# Patient Record
Sex: Female | Born: 1935 | Race: White | Hispanic: No | Smoking: Never smoker
Health system: Southern US, Community
[De-identification: ages and names within clinical notes are randomized; demographics above are authoritative.]

## PROBLEM LIST (undated history)

## (undated) DIAGNOSIS — N289 Disorder of kidney and ureter, unspecified: Secondary | ICD-10-CM

## (undated) DIAGNOSIS — M419 Scoliosis, unspecified: Secondary | ICD-10-CM

## (undated) DIAGNOSIS — Z4689 Encounter for fitting and adjustment of other specified devices: Secondary | ICD-10-CM

## (undated) DIAGNOSIS — D509 Iron deficiency anemia, unspecified: Secondary | ICD-10-CM

## (undated) DIAGNOSIS — E7800 Pure hypercholesterolemia, unspecified: Secondary | ICD-10-CM

## (undated) DIAGNOSIS — E785 Hyperlipidemia, unspecified: Secondary | ICD-10-CM

## (undated) DIAGNOSIS — N816 Rectocele: Secondary | ICD-10-CM

## (undated) DIAGNOSIS — K649 Unspecified hemorrhoids: Secondary | ICD-10-CM

## (undated) DIAGNOSIS — M810 Age-related osteoporosis without current pathological fracture: Secondary | ICD-10-CM

## (undated) DIAGNOSIS — K8020 Calculus of gallbladder without cholecystitis without obstruction: Secondary | ICD-10-CM

## (undated) DIAGNOSIS — I499 Cardiac arrhythmia, unspecified: Secondary | ICD-10-CM

## (undated) DIAGNOSIS — K449 Diaphragmatic hernia without obstruction or gangrene: Secondary | ICD-10-CM

## (undated) DIAGNOSIS — K5790 Diverticulosis of intestine, part unspecified, without perforation or abscess without bleeding: Secondary | ICD-10-CM

## (undated) DIAGNOSIS — M1990 Unspecified osteoarthritis, unspecified site: Secondary | ICD-10-CM

## (undated) DIAGNOSIS — N8110 Cystocele, unspecified: Secondary | ICD-10-CM

## (undated) DIAGNOSIS — E039 Hypothyroidism, unspecified: Secondary | ICD-10-CM

## (undated) DIAGNOSIS — H269 Unspecified cataract: Secondary | ICD-10-CM

## (undated) DIAGNOSIS — N390 Urinary tract infection, site not specified: Secondary | ICD-10-CM

## (undated) DIAGNOSIS — I4891 Unspecified atrial fibrillation: Secondary | ICD-10-CM

## (undated) DIAGNOSIS — J189 Pneumonia, unspecified organism: Secondary | ICD-10-CM

## (undated) DIAGNOSIS — D72829 Elevated white blood cell count, unspecified: Secondary | ICD-10-CM

## (undated) DIAGNOSIS — E875 Hyperkalemia: Secondary | ICD-10-CM

## (undated) DIAGNOSIS — IMO0002 Reserved for concepts with insufficient information to code with codable children: Secondary | ICD-10-CM

## (undated) DIAGNOSIS — M4050 Lordosis, unspecified, site unspecified: Secondary | ICD-10-CM

## (undated) DIAGNOSIS — N189 Chronic kidney disease, unspecified: Secondary | ICD-10-CM

## (undated) HISTORY — DX: Encounter for fitting and adjustment of other specified devices: Z46.89

## (undated) HISTORY — PX: COLONOSCOPY: SHX174

## (undated) HISTORY — DX: Hyperkalemia: E87.5

## (undated) HISTORY — PX: CATARACT EXTRACTION: SUR2

## (undated) HISTORY — DX: Elevated white blood cell count, unspecified: D72.829

## (undated) HISTORY — DX: Rectocele: N81.6

## (undated) HISTORY — DX: Lordosis, unspecified, site unspecified: M40.50

## (undated) HISTORY — DX: Unspecified atrial fibrillation: I48.91

## (undated) HISTORY — PX: ESOPHAGOGASTRODUODENOSCOPY: SHX1529

## (undated) HISTORY — PX: CHOLECYSTECTOMY: SHX55

## (undated) HISTORY — DX: Urinary tract infection, site not specified: N39.0

## (undated) HISTORY — DX: Hyperlipidemia, unspecified: E78.5

## (undated) HISTORY — DX: Reserved for concepts with insufficient information to code with codable children: IMO0002

## (undated) HISTORY — DX: Scoliosis, unspecified: M41.9

---

## 2000-05-11 ENCOUNTER — Encounter (HOSPITAL_COMMUNITY): Admission: RE | Admit: 2000-05-11 | Discharge: 2000-08-09 | Payer: Self-pay | Admitting: Family Medicine

## 2000-06-01 ENCOUNTER — Ambulatory Visit (HOSPITAL_COMMUNITY): Admission: RE | Admit: 2000-06-01 | Discharge: 2000-06-01 | Payer: Self-pay | Admitting: Gastroenterology

## 2000-06-01 ENCOUNTER — Encounter (INDEPENDENT_AMBULATORY_CARE_PROVIDER_SITE_OTHER): Payer: Self-pay | Admitting: Specialist

## 2001-02-16 ENCOUNTER — Encounter: Admission: RE | Admit: 2001-02-16 | Discharge: 2001-02-16 | Payer: Self-pay | Admitting: Family Medicine

## 2001-02-16 ENCOUNTER — Encounter: Payer: Self-pay | Admitting: Family Medicine

## 2001-02-21 ENCOUNTER — Encounter: Payer: Self-pay | Admitting: Family Medicine

## 2001-02-21 ENCOUNTER — Encounter: Admission: RE | Admit: 2001-02-21 | Discharge: 2001-02-21 | Payer: Self-pay | Admitting: Family Medicine

## 2002-01-30 ENCOUNTER — Encounter: Payer: Self-pay | Admitting: Family Medicine

## 2002-01-30 ENCOUNTER — Encounter: Admission: RE | Admit: 2002-01-30 | Discharge: 2002-01-30 | Payer: Self-pay | Admitting: Family Medicine

## 2002-04-03 ENCOUNTER — Other Ambulatory Visit: Admission: RE | Admit: 2002-04-03 | Discharge: 2002-04-03 | Payer: Self-pay | Admitting: Obstetrics and Gynecology

## 2002-04-23 DIAGNOSIS — Z4689 Encounter for fitting and adjustment of other specified devices: Secondary | ICD-10-CM

## 2002-04-23 HISTORY — DX: Encounter for fitting and adjustment of other specified devices: Z46.89

## 2003-03-14 ENCOUNTER — Encounter: Admission: RE | Admit: 2003-03-14 | Discharge: 2003-03-14 | Payer: Self-pay | Admitting: Family Medicine

## 2003-03-14 ENCOUNTER — Encounter: Payer: Self-pay | Admitting: Family Medicine

## 2003-03-29 ENCOUNTER — Observation Stay (HOSPITAL_COMMUNITY): Admission: RE | Admit: 2003-03-29 | Discharge: 2003-03-30 | Payer: Self-pay | Admitting: General Surgery

## 2003-03-29 ENCOUNTER — Encounter: Payer: Self-pay | Admitting: General Surgery

## 2003-03-29 ENCOUNTER — Encounter (INDEPENDENT_AMBULATORY_CARE_PROVIDER_SITE_OTHER): Payer: Self-pay | Admitting: Specialist

## 2004-09-10 ENCOUNTER — Other Ambulatory Visit: Admission: RE | Admit: 2004-09-10 | Discharge: 2004-09-10 | Payer: Self-pay | Admitting: Obstetrics and Gynecology

## 2005-06-14 ENCOUNTER — Other Ambulatory Visit: Admission: RE | Admit: 2005-06-14 | Discharge: 2005-06-14 | Payer: Self-pay | Admitting: Obstetrics and Gynecology

## 2005-07-15 ENCOUNTER — Encounter: Admission: RE | Admit: 2005-07-15 | Discharge: 2005-07-15 | Payer: Self-pay | Admitting: General Surgery

## 2006-01-10 ENCOUNTER — Encounter: Admission: RE | Admit: 2006-01-10 | Discharge: 2006-01-10 | Payer: Self-pay | Admitting: General Surgery

## 2006-06-20 ENCOUNTER — Encounter: Admission: RE | Admit: 2006-06-20 | Discharge: 2006-06-20 | Payer: Self-pay | Admitting: Obstetrics and Gynecology

## 2007-06-22 ENCOUNTER — Encounter: Admission: RE | Admit: 2007-06-22 | Discharge: 2007-06-22 | Payer: Self-pay | Admitting: Obstetrics and Gynecology

## 2007-07-11 ENCOUNTER — Other Ambulatory Visit: Admission: RE | Admit: 2007-07-11 | Discharge: 2007-07-11 | Payer: Self-pay | Admitting: Obstetrics and Gynecology

## 2008-07-29 ENCOUNTER — Encounter: Admission: RE | Admit: 2008-07-29 | Discharge: 2008-07-29 | Payer: Self-pay | Admitting: Obstetrics and Gynecology

## 2009-08-07 ENCOUNTER — Encounter: Admission: RE | Admit: 2009-08-07 | Discharge: 2009-08-07 | Payer: Self-pay | Admitting: Family Medicine

## 2009-09-02 HISTORY — PX: ENDOMETRIAL BIOPSY: SHX622

## 2010-08-28 ENCOUNTER — Telehealth (INDEPENDENT_AMBULATORY_CARE_PROVIDER_SITE_OTHER): Payer: Self-pay | Admitting: *Deleted

## 2010-08-31 ENCOUNTER — Encounter: Payer: Self-pay | Admitting: Internal Medicine

## 2010-08-31 ENCOUNTER — Ambulatory Visit (HOSPITAL_COMMUNITY): Payer: Medicare Other | Attending: Family Medicine

## 2010-08-31 DIAGNOSIS — I491 Atrial premature depolarization: Secondary | ICD-10-CM

## 2010-08-31 DIAGNOSIS — R0609 Other forms of dyspnea: Secondary | ICD-10-CM | POA: Insufficient documentation

## 2010-08-31 DIAGNOSIS — R0602 Shortness of breath: Secondary | ICD-10-CM

## 2010-08-31 DIAGNOSIS — R0989 Other specified symptoms and signs involving the circulatory and respiratory systems: Secondary | ICD-10-CM

## 2010-09-01 NOTE — Progress Notes (Signed)
Summary: Nuclear Pre-Procedure  Phone Note Outgoing Call Call back at [facility] Phone [phone]   Call placed by: Stanton Kidney, EMT-P,  August 28, 2010 11:46 AM Call placed to: Patient Action Taken: Phone Call Completed Summary of Call: Reviewed information on [employer] Information Sheet (see scanned document for further details).  Spoke with the patient. Stanton Kidney, EMT-P  August 28, 2010 11:46 AM      Nuclear Med Background Indications for Stress Test: Evaluation for Ischemia     Symptoms: DOE, Palpitations, Rapid HR, SOB

## 2010-09-10 NOTE — Assessment & Plan Note (Signed)
Summary: Cardiology Nuclear Testing  Nuclear Med Background Indications for Stress Test: Evaluation for Ischemia     Symptoms: DOE, Palpitations, Rapid HR, SOB    Nuclear Pre-Procedure Caffeine/Decaff Intake: None NPO After: 7:00 PM Lungs: clear IV 0.9% NS with Angio Cath: 22g     IV Site: R Wrist IV Started by: Irean Hong, RN Chest Size (in) 40     Cup Size C     Height (in): 63 Weight (lb): 158 BMI: 28.09  Nuclear Med Study 1 or 2 day study:  1 day     Stress Test Type:  Eugenie Birks Reading MD:  Dietrich Pates, MD     Referring MD:  M.Badger Resting Radionuclide:  Technetium 13m Tetrofosmin     Resting Radionuclide Dose:  11.0 mCi  Stress Radionuclide:  Technetium 87m Tetrofosmin     Stress Radionuclide Dose:  33.0 mCi   Stress Protocol  Max Systolic BP: 162 mm Hg Lexiscan: 0.4 mg   Stress Test Technologist:  Milana Na, EMT-P     Nuclear Technologist:  Doyne Keel, CNMT  Rest Procedure  Myocardial perfusion imaging was performed at rest 45 minutes following the intravenous administration of Technetium 26m Tetrofosmin.  Stress Procedure  The patient received IV Lexiscan 0.4 mg over 15-seconds.  Technetium 103m Tetrofosmin injected at 30-seconds.  There were no significant changes with infusion.  Quantitative spect images were obtained after a 45 minute delay.  QPS Raw Data Images:  Soft tissue (breast, diaphragm) surround heart. Stress Images:  Normal perfusion with minimal apical thinning. Rest Images:  No significant change from the stress images. Subtraction (SDS):  No evidence of ischemia. Transient Ischemic Dilatation:  0.96  (Normal <1.22)  Lung/Heart Ratio:  0.40  (Normal <0.45)  Quantitative Gated Spect Images QGS EDV:  62 ml QGS ESV:  15 ml QGS EF:  76 %   Overall Impression  Exercise Capacity: Lexiscan with low level exercise BP Response: Normal blood pressure response. Clinical Symptoms: No chest pain ECG Impression: No significant ST segment  change suggestive of ischemia. Overall Impression: Normal stress nuclear study.

## 2010-09-18 ENCOUNTER — Other Ambulatory Visit: Payer: Self-pay | Admitting: Obstetrics and Gynecology

## 2010-09-18 DIAGNOSIS — Z780 Asymptomatic menopausal state: Secondary | ICD-10-CM

## 2010-09-18 DIAGNOSIS — Z1231 Encounter for screening mammogram for malignant neoplasm of breast: Secondary | ICD-10-CM

## 2010-10-01 ENCOUNTER — Ambulatory Visit
Admission: RE | Admit: 2010-10-01 | Discharge: 2010-10-01 | Disposition: A | Discharge status: Home / Self Care | Payer: Medicare Other | Source: Ambulatory Visit | Attending: Obstetrics and Gynecology | Admitting: Obstetrics and Gynecology

## 2010-10-01 DIAGNOSIS — Z780 Asymptomatic menopausal state: Secondary | ICD-10-CM

## 2010-10-01 DIAGNOSIS — Z1231 Encounter for screening mammogram for malignant neoplasm of breast: Secondary | ICD-10-CM

## 2010-11-20 NOTE — Op Note (Signed)
[facility]  Patient:    Carla [name], Carla [name]                      MRN: [id number] Proc. Date: 06/01/00 Adm. Date:  [id number] Attending:  Valera Castle CC:         Teena Irani. Arlyce Dice, M.D.   Operative Report  PROCEDURE:  Esophagogastroduodenoscopy.  ENDOSCOPIST:  Everardo All. Madilyn Fireman, M.D.  INDICATIONS FOR PROCEDURE:  Severe iron-deficiency anemia with colonoscopy prior to this procedure revealing no obvious source of occult blood loss.  DESCRIPTION OF PROCEDURE:   The patient was placed in the left lateral decubitus position and placed on the pulse monitor with continuous low-flow oxygen delivered by nasal cannula.  She was sedated with 1 mg IV Versed and 10 mg IV Demerol in addition to the 60 mg IV Demerol and 6 mg of IV Versed given for the previous colonoscopy.  The Olympus video endoscope was advanced under direct vision into the oropharynx and esophagus.  The esophagus was of normal caliber but somewhat tortuous wit the squamocolumnar line at 33 cm.  There appeared to be a subtle low esophageal ring with no visible erosions, ulcers, or any evidence of neoplasm. There was no visible esophagitis.  The squamocolumnar line appeared to be at the level of the gastroesophageal junction.  The stomach was entered, and there was a fixed 5 cm hiatal hernia with some linear erosions within the hernia sac and more prominent erosions with some exudate just distal to the hernia.  The intra-abdominal stomach otherwise appeared normal.   Retroflexed view of the cardia confirmed the large hiatal hernia.  There were erosions in the body as mentioned.  The antrum appeared relatively normal proximally, but the distal antrum showed some erythema and granularity.  A CLOtest was obtained.  The pylorus was not deformed and easily allowed passage of the endoscope tip into the duodenum. Both the bulb and second portion were well inspected and appeared to be within normal limits.   Small-bowel biopsies were obtained to rule out celiac disease. The endoscope was then withdrawn, and the patient returned to the recovery room in stable condition.  She tolerated the procedure well, and there were no immediate complications.  IMPRESSION: 1. Large hiatal hernia. 2. Gastritis within the body and within the hiatal hernia sac.  PLAN:  Await CLOtest and small-bowel biopsies. DD:  06/01/00 TD:  06/01/00 Job: [id number]

## 2010-11-20 NOTE — Op Note (Signed)
NAME:  Carla [name], Carla [name] NO.:  [id number]   MEDICAL RECORD NO.:  [id number]                   PATIENT TYPE:  AMB   LOCATION:  DAY                                  FACILITY:  [facility]   PHYSICIAN:  Angelia Mould. Derrell Lolling, M.D.             DATE OF BIRTH:  1922/04/04   DATE OF PROCEDURE:  03/29/2003  DATE OF DISCHARGE:                                 OPERATIVE REPORT   PREOPERATIVE DIAGNOSIS:  Chronic cholecystitis with cholelithiasis.   POSTOPERATIVE DIAGNOSIS:  Chronic cholecystitis with cholelithiasis.   OPERATION:  Laparoscopic cholecystectomy with intraoperative cholangiogram.   SURGEON:  Angelia Mould. Derrell Lolling, M.D.   FIRST ASSISTANT:  Timothy E. Earlene Plater, M.D.   INDICATIONS FOR PROCEDURE:  This is a 77 year old white female in excellent  health. The past two months she has been having intermittent attacks of  nausea, vomiting, and upper abdominal pain. She has been on a diet and has  intentionally lost 40 pounds over the past nine months. She recently had an  ultrasound which shows a thick walled gallbladder and a small amount of  fluid around the gallbladder and mild elevation of her liver function tests.  Her symptoms subsided. I scheduled her for this surgery electively.   FINDINGS:  The gallbladder was chronically inflamed, slightly thick walled.  The liver looked normal. The anatomy of the cystic duct, cystic artery and  common bile duct were normal and conventional pattern. The cholangiogram was  normal, showing normal intrahepatic and extrahepatic bile ducts, no filling  defect, and prompt flow of contrast into the duodenum. The small intestine  and large intestine, stomach and duodenum, and peritoneal surfaces were  normal.   TECHNIQUE:  Following the induction of general endotracheal anesthesia, the  patient's abdomen was prepped and draped in a sterile fashion. 0.5% Marcaine  with epinephrine was used as a local infiltration anesthetic. A  transverse  incision was made at the lower rim of the umbilicus. The fascia was incised  in the midline and the abdominal cavity entered under direct vision. A 10 mm  Hasson trocar was inserted and secured with a pursestring suture of #0  Vicryl. Pneumoperitoneum was created. The video camera was inserted with  visualization and findings as described above. A 10 mm trocar was placed in  the subxiphoid region and two 5 mm trocars placed in the right mid abdomen.  The gallbladder was elevated and placed on traction. We exposed the  infundibulum of the gallbladder. We dissected out the cystic duct and the  cystic artery. The cystic artery was isolated, secured with metal clips and  divided close to where it went onto the gallbladder wall. The cystic duct  was isolated. We could identify the junction of the cystic duct with the  gallbladder and we identified the common bile duct as well. A large window  was created behind the cystic duct. A metal clip was placed on the cystic  duct close to the gallbladder. A cholangiogram catheter was inserted into  the cystic duct. A cholangiogram was obtained using a C-arm. This showed  normal intrahepatic and extrahepatic bile ducts. There was no filling defect  and the dye flowed into the duodenum without any obstruction. The  cholangiogram catheter was removed. The cystic duct was secured with metal  clips and divided. The gallbladder was dissected from its bed with  electrocautery and removed.   The operative field was copiously irrigated with saline. The irrigation  fluid was completely clear at the end of the case. There was no bleeding and  no bile leak whatsoever. The trocars were removed under direct vision and  there was no bleeding from the trocar sites. The pneumoperitoneum was  released. The fascia at the umbilicus was closed with #0 Vicryl sutures. The  skin incisions were closed with subcuticular sutures of 4-0 Vicryl and Steri-  Strips.  Clean bandages were placed and the patient taken to the recovery  room in stable condition. Estimated blood loss was about 10 mL.  Complications none. Sponge, needle and instrument counts were correct.                                               Angelia Mould. Derrell Lolling, M.D.    HMI/MEDQ  D:  03/29/2003  T:  03/30/2003  Job:  [id number]   cc:   Teena Irani. Arlyce Dice, M.D.  [address]  [place]  [place] [address]  Fax: [phone]

## 2010-11-20 NOTE — Op Note (Signed)
[facility]  Patient:    Carla [name], Carla [name]                      MRN: [id number] Proc. Date: 06/01/00 Adm. Date:  [id number] Attending:  Valera Castle CC:         Teena Irani. Arlyce Dice, M.D.   Operative Report  PROCEDURE:  Colonoscopy.  GASTROENTEROLOGIST:  Everardo All. Madilyn Fireman, M.D.  INDICATIONS FOR PROCEDURE:  Severe iron-deficiency anemia with hemoglobin less than 6 requiring transfusion.  DESCRIPTION OF PROCEDURE:   The patient was placed in the left lateral decubitus position and placed on the pulse monitor with continuous low-flow oxygen delivered by nasal cannula.  She was sedated with 60 mg IV Demerol and 6 mg IV Versed.  The Olympus video colonoscope was inserted into the rectum and advanced to the cecum, confirmed by transillumination of McBurneys point and visualization of the ileocecal valve and appendiceal orifice.  The prep was excellent.  The cecum appeared normal.  In the ascending, transverse, descending, and sigmoid colon was seen extensive diverticulosis, some quite large, with no other abnormalities noted.  No polyps or mucosal abnormalities were seen.  The rectum appeared normal except for some enlarged internal hemorrhoids seen on retroflex view.  The colonoscope was then withdrawn, and the patient returned to the recovery room in stable condition.  She tolerated the procedure well, and there were no immediate complications.  IMPRESSIONS: 1. Extensive pancolonic diverticulosis. 2. Enlarge internal hemorrhoids.  PLAN:  Will proceed with EGD.  If no significant bleeding lesion found, will pursue small-bowel biopsy to rule out a sprue-like lesion. DD:  06/01/00 TD:  06/01/00 Job: [id number]

## 2011-09-23 DIAGNOSIS — E785 Hyperlipidemia, unspecified: Secondary | ICD-10-CM | POA: Insufficient documentation

## 2011-09-25 ENCOUNTER — Emergency Department (HOSPITAL_COMMUNITY)
Admission: EM | Admit: 2011-09-25 | Discharge: 2011-09-25 | Disposition: A | Discharge status: Home / Self Care | Payer: Medicare Other | Attending: Emergency Medicine | Admitting: Emergency Medicine

## 2011-09-25 ENCOUNTER — Encounter (HOSPITAL_COMMUNITY): Payer: Self-pay

## 2011-09-25 DIAGNOSIS — Z0189 Encounter for other specified special examinations: Secondary | ICD-10-CM

## 2011-09-25 DIAGNOSIS — E875 Hyperkalemia: Secondary | ICD-10-CM | POA: Insufficient documentation

## 2011-09-25 DIAGNOSIS — Z8639 Personal history of other endocrine, nutritional and metabolic disease: Secondary | ICD-10-CM

## 2011-09-25 LAB — BASIC METABOLIC PANEL
BUN: 21 mg/dL (ref 6–23)
Creatinine, Ser: 0.96 mg/dL (ref 0.50–1.10)
GFR calc Af Amer: 60 mL/min — ABNORMAL LOW (ref >90)
GFR calc non Af Amer: 52 mL/min — ABNORMAL LOW (ref >90)
Glucose, Bld: 113 mg/dL — ABNORMAL HIGH (ref 70–99)

## 2011-09-25 NOTE — ED Provider Notes (Signed)
History     CSN: [id number]  Arrival date & time 09/25/11  0720   First MD Initiated Contact with Patient 09/25/11 [phone]      No chief complaint on file.   (Consider location/radiation/quality/duration/timing/severity/associated sxs/prior treatment) HPI And patient seen by Dr.Badger office 2 days ago for checkup. Was called this morning, told to come to the emergency department as she was noted to have high potassium Patient is asymptomatic. No treatment prior to coming here. No past medical history on file. Past medical history arthritis No past surgical history on file. Cholecystectomy No family history on file.  History  Substance Use Topics  . Smoking status: Not on file  . Smokeless tobacco: Not on file  . Alcohol Use: Not on file   No tobacco no alcohol no drugs OB History    No data available      Review of Systems  Constitutional: Negative.   HENT: Negative.   Respiratory: Negative.   Cardiovascular: Negative.   Gastrointestinal: Negative.   Musculoskeletal: Negative.   Skin: Negative.   Neurological: Negative.   Hematological: Negative.   Psychiatric/Behavioral: Negative.   All other systems reviewed and are negative.    Allergies  Review of patient's allergies indicates not on file.  Home Medications  No current outpatient prescriptions on file.  There were no vitals taken for this visit.  Physical Exam  Nursing note and vitals reviewed. Constitutional: She appears well-developed and well-nourished. No distress.  HENT:  Head: Normocephalic and atraumatic.  Eyes: Conjunctivae are normal. Pupils are equal, round, and reactive to light.  Neck: Neck supple. No tracheal deviation present. No thyromegaly present.  Cardiovascular: Normal rate and regular rhythm.   No murmur heard. Pulmonary/Chest: Effort normal and breath sounds normal.  Abdominal: Soft. Bowel sounds are normal. She exhibits no distension. There is no tenderness.  Musculoskeletal:  Normal range of motion. She exhibits no edema and no tenderness.  Neurological: She is alert. Coordination normal.  Skin: Skin is warm and dry. No rash noted.  Psychiatric: She has a normal mood and affect.    ED Course  Procedures (including critical care time)  Labs Reviewed - No data to display No results found. Cardiac monitor shows sinus arrhythmia. Twaves normal appearing  No diagnosis found. Results for orders placed during the hospital encounter of 09/25/11  BASIC METABOLIC PANEL      Component Value Range   Sodium 137  135 - 145 (mEq/L)   Potassium 4.1  3.5 - 5.1 (mEq/L)   Chloride 100  96 - 112 (mEq/L)   CO2 27  19 - 32 (mEq/L)   Glucose, Bld 113 (*) 70 - 99 (mg/dL)   BUN 21  6 - 23 (mg/dL)   Creatinine, Ser 4.78  0.50 - 1.10 (mg/dL)   Calcium 9.1  8.4 - 29.5 (mg/dL)   GFR calc non Af Amer 52 (*) >90 (mL/min)   GFR calc Af Amer 60 (*) >90 (mL/min)   No results found.   9 AM patient remains asymptomatic resting comfortably MDM  Elevated K at pmds office likely factitious No further tx  Or eval needed   Diagnosis:   Hx of hyperkalemia   Doug Sou, MD 09/25/11 [phone]

## 2011-09-25 NOTE — Discharge Instructions (Signed)
Your blood potassium collected here today was 4.1, which is normal. Return if your condition worsens for any reason. Please notify Dr. Cyndia Bent of today's result

## 2011-09-25 NOTE — ED Notes (Signed)
Pt was referred here by PCP for hyperkalemia per pcp lab value. Sts it was 6.6

## 2011-09-30 DIAGNOSIS — D649 Anemia, unspecified: Secondary | ICD-10-CM | POA: Insufficient documentation

## 2012-01-19 DIAGNOSIS — M549 Dorsalgia, unspecified: Secondary | ICD-10-CM | POA: Insufficient documentation

## 2012-01-19 DIAGNOSIS — M419 Scoliosis, unspecified: Secondary | ICD-10-CM | POA: Insufficient documentation

## 2012-05-02 LAB — HM PAP SMEAR: HM Pap smear: NEGATIVE

## 2012-08-10 ENCOUNTER — Other Ambulatory Visit: Payer: Self-pay | Admitting: Obstetrics and Gynecology

## 2012-08-10 DIAGNOSIS — Z1231 Encounter for screening mammogram for malignant neoplasm of breast: Secondary | ICD-10-CM

## 2012-08-19 ENCOUNTER — Inpatient Hospital Stay (HOSPITAL_COMMUNITY): Payer: Medicare Other

## 2012-08-19 ENCOUNTER — Encounter (HOSPITAL_COMMUNITY): Payer: Self-pay | Admitting: Emergency Medicine

## 2012-08-19 ENCOUNTER — Emergency Department (HOSPITAL_COMMUNITY): Payer: Medicare Other

## 2012-08-19 ENCOUNTER — Inpatient Hospital Stay (HOSPITAL_COMMUNITY)
Admission: EM | Admit: 2012-08-19 | Discharge: 2012-08-27 | DRG: 871 | Disposition: A | Discharge status: Home / Self Care | Payer: Medicare Other | Attending: Internal Medicine | Admitting: Internal Medicine

## 2012-08-19 DIAGNOSIS — K529 Noninfective gastroenteritis and colitis, unspecified: Secondary | ICD-10-CM

## 2012-08-19 DIAGNOSIS — K449 Diaphragmatic hernia without obstruction or gangrene: Secondary | ICD-10-CM | POA: Diagnosis present

## 2012-08-19 DIAGNOSIS — I5031 Acute diastolic (congestive) heart failure: Secondary | ICD-10-CM | POA: Diagnosis present

## 2012-08-19 DIAGNOSIS — I959 Hypotension, unspecified: Secondary | ICD-10-CM

## 2012-08-19 DIAGNOSIS — K5730 Diverticulosis of large intestine without perforation or abscess without bleeding: Secondary | ICD-10-CM

## 2012-08-19 DIAGNOSIS — R1115 Cyclical vomiting syndrome unrelated to migraine: Secondary | ICD-10-CM

## 2012-08-19 DIAGNOSIS — N179 Acute kidney failure, unspecified: Secondary | ICD-10-CM

## 2012-08-19 DIAGNOSIS — R7401 Elevation of levels of liver transaminase levels: Secondary | ICD-10-CM | POA: Diagnosis present

## 2012-08-19 DIAGNOSIS — E7800 Pure hypercholesterolemia, unspecified: Secondary | ICD-10-CM

## 2012-08-19 DIAGNOSIS — I4891 Unspecified atrial fibrillation: Secondary | ICD-10-CM | POA: Diagnosis present

## 2012-08-19 DIAGNOSIS — J189 Pneumonia, unspecified organism: Secondary | ICD-10-CM | POA: Diagnosis present

## 2012-08-19 DIAGNOSIS — N390 Urinary tract infection, site not specified: Secondary | ICD-10-CM | POA: Diagnosis present

## 2012-08-19 DIAGNOSIS — R571 Hypovolemic shock: Secondary | ICD-10-CM

## 2012-08-19 DIAGNOSIS — R0902 Hypoxemia: Secondary | ICD-10-CM | POA: Diagnosis present

## 2012-08-19 DIAGNOSIS — M1990 Unspecified osteoarthritis, unspecified site: Secondary | ICD-10-CM | POA: Insufficient documentation

## 2012-08-19 DIAGNOSIS — B3789 Other sites of candidiasis: Secondary | ICD-10-CM | POA: Diagnosis present

## 2012-08-19 DIAGNOSIS — A4150 Gram-negative sepsis, unspecified: Secondary | ICD-10-CM

## 2012-08-19 DIAGNOSIS — I509 Heart failure, unspecified: Secondary | ICD-10-CM | POA: Diagnosis present

## 2012-08-19 DIAGNOSIS — I951 Orthostatic hypotension: Secondary | ICD-10-CM

## 2012-08-19 DIAGNOSIS — R748 Abnormal levels of other serum enzymes: Secondary | ICD-10-CM | POA: Diagnosis present

## 2012-08-19 DIAGNOSIS — J9600 Acute respiratory failure, unspecified whether with hypoxia or hypercapnia: Secondary | ICD-10-CM | POA: Diagnosis present

## 2012-08-19 DIAGNOSIS — R112 Nausea with vomiting, unspecified: Secondary | ICD-10-CM

## 2012-08-19 DIAGNOSIS — E860 Dehydration: Secondary | ICD-10-CM

## 2012-08-19 DIAGNOSIS — K920 Hematemesis: Secondary | ICD-10-CM

## 2012-08-19 DIAGNOSIS — R7989 Other specified abnormal findings of blood chemistry: Secondary | ICD-10-CM

## 2012-08-19 DIAGNOSIS — K5790 Diverticulosis of intestine, part unspecified, without perforation or abscess without bleeding: Secondary | ICD-10-CM | POA: Insufficient documentation

## 2012-08-19 DIAGNOSIS — R7402 Elevation of levels of lactic acid dehydrogenase (LDH): Secondary | ICD-10-CM | POA: Diagnosis present

## 2012-08-19 DIAGNOSIS — A4151 Sepsis due to Escherichia coli [E. coli]: Principal | ICD-10-CM | POA: Diagnosis present

## 2012-08-19 DIAGNOSIS — R6520 Severe sepsis without septic shock: Secondary | ICD-10-CM | POA: Diagnosis present

## 2012-08-19 DIAGNOSIS — E669 Obesity, unspecified: Secondary | ICD-10-CM | POA: Diagnosis present

## 2012-08-19 DIAGNOSIS — D509 Iron deficiency anemia, unspecified: Secondary | ICD-10-CM | POA: Diagnosis present

## 2012-08-19 DIAGNOSIS — A419 Sepsis, unspecified organism: Secondary | ICD-10-CM | POA: Diagnosis present

## 2012-08-19 DIAGNOSIS — M810 Age-related osteoporosis without current pathological fracture: Secondary | ICD-10-CM | POA: Insufficient documentation

## 2012-08-19 DIAGNOSIS — K5289 Other specified noninfective gastroenteritis and colitis: Secondary | ICD-10-CM

## 2012-08-19 DIAGNOSIS — R5381 Other malaise: Secondary | ICD-10-CM | POA: Diagnosis present

## 2012-08-19 DIAGNOSIS — E876 Hypokalemia: Secondary | ICD-10-CM

## 2012-08-19 DIAGNOSIS — K649 Unspecified hemorrhoids: Secondary | ICD-10-CM | POA: Diagnosis present

## 2012-08-19 DIAGNOSIS — R578 Other shock: Secondary | ICD-10-CM

## 2012-08-19 DIAGNOSIS — D72829 Elevated white blood cell count, unspecified: Secondary | ICD-10-CM | POA: Diagnosis present

## 2012-08-19 DIAGNOSIS — N8110 Cystocele, unspecified: Secondary | ICD-10-CM

## 2012-08-19 DIAGNOSIS — Z6831 Body mass index (BMI) 31.0-31.9, adult: Secondary | ICD-10-CM

## 2012-08-19 HISTORY — DX: Unspecified hemorrhoids: K64.9

## 2012-08-19 HISTORY — DX: Diaphragmatic hernia without obstruction or gangrene: K44.9

## 2012-08-19 HISTORY — DX: Iron deficiency anemia, unspecified: D50.9

## 2012-08-19 HISTORY — DX: Pure hypercholesterolemia, unspecified: E78.00

## 2012-08-19 HISTORY — DX: Diverticulosis of intestine, part unspecified, without perforation or abscess without bleeding: K57.90

## 2012-08-19 HISTORY — DX: Calculus of gallbladder without cholecystitis without obstruction: K80.20

## 2012-08-19 HISTORY — DX: Age-related osteoporosis without current pathological fracture: M81.0

## 2012-08-19 HISTORY — DX: Unspecified cataract: H26.9

## 2012-08-19 HISTORY — DX: Cystocele, unspecified: N81.10

## 2012-08-19 HISTORY — DX: Unspecified osteoarthritis, unspecified site: M19.90

## 2012-08-19 LAB — URINALYSIS, ROUTINE W REFLEX MICROSCOPIC
Protein, ur: 30 mg/dL — AB
Urobilinogen, UA: 1 mg/dL (ref 0.0–1.0)

## 2012-08-19 LAB — URINE MICROSCOPIC-ADD ON

## 2012-08-19 LAB — CBC
Hemoglobin: 13.5 g/dL (ref 12.0–15.0)
MCH: 30.1 pg (ref 26.0–34.0)
MCH: 29.1 pg (ref 26.0–34.0)
MCHC: 32.8 g/dL (ref 30.0–36.0)
MCV: 88.6 fL (ref 78.0–100.0)
Platelets: 332 10*3/uL (ref 150–400)
RBC: 5.54 MIL/uL — ABNORMAL HIGH (ref 3.87–5.11)
RDW: 14.5 % (ref 11.5–15.5)
WBC: 27.6 10*3/uL — ABNORMAL HIGH (ref 4.0–10.5)

## 2012-08-19 LAB — BASIC METABOLIC PANEL
BUN: 73 mg/dL — ABNORMAL HIGH (ref 6–23)
CO2: 26 mEq/L (ref 19–32)
Calcium: 7.6 mg/dL — ABNORMAL LOW (ref 8.4–10.5)
Chloride: 95 mEq/L — ABNORMAL LOW (ref 96–112)
Creatinine, Ser: 3.59 mg/dL — ABNORMAL HIGH (ref 0.50–1.10)
Glucose, Bld: 153 mg/dL — ABNORMAL HIGH (ref 70–99)

## 2012-08-19 LAB — COMPREHENSIVE METABOLIC PANEL
ALT: 20 U/L (ref 0–35)
AST: 39 U/L — ABNORMAL HIGH (ref 0–37)
Albumin: 3.4 g/dL — ABNORMAL LOW (ref 3.5–5.2)
CO2: 24 mEq/L (ref 19–32)
Calcium: 9.3 mg/dL (ref 8.4–10.5)
Creatinine, Ser: 3.85 mg/dL — ABNORMAL HIGH (ref 0.50–1.10)
GFR calc non Af Amer: 10 mL/min — ABNORMAL LOW (ref >90)
Sodium: 138 mEq/L (ref 135–145)

## 2012-08-19 LAB — DIFFERENTIAL
Basophils Relative: 0 % (ref 0–1)
Eosinophils Absolute: 0 10*3/uL (ref 0.0–0.7)
Lymphocytes Relative: 6 % — ABNORMAL LOW (ref 12–46)
Lymphs Abs: 1.7 10*3/uL (ref 0.7–4.0)
Monocytes Relative: 12 % (ref 3–12)
Neutro Abs: 22.6 10*3/uL — ABNORMAL HIGH (ref 1.7–7.7)

## 2012-08-19 LAB — OCCULT BLOOD GASTRIC / DUODENUM (SPECIMEN CUP)
Occult Blood, Gastric: POSITIVE — AB
pH, Gastric: 4

## 2012-08-19 LAB — PROCALCITONIN: Procalcitonin: 1.2 ng/mL

## 2012-08-19 LAB — TROPONIN I
Troponin I: 0.47 ng/mL (ref <0.30)
Troponin I: 0.34 ng/mL (ref <0.30)

## 2012-08-19 LAB — LACTIC ACID, PLASMA
Lactic Acid, Venous: 6.1 mmol/L — ABNORMAL HIGH (ref 0.5–2.2)
Lactic Acid, Venous: 1.6 mmol/L (ref 0.5–2.2)

## 2012-08-19 MED ORDER — POTASSIUM CHLORIDE 10 MEQ/100ML IV SOLN
10.0000 meq | INTRAVENOUS | Status: AC
  Administered 2012-08-19 (×6): 10 meq via INTRAVENOUS
  Filled 2012-08-19: qty 500
  Filled 2012-08-19: qty 100

## 2012-08-19 MED ORDER — ESTROGENS, CONJUGATED 0.625 MG/GM VA CREA: 1.0000 g | TOPICAL_CREAM | VAGINAL | Status: DC

## 2012-08-19 MED ORDER — BIOTENE DRY MOUTH MT LIQD
15.0000 mL | Freq: Two times a day (BID) | OROMUCOSAL | Status: DC
  Administered 2012-08-20 – 2012-08-27 (×13): 15 mL via OROMUCOSAL

## 2012-08-19 MED ORDER — SODIUM CHLORIDE 0.9 % IJ SOLN
3.0000 mL | Freq: Two times a day (BID) | INTRAMUSCULAR | Status: DC
  Administered 2012-08-20 – 2012-08-21 (×3): 3 mL via INTRAVENOUS
  Administered 2012-08-21: 21:00:00 via INTRAVENOUS
  Administered 2012-08-22 – 2012-08-27 (×5): 3 mL via INTRAVENOUS

## 2012-08-19 MED ORDER — SODIUM CHLORIDE 0.9 % IV BOLUS (SEPSIS)
1000.0000 mL | Freq: Once | INTRAVENOUS | Status: AC
  Administered 2012-08-19: 1000 mL via INTRAVENOUS

## 2012-08-19 MED ORDER — IOHEXOL 300 MG/ML  SOLN
50.0000 mL | Freq: Once | INTRAMUSCULAR | Status: AC | PRN
  Administered 2012-08-19: 50 mL via ORAL

## 2012-08-19 MED ORDER — SODIUM CHLORIDE 0.9 % IV SOLN
8.0000 mg/h | INTRAVENOUS | Status: DC
  Administered 2012-08-19 – 2012-08-21 (×4): 8 mg/h via INTRAVENOUS
  Filled 2012-08-19 (×9): qty 80

## 2012-08-19 MED ORDER — ONDANSETRON HCL 4 MG/2ML IJ SOLN
4.0000 mg | Freq: Once | INTRAMUSCULAR | Status: AC
  Administered 2012-08-19: 4 mg via INTRAVENOUS
  Filled 2012-08-19: qty 2

## 2012-08-19 MED ORDER — MORPHINE SULFATE 2 MG/ML IJ SOLN
1.0000 mg | INTRAMUSCULAR | Status: DC | PRN
  Administered 2012-08-19: 2 mg via INTRAVENOUS
  Administered 2012-08-20: 1 mg via INTRAVENOUS
  Filled 2012-08-19 (×2): qty 1

## 2012-08-19 MED ORDER — ESTRADIOL 0.1 MG/GM VA CREA
1.0000 | TOPICAL_CREAM | VAGINAL | Status: DC
  Administered 2012-08-21 – 2012-08-25 (×3): 1 via VAGINAL
  Filled 2012-08-19: qty 42.5

## 2012-08-19 MED ORDER — PIPERACILLIN-TAZOBACTAM IN DEX 2-0.25 GM/50ML IV SOLN
2.2500 g | Freq: Three times a day (TID) | INTRAVENOUS | Status: DC
  Administered 2012-08-19 – 2012-08-23 (×12): 2.25 g via INTRAVENOUS
  Filled 2012-08-19 (×14): qty 50

## 2012-08-19 MED ORDER — PIPERACILLIN-TAZOBACTAM 3.375 G IVPB
3.3750 g | Freq: Once | INTRAVENOUS | Status: DC
  Filled 2012-08-19: qty 50

## 2012-08-19 MED ORDER — SODIUM CHLORIDE 0.9 % IV SOLN
80.0000 mg | Freq: Once | INTRAVENOUS | Status: AC
  Administered 2012-08-19: 80 mg via INTRAVENOUS
  Filled 2012-08-19: qty 80

## 2012-08-19 MED ORDER — FENTANYL CITRATE 0.05 MG/ML IJ SOLN
12.5000 ug | Freq: Once | INTRAMUSCULAR | Status: AC
  Administered 2012-08-19: 12.5 ug via INTRAVENOUS
  Filled 2012-08-19: qty 2

## 2012-08-19 MED ORDER — PROMETHAZINE HCL 25 MG/ML IJ SOLN
12.5000 mg | Freq: Four times a day (QID) | INTRAMUSCULAR | Status: DC | PRN
  Administered 2012-08-20: 12.5 mg via INTRAVENOUS
  Filled 2012-08-19: qty 1

## 2012-08-19 MED ORDER — ACETAMINOPHEN 325 MG PO TABS
650.0000 mg | ORAL_TABLET | Freq: Four times a day (QID) | ORAL | Status: DC | PRN
  Administered 2012-08-26: 650 mg via ORAL
  Filled 2012-08-19: qty 2

## 2012-08-19 MED ORDER — DILTIAZEM LOAD VIA INFUSION: 10.0000 mg | Freq: Once | INTRAVENOUS | Status: DC

## 2012-08-19 MED ORDER — DILTIAZEM HCL 100 MG IV SOLR: 5.0000 mg/h | INTRAVENOUS | Status: DC

## 2012-08-19 MED ORDER — METOCLOPRAMIDE HCL 5 MG/ML IJ SOLN
10.0000 mg | Freq: Once | INTRAMUSCULAR | Status: AC
  Administered 2012-08-19: 10 mg via INTRAVENOUS
  Filled 2012-08-19: qty 2

## 2012-08-19 MED ORDER — ONDANSETRON 8 MG/NS 50 ML IVPB
8.0000 mg | Freq: Four times a day (QID) | INTRAVENOUS | Status: DC
  Administered 2012-08-19 – 2012-08-22 (×11): 8 mg via INTRAVENOUS
  Filled 2012-08-19 (×13): qty 8

## 2012-08-19 MED ORDER — ACETAMINOPHEN 650 MG RE SUPP: 650.0000 mg | Freq: Four times a day (QID) | RECTAL | Status: DC | PRN

## 2012-08-19 MED ORDER — SODIUM CHLORIDE 0.9 % IV SOLN
INTRAVENOUS | Status: DC
  Administered 2012-08-19 – 2012-08-20 (×2): via INTRAVENOUS

## 2012-08-19 MED ORDER — SODIUM CHLORIDE 0.9 % IV BOLUS (SEPSIS)
1750.0000 mL | Freq: Once | INTRAVENOUS | Status: AC
  Administered 2012-08-19: 1000 mL via INTRAVENOUS

## 2012-08-19 MED ORDER — SODIUM CHLORIDE 0.9 % IV SOLN
Freq: Once | INTRAVENOUS | Status: AC
  Administered 2012-08-19: 12:00:00 via INTRAVENOUS

## 2012-08-19 NOTE — Progress Notes (Signed)
ANTIBIOTIC CONSULT NOTE - INITIAL  Pharmacy Consult for Zosyn Indication: Diverticulitis  No Known Allergies  Patient Measurements: Height: 5\' 2"  (157.5 cm) Weight: 150 lb 9.2 oz (68.3 kg) IBW/kg (Calculated) : 50.1  Vital Signs: Temp: 97.8 F (36.6 C) (02/15 1124) Temp src: Rectal (02/15 1124) BP: 112/82 mmHg (02/15 1500) Pulse Rate: 125 (02/15 1500) Intake/Output from previous day:   Intake/Output from this shift: Total I/O In: 100 [IV Piggyback:100] Out: -   Labs:  Recent Labs  08/19/12 1034  WBC 27.6*  HGB 16.7*  PLT 332  CREATININE 3.85*   Estimated Creatinine Clearance: 9.2 ml/min (by C-G formula based on Cr of 3.85). No results found for this basename: VANCOTROUGH, VANCOPEAK, VANCORANDOM, GENTTROUGH, GENTPEAK, GENTRANDOM, TOBRATROUGH, TOBRAPEAK, TOBRARND, AMIKACINPEAK, AMIKACINTROU, AMIKACIN,  in the last 72 hours   Microbiology: No results found for this or any previous visit (from the past 720 hour(s)).  Medical History: Past Medical History  Diagnosis Date  . Iron deficiency anemia   . High cholesterol   . Osteoarthritis   . Female bladder prolapse   . Osteoporosis   . Cataracts, bilateral   . Gallstones   . Hemorrhoids   . Diverticulosis   . Hiatal hernia     Assessment:  89 yof presented 2/15 with 5 days of severe N/V.  Found hypotensive and tachycardic in the ED.  MD ordered for Zosyn for mild diverticulitis plus for r/o sepsis.  Scr 3.85 for CG CrCl 9 ml/min.  Zosyn 3.375 gm IV x 1 ordered but not yet given.  Afeb, WBC 27.6K   Plan:   Zosyn 2.25 gm IV q8h  Pharmacy will f/u  Geoffry Paradise, PharmD, BCPS Pager: [phone] 4:09 PM Pharmacy #: [phone]

## 2012-08-19 NOTE — ED Provider Notes (Signed)
History     CSN: [id number]  Arrival date & time 08/19/12  9147   First MD Initiated Contact with Patient 08/19/12 1011      Chief Complaint  Patient presents with  . Nausea  . Emesis  . Diarrhea    (Consider location/radiation/quality/duration/timing/severity/associated sxs/prior treatment) Patient is a 79 y.o. female presenting with vomiting and diarrhea. The history is provided by the patient. No language interpreter was used.  Emesis Severity:  Severe Duration:  5 days Timing:  Intermittent Number of daily episodes:  TNTC Feeding tolerance: minimal PO intake over the last 48 hours. Progression:  Worsening Chronicity:  New Recent urination:  Normal Associated symptoms: abdominal pain (mild cramping), chills, cough and diarrhea   Associated symptoms: no arthralgias, no fever, no headaches, no myalgias, no sore throat and no URI   Risk factors: no alcohol use, no diabetes, no sick contacts, no suspect food intake and no travel to endemic areas   Diarrhea Associated symptoms: abdominal pain (mild cramping), chills, cough and vomiting   Associated symptoms: no arthralgias, no diaphoresis, no fever, no headaches, no myalgias and no URI     History reviewed. No pertinent past medical history.  Past Surgical History  Procedure Laterality Date  . Cholecystectomy      History reviewed. No pertinent family history.  History  Substance Use Topics  . Smoking status: Never Smoker   . Smokeless tobacco: Not on file  . Alcohol Use: No    OB History   Grav Para Term Preterm Abortions TAB SAB Ect Mult Living                  Review of Systems  Constitutional: Positive for chills, activity change, appetite change and fatigue. Negative for fever and diaphoresis.  HENT: Negative for congestion, sore throat, rhinorrhea, mouth sores, neck pain, neck stiffness, postnasal drip and sinus pressure.   Respiratory: Positive for cough ("occasional"). Negative for choking, chest  tightness, shortness of breath and wheezing.   Cardiovascular: Negative for chest pain, palpitations and leg swelling.  Gastrointestinal: Positive for nausea, vomiting, abdominal pain (mild cramping) and diarrhea. Negative for constipation, blood in stool and rectal pain.  Endocrine: Negative for polyuria.  Genitourinary: Negative for flank pain and difficulty urinating.  Musculoskeletal: Negative for myalgias, back pain and arthralgias.  Skin: Positive for pallor. Negative for rash and wound.  Neurological: Positive for weakness (generalized). Negative for syncope, light-headedness and headaches.  Psychiatric/Behavioral: Negative for confusion. The patient is not nervous/anxious.     Allergies  Review of patient's allergies indicates no known allergies.  Home Medications   Current Outpatient Rx  Name  Route  Sig  Dispense  Refill  . aspirin EC 81 MG tablet   Oral   Take 81 mg by mouth daily.         . calcium-vitamin D (OSCAL WITH D) 500-200 MG-UNIT per tablet   Oral   Take 1 tablet by mouth daily.         . cholecalciferol (VITAMIN D) 1000 UNITS tablet   Oral   Take 1,000 Units by mouth daily.         Marland Kitchen conjugated estrogens (PREMARIN) vaginal cream   Vaginal   Place 1 g vaginally 3 (three) times a week.         . mometasone (NASONEX) 50 MCG/ACT nasal spray   Nasal   Place 2 sprays into the nose daily.         . niacin 100 MG  tablet   Oral   Take 100 mg by mouth daily with breakfast.         . tetrahydrozoline 0.05 % ophthalmic solution   Both Eyes   Place 1 drop into both eyes daily as needed. For dry eyes         . vitamin C (ASCORBIC ACID) 500 MG tablet   Oral   Take 500 mg by mouth daily.           BP 72/40  Pulse 132  Resp 21  Physical Exam  Constitutional: She is oriented to person, place, and time. No distress. She is not intubated.  HENT:  Head: Normocephalic and atraumatic. Head is without raccoon's eyes, without Battle's sign,  without contusion, without right periorbital erythema and without left periorbital erythema.  Right Ear: External ear normal.  Left Ear: External ear normal.  Nose: Nose normal. No rhinorrhea. No epistaxis.  Mouth/Throat: Mucous membranes are not pale, dry and not cyanotic. No oropharyngeal exudate.  Eyes: Conjunctivae are normal. Pupils are equal, round, and reactive to light. Right eye exhibits no discharge. Left eye exhibits no discharge. No scleral icterus.  Neck: Normal range of motion. Neck supple. No JVD present. No muscular tenderness present. Carotid bruit is not present. No tracheal deviation, no edema, no erythema and normal range of motion present.  Cardiovascular: Regular rhythm, normal heart sounds, intact distal pulses and normal pulses.  Tachycardia present.  PMI is not displaced.  Exam reveals no gallop and no decreased pulses.   No murmur heard. Pulmonary/Chest: No accessory muscle usage or stridor. Tachypnea (mild) noted. No apnea and not bradypneic. She is not intubated. No respiratory distress. She has decreased breath sounds (bilateral bases) in the right lower field and the left lower field. She has no wheezes. She has no rhonchi. She has no rales.  Abdominal: Soft. Normal appearance. She exhibits no shifting dullness, no distension, no pulsatile liver, no fluid wave, no abdominal bruit, no ascites, no pulsatile midline mass and no mass. Bowel sounds are increased. There is no hepatosplenomegaly. There is generalized tenderness (mild). There is no rigidity, no rebound, no guarding, no CVA tenderness, no tenderness at McBurney's point and negative Murphy's sign. No hernia.  Musculoskeletal: Normal range of motion. She exhibits no edema and no tenderness.  Lymphadenopathy:    She has no cervical adenopathy.  Neurological: She is alert and oriented to person, place, and time. No cranial nerve deficit.  Skin: Skin is warm and dry. No rash noted. She is not diaphoretic. No erythema.  No pallor.  Nl capillary refill   Psychiatric: She has a normal mood and affect.    ED Course  Procedures (including critical care time)  Labs Reviewed  CBC  COMPREHENSIVE METABOLIC PANEL  LIPASE, BLOOD  LACTIC ACID, PLASMA  OCCULT BLOOD GASTRIC / DUODENUM   No results found.   No diagnosis found.   Date: 08/19/2012 @1027   Rate: 124 bpm  Rhythm: sinus tachycardia  QRS Axis: normal  Intervals: normal  ST/T Wave abnormalities: nonspecific ST changes  Conduction Disutrbances:none  Narrative Interpretation:   Old EKG Reviewed: changes noted, may be rate related.      MDM  Pt presents for evaluation of NV x 5 days and diarrhea this morning.  She appears A&O, pleasant, but acutely ill.  She has active vomiting, tachycardia, hypotension, and a mildly low O2 sat.  She describes s/s consistent with a gastroenteritis.  Secondary to inclement weather and less severe symptoms, she had  not sought medical attention earlier.  Will administer antiemetics and bolus IVF.  Will obtain an xray of the chest and abdomen.  Will also obtain a rectal temperature and basic belly labs.  1230.  Pt's BP and HR are improving.  She is no longer actively vomiting.  There is no infiltrate on the CXR and no obstruction on the x-ray of the abdomen.  Secondary to the leukocytosis and significantly elevated serum lactic acid, a CT scan of the abdomen and pelvis has been ordered.  The emesis is positive for occult blood but it is essentially clear.  She denies melena or hematochezia.  At this time, no antibiotics have been ordered.  She has been afebrile and there is no obvious source for infection.  Continuing IVF after the initial bolus.  Also note acute renal failure.  I discussed her evaluation with Dr. Malachi Bonds (hospitalist).  She will be admitted to a step-down bed.  CRITICAL CARE Performed by: Dana Allan T   Total critical care time: 35  Critical care time was exclusive of separately billable  procedures and treating other patients.  Critical care was necessary to treat or prevent imminent or life-threatening deterioration.  Critical care was time spent personally by me on the following activities: development of treatment plan with patient and/or surrogate as well as nursing, discussions with consultants, evaluation of patient's response to treatment, examination of patient, obtaining history from patient or surrogate, ordering and performing treatments and interventions, ordering and review of laboratory studies, ordering and review of radiographic studies, pulse oximetry and re-evaluation of patient's condition.     Tobin Chad, MD 08/19/12 [phone]

## 2012-08-19 NOTE — ED Notes (Addendum)
Patient c/o N/V/D since Tuesday with increased weakness.  Patient c/o stomach ache (epigastric) of 4/10.  Patient has not been on ABX and denies fevers.

## 2012-08-19 NOTE — H&P (Signed)
[facility] History and Physical  SHIVANI BARRANTES ZOX:[id number] DOB: 1921/07/12 DOA: 08/19/2012  Referring physician:  Dana Allan PCP:  No primary provider on file.   Chief Complaint:  Nausea and vomiting  HPI:  The patient is a 79 y.o. year-old F with history of hyperlipidemia, osteoarthritis, osteoporosis who presents with 5 days of severe nausea and vomiting.  She states that she was in her usual health 6 days ago.  Five days ago, she started feeling sick to her stomach with some mild pressure or cramping in the lower abdomen.  She then started vomiting constantly "every 10 minutes or less" almost all night.  Her N/V eased a little the subsequent day and she did not have much vomiting.  The last 2 days prior to admission, however, she has not attempted any food and she has had only sips of water and soda which she promptly vomited.  Nothing has made her N/V better and drinking liquids seems to make it worse.  For the last three days she has noticed some streaks of bright red blood and some dark vomit with possibly some small amount of coffee grounds.  She has had no BMs until the last 24 hours and she has had a BM that was balls of stool with some blood at the end, which she attributed to her hemorrhoids.  There was dark liquid mixed in with her stool.  Her family brought her to the ER today for further evaluation and for dehydration.    In the ER, she was hypotensive to 72/40 and tachycardic to the 130s/140s in new onset atrial fibrillation.  Labs were notable for WBC 27.6, hgb 16.7, plt 332, potassium 2.9, BUN 70, creatinine 3.85, troponin 0.47.  CT abd/pelvis demonstrated possible diverticulitis at the distal descending colon.    Severe nausea and vomiting   ' She has vomited bright red blood and some dark material.    Sore throat   Severe back pain that is alleviated by walking, it becomes pressure when she walks.  Positioning helps with the pain and heat pack.     Review  of Systems:   She denies fevers, chills, rhinorrhea, sinus congestion.  She wears glasses and may be slightly hard of hearing.  She has had a sore throat from all of the vomiting.  Denies SOB, chest pain or pressure, neck and arm pain or pressure.  Denies abdominal pain or discomfort.  She has baseline pain in teh shoulders and knees from arthritis and has a left flank pressure which feels like it is right under the skin and is relieved by massage.  She denies dysuria, frequency, urgency, and has not urinated in over 24 hours.  Denies abnormal bruising.  No hemoptysis.  No focal numbness or weakness.     Past Medical History  Diagnosis Date  . Iron deficiency anemia   . High cholesterol   . Osteoarthritis   . Female bladder prolapse   . Osteoporosis   . Cataracts, bilateral   . Gallstones   . Hemorrhoids   . Diverticulosis   . Hiatal hernia    Past Surgical History  Procedure Laterality Date  . Cholecystectomy    . Cataract extraction     Social History:  reports that she has never smoked. She does not have any smokeless tobacco history on file. She reports that she does not drink alcohol or use illicit drugs. Able to complete ADLs without assistance  No Known Allergies  Family History  Problem  Relation Age of Onset  . Breast cancer Paternal Aunt   . Breast cancer Paternal Aunt   . Cancer      several family members on dad's side of family   Prior to Admission medications   Medication Sig Start Date End Date Taking? Authorizing Provider  aspirin EC 81 MG tablet Take 81 mg by mouth daily.   Yes Historical Provider, MD  calcium-vitamin D (OSCAL WITH D) 500-200 MG-UNIT per tablet Take 1 tablet by mouth daily.   Yes Historical Provider, MD  cholecalciferol (VITAMIN D) 1000 UNITS tablet Take 1,000 Units by mouth daily.   Yes Historical Provider, MD  conjugated estrogens (PREMARIN) vaginal cream Place 1 g vaginally 3 (three) times a week.   Yes Historical Provider, MD  ferrous  sulfate 325 (65 FE) MG tablet Take 325 mg by mouth daily with breakfast.   Yes Historical Provider, MD  NIACIN CR PO Take 1 tablet by mouth daily.   Yes Historical Provider, MD  vitamin C (ASCORBIC ACID) 500 MG tablet Take 500 mg by mouth daily.   Yes Historical Provider, MD   Physical Exam: Filed Vitals:   08/19/12 1200 08/19/12 1300 08/19/12 1410 08/19/12 1426  BP: 117/65 110/79 105/59 113/51  Pulse:   85 65  Temp:      TempSrc:      Resp: 19 21  18   SpO2:    2%     General:  Caucasian female, no acute distress, vomiting constantly during history and physical.    Eyes:  PERRL, anicteric, mildly injected.  ENT:  Nares with nasal canula.  Patient vomiting constantly so tongue appears moist from emesis.  Neck:  Supple without TM or JVD.  Lymph:  No cervical, supraclavicular, or submandibular LAD.  Cardiovascular:  Tachycardic, IRRR, no m/r/g.  2+ pulses, cool extremities  Respiratory:  CTA bilaterally without increased WOB.  Abdomen:  Hyperactive BS.  Soft, ND/NT.  Skin:  No rashes or focal lesions.  Musculoskeletal:  Normal bulk and tone.  No LE edema.  Psychiatric:  A & O x 4.  Appropriate affect.  Neurologic:  CN 3-12 intact.  5/5 strength.  Sensation grossly intact.  Labs on Admission:  Basic Metabolic Panel:  Recent Labs Lab 08/19/12 1034  NA 138  K 2.9*  CL 86*  CO2 24  GLUCOSE 226*  BUN 70*  CREATININE 3.85*  CALCIUM 9.3   Liver Function Tests:  Recent Labs Lab 08/19/12 1034  AST 39*  ALT 20  ALKPHOS 108  BILITOT 0.8  PROT 8.1  ALBUMIN 3.4*    Recent Labs Lab 08/19/12 1034  LIPASE 25   No results found for this basename: AMMONIA,  in the last 168 hours CBC:  Recent Labs Lab 08/19/12 1034  WBC 27.6*  HGB 16.7*  HCT 49.5*  MCV 89.4  PLT 332   Cardiac Enzymes:  Recent Labs Lab 08/19/12 1316  TROPONINI 0.47*    BNP (last 3 results) No results found for this basename: PROBNP,  in the last 8760 hours CBG: No results found  for this basename: GLUCAP,  in the last 168 hours  Radiological Exams on Admission: Ct Abdomen Pelvis Wo Contrast  08/19/2012  *RADIOLOGY REPORT*  Clinical Data: 79 year old female with abdominal pelvic pain and vomiting.  Elevated white count.  CT ABDOMEN AND PELVIS WITHOUT CONTRAST  Technique:  Multidetector CT imaging of the abdomen and pelvis was performed following the standard protocol without intravenous contrast.  Comparison: 02/2013 radiographs  Findings: A  very large hiatal hernia is noted.  The liver, spleen and adrenal glands are unremarkable. The pancreas and kidneys are mildly atrophic with a left renal cyst identified. The patient is status post cholecystectomy.  Please note that parenchymal abnormalities may be missed as intravenous contrast was not administered.  Diffuse colonic diverticulosis noted with equivocal minimal stranding along the mid - distal descending colon - very mild diverticulitis is not excluded.  There is no evidence of pneumoperitoneum, focal abscess or bowel obstruction. The appendix is normal.  No free fluid, enlarged lymph nodes, biliary dilation or abdominal aortic aneurysm identified.  A pessary is noted.  No acute or suspicious bony abnormalities are noted.  Degenerative changes throughout the lumbar spine are present.  IMPRESSION: Very large hiatal hernia.  Possible very mild diverticulitis along the mid - distal descending colon - no evidence of pneumoperitoneum, focal abscess or bowel obstruction.  Mildly atrophic kidneys.   Original Report Authenticated By: Harmon Pier, M.D.    Dg Chest Port 1 View  08/19/2012  *RADIOLOGY REPORT*  Clinical Data:  Pain and vomiting.  PORTABLE CHEST - 1 VIEW  Comparison: None  Findings: Cardiomegaly is present. There is no evidence of focal airspace disease, pulmonary edema, suspicious pulmonary nodule/mass, pleural effusion, or pneumothorax. No acute bony abnormalities are identified. Degenerative changes within the shoulders  identified.  IMPRESSION: Cardiomegaly without evidence of acute cardiopulmonary disease.   Original Report Authenticated By: Harmon Pier, M.D.    Dg Abd Portable 2v  08/19/2012  *RADIOLOGY REPORT*  Clinical Data: Abdominal pain and vomiting.  PORTABLE ABDOMEN - 2 VIEW  Comparison: None  Findings: The bowel gas pattern is unremarkable. A small to moderate amount of stool in the colon is noted.  No dilated bowel loops are identified. There is no evidence of pneumoperitoneum. Degenerative changes in the hips and lumbar spine are present.  IMPRESSION: No evidence of acute abnormality - unremarkable bowel gas pattern.   Original Report Authenticated By: Harmon Pier, M.D.     EKG:   Atrial fibrillation with ventricular rate 124.    Assessment/Plan Principal Problem:   Hypovolemic shock Active Problems:   Iron deficiency anemia   High cholesterol   Hemorrhoids   Intractable nausea and vomiting   Hypotension, unspecified   Dehydration   Hematemesis   Atrial fibrillation   Leukocytosis, unspecified   Hypokalemia   Acute kidney injury   Elevated troponin I level   Elevated lactic acid level  HypoTN, likely hypovolemic shock/dehydration due to intractable nausea and vomiting.  Atlhough WBC elevated, sepsis may be less likely due to normal procalcitonin -  NS at 174ml/h  -  F/u blood cultures -  Continue zosyn pending results of blood cultures and clinically improving  Nausea and vomiting, likely due viral illness.  Rule out UTI: -  UA -  Scheduled zofran -  Phenergan prn  Hematemesis:  Mild and likely due to mallory weiss tear vs. gastritis.  Anticipate hgb to drop because severely dehydrated and receiving IVF.  Will monitor clinically -  Dr. Madilyn Fireman previously performed EGD early 2000s and found hiatal hernia and gastritis.  Consult if hgb drops precipitously or evidence of more signficant GIB. -  NPO -  Protonix gtt -  q6h CBC  Blood in stool:  Hx suggests hemorrhoidal bleeding, not  melena which would be expected with UGIB.  Possible mild diverticulitis:  CT limited both by severe dehydration and by lack of IV contrast -  Will start zosyn for now, particularly  in setting of leukocytosis and hypoTN  Atrial fibrillation, new onset with tachycardia.  Tachycardia may be compensatory for hypovolemia/shock.   -  Will check TSH to see if very abnormal (expect sick euthyroid) -  Check ECHO when heart rate more normal -  No A/C at this time due to active GIB -  Will hydrate to see if HR will trend down on its own.    Elevated troponin I level, likely due to demand ischemia from atrial fibrillation and to AKI/anuria/oliguria -  Will trend, but no A/C or further management at this time. -  Pt had stress test 08/2010 which was negative  HLD, on niacin -  Hold oral meds for now  Leukocytosis, may be due to viral illness, dehydration -  Abx, hydrate -  Trend  Iron deficiency anemia, hgb elevated due to dehydration -  Trend hgb -  Hold oral iron due to nausea   Hypokalemia, likely due to GI losses -  Given IV repletion in ER -  Repeat BMP -  Avoid potassium in IVF for now given no uop in > 24 hours and AKI  Acute kidney injury with anuria vs. Oliguria, likely severely prerenal -  Foley -  Strict i/o -  Hydrate -  FENa -  UA -  CT abd demonstrates mildly atrophic kidneys  Hyperglycemia, likely due to stress reaction -  Non acidotic, no gap.   -  Hydrate and monitor with labs  Elevated lactic acid level, likely due to severe dehydration -  Trend q6h for now  Diet:  NPO Access:  PIV IVF:  NS at 159ml/h Proph:  SCDs  Code Status: full code Family Communication: spoke with patient and daughter, Stevphen Meuse, nurse at [employer] Disposition Plan:  Admit to stepdown  Time spent: 75 min  Renae Fickle [facility] Pager [phone]  If 7PM-7AM, please contact night-coverage [email or web address] Password [facility] 08/19/2012, 3:45 PM

## 2012-08-19 NOTE — ED Notes (Signed)
Dr. Malachi Bonds made aware of troponin.

## 2012-08-20 ENCOUNTER — Inpatient Hospital Stay (HOSPITAL_COMMUNITY): Payer: Medicare Other

## 2012-08-20 DIAGNOSIS — N179 Acute kidney failure, unspecified: Secondary | ICD-10-CM

## 2012-08-20 DIAGNOSIS — E872 Acidosis, unspecified: Secondary | ICD-10-CM

## 2012-08-20 DIAGNOSIS — J9600 Acute respiratory failure, unspecified whether with hypoxia or hypercapnia: Secondary | ICD-10-CM

## 2012-08-20 DIAGNOSIS — R578 Other shock: Secondary | ICD-10-CM

## 2012-08-20 DIAGNOSIS — K920 Hematemesis: Secondary | ICD-10-CM

## 2012-08-20 DIAGNOSIS — J189 Pneumonia, unspecified organism: Secondary | ICD-10-CM

## 2012-08-20 DIAGNOSIS — R7989 Other specified abnormal findings of blood chemistry: Secondary | ICD-10-CM

## 2012-08-20 LAB — MAGNESIUM: Magnesium: 2 mg/dL (ref 1.5–2.5)

## 2012-08-20 LAB — CBC
MCHC: 33 g/dL (ref 30.0–36.0)
MCV: 90.6 fL (ref 78.0–100.0)
Platelets: 240 10*3/uL (ref 150–400)
RDW: 15.1 % (ref 11.5–15.5)
WBC: 20.4 10*3/uL — ABNORMAL HIGH (ref 4.0–10.5)

## 2012-08-20 LAB — COMPREHENSIVE METABOLIC PANEL
ALT: 16 U/L (ref 0–35)
AST: 26 U/L (ref 0–37)
Albumin: 2.4 g/dL — ABNORMAL LOW (ref 3.5–5.2)
CO2: 27 mEq/L (ref 19–32)
Chloride: 100 mEq/L (ref 96–112)
Creatinine, Ser: 3.38 mg/dL — ABNORMAL HIGH (ref 0.50–1.10)
Sodium: 139 mEq/L (ref 135–145)
Total Bilirubin: 0.4 mg/dL (ref 0.3–1.2)

## 2012-08-20 LAB — SODIUM, URINE, RANDOM: Sodium, Ur: 32 mEq/L

## 2012-08-20 MED ORDER — LEVOFLOXACIN IN D5W 500 MG/100ML IV SOLN
500.0000 mg | INTRAVENOUS | Status: DC
Start: 2012-08-22 — End: 2012-08-25
  Administered 2012-08-22 – 2012-08-25 (×2): 500 mg via INTRAVENOUS
  Filled 2012-08-20 (×3): qty 100

## 2012-08-20 MED ORDER — AZITHROMYCIN 500 MG IV SOLR: 500.0000 mg | INTRAVENOUS | Status: DC

## 2012-08-20 MED ORDER — SODIUM CHLORIDE 0.9 % IV BOLUS (SEPSIS)
500.0000 mL | Freq: Once | INTRAVENOUS | Status: AC
  Administered 2012-08-20: 500 mL via INTRAVENOUS

## 2012-08-20 MED ORDER — AMIODARONE HCL IN DEXTROSE 360-4.14 MG/200ML-% IV SOLN
60.0000 mg/h | INTRAVENOUS | Status: DC
  Administered 2012-08-20: 60 mg/h via INTRAVENOUS
  Filled 2012-08-20 (×2): qty 200

## 2012-08-20 MED ORDER — LEVOFLOXACIN IN D5W 750 MG/150ML IV SOLN
750.0000 mg | Freq: Once | INTRAVENOUS | Status: AC
  Administered 2012-08-20: 750 mg via INTRAVENOUS
  Filled 2012-08-20 (×2): qty 150

## 2012-08-20 MED ORDER — AMIODARONE LOAD VIA INFUSION
150.0000 mg | Freq: Once | INTRAVENOUS | Status: AC
  Administered 2012-08-20: 150 mg via INTRAVENOUS
  Filled 2012-08-20: qty 83.34

## 2012-08-20 MED ORDER — DILTIAZEM HCL 100 MG IV SOLR: 5.0000 mg/h | INTRAVENOUS | Status: DC

## 2012-08-20 MED ORDER — FUROSEMIDE 10 MG/ML IJ SOLN: 20.0000 mg | Freq: Once | INTRAMUSCULAR | Status: DC

## 2012-08-20 MED ORDER — AMIODARONE HCL IN DEXTROSE 360-4.14 MG/200ML-% IV SOLN
30.0000 mg/h | INTRAVENOUS | Status: DC
  Administered 2012-08-21 – 2012-08-22 (×4): 30 mg/h via INTRAVENOUS
  Filled 2012-08-20 (×3): qty 200

## 2012-08-20 MED ORDER — ONDANSETRON 8 MG/NS 50 ML IVPB
8.0000 mg | INTRAVENOUS | Status: DC | PRN
  Filled 2012-08-20: qty 8

## 2012-08-20 MED ORDER — METOPROLOL TARTRATE 1 MG/ML IV SOLN
5.0000 mg | Freq: Once | INTRAVENOUS | Status: AC
  Administered 2012-08-20: 5 mg via INTRAVENOUS
  Filled 2012-08-20: qty 5

## 2012-08-20 MED ORDER — AZITHROMYCIN 500 MG IV SOLR
500.0000 mg | INTRAVENOUS | Status: DC
  Administered 2012-08-20: 500 mg via INTRAVENOUS
  Filled 2012-08-20 (×2): qty 500

## 2012-08-20 MED ORDER — DILTIAZEM LOAD VIA INFUSION: 10.0000 mg | Freq: Once | INTRAVENOUS | Status: DC

## 2012-08-20 MED ORDER — FENTANYL CITRATE 0.05 MG/ML IJ SOLN
12.5000 ug | INTRAMUSCULAR | Status: DC | PRN
  Administered 2012-08-23: 12.5 ug via INTRAVENOUS
  Filled 2012-08-20: qty 2

## 2012-08-20 MED ORDER — PHENYLEPHRINE HCL 10 MG/ML IJ SOLN
30.0000 ug/min | INTRAVENOUS | Status: DC
  Administered 2012-08-21: 130 ug/min via INTRAVENOUS
  Administered 2012-08-21: 110 ug/min via INTRAVENOUS
  Filled 2012-08-20 (×2): qty 4

## 2012-08-20 MED ORDER — SODIUM CHLORIDE 0.9 % IV BOLUS (SEPSIS)
250.0000 mL | Freq: Once | INTRAVENOUS | Status: AC
  Administered 2012-08-20: 250 mL via INTRAVENOUS

## 2012-08-20 MED ORDER — VANCOMYCIN HCL IN DEXTROSE 1-5 GM/200ML-% IV SOLN
1000.0000 mg | INTRAVENOUS | Status: DC
  Administered 2012-08-20 – 2012-08-22 (×2): 1000 mg via INTRAVENOUS
  Filled 2012-08-20 (×2): qty 200

## 2012-08-20 MED ORDER — PHENYLEPHRINE HCL 10 MG/ML IJ SOLN
30.0000 ug/min | INTRAVENOUS | Status: DC
  Administered 2012-08-20: 30 ug/min via INTRAVENOUS
  Filled 2012-08-20 (×3): qty 1

## 2012-08-20 NOTE — Progress Notes (Addendum)
Notified by RN that patient was still tachycardic to the 130s.  Attempted to give a small bolus.  Pt received of bolus and developed acute respiratory distress and tachypnea requiring NRB.  IVF were stopped.  Her HR increased to the high 130s.  She was given 1 dose of metoprolol 5mg  once and HR trended down to 110s, BP remained stable.  CXR demonstrates a dense RLL pneumonia and possibly some infiltrate in the RUL, LLL and question bilateral effusions (blunting of the left angle).  May have some pulmonary edema.   GEN:  CF, sleepy but arouseable after phenergan HEENT:  Nonrebreather in place CV:  IRRR, tachycardic PULM:  Rhonchi throughout, rales heard anteriorly.  Bronchial breath sounds at the right base.  No wheezes MSK:  No LEE  A/P:   Pneumonia Patient has pneumonia versus collapse of the RLL and possibly RML.  May have aspiration pneumonia from intractable vomiting versus community acquired PNA.  Possible pulmonary edema.   Increasing tachypnea.   -  Continue zosyn -  F/u blood cultures -  Step pneumo urine ag -  Legionella urine ag -  Flutter valve when able -  Wean oxygen as tolerated.  Breathing in low 20s, O2 sat 100% on NRB -  Will notify Elink MD of patient's worsening respiratory status  Atrial fibrillation -  Metoprolol 5mg  IV prn persistent tachycardia to the 130s -  ECHO ordered  Intractable N/V: -  D/C phenergan 2/2 sedation -  Additional zofran ordered  Pain control: -  Transition back to fentanyl as suspect patient will have lower blood pressures if being given metoprolol for heart rate.  Will readdress fluids after radiologist reviews CXR.

## 2012-08-20 NOTE — Procedures (Signed)
Central Venous Catheter Insertion Procedure Note Carla [name] [id number] 1921-12-16  Procedure: Insertion of Central Venous Catheter Indications: Assessment of intravascular volume, Drug and/or fluid administration and Frequent blood sampling  Procedure Details Consent: Risks of procedure as well as the alternatives and risks of each were explained to the (patient/caregiver).  Consent for procedure obtained. Time Out: Verified patient identification, verified procedure, site/side was marked, verified correct patient position, special equipment/implants available, medications/allergies/relevent history reviewed, required imaging and test results available.  Performed  Maximum sterile technique was used including antiseptics, cap, gloves, gown, hand hygiene, mask and sheet. Skin prep: Chlorhexidine; local anesthetic administered A antimicrobial bonded/coated triple lumen catheter was placed in the left internal jugular vein under direct visualization with ultrasound using the Seldinger technique.  Evaluation Blood flow good Complications: No apparent complications Patient did tolerate procedure well. Chest X-ray ordered to verify placement.  CXR: normal.  Overton Mam, M.D. Pulmonary and Critical Care Medicine Call E-link with questions [phone] 08/20/2012, 10:40 PM

## 2012-08-20 NOTE — Progress Notes (Signed)
Pt's O2 sats dropped to 82-85% sustained. Increased [place] to 6L, then put on Venti mask, then advanced to non-rebreather mask. O2 sats 98% on NRB. Pt states no SOB, in no distress. NP Lynch notified. Chest Xray ordered. VSS, will continue to monitor.  Carla [name]

## 2012-08-20 NOTE — Progress Notes (Signed)
ANTIBIOTIC CONSULT NOTE - INITIAL  Pharmacy Consult for Vancomycin & Levaquin Indication: Pneumonia  No Known Allergies  Patient Measurements: Height: 5\' 2"  (157.5 cm) Weight: 159 lb 6.3 oz (72.3 kg) IBW/kg (Calculated) : 50.1   Vital Signs: Temp: 98.9 F (37.2 C) (02/16 2009) Temp src: Axillary (02/16 2009) BP: 68/35 mmHg (02/16 2000) Pulse Rate: 106 (02/16 2000) Intake/Output from previous day: 02/15 0701 - 02/16 0700 In: 3812.5 [I.V.:1800; IV Piggyback:2012.5] Out: 335 [Urine:335] Intake/Output from this shift: Total I/O In: 250 [IV Piggyback:250] Out: -   Labs:  Recent Labs  08/19/12 1034 08/19/12 1727 08/19/12 1851 08/20/12 0310 08/20/12 0315  WBC 27.6*  --  25.1*  --  20.4*  HGB 16.7*  --  13.5  --  13.3  PLT 332  --  258  --  240  LABCREA  --  199.9  --   --   --   CREATININE 3.85*  --  3.59* 3.38*  --    Estimated Creatinine Clearance: 10.7 ml/min (by C-G formula based on Cr of 3.38). No results found for this basename: VANCOTROUGH, Leodis Binet, VANCORANDOM, GENTTROUGH, GENTPEAK, GENTRANDOM, TOBRATROUGH, TOBRAPEAK, TOBRARND, AMIKACINPEAK, AMIKACINTROU, AMIKACIN,  in the last 72 hours   Microbiology: Recent Results (from the past 720 hour(s))  CULTURE, BLOOD (ROUTINE X 2)     Status: None   Collection Time    08/19/12  2:03 PM      Result Value Range Status   Specimen Description BLOOD LEFT HAND   Final   Special Requests BOTTLES DRAWN AEROBIC ONLY 1CC   Final   Culture  Setup Time 08/19/2012 18:03   Final   Culture     Final   Value:        BLOOD CULTURE RECEIVED NO GROWTH TO DATE CULTURE WILL BE HELD FOR 5 DAYS BEFORE ISSUING A FINAL NEGATIVE REPORT   Report Status PENDING   Incomplete  CULTURE, BLOOD (ROUTINE X 2)     Status: None   Collection Time    08/19/12  2:10 PM      Result Value Range Status   Specimen Description BLOOD RIGHT HAND   Final   Special Requests BOTTLES DRAWN AEROBIC ONLY 1CC   Final   Culture  Setup Time 08/19/2012 18:03    Final   Culture     Final   Value:        BLOOD CULTURE RECEIVED NO GROWTH TO DATE CULTURE WILL BE HELD FOR 5 DAYS BEFORE ISSUING A FINAL NEGATIVE REPORT   Report Status PENDING   Incomplete  MRSA PCR SCREENING     Status: None   Collection Time    08/19/12  3:10 PM      Result Value Range Status   MRSA by PCR NEGATIVE  NEGATIVE Final   Comment:            The GeneXpert MRSA Assay (FDA     approved for NASAL specimens     only), is one component of a     comprehensive MRSA colonization     surveillance program. It is not     intended to diagnose MRSA     infection nor to guide or     monitor treatment for     MRSA infections.    Medical History: Past Medical History  Diagnosis Date  . Iron deficiency anemia   . High cholesterol   . Osteoarthritis   . Female bladder prolapse   . Osteoporosis   .  Cataracts, bilateral   . Gallstones   . Hemorrhoids   . Diverticulosis   . Hiatal hernia     Medications:  Scheduled:  . [COMPLETED] amiodarone  150 mg Intravenous Once  . antiseptic oral rinse  15 mL Mouth Rinse BID  . estradiol  1 Applicatorful Vaginal 3 times weekly  . [COMPLETED] metoprolol  5 mg Intravenous Once  . [COMPLETED] metoprolol  5 mg Intravenous Once  . ondansetron (ZOFRAN) IV  8 mg Intravenous Q6H  . piperacillin-tazobactam (ZOSYN)  IV  2.25 g Intravenous Q8H  . [COMPLETED] sodium chloride  1,000 mL Intravenous Once  . [COMPLETED] sodium chloride  250 mL Intravenous Once  . [COMPLETED] sodium chloride  500 mL Intravenous Once  . sodium chloride  3 mL Intravenous Q12H  . [DISCONTINUED] azithromycin  500 mg Intravenous Q24H  . [DISCONTINUED] azithromycin  500 mg Intravenous Q24H  . [DISCONTINUED] diltiazem  10 mg Intravenous Once  . [DISCONTINUED] furosemide  20 mg Intravenous Once   Infusions:  . amiodarone (NEXTERONE PREMIX) 360 mg/200 mL dextrose 60 mg/hr (08/20/12 2008)   And  . [START ON 08/21/2012] amiodarone (NEXTERONE PREMIX) 360 mg/200 mL dextrose     . pantoprozole (PROTONIX) infusion 8 mg/hr (08/20/12 1425)  . phenylephrine (NEO-SYNEPHRINE) Adult infusion 30 mcg/min (08/20/12 2102)  . [DISCONTINUED] sodium chloride 100 mL/hr at 08/20/12 1003  . [DISCONTINUED] diltiazem (CARDIZEM) infusion     Assessment:  79 yr old female known to pharmacy from Zosyn dosing for diverticulitis  Chest Xray shows dense RLL pneumonia with possible infiltrate  Blood cultures ordered  IV Vancomycin and Levaquin to be initiated  CrCl ~ 10 ml/min  Goal of Therapy:  Vancomycin trough level 15-20 mcg/ml  Plan:  Measure antibiotic drug levels at steady state Follow up culture results Continue Zosyn Vancomycin 1000mg  IV q48h Levaquin 750mg  IV x 1 then 500mg  IV q48 hr  Porshia Blizzard, Joselyn Glassman, PharmD 08/20/2012,10:20 PM

## 2012-08-20 NOTE — Progress Notes (Signed)
UR completed 

## 2012-08-20 NOTE — Progress Notes (Signed)
Wedding rings sent home with daughter, [name]. Carla [name]

## 2012-08-20 NOTE — Progress Notes (Signed)
[facility] PROGRESS NOTE  LAINE FONNER ZOX:[id number] DOB: 1921-09-19 DOA: 08/19/2012 PCP: No primary provider on file.  Assessment/Plan  HypoTN, likely hypovolemic shock/dehydration due to intractable nausea and vomiting. Atlhough WBC elevated, sepsis may be less likely due to normal procalcitonin.  BP improving somewhat.  + 3.5L yesterday - Decrease NS to 100 ml/h due to hypoxia - F/u blood cultures  - Continue zosyn pending results of blood cultures and clinically improving   Nausea and vomiting, likely due viral illness, uremia, possible sepsis and UTI:  No vomiting overnight.   - UA suggestive of UTI - Scheduled zofran  - Phenergan prn   Hematemesis: Mild and likely due to mallory weiss tear vs. gastritis. Anticipated hgb drop because severely dehydrated and receiving IVF.  No further hematemesis since yesterday.    - Dr. Madilyn Fireman previously performed EGD early 2000s and found hiatal hernia and gastritis. Consult if hgb drops precipitously or evidence of more signficant GIB.  - Protonix gtt  - CBC qday unless bleeding recurs -  Advance diet  Blood in stool: Hx suggests hemorrhoidal bleeding, not melena which would be expected with UGIB.  Possible mild diverticulitis: CT limited both by severe dehydration and by lack of IV contrast  - Continue zosyn   CAP suggested on CXR and focal rales at the right base -  Continue zosyn -  Add azithromycin -  Incentive spirometry -  Wean oxygen as tolerated -  Will decrease IVF a little  Atrial fibrillation, new onset with tachycardia. Tachycardia may be compensatory for hypovolemia/shock and trending down with IVF.   - TSH wnl - Check ECHO when heart rate more normal  - No A/C at this time due to active GIB  - Continue hydration  Elevated troponin I level, likely due to demand ischemia from atrial fibrillation and to AKI/anuria/oliguria  - Trended down.   - Pt had stress test 08/2010 which was negative   HLD, on niacin  -  Hold oral meds for now   Leukocytosis, may be due to viral illness, dehydration, trending down - Cont Abx, hydration  Iron deficiency anemia, hgb elevated due to dehydration and trending down - Trend hgb  - Hold oral iron due to nausea   Hypokalemia, likely due to GI losses, resolved with IV repletion - Given IV repletion in ER   Acute kidney injury with anuria now oliguria, likely severely prerenal and risk for ATN, BUN/Cr trending down - continue Foley  - Strict i/o  - Hydrate  - FENa pending - UA suggestive of UTI - CT abd demonstrates mildly atrophic kidneys   Hyperglycemia, likely due to stress reaction and trending down with hydration - Non acidotic, no gap.  - Hydrate and monitor with labs   Elevated lactic acid level, likely due to severe dehydration, resolved with hydration  Diet: CLD Access: PIV  IVF: NS at 100 ml/h  Proph: SCDs   Code Status: full code  Family Communication: spoke with patient alone today.  Stevphen Meuse, nurse at [facility], is daughter Disposition Plan: Admit to stepdown   Consultants:  none  Procedures:  CT abd/pelvis  Antibiotics:  Zosyn 2/15 >>  Azithro 2/16 >>   HPI/Subjective: States that she feels somewhat better and would like to be discharged.  SHe is sick of being in the hospital and "have been here forever" despite being admitted just yesterday afternoon.  She endorses no nausea, but persistent vomiting, although nursing staff states she has not vomited at all  since yesterday evening.  Has some cough productive of white phlegm.  Denies wheeze.  Denies CP, diarrhea, abdominal pain.    Objective: Filed Vitals:   08/20/12 0300 08/20/12 0400 08/20/12 0410 08/20/12 0500  BP: 109/64 109/55  92/65  Pulse: 103 117  111  Temp:   98.6 F (37 C)   TempSrc:   Oral   Resp: 19 20  19   Height:      Weight:    72.3 kg (159 lb 6.3 oz)  SpO2: 100% 93%  93%    Intake/Output Summary (Last 24 hours) at 08/20/12 0741 Last data  filed at 08/20/12 0503  Gross per 24 hour  Intake 3812.5 ml  Output    335 ml  Net 3477.5 ml   Filed Weights   08/19/12 1500 08/20/12 0500  Weight: 68.3 kg (150 lb 9.2 oz) 72.3 kg (159 lb 6.3 oz)    Exam:   General:  CF, no acute distress  HEENT:  MMM  Cardiovascular:  IRRR, tachycardiac, no mrg, 2+ pulses, warm extremities  Respiratory:  Focal rales at the right base, no wheezes or rhonchi, no increased WOB.  NRB in place  Abdomen:  Hyperactive BS, soft, NT/ND  MSK:  Normal tone and bulk, no LEE  Neuro:  Grossly intact.  Possibly some mild confusion this morning.    Data Reviewed: Basic Metabolic Panel:  Recent Labs Lab 08/19/12 1034 08/19/12 1851 08/20/12 0310  NA 138 138 139  K 2.9* 3.7 4.3  CL 86* 95* 100  CO2 24 26 27   GLUCOSE 226* 153* 126*  BUN 70* 73* 68*  CREATININE 3.85* 3.59* 3.38*  CALCIUM 9.3 7.6* 7.5*   Liver Function Tests:  Recent Labs Lab 08/19/12 1034 08/20/12 0310  AST 39* 26  ALT 20 16  ALKPHOS 108 74  BILITOT 0.8 0.4  PROT 8.1 6.0  ALBUMIN 3.4* 2.4*    Recent Labs Lab 08/19/12 1034  LIPASE 25   No results found for this basename: AMMONIA,  in the last 168 hours CBC:  Recent Labs Lab 08/19/12 1034 08/19/12 1851 08/20/12 0315  WBC 27.6* 25.1* 20.4*  NEUTROABS 22.6*  --   --   HGB 16.7* 13.5 13.3  HCT 49.5* 41.1 40.3  MCV 89.4 88.6 90.6  PLT 332 258 240   Cardiac Enzymes:  Recent Labs Lab 08/19/12 1316 08/19/12 1951  TROPONINI 0.47* 0.34*   BNP (last 3 results) No results found for this basename: PROBNP,  in the last 8760 hours CBG: No results found for this basename: GLUCAP,  in the last 168 hours  Recent Results (from the past 240 hour(s))  MRSA PCR SCREENING     Status: None   Collection Time    08/19/12  3:10 PM      Result Value Range Status   MRSA by PCR NEGATIVE  NEGATIVE Final   Comment:            The GeneXpert MRSA Assay (FDA     approved for NASAL specimens     only), is one component of  a     comprehensive MRSA colonization     surveillance program. It is not     intended to diagnose MRSA     infection nor to guide or     monitor treatment for     MRSA infections.     Studies: Ct Abdomen Pelvis Wo Contrast  08/19/2012  *RADIOLOGY REPORT*  Clinical Data: 79 year old female with abdominal pelvic pain and  vomiting.  Elevated white count.  CT ABDOMEN AND PELVIS WITHOUT CONTRAST  Technique:  Multidetector CT imaging of the abdomen and pelvis was performed following the standard protocol without intravenous contrast.  Comparison: 02/2013 radiographs  Findings: A very large hiatal hernia is noted.  The liver, spleen and adrenal glands are unremarkable. The pancreas and kidneys are mildly atrophic with a left renal cyst identified. The patient is status post cholecystectomy.  Please note that parenchymal abnormalities may be missed as intravenous contrast was not administered.  Diffuse colonic diverticulosis noted with equivocal minimal stranding along the mid - distal descending colon - very mild diverticulitis is not excluded.  There is no evidence of pneumoperitoneum, focal abscess or bowel obstruction. The appendix is normal.  No free fluid, enlarged lymph nodes, biliary dilation or abdominal aortic aneurysm identified.  A pessary is noted.  No acute or suspicious bony abnormalities are noted.  Degenerative changes throughout the lumbar spine are present.  IMPRESSION: Very large hiatal hernia.  Possible very mild diverticulitis along the mid - distal descending colon - no evidence of pneumoperitoneum, focal abscess or bowel obstruction.  Mildly atrophic kidneys.   Original Report Authenticated By: Harmon Pier, M.D.    Dg Chest Port 1 View  08/20/2012  *RADIOLOGY REPORT*  Clinical Data: Decreased O2 saturation.  PORTABLE CHEST - 1 VIEW  Comparison: Chest radiograph performed 08/19/2012  Findings: There is mild elevation of the right hemidiaphragm, with associated atelectasis.  Lung  expansion is mildly decreased.  Vague mild left midlung zone opacity could reflect a mild infectious process.  Apparent right upper lung zone opacity is thought to reflect the overlying costochondral junction.  No pleural effusion or pneumothorax is seen.  The cardiomediastinal silhouette is borderline normal in size; a large hiatal hernia is again noted.  No acute osseous abnormalities are seen.  Degenerative change is noted at the right glenohumeral joint, with cortical irregularity and sclerotic change, and underlying osteophyte formation.  IMPRESSION:  1.  Mild elevation of the right hemidiaphragm, with associated atelectasis; vague mild left midlung zone opacity could reflect a mild infectious process. 2.  Large hiatal hernia again seen.   Original Report Authenticated By: Tonia Ghent, M.D.    Dg Chest Port 1 View  08/19/2012  *RADIOLOGY REPORT*  Clinical Data:  Pain and vomiting.  PORTABLE CHEST - 1 VIEW  Comparison: None  Findings: Cardiomegaly is present. There is no evidence of focal airspace disease, pulmonary edema, suspicious pulmonary nodule/mass, pleural effusion, or pneumothorax. No acute bony abnormalities are identified. Degenerative changes within the shoulders identified.  IMPRESSION: Cardiomegaly without evidence of acute cardiopulmonary disease.   Original Report Authenticated By: Harmon Pier, M.D.    Dg Abd Portable 2v  08/19/2012  *RADIOLOGY REPORT*  Clinical Data: Abdominal pain and vomiting.  PORTABLE ABDOMEN - 2 VIEW  Comparison: None  Findings: The bowel gas pattern is unremarkable. A small to moderate amount of stool in the colon is noted.  No dilated bowel loops are identified. There is no evidence of pneumoperitoneum. Degenerative changes in the hips and lumbar spine are present.  IMPRESSION: No evidence of acute abnormality - unremarkable bowel gas pattern.   Original Report Authenticated By: Harmon Pier, M.D.     Scheduled Meds: . antiseptic oral rinse  15 mL Mouth Rinse BID   . azithromycin  500 mg Intravenous Q24H  . estradiol  1 Applicatorful Vaginal 3 times weekly  . ondansetron (ZOFRAN) IV  8 mg Intravenous Q6H  . piperacillin-tazobactam (ZOSYN)  IV  2.25 g Intravenous Q8H  . sodium chloride  3 mL Intravenous Q12H   Continuous Infusions: . sodium chloride 125 mL/hr at 08/20/12 0500  . pantoprozole (PROTONIX) infusion 8 mg/hr (08/20/12 0515)    Principal Problem:   Hypovolemic shock Active Problems:   Iron deficiency anemia   High cholesterol   Hemorrhoids   Intractable nausea and vomiting   Hypotension, unspecified   Dehydration   Hematemesis   Atrial fibrillation   Leukocytosis, unspecified   Hypokalemia   Acute kidney injury   Elevated troponin I level   Elevated lactic acid level    Time spent: 30 min    Paysley Poplar  [facility] Pager [phone]. If 7PM-7AM, please contact night-coverage at [email or web address], password [email or web address] 08/20/2012, 7:41 AM  LOS: 1 day

## 2012-08-21 ENCOUNTER — Inpatient Hospital Stay (HOSPITAL_COMMUNITY): Payer: Medicare Other

## 2012-08-21 DIAGNOSIS — A4150 Gram-negative sepsis, unspecified: Secondary | ICD-10-CM

## 2012-08-21 DIAGNOSIS — J9600 Acute respiratory failure, unspecified whether with hypoxia or hypercapnia: Secondary | ICD-10-CM

## 2012-08-21 DIAGNOSIS — A419 Sepsis, unspecified organism: Secondary | ICD-10-CM

## 2012-08-21 DIAGNOSIS — N390 Urinary tract infection, site not specified: Secondary | ICD-10-CM

## 2012-08-21 LAB — COMPREHENSIVE METABOLIC PANEL
AST: 28 U/L (ref 0–37)
Albumin: 1.7 g/dL — ABNORMAL LOW (ref 3.5–5.2)
Alkaline Phosphatase: 65 U/L (ref 39–117)
Alkaline Phosphatase: 68 U/L (ref 39–117)
BUN: 60 mg/dL — ABNORMAL HIGH (ref 6–23)
BUN: 54 mg/dL — ABNORMAL HIGH (ref 6–23)
CO2: 23 mEq/L (ref 19–32)
Chloride: 106 mEq/L (ref 96–112)
Creatinine, Ser: 2.54 mg/dL — ABNORMAL HIGH (ref 0.50–1.10)
Creatinine, Ser: 2.34 mg/dL — ABNORMAL HIGH (ref 0.50–1.10)
GFR calc Af Amer: 20 mL/min — ABNORMAL LOW (ref >90)
GFR calc non Af Amer: 17 mL/min — ABNORMAL LOW (ref >90)
Glucose, Bld: 136 mg/dL — ABNORMAL HIGH (ref 70–99)
Potassium: 3.1 mEq/L — ABNORMAL LOW (ref 3.5–5.1)
Potassium: 3 mEq/L — ABNORMAL LOW (ref 3.5–5.1)
Total Bilirubin: 0.2 mg/dL — ABNORMAL LOW (ref 0.3–1.2)
Total Protein: 5.2 g/dL — ABNORMAL LOW (ref 6.0–8.3)

## 2012-08-21 LAB — GI PATHOGEN PANEL BY PCR, STOOL
Cryptosporidium by PCR: NEGATIVE
E coli 0157 by PCR: NEGATIVE
G lamblia by PCR: NEGATIVE
Norovirus GI/GII: NEGATIVE
Rotavirus A by PCR: NEGATIVE
Salmonella by PCR: NEGATIVE
Shigella by PCR: NEGATIVE

## 2012-08-21 LAB — CBC
HCT: 35.6 % — ABNORMAL LOW (ref 36.0–46.0)
MCHC: 31.7 g/dL (ref 30.0–36.0)
MCV: 91.8 fL (ref 78.0–100.0)
Platelets: 243 10*3/uL (ref 150–400)
Platelets: 253 10*3/uL (ref 150–400)
RBC: 3.91 MIL/uL (ref 3.87–5.11)
RDW: 15.4 % (ref 11.5–15.5)
RDW: 15.2 % (ref 11.5–15.5)
WBC: 17.9 10*3/uL — ABNORMAL HIGH (ref 4.0–10.5)

## 2012-08-21 LAB — TROPONIN I
Troponin I: <0.3 ng/mL (ref <0.30)
Troponin I: <0.3 ng/mL (ref <0.30)

## 2012-08-21 LAB — INFLUENZA PANEL BY PCR (TYPE A & B)
H1N1 flu by pcr: NOT DETECTED
Influenza A By PCR: NEGATIVE
Influenza B By PCR: NEGATIVE

## 2012-08-21 LAB — MAGNESIUM: Magnesium: 1.8 mg/dL (ref 1.5–2.5)

## 2012-08-21 LAB — LEGIONELLA ANTIGEN, URINE

## 2012-08-21 MED ORDER — PANTOPRAZOLE SODIUM 40 MG IV SOLR
40.0000 mg | Freq: Two times a day (BID) | INTRAVENOUS | Status: DC
  Administered 2012-08-21 – 2012-08-22 (×4): 40 mg via INTRAVENOUS
  Filled 2012-08-21 (×6): qty 40

## 2012-08-21 MED ORDER — POTASSIUM CHLORIDE CRYS ER 20 MEQ PO TBCR
40.0000 meq | EXTENDED_RELEASE_TABLET | ORAL | Status: AC
  Administered 2012-08-21 (×2): 40 meq via ORAL
  Filled 2012-08-21 (×2): qty 2

## 2012-08-21 MED ORDER — POTASSIUM CHLORIDE 10 MEQ/100ML IV SOLN
10.0000 meq | INTRAVENOUS | Status: AC
  Administered 2012-08-21 (×4): 10 meq via INTRAVENOUS
  Filled 2012-08-21: qty 200
  Filled 2012-08-21 (×2): qty 100

## 2012-08-21 MED ORDER — SODIUM CHLORIDE 0.9 % IV BOLUS (SEPSIS)
250.0000 mL | Freq: Once | INTRAVENOUS | Status: AC
  Administered 2012-08-21: 250 mL via INTRAVENOUS

## 2012-08-21 NOTE — Progress Notes (Signed)
Name: Carla [name] MRN: [id number] DOB: 03/29/1922    LOS: 2  PULMONARY / CRITICAL CARE MEDICINE  HPI:  79 years old female with PMH relevant for dyslipidemia, hiatal hernia. Admitted 2/15 with intractable nausea and vomiting for 5 days. At admission hypotensive, clearly dehydrated, in acute renal failure. CT of the abdomen negative for acute intraabdominal process, bases of the lung with no pneumonia. Over the course of the day she developed mild respiratory distress. Subsequent chest X rays showed RLL infiltrate, possibly secondary to aspiration.  CC consult called 2/16 to assist with management.   Events/studies CT chest 2/17: Bibasilar airspace opacities and nodular opacities within the middle lobe and lingula; favor aspiration pneumonia.  Lines:  Left IJ placed 08/20/12>>>  Cultures:  UC 2/15: ecoli>>> BCX2 2/15>>> mrsa pcr 2/15: neg U strep 2/15: neg  Antibiotics: - Zosyn 2/15>>> - Vancomycin 2/16>>> - Levaquin 2/16>>>  Subjective No distress. Feels a little better  Objective  Vital Signs: Temp:  [97.8 F (36.6 C)-98.9 F (37.2 C)] 98 F (36.7 C) (02/17 0800) Pulse Rate:  [66-132] 69 (02/17 0700) Resp:  [17-34] 22 (02/17 0700) BP: (68-122)/(35-93) 116/67 mmHg (02/17 0700) SpO2:  [95 %-100 %] 98 % (02/17 0700) FiO2 (%):  [35 %-100 %] 35 % (02/17 0700) Weight:  [75.6 kg (166 lb 10.7 oz)] 75.6 kg (166 lb 10.7 oz) (02/17 0500) 35%  Physical Examination: General:  Somnolent. Mild respiratory distress Neuro:  Oriented, nonfocal HEENT:  PERRL, pink conjunctivae, dry mucous membranes Neck:  Supple, no JVD   Cardiovascular:  RRR, no M/R/G Lungs:crackles on bases  Abdomen:  Soft, nontender, nondistended, bowel sounds present Musculoskeletal:  Moves all extremities, no pedal edema Skin:  No rash   Recent Labs Lab 08/20/12 0310 08/21/12 0110 08/21/12 0510  NA 139 135 137  K 4.3 3.1* 3.0*  CL 100 101 106  CO2 27 22 23   BUN 68* 60* 54*  CREATININE 3.38*  2.54* 2.34*  GLUCOSE 126* 265* 136*    Recent Labs Lab 08/20/12 0315 08/21/12 0110 08/21/12 0510  HGB 13.3 11.3* 11.4*  HCT 40.3 35.6* 35.9*  WBC 20.4* 18.1* 17.9*  PLT 240 243 253      Ventilator Settings: Vent Mode:  [-]  FiO2 (%):  [35 %-100 %] 35 % CXR: R>L airspace disease. No sig change when c/w film on 2/16    Principal Problem:   Hypovolemic shock Active Problems:   Iron deficiency anemia   High cholesterol   Hemorrhoids   Intractable nausea and vomiting   Hypotension, unspecified   Dehydration   Atrial fibrillation   Leukocytosis, unspecified   Acute kidney injury   ASSESSMENT AND PLAN  PULMONARY A:   1) RLL pneumonia, questionable aspiration. 2) Hypoxemic respiratory failure P:   - Continue supplemental O2 via NRB - Continue antibiotics  CARDIOVASCULAR A:  1) septic shock: weaning off pressors.  2) Elevated troponin with EKG ischemic changes likely due to demand ischemia. Troponin now normalized 3) Atrial fibrillation with RVR Rate controlled. Will need to decide of anticoagulation at time of d/c if remains in AF.  P:  - Will continue IVF resuscitation  - keep fluid status - Will continue amiodarone drip  RENAL  A:   1) Acute renal failure, likely pre renal. Slightly improved 2) hypokalemia  P:   - Will continue IVF resuscitation - replace lytes as indicated  - Will monitor CMP   GASTROINTESTINAL A:   1) Hiatal hernia 2) Mild hematemesis after intractable  vomit, felt to be d/t recent viral illness. Diff dx include: gastritis and  Mallory Weis but have to consider esophageal perforation in the differential. P:   - GI prophylaxis with protonix, will switch to bid PPI - Will treat nausea - cl liq diet    HEMATOLOGIC A:   1) mild anemia, no evidence of bleeding  P:  Will follow CBC  INFECTIOUS A:   1) Sepsis likely secondary to pneumonia P:   - Continue current antibiotics, see dashboard -f/u culture data    ENDOCRINE A:   1) No issues  NEUROLOGIC A:   1) Somnolence, likely medication effect + hypotenesion and sepsis >resolved  P:   - Will monitor closely.      08/21/2012, 8:28 AM PEte Tanja Port NP   STAFF NOTE: I, Dr Lavinia Sharps have personally reviewed patient's available data, including medical history, events of note, physical examination and test results as part of my evaluation. I have discussed with resident/NP and other care providers such as pharmacist, RN and RRT.  In addition,  I personally evaluated patient and elicited key findings of septic shock secondary to urinary tract infection and aspiration pneumonia. She is improved in terms of oxygenation and also with vasopressor need. The CVP 7 and I ordered 250 cc saline bolus. If ejection fraction is good on echo can give more fluids.  Rest per NP/medical resident whose note is outlined above and that I agree with  The patient is critically ill with multiple organ systems failure and requires high complexity decision making for assessment and support, frequent evaluation and titration of therapies, application of advanced monitoring technologies and extensive interpretation of multiple databases.   Critical Care Time devoted to patient care services described in this note is  31  Minutes independent of nurse practitioner time  Dr. Kalman Shan, M.D., [facility].C.P Pulmonary and Critical Care Medicine Staff Physician [facility] Pager: [phone], If no answer or between  15:00h - 7:00h: call [phone]  08/21/2012 11:21 AM

## 2012-08-21 NOTE — Progress Notes (Signed)
Echocardiogram 2D Echocardiogram has been performed.  Carla [name] 08/21/2012, 9:29 AM 

## 2012-08-21 NOTE — Evaluation (Signed)
Called at bedside to assess Mrs Reeb for possible intubation.  I found the patient awake, alert, oriented X 3, breathing 21/min, only mild respiratory distress, O2 sat 100% on 100% NRB, MAP: 77 on phenylephrine at 130 mcg/min. CT scan of the chest with bilateral dense infiltrates to the lower lobes. No evidence of pneumomediastinum.  - No solid indication for intubation at the present time. - Still may need intubation if further deterioration. - Daughter and son updated at bedside.  - Patient is full code - We will continue close monitoring.  Overton Mam, MD [facility] PCCM

## 2012-08-21 NOTE — Progress Notes (Signed)
Nutrition Brief Note  Patient identified on the Malnutrition Screening Tool (MST) Report  Body mass index is 30.48 kg/(m^2). Patient meets criteria for Obese based on current BMI.   Pt admitted for nausea and vomiting that continued for 5 days. Pt was found to be hypotensive and dehydrated with acute renal failure. Current diet order is Clear Liquids, patient is consuming approximately 100% of meals at this time. Pt states that her usual body weight is 165 lbs. Pt was weighed at 166 lbs on 2/17. It denies wt loss and states she usually has a good appetite at home; eats breakfast and dinner daily. Per pt nausea and vomiting is improving.  Labs and medications reviewed.   No nutrition interventions warranted at this time. If nutrition issues arise, please consult RD.   Ian Malkin RD, LDN Inpatient Clinical Dietitian Pager: [phone] After Hours Pager: [phone]

## 2012-08-21 NOTE — Consult Note (Addendum)
Name: Carla [name] MRN: [id number] DOB: 03/16/1922    LOS: 2  PULMONARY / CRITICAL CARE MEDICINE  HPI:  79 years old female with PMH relevant for dyslipidemia, hiatal hernia. Admitted yesterday by hospitalist team with intractable nausea and vomiting for 5 days. At admission hypotensive, clearly dehydrated, in acute renal failure. CT of the abdomen negative for acute intraabdominal process, bases of the lung with no pneumonia. Over the course of the day she developed mild respiratory distress. Subsequent chest X rays showed RLL infiltrate, possibly secondary to aspiration. Over the last few hours her BP dropped and she became somnolent. CC consult called to assist with management. At the time of my exam the patient is somnolent but arousable, able to answer questions, MAP in the 50's, saturating 98% on NRB.  Past Medical History  Diagnosis Date  . Iron deficiency anemia   . High cholesterol   . Osteoarthritis   . Female bladder prolapse   . Osteoporosis   . Cataracts, bilateral   . Gallstones   . Hemorrhoids   . Diverticulosis   . Hiatal hernia    Past Surgical History  Procedure Laterality Date  . Cholecystectomy    . Cataract extraction     Prior to Admission medications   Medication Sig Start Date End Date Taking? Authorizing Provider  aspirin EC 81 MG tablet Take 81 mg by mouth daily.   Yes Historical Provider, MD  calcium-vitamin D (OSCAL WITH D) 500-200 MG-UNIT per tablet Take 1 tablet by mouth daily.   Yes Historical Provider, MD  cholecalciferol (VITAMIN D) 1000 UNITS tablet Take 1,000 Units by mouth daily.   Yes Historical Provider, MD  conjugated estrogens (PREMARIN) vaginal cream Place 1 g vaginally 3 (three) times a week.   Yes Historical Provider, MD  ferrous sulfate 325 (65 FE) MG tablet Take 325 mg by mouth daily with breakfast.   Yes Historical Provider, MD  NIACIN CR PO Take 1 tablet by mouth daily.   Yes Historical Provider, MD  vitamin C (ASCORBIC ACID) 500  MG tablet Take 500 mg by mouth daily.   Yes Historical Provider, MD   Allergies No Known Allergies  Family History Family History  Problem Relation Age of Onset  . Breast cancer Paternal Aunt   . Breast cancer Paternal Aunt   . Cancer      several family members on dad's side of family   Social History  reports that she has never smoked. She does not have any smokeless tobacco history on file. She reports that she does not drink alcohol or use illicit drugs.  Review Of Systems:  Unable to provide.   Vital Signs: Temp:  [97.7 F (36.5 C)-98.9 F (37.2 C)] 98.9 F (37.2 C) (02/16 2009) Pulse Rate:  [92-132] 92 (02/16 2300) Resp:  [18-34] 18 (02/16 2300) BP: (68-114)/(35-85) 97/46 mmHg (02/16 2300) SpO2:  [93 %-100 %] 100 % (02/16 2300) FiO2 (%):  [50 %-100 %] 100 % (02/16 1930) Weight:  [159 lb 6.3 oz (72.3 kg)] 159 lb 6.3 oz (72.3 kg) (02/16 0500)  Physical Examination: General:  Somnolent. Mild respiratory distress Neuro:  Oriented, nonfocal HEENT:  PERRL, pink conjunctivae, dry mucous membranes Neck:  Supple, no JVD   Cardiovascular:  RRR, no M/R/G Lungs:  Diminished air entry to the right base with some scattered crackles, no wheezing. Abdomen:  Soft, nontender, nondistended, bowel sounds present Musculoskeletal:  Moves all extremities, no pedal edema Skin:  No rash  I have reviewed the  labs and personally reviewed the radiology imaging since admission.  Results for orders placed during the hospital encounter of 08/19/12 (from the past 24 hour(s))  COMPREHENSIVE METABOLIC PANEL     Status: Abnormal   Collection Time    08/20/12  3:10 AM      Result Value Range   Sodium 139  135 - 145 mEq/L   Potassium 4.3  3.5 - 5.1 mEq/L   Chloride 100  96 - 112 mEq/L   CO2 27  19 - 32 mEq/L   Glucose, Bld 126 (*) 70 - 99 mg/dL   BUN 68 (*) 6 - 23 mg/dL   Creatinine, Ser 1.47 (*) 0.50 - 1.10 mg/dL   Calcium 7.5 (*) 8.4 - 10.5 mg/dL   Total Protein 6.0  6.0 - 8.3 g/dL    Albumin 2.4 (*) 3.5 - 5.2 g/dL   AST 26  0 - 37 U/L   ALT 16  0 - 35 U/L   Alkaline Phosphatase 74  39 - 117 U/L   Total Bilirubin 0.4  0.3 - 1.2 mg/dL   GFR calc non Af Amer 11 (*) >90 mL/min   GFR calc Af Amer 13 (*) >90 mL/min  CBC     Status: Abnormal   Collection Time    08/20/12  3:15 AM      Result Value Range   WBC 20.4 (*) 4.0 - 10.5 K/uL   RBC 4.45  3.87 - 5.11 MIL/uL   Hemoglobin 13.3  12.0 - 15.0 g/dL   HCT 82.9  56.2 - 13.0 %   MCV 90.6  78.0 - 100.0 fL   MCH 29.9  26.0 - 34.0 pg   MCHC 33.0  30.0 - 36.0 g/dL   RDW 86.5  78.4 - 69.6 %   Platelets 240  150 - 400 K/uL  MAGNESIUM     Status: None   Collection Time    08/20/12  9:05 AM      Result Value Range   Magnesium 2.0  1.5 - 2.5 mg/dL  STREP PNEUMONIAE URINARY ANTIGEN     Status: None   Collection Time    08/20/12  4:54 PM      Result Value Range   Strep Pneumo Urinary Antigen NEGATIVE  NEGATIVE    Ventilator Settings: Vent Mode:  [-]  FiO2 (%):  [50 %-100 %] 100 % CXR:  RLL infiltrate, possible small pleural effusion ECG:  A. Fib. About 1 mm ST depression in lateral and inferior leads.  Lines: Left IJ placed 08/20/12 Cultures: Pending Antibiotics: - Zosyn - Vancomycin - Levaquin   Principal Problem:   Hypovolemic shock Active Problems:   Iron deficiency anemia   High cholesterol   Hemorrhoids   Intractable nausea and vomiting   Hypotension, unspecified   Dehydration   Atrial fibrillation   Leukocytosis, unspecified   Acute kidney injury   ASSESSMENT AND PLAN  PULMONARY A:   1) RLL pneumonia, questionable aspiration. 2) Hypoxemic respiratory failure P:   - Will continue care by PCCM - Continue supplemental O2 via NRB - Continue antibiotics - May need intubation if continue to deteriorate. Discussed with daughter.   CARDIOVASCULAR   A:  1) Hypotensive likely secondary to hypovolemia vs sepsis 2) Elevated troponin with EKG ischemic changes likely due to demand ischemia. Troponin  trending down. 3) Lactic acid 1.6 4) Atrial fibrillation with RVR P:  - Central line placed - Will continue IVF resuscitation  - Will monitor CVP - Will start  pressors to a MAP goal of 65 - Will repeat lactic acid. - Will continue amiodarone drip  RENAL  A:   1) Acute renal failure, likely pre renal. Slightly improved P:   - No indication for dialysis - Will continue IVF resuscitation - Will monitor CMP  GASTROINTESTINAL A:   1) Hiatal hernia 2) Mild hematemesis after intractable vomit. Likely Mallory Judeth Porch but have to consider esophageal perforation in the differential. P:   - GI prophylaxis with protonix - Will treat nausea - Consider contrast esophagogram if continues to deteriorate   HEMATOLOGIC A:   1) No issues P:  Will follow CBC  INFECTIOUS A:   1) Sepsis likely secondary to pneumonia P:   - Continue current antibiotics   - Zosyn   - Vancomycin   - Levaquin - Will follow cultures  eNDOCRINE A:   1) No issues  NEUROLOGIC A:   1) Somnolence, likely medication effect + hypotenesion and sepsis P:   - Will monitor closely.   BEST PRACTICE / DISPOSITION - Level of Care:  ICU - Primary Service:  PCCM - Consultants:  None - Code Status:  Full code - Diet:  NPO - DVT Px:  SCD's - GI Px:  Protonix - Skin Integrity: Intact - Social / Family:  Daughter updated at bedside.  The patient is critically ill with multiple organ systems failure and requires high complexity decision making for assessment and support, frequent evaluation and titration of therapies, application of advanced monitoring technologies and extensive interpretation of multiple databases.   Critical Care Time devoted to patient care services described in this note is: 1 Hour  Overton Mam, M.D. Pulmonary and Critical Care Medicine [facility] Pager: [phone]  08/21/2012, 12:11 AM

## 2012-08-22 ENCOUNTER — Encounter (HOSPITAL_COMMUNITY): Payer: Self-pay

## 2012-08-22 LAB — COMPREHENSIVE METABOLIC PANEL
ALT: 14 U/L (ref 0–35)
AST: 21 U/L (ref 0–37)
Albumin: 1.6 g/dL — ABNORMAL LOW (ref 3.5–5.2)
Calcium: 7.7 mg/dL — ABNORMAL LOW (ref 8.4–10.5)
Chloride: 110 mEq/L (ref 96–112)
Creatinine, Ser: 2.05 mg/dL — ABNORMAL HIGH (ref 0.50–1.10)
Sodium: 142 mEq/L (ref 135–145)
Total Bilirubin: 0.2 mg/dL — ABNORMAL LOW (ref 0.3–1.2)

## 2012-08-22 LAB — CBC
Hemoglobin: 10.6 g/dL — ABNORMAL LOW (ref 12.0–15.0)
MCH: 29.5 pg (ref 26.0–34.0)
MCV: 90.8 fL (ref 78.0–100.0)
RBC: 3.59 MIL/uL — ABNORMAL LOW (ref 3.87–5.11)

## 2012-08-22 LAB — URINE CULTURE

## 2012-08-22 MED ORDER — MAGNESIUM SULFATE IN D5W 10-5 MG/ML-% IV SOLN
1.0000 g | Freq: Once | INTRAVENOUS | Status: AC
  Administered 2012-08-22: 1 g via INTRAVENOUS
  Filled 2012-08-22: qty 100

## 2012-08-22 MED ORDER — MAGNESIUM SULFATE 50 % IJ SOLN
1.0000 g | Freq: Once | INTRAVENOUS | Status: DC
  Filled 2012-08-22: qty 2

## 2012-08-22 NOTE — Progress Notes (Signed)
Name: Carla [name] MRN: [id number] DOB: 01-25-1922    LOS: 3  PULMONARY / CRITICAL CARE MEDICINE  HPI:  79 years old female with PMH relevant for dyslipidemia, hiatal hernia. Admitted 2/15 with intractable nausea and vomiting for 5 days. At admission hypotensive, clearly dehydrated, in acute renal failure. CT of the abdomen negative for acute intraabdominal process, bases of the lung with no pneumonia. Over the course of the day she developed mild respiratory distress. Subsequent chest X rays showed RLL infiltrate, possibly secondary to aspiration.  CC consult called 2/16 to assist with management.   Events/studies CT chest 2/17: Bibasilar airspace opacities and nodular opacities within the middle lobe and lingula; favor aspiration pneumonia.  Lines:  Left IJ placed 08/20/12>>>  Cultures:  UC 2/15: ecoli - PAN SENSITIVE BCX2 2/15>>> mrsa pcr 2/15: neg U strep 2/15: neg  Antibiotics: - Zosyn 2/15>>> 08/22/12 - Vancomycin 2/16>>>08/22/12 - Levaquin 2/16>>> (8 days)     SUBJECTIVE/OVERNIGHT/INTERVAL HX 08/22/2012: She is off pressors for 24 hours. EKG shows sinus rhythm while on amiodarone  Objective  Vital Signs: Temp:  [97.4 F (36.3 C)-98.7 F (37.1 C)] 97.7 F (36.5 C) (02/18 0800) Pulse Rate:  [52-81] 76 (02/18 0600) Resp:  [16-27] 21 (02/18 0600) BP: (96-133)/(46-92) 126/57 mmHg (02/18 0600) SpO2:  [97 %-100 %] 100 % (02/18 0600) 35%  Physical Examination: General:  Looking much better. Sitting in the chair. Has only complained of chronic shoulder pain from scoliosis  Neuro:  Oriented, nonfocal. RASS sedation score 0. CAM-ICU negative for delirium. Neurologically intact. Sitting in chair HEENT:  PERRL, pink conjunctivae, dry mucous membranes Neck:  Supple, no JVD   Cardiovascular:  RRR, no M/R/G Lungs:crackles on bases  Abdomen:  Soft, nontender, nondistended, bowel sounds present Musculoskeletal:  Moves all extremities, no pedal edema. Baseline  scoliosis Skin:  No rash   Recent Labs Lab 08/21/12 0110 08/21/12 0510 08/22/12 0530  NA 135 137 142  K 3.1* 3.0* 4.1  CL 101 106 110  CO2 22 23 23   BUN 60* 54* 44*  CREATININE 2.54* 2.34* 2.05*  GLUCOSE 265* 136* 90    Recent Labs Lab 08/21/12 0110 08/21/12 0510 08/22/12 0530  HGB 11.3* 11.4* 10.6*  HCT 35.6* 35.9* 32.6*  WBC 18.1* 17.9* 14.7*  PLT 243 253 197      Imaging x48 hours Ct Chest Wo Contrast  08/21/2012  *RADIOLOGY REPORT*  Clinical Data: Worsening infiltrate and effusion.  CT CHEST WITHOUT CONTRAST  Technique:  Multidetector CT imaging of the chest was performed following the standard protocol without IV contrast.  Comparison: 08/20/2012 radiograph  Findings: Aortic atherosclerosis and tortuosity.  Heart size upper normal.  Aortic valve and coronary artery calcifications.  Left IJ central venous catheter tip projects at the cavoatrial junction.  Large hiatal hernia with organoaxial rotation.  Small bilateral pleural effusions.  Bibasilar airspace opacities and small effusions.  Clustered nodularity within the right middle lobe and lingula and to a lesser extent the right upper lobe.  No pneumothorax.  No pneumomediastinum.  Limited images through the upper abdomen show no acute finding. Colonic diverticulosis. Elevated right hemidiaphragm.  Multilevel degenerative changes.  No acute osseous finding. Exaggerated kyphosis.  IMPRESSION: Bibasilar airspace opacities and nodular opacities within the middle lobe and lingula; favor aspiration pneumonia.  Small effusions.  Large hiatal hernia with organoaxial rotation.   Original Report Authenticated By: Jearld Lesch, M.D.    Dg Chest Port 1 View  08/21/2012  *RADIOLOGY REPORT*  Clinical Data: Evaluate  for pneumonia.Pleural effusions.  PORTABLE CHEST - 1 VIEW  Comparison: Chest x-ray 08/20/2012.  Findings: Lung volumes have slightly improved.  Extensive bibasilar opacities may reflect areas of atelectasis and/or  consolidation with superimposed small to moderate bilateral pleural effusions (right greater than left).  Patchy interstitial and airspace opacities are also noted in the right mid and upper lung as well. No evidence of pulmonary edema.  Heart size is within normal limits. The patient is rotated to the right on today's exam, resulting in distortion of the mediastinal contours and reduced diagnostic sensitivity and specificity for mediastinal pathology. Atherosclerosis in the thoracic aorta. There is a left-sided internal jugular central venous catheter with tip terminating in the superior cavoatrial junction.  IMPRESSION: 1.  Allowing for slight differences in patient positioning, and slight improvement in overall lung volumes, the radiographic appearance of chest is essentially unchanged, as above.   Original Report Authenticated By: Trudie Reed, M.D.    Dg Chest Port 1 View  08/20/2012  *RADIOLOGY REPORT*  Clinical Data: Central line placement  PORTABLE CHEST - 1 VIEW  Comparison: 08/20/2012  Findings: Degraded by rotation.  Interval left IJ catheter placed with tip projecting over the mid SVC. No pneumothorax.  Increased bilateral pleural effusions.  Patchy airspace opacities bilaterally, right greater than left.  Cardiomediastinal contours are obscured.  There is suggestion of a hiatal hernia. Atherosclerotic disease.  Osteopenia.  IMPRESSION: Interval left IJ catheter placed with tip projecting over the mid SVC.  No pneumothorax.  Increased bilateral pleural effusions and airspace opacities; infection versus edema.   Original Report Authenticated By: Jearld Lesch, M.D.    Dg Chest Port 1 View  08/20/2012  *RADIOLOGY REPORT*  Clinical Data: Hypoxia  PORTABLE CHEST - 1 VIEW  Comparison: 08/20/2012  Findings: Large hiatal hernia is stable.  Airspace opacities at the right base have increased.  Minimal atelectasis at the left base. No pneumothorax.  Degenerative changes of the right glenohumeral joint  are noted. Increased opacity over the right first rib may simply represent confluence of shadows.  Increasing airspace disease is not excluded.  IMPRESSION: Increasing airspace disease at the right base.  Possible increasing airspace disease in the right upper lobe.   Original Report Authenticated By: Jolaine Click, M.D.       CXR: R>L airspace disease. No sig change when c/w film on 2/16    Principal Problem:   Hypovolemic shock Active Problems:   Iron deficiency anemia   High cholesterol   Hemorrhoids   Intractable nausea and vomiting   Hypotension, unspecified   Dehydration   Atrial fibrillation   Leukocytosis, unspecified   Acute kidney injury   ASSESSMENT AND PLAN  PULMONARY A:   1) RLL pneumonia, questionable aspiration. 2) Hypoxemic respiratory failure - 08/22/2012: Down to 3 L oxygen P:   - Continue supplemental O2 via NRB - Continue antibiotics - Aggressive pulmonary toilet and mobilization  CARDIOVASCULAR   Recent Labs Lab 08/19/12 1316 08/19/12 1951 08/21/12 0110 08/21/12 0510 08/21/12 1205  TROPONINI 0.47* 0.34* <0.30 <0.30 <0.30  No results found for this basename: PROBNP,  in the last 168 hours   A:  1) septic shock: weaning off pressors.  2) Elevated troponin with EKG ischemic changes likely due to demand ischemia. Troponin now normalized 3) Atrial fibrillation with RVR at admission  4) echo 08/21/2012 with pulmonary artery systolic pressure of 50 with mild RV dilation and grade 2 left ventricular diastolic dysfunction  - 08/22/2012:  sinus rhythm. P:  -  Stop amiodarone infusion - Check BNP  RENAL  Recent Labs Lab 08/19/12 1851 08/20/12 0310 08/20/12 0905 08/21/12 0110 08/21/12 0510 08/22/12 0530  NA 138 139  --  135 137 142  K 3.7 4.3  --  3.1* 3.0* 4.1  CL 95* 100  --  101 106 110  CO2 26 27  --  22 23 23   GLUCOSE 153* 126*  --  265* 136* 90  BUN 73* 68*  --  60* 54* 44*  CREATININE 3.59* 3.38*  --  2.54* 2.34* 2.05*  CALCIUM  7.6* 7.5*  --  7.0* 7.3* 7.7*  MG  --   --  2.0 1.8  --  1.9  PHOS  --   --   --   --   --  2.3    A:   1) Acute renal failure due to sepsis - 08/22/2012: Improved renal insufficiency. Mild hypomagnesemia    P:   - Replace magnesium  GASTROINTESTINAL A:   1) Hiatal hernia 2) Mild hematemesis after intractable vomit, felt to be d/t recent viral illness. Diff dx include: gastritis and  Mallory Judeth Porch but have to consider esophageal perforation in the differential. - 08/22/2012 no nausea or vomiting blood or any GI symptoms P:   - Stop scheduled Zofran - Regular diet restrict salt -   HEMATOLOGIC  Recent Labs Lab 08/21/12 0110 08/21/12 0510 08/22/12 0530  HGB 11.3* 11.4* 10.6*  HCT 35.6* 35.9* 32.6*  WBC 18.1* 17.9* 14.7*  PLT 243 253 197    A:   1) mild anemia, no evidence of bleeding  P:  Will follow CBC - PRBC for hgb </= 6.9gm%    - exceptions are   -  if ACS susepcted/confirmed then transfuse for hgb </= 8.0gm%,  or    -  If septic shock first 24h and scvo2 < 70% then transfuse for hgb </= 9.0gm%   - active bleeding with hemodynamic instability, then transfuse regardless of hemoglobin value   At at all times try to transfuse 1 unit prbc as possible with exception of active hemorrhage    INFECTIOUS A:   1) Sepsis likely secondary to pneumonia and Escherichia coli UTI P:   - Narrow all antibiotics Levaquin and complete 8 days   ENDOCRINE A:   1) No issues  NEUROLOGIC A:   1) Somnolence, likely medication effect + hypotenesion and sepsis - Resolved on 08/21/2012 P:   - Will monitor closely.   Global  - Son and his wife updated. Change to step down status 08/22/2012     Dr. Kalman Shan, M.D., [facility].C.P Pulmonary and Critical Care Medicine Staff Physician [facility] [place] [facility] and Critical [facility] Pager: [phone], If no answer or between  15:00h - 7:00h: call [phone]  08/22/2012 10:50 AM

## 2012-08-23 DIAGNOSIS — N390 Urinary tract infection, site not specified: Secondary | ICD-10-CM | POA: Diagnosis present

## 2012-08-23 DIAGNOSIS — A4150 Gram-negative sepsis, unspecified: Secondary | ICD-10-CM | POA: Diagnosis present

## 2012-08-23 LAB — COMPREHENSIVE METABOLIC PANEL
ALT: 14 U/L (ref 0–35)
AST: 17 U/L (ref 0–37)
Albumin: 1.7 g/dL — ABNORMAL LOW (ref 3.5–5.2)
BUN: 33 mg/dL — ABNORMAL HIGH (ref 6–23)
Chloride: 110 mEq/L (ref 96–112)
Glucose, Bld: 117 mg/dL — ABNORMAL HIGH (ref 70–99)
Potassium: 3.9 mEq/L (ref 3.5–5.1)

## 2012-08-23 LAB — CBC
HCT: 33.5 % — ABNORMAL LOW (ref 36.0–46.0)
Hemoglobin: 10.6 g/dL — ABNORMAL LOW (ref 12.0–15.0)
MCH: 29 pg (ref 26.0–34.0)
MCHC: 31.6 g/dL (ref 30.0–36.0)
MCV: 91.8 fL (ref 78.0–100.0)
RBC: 3.65 MIL/uL — ABNORMAL LOW (ref 3.87–5.11)

## 2012-08-23 LAB — MAGNESIUM: Magnesium: 1.6 mg/dL (ref 1.5–2.5)

## 2012-08-23 LAB — CLOSTRIDIUM DIFFICILE BY PCR: Toxigenic C. Difficile by PCR: NEGATIVE

## 2012-08-23 MED ORDER — RISAQUAD PO CAPS
1.0000 | ORAL_CAPSULE | Freq: Every day | ORAL | Status: DC
  Administered 2012-08-23 – 2012-08-27 (×5): 1 via ORAL
  Filled 2012-08-23 (×5): qty 1

## 2012-08-23 MED ORDER — SODIUM PHOSPHATE 3 MMOLE/ML IV SOLN
30.0000 mmol | Freq: Once | INTRAVENOUS | Status: AC
  Administered 2012-08-23: 30 mmol via INTRAVENOUS
  Filled 2012-08-23: qty 10

## 2012-08-23 MED ORDER — AMIODARONE HCL 200 MG PO TABS
200.0000 mg | ORAL_TABLET | Freq: Two times a day (BID) | ORAL | Status: DC
  Administered 2012-08-23 – 2012-08-27 (×8): 200 mg via ORAL
  Filled 2012-08-23 (×11): qty 1

## 2012-08-23 MED ORDER — FUROSEMIDE 20 MG PO TABS
20.0000 mg | ORAL_TABLET | Freq: Every day | ORAL | Status: DC
  Administered 2012-08-23 – 2012-08-27 (×5): 20 mg via ORAL
  Filled 2012-08-23 (×5): qty 1

## 2012-08-23 MED ORDER — POTASSIUM CHLORIDE CRYS ER 10 MEQ PO TBCR
10.0000 meq | EXTENDED_RELEASE_TABLET | Freq: Every day | ORAL | Status: DC
  Administered 2012-08-23 – 2012-08-27 (×5): 10 meq via ORAL
  Filled 2012-08-23 (×5): qty 1

## 2012-08-23 MED ORDER — FAMOTIDINE 10 MG PO TABS
10.0000 mg | ORAL_TABLET | Freq: Every day | ORAL | Status: DC
  Administered 2012-08-23 – 2012-08-26 (×4): 10 mg via ORAL
  Filled 2012-08-23 (×6): qty 1

## 2012-08-23 MED ORDER — PHENAZOPYRIDINE HCL 100 MG PO TABS: 100.0000 mg | ORAL_TABLET | Freq: Three times a day (TID) | ORAL | Status: DC

## 2012-08-23 NOTE — Progress Notes (Signed)
PT Cancellation Note  Patient Details Name: Carla [name] MRN: [id number] DOB: [birth year]   Cancelled Treatment:    Reason Eval/Treat Not Completed: Other (comment) Pt just returned to bed and worried about diarrhea in hallway with ambulation so declined PT earlier and sleeping upon checking back with pt (family member requested to let pt sleep).   Pinky Ravan,KATHrine E 08/23/2012, 3:44 PM Pager: [phone]

## 2012-08-23 NOTE — Progress Notes (Signed)
eLink Physician-Brief Progress Note Patient Name: Carla [name] DOB: [birth year] MRN: [id number]  Date of Service  08/23/2012   HPI/Events of Note   Apparently foley was placed instead of I&O cath. She has excoriated skin, requests foley to stay in. Also note renal insufficiency - pharmacy points out that pyridium shouldn't be used in this settingt  eICU Interventions  Continue foley D/c pyridium   Intervention Category Intermediate Interventions: Oliguria - evaluation and management  Carla [name] S. 08/23/2012, 9:51 PM

## 2012-08-23 NOTE — Progress Notes (Signed)
eLink Physician-Brief Progress Note Patient Name: Carla [name] DOB: 08-01-1921 MRN: [id number]  Date of Service  08/23/2012   HPI/Events of Note   Pt back in A fib off amiodarone   eICU Interventions  Start amiodarone PO and follow. Currently rate in the 110's   Intervention Category Intermediate Interventions: Arrhythmia - evaluation and management  Uchenna Seufert S. 08/23/2012, 5:40 PM

## 2012-08-23 NOTE — Progress Notes (Addendum)
Name: Carla [name] MRN: [id number] DOB: 12-Dec-1921    LOS: 4  PULMONARY / CRITICAL CARE MEDICINE  HPI:  79 years old female with PMH relevant for dyslipidemia, hiatal hernia. Admitted 2/15 with intractable nausea and vomiting for 5 days. At admission hypotensive, clearly dehydrated, in acute renal failure. CT of the abdomen negative for acute intraabdominal process, bases of the lung with no pneumonia. Over the course of the day she developed mild respiratory distress. Subsequent chest X rays showed RLL infiltrate, possibly secondary to aspiration.  CC consult called 2/16 to assist with management.   Events/studies CT chest 2/17: Bibasilar airspace opacities and nodular opacities within the middle lobe and lingula; favor aspiration pneumonia.  Lines:  Left IJ placed 08/20/12>>>  Cultures:  UC 2/15: ecoli - PAN SENSITIVE BCX2 2/15>>> mrsa pcr 2/15: neg U strep 2/15: neg Stool evaluation for various toxins 08/20/2012: Negative C. difficile PCR 08/23/2012 >>  Antibiotics: - Zosyn 2/15>>> 08/23/12 - Vancomycin 2/16>>>08/23/12 - Levaquin 2/16>>> (8 days)     SUBJECTIVE/OVERNIGHT/INTERVAL HX 08/23/2012: Off pressors for 2 days with stable BP, off amiodarone for 24 hours currently in NSR on monitor. Complains of intractable diarrhea [attending physician statement: Patient is currently on Protonix but was also noted that at admission she had diarrhea at this time on 08/20/2012 C. difficile toxin was negative  Objective  Vital Signs: Temp:  [97.7 F (36.5 C)-98.9 F (37.2 C)] 98.9 F (37.2 C) (02/19 0400) Pulse Rate:  [64-79] 64 (02/19 0000) Resp:  [16-39] 39 (02/19 0600) BP: (91-153)/(35-84) 132/60 mmHg (02/19 0600) SpO2:  [90 %-99 %] 94 % (02/19 0600) Weight:  [77.9 kg (171 lb 11.8 oz)] 77.9 kg (171 lb 11.8 oz) (02/19 0400) 2 Liters [place] Physical Examination: General: Sitting in the chair.  Neuro:  Oriented, nonfocal. RASS sedation score 0. Neurologically intact. Sitting in  chair HEENT:  PERRL, pink conjunctivae, dry mucous membranes Neck:  Supple, no JVD   Cardiovascular:  RRR, no M/R/G Lungs: diminished bilateral bases. Abdomen:  Soft, nontender, nondistended, bowel sounds present Musculoskeletal:  Moves all extremities, no pedal edema. Baseline scoliosis Skin:  No rash   Recent Labs Lab 08/21/12 0510 08/22/12 0530 08/23/12 0418  NA 137 142 140  K 3.0* 4.1 3.9  CL 106 110 110  CO2 23 23 24   BUN 54* 44* 33*  CREATININE 2.34* 2.05* 1.68*  GLUCOSE 136* 90 117*    Recent Labs Lab 08/21/12 0510 08/22/12 0530 08/23/12 0418  HGB 11.4* 10.6* 10.6*  HCT 35.9* 32.6* 33.5*  WBC 17.9* 14.7* 14.4*  PLT 253 197 246      Imaging x48 hours No results found.    CXR: R>L airspace disease. No sig change when c/w film on 2/16    Principal Problem:   Gram negative sepsis Active Problems:   Urinary tract infection   Iron deficiency anemia   High cholesterol   Hemorrhoids   Intractable nausea and vomiting   Hypotension, unspecified   Dehydration   Atrial fibrillation   Leukocytosis, unspecified   Acute kidney injury   Hypovolemic shock   ASSESSMENT AND PLAN  PULMONARY A:   1) RLL pneumonia, questionable aspiration. 2) Hypoxemic respiratory failure - 08/22/2012: Down to 2 L oxygen, 02 sat 96.  P:   - Continue supplemental O2 via NRB - Continue antibiotics - Aggressive pulmonary toilet and mobilization  CARDIOVASCULAR    Recent Labs Lab 08/19/12 1316 08/19/12 1951 08/21/12 0110 08/21/12 0510 08/21/12 1205  TROPONINI 0.47* 0.34* <0.30 <0.30 <0.30  Recent Labs Lab 08/23/12 0418  PROBNP 7669.0*     A:  1) Septic shock: Now off pressors for 48 hours. 2) Elevated troponin with EKG ischemic changes likely due to demand ischemia. Troponin now normalized 3) Atrial fibrillation with RVR at admission  - Currently NSR on monitor with no amiodarone.  4) echo 08/21/2012 with pulmonary artery systolic pressure of 50 with  mild RV dilation and grade 2 left ventricular diastolic dysfunction  - 08/22/2012:  sinus rhythm. P:  -Continue tele monitoring - Check BNP - Start low-dose Lasix 08/23/2012 with potassium replacement [this is an attending physician statement]  RENAL  Recent Labs Lab 08/20/12 0310 08/20/12 0905 08/21/12 0110 08/21/12 0510 08/22/12 0530 08/23/12 0418  NA 139  --  135 137 142 140  K 4.3  --  3.1* 3.0* 4.1 3.9  CL 100  --  101 106 110 110  CO2 27  --  22 23 23 24   GLUCOSE 126*  --  265* 136* 90 117*  BUN 68*  --  60* 54* 44* 33*  CREATININE 3.38*  --  2.54* 2.34* 2.05* 1.68*  CALCIUM 7.5*  --  7.0* 7.3* 7.7* 7.7*  MG  --  2.0 1.8  --  1.9 1.6  PHOS  --   --   --   --  2.3 2.0*    A:   1) Acute renal failure due to sepsis - 08/23/2012: Improved renal insufficiency.    P:   - Supportive Care -  M monitor electrolytes    GASTROINTESTINAL A:   1) Hiatal hernia 2) Mild hematemesis after intractable vomit, felt to be d/t recent viral illness. Diff dx include: gastritis and  Mallory Judeth Porch but have to consider esophageal perforation in the differential. - 08/22/2012 no nausea or vomiting blood. 3- Diarrhea: Black watery diarrhea noted today, has not changed from 2/16 when C-diff was negative. P:   - Recheck c-diff but using PCR technology - Probiotic - Regular diet restrict salt - Change Protonix to low-dose Pepcid   HEMATOLOGIC  Recent Labs Lab 08/21/12 0510 08/22/12 0530 08/23/12 0418  HGB 11.4* 10.6* 10.6*  HCT 35.9* 32.6* 33.5*  WBC 17.9* 14.7* 14.4*  PLT 253 197 246    A:   1) mild anemia, no evidence of bleeding  P:  Will follow CBC - PRBC for hgb </= 6.9gm%    - exceptions are   -  if ACS susepcted/confirmed then transfuse for hgb </= 8.0gm%,  or    -  If septic shock first 24h and scvo2 < 70% then transfuse for hgb </= 9.0gm%   - active bleeding with hemodynamic instability, then transfuse regardless of hemoglobin value   At at all times try to  transfuse 1 unit prbc as possible with exception of active hemorrhage    INFECTIOUS A:   1) Sepsis likely secondary to pneumonia and Escherichia coli UTI P:   - Continue Levaquin   ENDOCRINE A:   1) No issues  NEUROLOGIC A:   1) Somnolence, likely medication effect + hypotenesion and sepsis - Resolved on 08/21/2012 P:   - Will monitor closely.   Ready for transfer to medical ward. [Will have triad hospitalist assume primary service, this is an attending physician statement]  08/23/2012 8:34 AM  Note by Anders Simmonds nurse practitioner    STAFF NOTE: I, Dr Lavinia Sharps have personally reviewed patient's available data, including medical history, events of note, physical examination and test results as part of  my evaluation. I have discussed with resident/NP and other care providers such as pharmacist, RN and RRT.  In addition,  I personally evaluated patient and elicited key findings of as noted above an attending physician statement.  Rest per NP/medical resident whose note is outlined above and that I agree with    Dr. Kalman Shan, M.D., [facility].C.P Pulmonary and Critical Care Medicine Staff Physician [facility] [place] Pulmonary and Critical Care Pager: [phone], If no answer or between  15:00h - 7:00h: call [phone]  08/23/2012 11:29 AM

## 2012-08-23 NOTE — Progress Notes (Signed)
CARE MANAGEMENT NOTE 08/23/2012  Patient:  Carla [name], Carla [name]   Account Number:  [id number]  Date Initiated:  08/20/2012  Documentation initiated by:  [facility]  Subjective/Objective Assessment:   79 year old female admitted with shock.     Action/Plan:   Pt lived at home PTA.   Anticipated DC Date:  08/26/2012   Anticipated DC Plan:  HOME/SELF CARE         Choice offered to / List presented to:             Status of service:   Medicare Important Message given?  NA - LOS <3 / Initial given by admissions (If response is "NO", the following Medicare IM given date fields will be blank) Date Medicare IM given:   Date Additional Medicare IM given:    Discharge Disposition:    Per UR Regulation:  Reviewed for med. necessity/level of care/duration of stay  If discussed at Long Length of Stay Meetings, dates discussed:    Comments:  [id number]/Rhonda Earlene Plater, RN, BSN, CCM:  CHART REVIEWED AND UPDATED.  Next chart review due on [id number].  A.fib with rvr, iv amnio continuous drip, pna NO DISCHARGE NEEDS PRESENT AT THIS TIME. CASE MANAGEMENT [phone]

## 2012-08-23 NOTE — Progress Notes (Signed)
eLink Physician-Brief Progress Note Patient Name: Carla [name] DOB: [birth year] MRN: [id number]  Date of Service  08/23/2012   HPI/Events of Note   Pt back in A Fib with adequate rate control, no hemodynamic instability. C/o inability to void, painful urination.   eICU Interventions  In-out cath x 1 and then follow ability to void Pyridium at pt/family's request Will cancel move to tele, keep in SDU   Intervention Category Minor Interventions: Routine modifications to care plan (e.g. PRN medications for pain, fever)  BYRUM,ROBERT S. 08/23/2012, 9:19 PM

## 2012-08-23 NOTE — Progress Notes (Signed)
Bladder scan showed 441 cc. Pt is leaking urine and wet pads and gown changed.

## 2012-08-23 NOTE — Progress Notes (Signed)
eLink Physician-Brief Progress Note Patient Name: Carla [name] DOB: [birth year] MRN: [id number]  Date of Service  08/23/2012   HPI/Events of Note  Hypophosphatemia   eICU Interventions  Phos replaced   Intervention Category Intermediate Interventions: Electrolyte abnormality - evaluation and management  Patirica Longshore 08/23/2012, 5:06 AM

## 2012-08-23 NOTE — Progress Notes (Signed)
ANTIBIOTIC CONSULT NOTE - follow up  Pharmacy Consult for Levaquin Indication: UTI, CAP?  No Known Allergies  Patient Measurements: Height: 5\' 2"  (157.5 cm) Weight: 171 lb 11.8 oz (77.9 kg) IBW/kg (Calculated) : 50.1   Vital Signs: Temp: 97.5 F (36.4 C) (02/19 0800) Temp src: Oral (02/19 0800) BP: 132/60 mmHg (02/19 0600) Pulse Rate: 64 (02/19 0000) Intake/Output from previous day: 02/18 0701 - 02/19 0700 In: 1209.5 [P.O.:490; I.V.:83.5; IV Piggyback:636] Out: 900 [Urine:900] Intake/Output from this shift:    Labs:  Recent Labs  08/21/12 0510 08/22/12 0530 08/23/12 0418  WBC 17.9* 14.7* 14.4*  HGB 11.4* 10.6* 10.6*  PLT 253 197 246  CREATININE 2.34* 2.05* 1.68*   Estimated Creatinine Clearance: 22.4 ml/min (by C-G formula based on Cr of 1.68). No results found for this basename: VANCOTROUGH, Leodis Binet, VANCORANDOM, GENTTROUGH, GENTPEAK, GENTRANDOM, TOBRATROUGH, TOBRAPEAK, TOBRARND, AMIKACINPEAK, AMIKACINTROU, AMIKACIN,  in the last 72 hours   Microbiology: Recent Results (from the past 720 hour(s))  CULTURE, BLOOD (ROUTINE X 2)     Status: None   Collection Time    08/19/12  2:03 PM      Result Value Range Status   Specimen Description BLOOD LEFT HAND   Final   Special Requests BOTTLES DRAWN AEROBIC ONLY 1CC   Final   Culture  Setup Time 08/19/2012 18:03   Final   Culture     Final   Value:        BLOOD CULTURE RECEIVED NO GROWTH TO DATE CULTURE WILL BE HELD FOR 5 DAYS BEFORE ISSUING A FINAL NEGATIVE REPORT   Report Status PENDING   Incomplete  CULTURE, BLOOD (ROUTINE X 2)     Status: None   Collection Time    08/19/12  2:10 PM      Result Value Range Status   Specimen Description BLOOD RIGHT HAND   Final   Special Requests BOTTLES DRAWN AEROBIC ONLY 1CC   Final   Culture  Setup Time 08/19/2012 18:03   Final   Culture     Final   Value:        BLOOD CULTURE RECEIVED NO GROWTH TO DATE CULTURE WILL BE HELD FOR 5 DAYS BEFORE ISSUING A FINAL NEGATIVE REPORT    Report Status PENDING   Incomplete  MRSA PCR SCREENING     Status: None   Collection Time    08/19/12  3:10 PM      Result Value Range Status   MRSA by PCR NEGATIVE  NEGATIVE Final   Comment:            The GeneXpert MRSA Assay (FDA     approved for NASAL specimens     only), is one component of a     comprehensive MRSA colonization     surveillance program. It is not     intended to diagnose MRSA     infection nor to guide or     monitor treatment for     MRSA infections.  URINE CULTURE     Status: None   Collection Time    08/19/12  5:27 PM      Result Value Range Status   Specimen Description URINE, CLEAN CATCH   Final   Special Requests NONE   Final   Culture  Setup Time 08/20/2012 02:14   Final   Colony Count >=100,000 COLONIES/ML   Final   Culture ESCHERICHIA COLI   Final   Report Status 08/22/2012 FINAL   Final   Organism  ID, Bacteria ESCHERICHIA COLI   Final   Assessment:  79 yr old female known to pharmacy from Zosyn dosing for diverticulitis.  Chest Xray shows dense RLL pneumonia with possible infiltrate  Day #4 Levaquin for E.coli UTI and CAP.  Afebrile, WBCs improving.  SCr is improving. CrCl 22CG, 26N.  Plan:   Cont Levaquin 500mg  IV q48h.  F/u renal function.   MD, consider change to PO if appropriate.  Charolotte Eke, PharmD, pager [phone]. 08/23/2012,10:19 AM.

## 2012-08-23 NOTE — Progress Notes (Signed)
PHARMACIST-PHYSICIAN COMMUNICATION  Subject:  Potential drug interaction between Amiodarone and Levaquin.   Independently, fluoroquinolones and amiodarone are known to increase the QT interval, theoretically increasing the risk of cardiac arrhythmias including torsades de pointes.  The incidence of torsades with fluoroquinolones and amiodarone monotherapy is very low, < 0.001% and < 1%, respectively.  While the concomitant use of these agents may increase this risk, the extent is unknown but may be increased when other factors, as listed below, are present.     Heart failure / LV dysfunction  Coronary artery disease  Bradycardia (HR < 50)  Female sex  Receiving digoxin therapy  Severe hypomagnesemia (< 2 mEq/L)  Hypokalemia (< 4 mEq/L while on antiarrhythmic agent)  Baseline QT prolongation  Current QTc > 500 msec or prolonged > 60 msec from baseline  Recommend:  Monitor EKG closely as you are doing now, with attention to the QTc interval.  Polo Riley R.Ph. 08/23/2012 11:20 PM

## 2012-08-24 ENCOUNTER — Encounter (HOSPITAL_COMMUNITY): Payer: Self-pay | Admitting: Physician Assistant

## 2012-08-24 ENCOUNTER — Inpatient Hospital Stay (HOSPITAL_COMMUNITY): Payer: Medicare Other

## 2012-08-24 LAB — CBC
Hemoglobin: 11.9 g/dL — ABNORMAL LOW (ref 12.0–15.0)
MCH: 29.1 pg (ref 26.0–34.0)
RBC: 4.09 MIL/uL (ref 3.87–5.11)
WBC: 10.9 10*3/uL — ABNORMAL HIGH (ref 4.0–10.5)

## 2012-08-24 LAB — COMPREHENSIVE METABOLIC PANEL
AST: 15 U/L (ref 0–37)
Albumin: 1.8 g/dL — ABNORMAL LOW (ref 3.5–5.2)
CO2: 25 mEq/L (ref 19–32)
Calcium: 8 mg/dL — ABNORMAL LOW (ref 8.4–10.5)
Creatinine, Ser: 1.41 mg/dL — ABNORMAL HIGH (ref 0.50–1.10)
GFR calc non Af Amer: 32 mL/min — ABNORMAL LOW (ref >90)

## 2012-08-24 LAB — PRO B NATRIURETIC PEPTIDE: Pro B Natriuretic peptide (BNP): 9342 pg/mL — ABNORMAL HIGH (ref 0–450)

## 2012-08-24 LAB — MAGNESIUM: Magnesium: 1.4 mg/dL — ABNORMAL LOW (ref 1.5–2.5)

## 2012-08-24 MED ORDER — MAGNESIUM SULFATE 40 MG/ML IJ SOLN
2.0000 g | Freq: Once | INTRAMUSCULAR | Status: AC
  Administered 2012-08-24: 2 g via INTRAVENOUS
  Filled 2012-08-24: qty 50

## 2012-08-24 MED ORDER — FLUCONAZOLE 200 MG PO TABS
200.0000 mg | ORAL_TABLET | Freq: Once | ORAL | Status: AC
  Administered 2012-08-24: 200 mg via ORAL
  Filled 2012-08-24: qty 1

## 2012-08-24 MED ORDER — CEFTRIAXONE SODIUM 1 G IJ SOLR
1.0000 g | INTRAMUSCULAR | Status: DC
  Filled 2012-08-24: qty 10

## 2012-08-24 MED ORDER — FLUCONAZOLE 100 MG PO TABS
100.0000 mg | ORAL_TABLET | Freq: Every day | ORAL | Status: DC
  Administered 2012-08-25 – 2012-08-27 (×3): 100 mg via ORAL
  Filled 2012-08-24 (×3): qty 1

## 2012-08-24 MED ORDER — DEXTROSE 5 % IV SOLN
1.0000 g | INTRAVENOUS | Status: DC
  Administered 2012-08-24 – 2012-08-27 (×4): 1 g via INTRAVENOUS
  Filled 2012-08-24 (×4): qty 10

## 2012-08-24 NOTE — Progress Notes (Signed)
Name: Carla [name] MRN: [id number] DOB: 1921/07/24    LOS: 5  PULMONARY / CRITICAL CARE MEDICINE  HPI:  79 years old female with PMH relevant for dyslipidemia, hiatal hernia. Admitted 2/15 with intractable nausea and vomiting for 5 days. At admission hypotensive, clearly dehydrated, in acute renal failure. CT of the abdomen negative for acute intraabdominal process, bases of the lung with no pneumonia. Over the course of the day she developed mild respiratory distress. Subsequent chest X rays showed RLL infiltrate, possibly secondary to aspiration.  CC consult called 2/16 to assist with management.   Events/studies CT chest 2/17: Bibasilar airspace opacities and nodular opacities within the middle lobe and lingula; favor aspiration pneumonia.  Lines:  Left IJ placed 08/20/12>>>2/19  Cultures:  UC 2/15: ecoli - PAN SENSITIVE BCX2 2/15>>>neg   mrsa pcr 2/15: neg U strep 2/15: neg Stool evaluation for various toxins 08/20/2012: Negative C. difficile PCR 08/23/2012 >>neg  Antibiotics: - Zosyn 2/15>>> 08/23/12 - Vancomycin 2/16>>>08/23/12 - Levaquin 2/16>>> (8 days) -rocephin 2/20>>>3d  SUBJECTIVE/OVERNIGHT/INTERVAL HX Feeling better   Objective  Vital Signs: Temp:  [97.5 F (36.4 C)-98.6 F (37 C)] 97.6 F (36.4 C) (02/20 0400) Resp:  [17-29] 20 (02/20 0600) BP: (113-168)/(43-73) 120/43 mmHg (02/20 0600) SpO2:  [93 %-100 %] 99 % (02/20 0600) 2 Liters [place] Physical Examination: General: Sitting in the chair.  Neuro:  Oriented, nonfocal. RASS sedation score 0. Neurologically intact. Sitting in chair HEENT:  PERRL, pink conjunctivae, dry mucous membranes Neck:  Supple, no JVD   Cardiovascular:  RRR, no M/R/G Lungs: diminished bilateral bases. occ rhonchi  Abdomen:  Soft, nontender, nondistended, bowel sounds present Musculoskeletal:  Moves all extremities, no pedal edema. Baseline scoliosis Skin:  No rash   Recent Labs Lab 08/22/12 0530 08/23/12 0418 08/24/12 0350   NA 142 140 143  K 4.1 3.9 3.6  CL 110 110 112  CO2 23 24 25   BUN 44* 33* 23  CREATININE 2.05* 1.68* 1.41*  GLUCOSE 90 117* 107*    Recent Labs Lab 08/22/12 0530 08/23/12 0418 08/24/12 0350  HGB 10.6* 10.6* 11.9*  HCT 32.6* 33.5* 37.2  WBC 14.7* 14.4* 10.9*  PLT 197 246 242   No results found.    CXR: R>L airspace disease. No sig change when c/w film on 2/16  Principal Problem:   Gram negative sepsis Active Problems:   Iron deficiency anemia   High cholesterol   Hemorrhoids   Intractable nausea and vomiting   Hypotension, unspecified   Dehydration   Atrial fibrillation   Leukocytosis, unspecified   Acute kidney injury   Hypovolemic shock   Urinary tract infection   ASSESSMENT AND PLAN   1) RLL pneumonia, questionable aspiration. 2) Hypoxemic respiratory failure - 08/22/2012: Down to 2 L oxygen, 02 sat 96.  3) Septic shock: Now off pressors for 3 days 4) Elevated troponin with EKG ischemic changes likely due to demand ischemia. Troponin now normalized 5) Atrial fibrillation with RVR at admission  - Currently NSR, but in and out of AF 6) echo 08/21/2012 with pulmonary artery systolic pressure of 50 with mild RV dilation and grade 2 left ventricular diastolic dysfunction 7) Acute renal failure due to sepsis: scr improved  8) Hiatal hernia 9) Mild hematemesis after intractable vomit, felt to be d/t recent viral illness. Diff dx include: gastritis and  Mallory Judeth Porch but have to consider esophageal perforation in the differential. - 08/22/2012 no nausea or vomiting blood. Tolerating regular diet  10)  Diarrhea: Black watery  diarrhea noted today, has not changed from 2/16 when C-diff was negative X 2 11) mild anemia: no new evidence of bleeding  12) Ecoli UTI: treated   13) candidiasis peroneum 14) deconditioning    plan - Continue supplemental O2  - Continue antibiotics, complete 8d total rx abx (changed to rocephin 2/20 d/t QTC concern w/ diflucan and amio) -  Aggressive pulmonary toilet and mobilization -Continue tele monitoring -card consult: for af and concern about demand ischemia. if she if felt to be a candidate for anticoagulation she will need GI consult and EGD given recent UGI bleed.  -  low-dose Lasix 08/23/2012 with potassium replacement [this is an attending physician statement] - Probiotic - Regular diet restrict salt - Changed Protonix to low-dose Pepcid -Will follow CBC - PRBC for hgb </= 6.9gm%    - exceptions are   -  if ACS susepcted/confirmed then transfuse for hgb </= 8.0gm%,  or    -  If septic shock first 24h and scvo2 < 70% then transfuse for hgb </= 9.0gm%   - active bleeding with hemodynamic instability, then transfuse regardless of hemoglobin value   At at all times try to transfuse 1 unit prbc as possible with exception of active hemorrhage -start diflucan (5d) -cont PT/OT    Anders Simmonds nurse practitioner  08/24/2012 8:53 AM   STAFF NOTE: I, Dr Lavinia Sharps have personally reviewed patient's available data, including medical history, events of note, physical examination and test results as part of my evaluation. I have discussed with resident/NP and other care providers such as pharmacist, RN and RRT.  In addition,  I personally evaluated patient and elicited key findings of improving sepsis. Currently deconditioned. She might have recurrent atrial fibrillation and she has some yeast infection in the genitourinary area. We will call cardiology would also treat the yeast with Diflucan. She can go to telemetry floor.  Rest per NP/medical resident whose note is outlined above and that I agree with  Dr. Kalman Shan, M.D., [facility].C.P Pulmonary and Critical Care Medicine Staff Physician [place] [facility] [place] Pulmonary and Critical Care Pager: [phone], If no answer or between  15:00h - 7:00h: call [phone]  08/24/2012 11:29 AM

## 2012-08-24 NOTE — Progress Notes (Signed)
eLink Physician-Brief Progress Note Patient Name: Carla [name] DOB: [birth year] MRN: [id number]  Date of Service  08/24/2012   HPI/Events of Note   Hypomag  eICU Interventions  Magnesium replaced   Intervention Category Minor Interventions: Electrolytes abnormality - evaluation and management  DETERDING,ELIZABETH 08/24/2012, 4:47 AM

## 2012-08-24 NOTE — Consult Note (Addendum)
CARDIOLOGY CONSULT NOTE   Patient ID: Carla [name] MRN: [id number] DOB/AGE: October 12, 1921 79 y.o.  Admit date: 08/19/2012  Primary Physician   No primary provider on file. Primary Cardiologist   None Reason for Consultation   Atrial fib  Carla [name] is a 79 y.o. female with no history of CAD. She had a stress test for palpitations in 2012 that was negative. She was admitted by the hospitalists on 2/15 with N&V, diarrhea. There was hematemesis and possibly some blood in her stool. She was acutely ill. Blood cultures are negative so far, she has been treated for a UTI. She was noted to have paroxysmal atrial fibrillation with rapid ventricular response. She was started on IV amiodarone which was changed to oral amiodarone yesterday. Cardiology was asked to evaluate her.  Carla [name] has no history of cardiac issues. She never has palpitations. She has no history of presyncope or syncope. She has had 2 episodes consisting of a few seconds of dizziness in the last few years but no other issues or concerns. She never gets chest pain. She does not exercise regularly but is active around the house until this acute illness without difficulty. Since being in the hospital, she was told she was in atrial fibrillation but has had no awareness that her heart rate is rapid or irregular. She is feeling better now, and her rhythm is sinus with frequent PACs and some short bursts of paroxysmal atrial fibrillation but she is unaware of any of this.  Past Medical History  Diagnosis Date  . Iron deficiency anemia   . High cholesterol   . Osteoarthritis   . Female bladder prolapse   . Osteoporosis   . Cataracts, bilateral   . Gallstones   . Hemorrhoids   . Diverticulosis   . Hiatal hernia      Past Surgical History  Procedure Laterality Date  . Cholecystectomy    . Cataract extraction    . Esophagogastroduodenoscopy    . Colonoscopy      No Known Allergies  I have reviewed the  patient's current medications . acidophilus  1 capsule Oral Daily  . amiodarone  200 mg Oral BID  . antiseptic oral rinse  15 mL Mouth Rinse BID  . cefTRIAXone (ROCEPHIN)  IV  1 g Intravenous Q24H  . estradiol  1 Applicatorful Vaginal 3 times weekly  . famotidine  10 mg Oral QHS  . [START ON 08/25/2012] fluconazole  100 mg Oral Daily  . furosemide  20 mg Oral Daily  . levofloxacin (LEVAQUIN) IV  500 mg Intravenous Q48H  . potassium chloride  10 mEq Oral Daily  . sodium chloride  3 mL Intravenous Q12H     acetaminophen, acetaminophen, ondansetron (ZOFRAN) IV  Prior to Admission medications   Medication Sig Start Date End Date Taking? Authorizing Provider  aspirin EC 81 MG tablet Take 81 mg by mouth daily.   Yes Historical Provider, MD  calcium-vitamin D (OSCAL WITH D) 500-200 MG-UNIT per tablet Take 1 tablet by mouth daily.   Yes Historical Provider, MD  cholecalciferol (VITAMIN D) 1000 UNITS tablet Take 1,000 Units by mouth daily.   Yes Historical Provider, MD  conjugated estrogens (PREMARIN) vaginal cream Place 1 g vaginally 3 (three) times a week.   Yes Historical Provider, MD  ferrous sulfate 325 (65 FE) MG tablet Take 325 mg by mouth daily with breakfast.   Yes Historical Provider, MD  NIACIN CR PO Take 1 tablet by mouth daily.  Yes Historical Provider, MD  vitamin C (ASCORBIC ACID) 500 MG tablet Take 500 mg by mouth daily.   Yes Historical Provider, MD     History   Social History  . Marital Status: Widowed    Spouse Name: N/A    Number of Children: N/A  . Years of Education: N/A   Occupational History  . Retired.   Social History Main Topics  . Smoking status: Never Smoker   . Smokeless tobacco: Not on file  . Alcohol Use: No  . Drug Use: No  . Sexually Active: Not on file   Other Topics Concern  . Not on file   Social History Narrative  . Good family support     Family History  Problem Relation Age of Onset  . Breast cancer Paternal Aunt   . Breast cancer  Paternal Aunt   . Cancer      several family members on dad's side of family     ROS: Indigestion at times. She has some chronic dyspnea on exertion with occasional lower extremity edema. She has not been out of bed very much in the hospital. Full 14 point review of systems complete and found to be negative unless listed above.  Physical Exam: Blood pressure 146/80, pulse 64, temperature 97.5 F (36.4 C), temperature source Oral, resp. rate 16, height 5\' 2"  (1.575 m), weight 171 lb 11.8 oz (77.9 kg), SpO2 93.00%.  General: Well developed, well nourished, elderly female in no acute distress Head: Eyes PERRLA, No xanthomas.   Normocephalic and atraumatic, oropharynx without edema or exudate. Dentition: Good Lungs: Rales bilaterally with decreased breath sounds bases, gastric sounds heard over left lower lung fields. Heart:  Heart slightly irregular rate and rhythm with S1, S2; no significant murmur. pulses are 2+ all 4 extrem.   Neck: No carotid bruits. No lymphadenopathy.  JVD difficult to assess secondary to dressings and equipment, but appears elevated. Abdomen: Bowel sounds present, abdomen soft and non-tender without masses or hernias noted. Msk:  No spine or cva tenderness. No weakness, no joint deformities or effusions. Extremities: No clubbing or cyanosis. Trace edema.  Neuro: Alert and oriented X 3. No focal deficits noted. Psych:  Good affect, responds appropriately Skin: No rashes or lesions noted.  Labs:   Lab Results  Component Value Date   WBC 10.9* 08/24/2012   HGB 11.9* 08/24/2012   HCT 37.2 08/24/2012   MCV 91.0 08/24/2012   PLT 242 08/24/2012   No results found for this basename: INR,  in the last 72 hours   Recent Labs Lab 08/24/12 0350  NA 143  K 3.6  CL 112  CO2 25  BUN 23  CREATININE 1.41*  CALCIUM 8.0*  PROT 5.3*  BILITOT 0.2*  ALKPHOS 77  ALT 13  AST 15  GLUCOSE 107*   Magnesium  Date Value Range Status  08/24/2012 1.4* 1.5 - 2.5 mg/dL Final    Lab Results  Component Value Date   TROPONINI <0.30 08/21/2012   Pro B Natriuretic peptide (BNP)  Date/Time Value Range Status  08/24/2012  3:50 AM 9342.0* 0 - 450 pg/mL Final  08/23/2012  4:18 AM 7669.0* 0 - 450 pg/mL Final   Lipase  Date/Time Value Range Status  08/19/2012 10:34 AM 25  11 - 59 U/L Final   TSH  Date/Time Value Range Status  08/19/2012  6:51 PM 2.825  0.350 - 4.500 uIU/mL Final   Echo: 08/21/2012 Conclusions - Left ventricle: The cavity size was normal. There  was mild focal basal hypertrophy of the septum. The estimated ejection fraction was 55%. Wall motion was normal; there were no regional wall motion abnormalities. Features are consistent with a pseudonormal left ventricular filling pattern, with concomitant abnormal relaxation and increased filling pressure (grade 2 diastolic dysfunction). - Mitral valve: Calcified annulus. Mildly thickened leaflets - Right ventricle: The cavity size was mildly dilated. - Right atrium: The atrium was mildly dilated. - Atrial septum: No defect or patent foramen ovale was identified. - Pulmonary arteries: PA peak pressure: 52mm Hg (S). Impressions: - The right ventricular systolic pressure was increased consistent with moderate pulmonary hypertension.  Lexiscan Myoview:  08/31/2010 Raw Data Images: Soft tissue (breast, diaphragm) surround heart.  Stress Images: Normal perfusion with minimal apical thinning.  Rest Images: No significant change from the stress images.  Subtraction (SDS): No evidence of ischemia.  Transient Ischemic Dilatation: 0.96 (Normal <1.22)  Lung/Heart Ratio: 0.40 (Normal <0.45)  Quantitative Gated Spect Images  QGS EDV: 62 ml  QGS ESV: 15 ml  QGS EF: 76 %  Overall Impression  Exercise Capacity: Lexiscan with low level exercise  BP Response: Normal blood pressure response.  Clinical Symptoms: No chest pain  ECG Impression: No significant ST segment change suggestive of ischemia.  Overall  Impression: Normal stress nuclear study.  ECG: 24-Aug-2012 09:08:46 [facility] ICU ROUTINE RECORD Sinus bradycardia Nonspecific T wave abnormality Abnormal ECG 54mm/s 22mm/mV 100Hz  8.0.1 12SL 241 HD CID: 1 Referred by: Unconfirmed Vent. rate 58 BPM PR interval 162 ms QRS duration 84 ms QT/QTc 416/408 ms P-R-T axes 75 48 37   Radiology:      Ct Abdomen Pelvis Wo Contrast 08/19/2012  *RADIOLOGY REPORT*  Clinical Data: 79 year old female with abdominal pelvic pain and vomiting.  Elevated white count.  CT ABDOMEN AND PELVIS WITHOUT CONTRAST  Technique:  Multidetector CT imaging of the abdomen and pelvis was performed following the standard protocol without intravenous contrast.  Comparison: 02/2013 radiographs  Findings: A very large hiatal hernia is noted.  The liver, spleen and adrenal glands are unremarkable. The pancreas and kidneys are mildly atrophic with a left renal cyst identified. The patient is status post cholecystectomy.  Please note that parenchymal abnormalities may be missed as intravenous contrast was not administered.  Diffuse colonic diverticulosis noted with equivocal minimal stranding along the mid - distal descending colon - very mild diverticulitis is not excluded.  There is no evidence of pneumoperitoneum, focal abscess or bowel obstruction. The appendix is normal.  No free fluid, enlarged lymph nodes, biliary dilation or abdominal aortic aneurysm identified.  A pessary is noted.  No acute or suspicious bony abnormalities are noted.  Degenerative changes throughout the lumbar spine are present.  IMPRESSION: Very large hiatal hernia.  Possible very mild diverticulitis along the mid - distal descending colon - no evidence of pneumoperitoneum, focal abscess or bowel obstruction.  Mildly atrophic kidneys.   Original Report Authenticated By: Harmon Pier, M.D.    Ct Chest Wo Contrast 08/21/2012  *RADIOLOGY REPORT*  Clinical Data: Worsening infiltrate and effusion.  CT  CHEST WITHOUT CONTRAST  Technique:  Multidetector CT imaging of the chest was performed following the standard protocol without IV contrast.  Comparison: 08/20/2012 radiograph  Findings: Aortic atherosclerosis and tortuosity.  Heart size upper normal.  Aortic valve and coronary artery calcifications.  Left IJ central venous catheter tip projects at the cavoatrial junction.  Large hiatal hernia with organoaxial rotation.  Small bilateral pleural effusions.  Bibasilar airspace opacities and small effusions.  Clustered nodularity within the right middle lobe and lingula and to a lesser extent the right upper lobe.  No pneumothorax.  No pneumomediastinum.  Limited images through the upper abdomen show no acute finding. Colonic diverticulosis. Elevated right hemidiaphragm.  Multilevel degenerative changes.  No acute osseous finding. Exaggerated kyphosis.  IMPRESSION: Bibasilar airspace opacities and nodular opacities within the middle lobe and lingula; favor aspiration pneumonia.  Small effusions.  Large hiatal hernia with organoaxial rotation.   Original Report Authenticated By: Jearld Lesch, M.D.    Dg Chest Port 1 View 08/21/2012  *RADIOLOGY REPORT*  Clinical Data: Evaluate for pneumonia.Pleural effusions.  PORTABLE CHEST - 1 VIEW  Comparison: Chest x-ray 08/20/2012.  Findings: Lung volumes have slightly improved.  Extensive bibasilar opacities may reflect areas of atelectasis and/or consolidation with superimposed small to moderate bilateral pleural effusions (right greater than left).  Patchy interstitial and airspace opacities are also noted in the right mid and upper lung as well. No evidence of pulmonary edema.  Heart size is within normal limits. The patient is rotated to the right on today's exam, resulting in distortion of the mediastinal contours and reduced diagnostic sensitivity and specificity for mediastinal pathology. Atherosclerosis in the thoracic aorta. There is a left-sided internal jugular  central venous catheter with tip terminating in the superior cavoatrial junction.  IMPRESSION: 1.  Allowing for slight differences in patient positioning, and slight improvement in overall lung volumes, the radiographic appearance of chest is essentially unchanged, as above.   Original Report Authenticated By: Trudie Reed, M.D.    Dg Chest Port 1 View 08/20/2012  *RADIOLOGY REPORT*  Clinical Data: Central line placement  PORTABLE CHEST - 1 VIEW  Comparison: 08/20/2012  Findings: Degraded by rotation.  Interval left IJ catheter placed with tip projecting over the mid SVC. No pneumothorax.  Increased bilateral pleural effusions.  Patchy airspace opacities bilaterally, right greater than left.  Cardiomediastinal contours are obscured.  There is suggestion of a hiatal hernia. Atherosclerotic disease.  Osteopenia.  IMPRESSION: Interval left IJ catheter placed with tip projecting over the mid SVC.  No pneumothorax.  Increased bilateral pleural effusions and airspace opacities; infection versus edema.   Original Report Authenticated By: Jearld Lesch, M.D.    Dg Chest Port 1 View 08/20/2012  *RADIOLOGY REPORT*  Clinical Data: Hypoxia  PORTABLE CHEST - 1 VIEW  Comparison: 08/20/2012  Findings: Large hiatal hernia is stable.  Airspace opacities at the right base have increased.  Minimal atelectasis at the left base. No pneumothorax.  Degenerative changes of the right glenohumeral joint are noted. Increased opacity over the right first rib may simply represent confluence of shadows.  Increasing airspace disease is not excluded.  IMPRESSION: Increasing airspace disease at the right base.  Possible increasing airspace disease in the right upper lobe.   Original Report Authenticated By: Jolaine Click, M.D.    Dg Chest Port 1 View 08/20/2012  *RADIOLOGY REPORT*  Clinical Data: Decreased O2 saturation.  PORTABLE CHEST - 1 VIEW  Comparison: Chest radiograph performed 08/19/2012  Findings: There is mild elevation of the  right hemidiaphragm, with associated atelectasis.  Lung expansion is mildly decreased.  Vague mild left midlung zone opacity could reflect a mild infectious process.  Apparent right upper lung zone opacity is thought to reflect the overlying costochondral junction.  No pleural effusion or pneumothorax is seen.  The cardiomediastinal silhouette is borderline normal in size; a large hiatal hernia is again noted.  No acute osseous abnormalities are seen.  Degenerative change  is noted at the right glenohumeral joint, with cortical irregularity and sclerotic change, and underlying osteophyte formation.  IMPRESSION:  1.  Mild elevation of the right hemidiaphragm, with associated atelectasis; vague mild left midlung zone opacity could reflect a mild infectious process. 2.  Large hiatal hernia again seen.   Original Report Authenticated By: Tonia Ghent, M.D.    Dg Chest Port 1 View 08/19/2012  *RADIOLOGY REPORT*  Clinical Data:  Pain and vomiting.  PORTABLE CHEST - 1 VIEW  Comparison: None  Findings: Cardiomegaly is present. There is no evidence of focal airspace disease, pulmonary edema, suspicious pulmonary nodule/mass, pleural effusion, or pneumothorax. No acute bony abnormalities are identified. Degenerative changes within the shoulders identified.  IMPRESSION: Cardiomegaly without evidence of acute cardiopulmonary disease.   Original Report Authenticated By: Harmon Pier, M.D.    Dg Abd Portable 2v 08/19/2012  *RADIOLOGY REPORT*  Clinical Data: Abdominal pain and vomiting.  PORTABLE ABDOMEN - 2 VIEW  Comparison: None  Findings: The bowel gas pattern is unremarkable. A small to moderate amount of stool in the colon is noted.  No dilated bowel loops are identified. There is no evidence of pneumoperitoneum. Degenerative changes in the hips and lumbar spine are present.  IMPRESSION: No evidence of acute abnormality - unremarkable bowel gas pattern.   Original Report Authenticated By: Harmon Pier, M.D.     ASSESSMENT  AND PLAN:   The patient was seen today by Dr [place], the patient evaluated and the data reviewed.  Atrial fibrillation - it is paroxysmal and she is asymptomatic. The duration and frequency of episodes prior to admission is unknown. Her heart rate was elevated during her acute illness but is under better control now. She has had some heart rates in the 50s, so she may not tolerate a beta blocker at this time. Her TSH and LFTs will need to be followed as an outpatient on amiodarone.   Volume overload - her weight is up 21 pounds since admission. However, on admission, she was dehydrated and hypotensive. She needed fluid resuscitation. Her BNP is trending up but her most recent chest x-ray does not show edema. We'll repeat a chest x-ray, consider IV Lasix for 24-48 hours.  Anticoagulation - she is not currently on any anticoagulation at all except SCDs. M.D. advise if she is a candidate for Coumadin.   Principal Problem:   Gram negative sepsis Active Problems:   Iron deficiency anemia   High cholesterol   Hemorrhoids   Intractable nausea and vomiting   Hypotension, unspecified   Dehydration    Leukocytosis, unspecified   Acute kidney injury   Hypovolemic shock   Urinary tract infection   Signed: Theodore [name] 08/24/2012, 1:16 PM Co-Sign MD Patient seen and examined and history reviewed. Agree with above findings and plan. Very pleasant WF with problems described above. Admitted with UTI, fever, ARF, and dehydration. Was in atrial fibrillation with RVR. Since has converted to NSR. Still has episodes of Afib vs MAT. Completely asymptomatic. On exam lungs reveal diminished BS bilaterally, worse in bases. CV without murmur or gallop. I don't appreciate any JVD. Echo unremarkable. Prior Myoview 2012 was normal. Patient on oral amiodarone. Overall her rhythm is improving and she is maintaining NSR more. I would continue current dose for 4 weeks then reduce to 200 mg daily. She is a poor candidate  for anticoagulation given advanced age and recent UGI bleed. Her [place] score is not particularly high with only age and sex against her. Her bleeding risk  is high now. I think a short term rhythm control strategy is reasonable. I would consider an event monitor in 4-6 weeks. If she has recurrent afib at that time then we could consider anticoagulation with low dose Eliquis if she has no further bleeding. Her BNP is high and weight has increased by 20 lbs this admit but CXR isn't much different. Would continue with oral lasix as you age doing. Agree that earlier troponin lead was just demand related.  Carla [name] [facility] 08/24/2012 5:20 PM

## 2012-08-24 NOTE — Evaluation (Signed)
Physical Therapy Evaluation Patient Details Name: Carla [name] MRN: [id number] DOB: 1921-12-28 Today's Date: 08/24/2012 Time: 7829-5621 PT Time Calculation (min): 16 min  PT Assessment / Plan / Recommendation Clinical Impression  Pt presents with gram negative sepsis, UTI and aspiration PNA following intractable N/V.  Tolerated OOB and ambulation in hallway well at min/mod assist for all mobility.  Pt noted to be somewhat unsteady, esp when turning with RW and unsafe when sitting back down on bed.  Ambulated on RA with SaO2 at 86% upon returning to room.  Reapplied 2L O2 with cues for pursed lip breathing and pt easliy able to increase sats to 92%.   Pt will benefit from  skilled PT in acute venue to address deficits.  PT recommends ST SNF vs HHPT at D/C pending pt progress.  Pt states she wishes to return home, however spoke with pt/son and recommend 24/7 assist/supervision at least initially upon D/C.     PT Assessment  Patient needs continued PT services    Follow Up Recommendations  Home health PT;SNF;Supervision/Assistance - 24 hour    Does the patient have the potential to tolerate intense rehabilitation      Barriers to Discharge Decreased caregiver support      Equipment Recommendations  None recommended by PT    Recommendations for Other Services OT consult   Frequency Min 3X/week    Precautions / Restrictions Precautions Precautions: Fall Restrictions Weight Bearing Restrictions: No   Pertinent Vitals/Pain No pain      Mobility  Bed Mobility Bed Mobility: Supine to Sit;Sit to Supine Supine to Sit: 4: Min assist Sit to Supine: 4: Min guard Details for Bed Mobility Assistance: Some assist for trunk to get into sitting position with mod cues for hand placement and technique.  Transfers Transfers: Sit to Stand;Stand to Sit Sit to Stand: 4: Min assist;From elevated surface;With upper extremity assist;From bed;3: Mod assist Stand to Sit: 3: Mod assist;With  upper extremity assist;To bed Details for Transfer Assistance: Assist to rise, steady and ensure controlled descent with max cues for hand placement and controlled descent due to pt "flopping" back into bed unsafely.   Ambulation/Gait Ambulation/Gait Assistance: 4: Min assist Ambulation Distance (Feet): 100 Feet Assistive device: Rolling walker Ambulation/Gait Assistance Details: Assist to steady throughout with cues for sequencing/technique with RW (esp when turning) and to maintain upright posture throughout.  Gait Pattern: Step-through pattern;Decreased stride length;Trunk flexed Gait velocity: decreased Stairs: No Wheelchair Mobility Wheelchair Mobility: No    Exercises     PT Diagnosis: Difficulty walking;Generalized weakness;Acute pain  PT Problem List: Decreased strength;Decreased activity tolerance;Decreased balance;Decreased mobility;Decreased coordination;Decreased knowledge of use of DME;Decreased safety awareness;Decreased knowledge of precautions;Pain PT Treatment Interventions: DME instruction;Gait training;Functional mobility training;Stair training;Therapeutic activities;Therapeutic exercise;Balance training;Patient/family education   PT Goals Acute Rehab PT Goals PT Goal Formulation: With patient/family Time For Goal Achievement: 09/07/12 Potential to Achieve Goals: Good Pt will go Supine/Side to Sit: with modified independence PT Goal: Supine/Side to Sit - Progress: Goal set today Pt will go Sit to Supine/Side: with modified independence PT Goal: Sit to Supine/Side - Progress: Goal set today Pt will go Sit to Stand: with modified independence PT Goal: Sit to Stand - Progress: Goal set today Pt will go Stand to Sit: with modified independence PT Goal: Stand to Sit - Progress: Goal set today Pt will Ambulate: >150 feet;with supervision;with least restrictive assistive device PT Goal: Ambulate - Progress: Goal set today Pt will Go Up / Down Stairs: 1-2 stairs;with min  assist;with least restrictive assistive device PT Goal: Up/Down Stairs - Progress: Goal set today  Visit Information  Last PT Received On: 08/24/12 Assistance Needed: +1    Subjective Data  Subjective: I was in the chair earlier.  Patient Stated Goal: to return home.    Prior Functioning  Home Living Lives With: Alone Available Help at Discharge: Family;Available PRN/intermittently Type of Home: House Home Access: Stairs to enter [employer] of Steps: 1 Home Layout: One level Bathroom Shower/Tub: Engineer, manufacturing systems: Standard Home Adaptive Equipment: Environmental consultant - standard;Straight cane;Grab bars in shower Prior Function Level of Independence: Independent Able to Take Stairs?: Yes Driving: Yes Vocation: Retired Musician: No difficulties    Copywriter, advertising Arousal/Alertness: Awake/alert Orientation Level: Appears intact for tasks assessed Behavior During Session: [facility] for tasks performed    Extremity/Trunk Assessment Right Lower Extremity Assessment RLE ROM/Strength/Tone: WFL for tasks assessed RLE Sensation: WFL - Light Touch Left Lower Extremity Assessment LLE ROM/Strength/Tone: WFL for tasks assessed LLE Sensation: WFL - Light Touch Trunk Assessment Trunk Assessment: Kyphotic   Balance    End of Session PT - End of Session Equipment Utilized During Treatment: Gait belt Activity Tolerance: Patient limited by fatigue Patient left: in bed;with call bell/phone within reach;with family/visitor present Nurse Communication: Mobility status  GP     Vista Deck 08/24/2012, 4:48 PM

## 2012-08-25 LAB — CULTURE, BLOOD (ROUTINE X 2)
Culture: NO GROWTH
Culture: NO GROWTH

## 2012-08-25 LAB — CBC
HCT: 36.2 % (ref 36.0–46.0)
Hemoglobin: 11.7 g/dL — ABNORMAL LOW (ref 12.0–15.0)
MCHC: 32.3 g/dL (ref 30.0–36.0)
MCV: 90.7 fL (ref 78.0–100.0)
RDW: 15.3 % (ref 11.5–15.5)

## 2012-08-25 LAB — COMPREHENSIVE METABOLIC PANEL
ALT: 14 U/L (ref 0–35)
Albumin: 1.6 g/dL — ABNORMAL LOW (ref 3.5–5.2)
Alkaline Phosphatase: 66 U/L (ref 39–117)
Potassium: 3.5 mEq/L (ref 3.5–5.1)
Sodium: 145 mEq/L (ref 135–145)
Total Protein: 5.5 g/dL — ABNORMAL LOW (ref 6.0–8.3)

## 2012-08-25 NOTE — Progress Notes (Signed)
SUBJECTIVE: Feeling better this am. Breathing is at baseline per pt. No pain.   BP 156/92  Pulse 86  Temp(Src) 98.2 F (36.8 C) (Oral)  Resp 18  Ht 5\' 2"  (1.575 m)  Wt 171 lb 11.8 oz (77.9 kg)  BMI 31.4 kg/m2  SpO2 95%  Intake/Output Summary (Last 24 hours) at 08/25/12 1610 Last data filed at 08/24/12 1934  Gross per 24 hour  Intake    710 ml  Output    340 ml  Net    370 ml    PHYSICAL EXAM General: Well developed, well nourished, in no acute distress. Alert and oriented x 3.  Psych:  Good affect, responds appropriately Neck: No JVD. No masses noted.  Lungs: Decreased breath sounds at bases. No wheezes or rhonci noted.  Heart: RRR  Abdomen: Bowel sounds are present. Soft, non-tender.  Extremities: No lower extremity edema.   LABS: Basic Metabolic Panel:  Recent Labs  96/04/54 0350 08/25/12 0450  NA 143 145  K 3.6 3.5  CL 112 107  CO2 25 29  GLUCOSE 107* 116*  BUN 23 17  CREATININE 1.41* 1.24*  CALCIUM 8.0* 7.8*  MG 1.4* 1.5  PHOS 3.3 2.7   CBC:  Recent Labs  08/24/12 0350 08/25/12 0450  WBC 10.9* 12.0*  HGB 11.9* 11.7*  HCT 37.2 36.2  MCV 91.0 90.7  PLT 242 276    Current Meds: . acidophilus  1 capsule Oral Daily  . amiodarone  200 mg Oral BID  . antiseptic oral rinse  15 mL Mouth Rinse BID  . cefTRIAXone (ROCEPHIN)  IV  1 g Intravenous Q24H  . estradiol  1 Applicatorful Vaginal 3 times weekly  . famotidine  10 mg Oral QHS  . fluconazole  100 mg Oral Daily  . furosemide  20 mg Oral Daily  . levofloxacin (LEVAQUIN) IV  500 mg Intravenous Q48H  . potassium chloride  10 mEq Oral Daily  . sodium chloride  3 mL Intravenous Q12H   Echo 08/21/12:  Left ventricle: The cavity size was normal. There was mild focal basal hypertrophy of the septum. The estimated ejection fraction was 55%. Wall motion was normal; there were no regional wall motion abnormalities. Features are consistent with a pseudonormal left ventricular filling pattern, with  concomitant abnormal relaxation and increased filling pressure (grade 2 diastolic dysfunction). - Mitral valve: Calcified annulus. Mildly thickened leaflets . - Right ventricle: The cavity size was mildly dilated. - Right atrium: The atrium was mildly dilated. - Atrial septum: No defect or patent foramen ovale was identified. - Pulmonary arteries: PA peak pressure: 52mm Hg (S). Impressions:  - The right ventricular systolic pressure was increased consistent with moderate pulmonary hypertension.    ASSESSMENT AND PLAN: 79 y.o. female with no history of CAD. She had a stress test for palpitations in 2012 that was negative. She was admitted on 2/15 with N&V, diarrhea. There was hematemesis and possibly some blood in her stool. She was acutely ill. She has been treated for a UTI. She was noted to have paroxysmal atrial fibrillation with rapid ventricular response. She was started on IV amiodarone which was changed to oral amiodarone on 08/23/12. Cardiology was asked to evaluate her. She was seen by Dr. Swaziland 08/24/12.   1. Atrial fibrillation with RVR: Sinus this am. Telemetry shows episodes of narrow complex tachycardia overnight. She may continue to have episodes of SVT/atrial fib during her acute illness. Continue current dose of amiodarone for 4 weeks then  reduce to 200 mg daily. She is a poor candidate for long term anticoagulation given advanced age and recent UGI bleed. Her bleeding risk is high now. Would pursue short term rhythm control strategy. We can plan an event monitor in 4-6 weeks which can be arranged as an outpatient with Dr. Peter Swaziland. If she has recurrent afib at that time then we could consider anticoagulation with low dose Eliquis if she has no further bleeding.   2. Acute Diastolic CHF: In setting of acute illness with aggressive volume resuscitation. Her BNP is high and weight has increased by 21 lbs this admit. Does not appear fluid loaded on exam and CXR is not c/w large  fluid overload although there are small bilateral pleural effusions.  Would continue with oral lasix for now.  Agree that earlier troponin leak was just demand related.    Carla [name],Carla [name]  2/21/20146:10 AM

## 2012-08-25 NOTE — Progress Notes (Signed)
Physical Therapy Treatment Patient Details Name: Carla [name] MRN: [id number] DOB: [birth year] Today's Date: 08/25/2012 Time: 4010-2725 PT Time Calculation (min): 40 min  PT Assessment / Plan / Recommendation Comments on Treatment Session  Assisted pt OOB to [facility] as she reports freq, no prior notice, BM's. Assisted with hygiene then amb pt in hallway.  Pt admits she is not use to using a RW however stated she would need one for home. Pt on 2 lts O2 at rest 94%.  Amb pt in hallway, pt report mild SOB sats decreased to 78% with HR in 140's.  RN aware.    Follow Up Recommendations  Home health PT;SNF;Supervision/Assistance - 24 hour     Does the patient have the potential to tolerate intense rehabilitation     Barriers to Discharge        Equipment Recommendations  None recommended by PT    Recommendations for Other Services    Frequency Min 3X/week   Plan      Precautions / Restrictions Precautions Precautions: Fall Restrictions Weight Bearing Restrictions: No   Pertinent Vitals/Pain C/o weakness    Mobility  Bed Mobility Bed Mobility: Supine to Sit Supine to Sit: 4: Min assist Details for Bed Mobility Assistance: Some assist for trunk to get into sitting position with mod cues for hand placement and technique and increased time Transfers Transfers: Sit to Stand;Stand to Sit Sit to Stand: 4: Min assist;From elevated surface;With upper extremity assist;From bed;3: Mod assist Stand to Sit: 3: Mod assist;With upper extremity assist;To bed Details for Transfer Assistance: 50% VC's on proper hand placement and safety with turns. Ambulation/Gait Ambulation/Gait Assistance: 4: Min assist Ambulation Distance (Feet): 108 Feet Assistive device: Rolling walker Ambulation/Gait Assistance Details: Pt unable to stand upright 2nd back Hx. 25% VC's on safety with turn using RW throughout and proper walker to self distance. Pt admits she is not use to using one. Limited activity and  fatigues quicklly. Gait Pattern: Step-through pattern;Decreased stride length;Trunk flexed Gait velocity: decreased     PT Goals                                                        progressing    Visit Information  Last PT Received On: 08/25/12 Assistance Needed: +1    Subjective Data  Subjective: I have been going (BM) alot Patient Stated Goal: home   Cognition       Balance   fair  End of Session PT - End of Session Equipment Utilized During Treatment: Gait belt Activity Tolerance: Patient limited by fatigue Patient left: with call bell/phone within reach;with family/visitor present;in chair   Felecia Shelling  PTA [facility]  Acute  Rehab Pager      [phone]

## 2012-08-25 NOTE — Progress Notes (Signed)
[facility] PROGRESS NOTE  Carla [name] JXB:[id number] DOB: 12-18-1921 DOA: 08/19/2012 PCP: No primary provider on file.  Assessment/Plan: 1) RLL pneumonia, questionable aspiration.- rocephin- til 2/22 2) Hypoxemic respiratory failure  - 08/22/2012: Down to 2 L oxygen, 02 sat 96- may need to go home with O2.  3) Septic shock: off pressors  4) Elevated troponin with EKG ischemic changes likely due to demand ischemia. Troponin now normalized  5) Atrial fibrillation with RVR at admission - Currently NSR, but in and out of AF, cardiology following- Continue current dose of amiodarone for 4 weeks then reduce to 200 mg daily. She is a poor candidate for long term anticoagulation given advanced age and recent UGI bleed. Her bleeding risk is high now. Would pursue short term rhythm control strategy. We can plan an event monitor in 4-6 weeks which can be arranged as an outpatient with Dr. Peter Swaziland. If she has recurrent afib at that time then we could consider anticoagulation with low dose Eliquis if she has no further bleeding.  6) echo 08/21/2012 with pulmonary artery systolic pressure of 50 with mild RV dilation and grade 2 left ventricular diastolic dysfunction  7) Acute renal failure due to sepsis: scr improved  8) Hiatal hernia  9) Mild hematemesis after intractable vomit, felt to be d/t recent viral illness. Diff dx include: gastritis and Mallory Judeth Porch but have to consider esophageal perforation in the differential.  - 08/22/2012 no nausea or vomiting blood. Tolerating regular diet  10) Diarrhea: Black watery diarrhea noted today, has not changed from 2/16 when C-diff was negative X 2  11) mild anemia: no new evidence of bleeding  12) Ecoli UTI: treated  13) candidiasis peroneum- diflucan day #6  14) deconditioning - PT/OT   Code Status: full Family Communication: called daughter Disposition Plan: home 1-2 days   Consultants:  transfer from  ICU  cardiology  Procedures:  Left IJ 2/16-2/19    Antibiotics:  rocephin  HPI/Subjective: Loose stools today No new c/o No fever, no chills  Objective: Filed Vitals:   08/24/12 1239 08/24/12 1437 08/24/12 2200 08/25/12 0600  BP: 146/80 177/77 156/92 166/90  Pulse:  88 86 74  Temp: 97.5 F (36.4 C) 97.6 F (36.4 C) 98.2 F (36.8 C) 97.7 F (36.5 C)  TempSrc: Oral Oral Oral Oral  Resp: 16 16 18 18   Height:      Weight:    72.576 kg (160 lb)  SpO2: 93% 97% 95% 92%    Intake/Output Summary (Last 24 hours) at 08/25/12 1058 Last data filed at 08/25/12 0900  Gross per 24 hour  Intake    650 ml  Output   2941 ml  Net  -2291 ml   Filed Weights   08/21/12 0500 08/23/12 0400 08/25/12 0600  Weight: 75.6 kg (166 lb 10.7 oz) 77.9 kg (171 lb 11.8 oz) 72.576 kg (160 lb)    Exam:   General:  A+Ox3, NAd  Cardiovascular: rrr  Respiratory: clear anterior  Abdomen: +BS, soft, NT  Data Reviewed: Basic Metabolic Panel:  Recent Labs Lab 08/21/12 0110 08/21/12 0510 08/22/12 0530 08/23/12 0418 08/24/12 0350 08/25/12 0450  NA 135 137 142 140 143 145  K 3.1* 3.0* 4.1 3.9 3.6 3.5  CL 101 106 110 110 112 107  CO2 22 23 23 24 25 29   GLUCOSE 265* 136* 90 117* 107* 116*  BUN 60* 54* 44* 33* 23 17  CREATININE 2.54* 2.34* 2.05* 1.68* 1.41* 1.24*  CALCIUM 7.0* 7.3* 7.7*  7.7* 8.0* 7.8*  MG 1.8  --  1.9 1.6 1.4* 1.5  PHOS  --   --  2.3 2.0* 3.3 2.7   Liver Function Tests:  Recent Labs Lab 08/21/12 0510 08/22/12 0530 08/23/12 0418 08/24/12 0350 08/25/12 0450  AST 26 21 17 15 17   ALT 17 14 14 13 14   ALKPHOS 68 66 76 77 66  BILITOT 0.2* 0.2* 0.2* 0.2* 0.1*  PROT 5.2* 5.2* 4.8* 5.3* 5.5*  ALBUMIN 1.8* 1.6* 1.7* 1.8* 1.6*    Recent Labs Lab 08/19/12 1034  LIPASE 25   No results found for this basename: AMMONIA,  in the last 168 hours CBC:  Recent Labs Lab 08/19/12 1034  08/21/12 0510 08/22/12 0530 08/23/12 0418 08/24/12 0350 08/25/12 0450  WBC  27.6*  < > 17.9* 14.7* 14.4* 10.9* 12.0*  NEUTROABS 22.6*  --   --   --   --   --   --   HGB 16.7*  < > 11.4* 10.6* 10.6* 11.9* 11.7*  HCT 49.5*  < > 35.9* 32.6* 33.5* 37.2 36.2  MCV 89.4  < > 91.8 90.8 91.8 91.0 90.7  PLT 332  < > 253 197 246 242 276  < > = values in this interval not displayed. Cardiac Enzymes:  Recent Labs Lab 08/19/12 1316 08/19/12 1951 08/21/12 0110 08/21/12 0510 08/21/12 1205  TROPONINI 0.47* 0.34* <0.30 <0.30 <0.30   BNP (last 3 results)  Recent Labs  08/23/12 0418 08/24/12 0350  PROBNP 7669.0* 9342.0*   CBG: No results found for this basename: GLUCAP,  in the last 168 hours  Recent Results (from the past 240 hour(s))  CULTURE, BLOOD (ROUTINE X 2)     Status: None   Collection Time    08/19/12  2:03 PM      Result Value Range Status   Specimen Description BLOOD LEFT HAND   Final   Special Requests BOTTLES DRAWN AEROBIC ONLY 1CC   Final   Culture  Setup Time 08/19/2012 18:03   Final   Culture NO GROWTH 5 DAYS   Final   Report Status 08/25/2012 FINAL   Final  CULTURE, BLOOD (ROUTINE X 2)     Status: None   Collection Time    08/19/12  2:10 PM      Result Value Range Status   Specimen Description BLOOD RIGHT HAND   Final   Special Requests BOTTLES DRAWN AEROBIC ONLY 1CC   Final   Culture  Setup Time 08/19/2012 18:03   Final   Culture NO GROWTH 5 DAYS   Final   Report Status 08/25/2012 FINAL   Final  MRSA PCR SCREENING     Status: None   Collection Time    08/19/12  3:10 PM      Result Value Range Status   MRSA by PCR NEGATIVE  NEGATIVE Final   Comment:            The GeneXpert MRSA Assay (FDA     approved for NASAL specimens     only), is one component of a     comprehensive MRSA colonization     surveillance program. It is not     intended to diagnose MRSA     infection nor to guide or     monitor treatment for     MRSA infections.  URINE CULTURE     Status: None   Collection Time    08/19/12  5:27 PM      Result Value Range  Status   Specimen Description URINE, CLEAN CATCH   Final   Special Requests NONE   Final   Culture  Setup Time 08/20/2012 02:14   Final   Colony Count >=100,000 COLONIES/ML   Final   Culture ESCHERICHIA COLI   Final   Report Status 08/22/2012 FINAL   Final   Organism ID, Bacteria ESCHERICHIA COLI   Final  CLOSTRIDIUM DIFFICILE BY PCR     Status: None   Collection Time    08/23/12 10:45 AM      Result Value Range Status   C difficile by pcr NEGATIVE  NEGATIVE Final     Studies: Dg Chest 2 View  08/24/2012  *RADIOLOGY REPORT*  Clinical Data: Shortness of breath and weakness.  Pleural effusion evaluation.  CHEST - 2 VIEW  Comparison: 08/21/2012.  CT 08/21/2012.  Findings: Cardiac silhouette is upper normal size.  Cardiac silhouette has partially obscured on the PA image by a moderately large hiatal hernia.  There is minimal left basilar atelectasis associated with a small left pleural effusion.  There is increased aeration of the left lung base compared to previous study.  There is continued infiltrative density and atelectasis and opacity in the lower portion of the right lung with associated right pleural effusion.  The right pleural effusion appears to have increased slightly since previous study.  There is an osteopenic appearance of the bones.  Changes of degenerative disc disease and degenerative spondylosis are present. Ectasia and nonaneurysmal calcification of the thoracic aorta are seen.  IMPRESSION: Improved aeration in left base.  Minimal left basilar atelectasis and infiltrate with small left pleural effusion.  There is continued infiltrative density and atelectasis and opacity in the lower portion of the right lung with associated right pleural effusion.  The right pleural effusion appears to have increased slightly since previous study.  A moderately large hiatal hernia.  Other chronic findings appear stable and are detailed above.   Original Report Authenticated By: Onalee Hua Call      Scheduled Meds: . acidophilus  1 capsule Oral Daily  . amiodarone  200 mg Oral BID  . antiseptic oral rinse  15 mL Mouth Rinse BID  . cefTRIAXone (ROCEPHIN)  IV  1 g Intravenous Q24H  . estradiol  1 Applicatorful Vaginal 3 times weekly  . famotidine  10 mg Oral QHS  . fluconazole  100 mg Oral Daily  . furosemide  20 mg Oral Daily  . levofloxacin (LEVAQUIN) IV  500 mg Intravenous Q48H  . potassium chloride  10 mEq Oral Daily  . sodium chloride  3 mL Intravenous Q12H   Continuous Infusions:   Principal Problem:   Gram negative sepsis Active Problems:   Iron deficiency anemia   High cholesterol   Hemorrhoids   Intractable nausea and vomiting   Hypotension, unspecified   Dehydration   Atrial fibrillation   Leukocytosis, unspecified   Acute kidney injury   Hypovolemic shock   Urinary tract infection    Time spent: 35    [facility], Birdena Kingma  [facility] Pager [phone]. If 8PM-8AM, please contact night-coverage at [email or web address], password [facility] 08/25/2012, 10:58 AM  LOS: 6 days

## 2012-08-25 NOTE — Progress Notes (Signed)
Patient sitting in chair, complains of a "little" discomfort in the right hip and weak but otherwise ok.  Patient breathing without issue on 2 L oxygen via nasal cannula, radial pulse = 102.

## 2012-08-26 ENCOUNTER — Inpatient Hospital Stay (HOSPITAL_COMMUNITY): Payer: Medicare Other

## 2012-08-26 LAB — COMPREHENSIVE METABOLIC PANEL
ALT: 14 U/L (ref 0–35)
CO2: 31 mEq/L (ref 19–32)
Calcium: 8.1 mg/dL — ABNORMAL LOW (ref 8.4–10.5)
Chloride: 100 mEq/L (ref 96–112)
Creatinine, Ser: 1.04 mg/dL (ref 0.50–1.10)
GFR calc Af Amer: 54 mL/min — ABNORMAL LOW (ref >90)
GFR calc non Af Amer: 47 mL/min — ABNORMAL LOW (ref >90)
Glucose, Bld: 112 mg/dL — ABNORMAL HIGH (ref 70–99)
Sodium: 139 mEq/L (ref 135–145)
Total Bilirubin: 0.2 mg/dL — ABNORMAL LOW (ref 0.3–1.2)

## 2012-08-26 LAB — CBC
Hemoglobin: 13.4 g/dL (ref 12.0–15.0)
MCH: 29.4 pg (ref 26.0–34.0)
MCHC: 33.1 g/dL (ref 30.0–36.0)
Platelets: 301 10*3/uL (ref 150–400)

## 2012-08-26 LAB — MAGNESIUM: Magnesium: 1.3 mg/dL — ABNORMAL LOW (ref 1.5–2.5)

## 2012-08-26 LAB — PHOSPHORUS: Phosphorus: 2.4 mg/dL (ref 2.3–4.6)

## 2012-08-26 MED ORDER — MAGNESIUM SULFATE 40 MG/ML IJ SOLN
2.0000 g | Freq: Once | INTRAMUSCULAR | Status: AC
  Administered 2012-08-26: 2 g via INTRAVENOUS
  Filled 2012-08-26: qty 50

## 2012-08-26 MED ORDER — POTASSIUM CHLORIDE CRYS ER 20 MEQ PO TBCR
40.0000 meq | EXTENDED_RELEASE_TABLET | Freq: Once | ORAL | Status: AC
  Administered 2012-08-26: 40 meq via ORAL
  Filled 2012-08-26: qty 2

## 2012-08-26 NOTE — Progress Notes (Signed)
Patient ID: Carla [name], female   DOB: May 15, 1922, 79 y.o.   MRN: [id number]    SUBJECTIVE: No complaints   BP 152/82  Pulse 73  Temp(Src) 97.9 F (36.6 C) (Oral)  Resp 18  Ht 5\' 2"  (1.575 m)  Wt 160 lb (72.576 kg)  BMI 29.26 kg/m2  SpO2 95%  Intake/Output Summary (Last 24 hours) at 08/26/12 0810 Last data filed at 08/25/12 2215  Gross per 24 hour  Intake    120 ml  Output   1201 ml  Net  -1081 ml    PHYSICAL EXAM General: Well developed, well nourished, in no acute distress. Alert and oriented x 3.  Psych:  Good affect, responds appropriately Neck: No JVD. No masses noted. Dressing on left side of neck Lungs: Decreased breath sounds at bases. No wheezes or rhonci noted.  Heart: RRR  Abdomen: Bowel sounds are present. Soft, non-tender.  Extremities: No lower extremity edema.   LABS: Basic Metabolic Panel:  Recent Labs  40/98/11 0450 08/26/12 0507  NA 145 139  K 3.5 3.3*  CL 107 100  CO2 29 31  GLUCOSE 116* 112*  BUN 17 11  CREATININE 1.24* 1.04  CALCIUM 7.8* 8.1*  MG 1.5 1.3*  PHOS 2.7 2.4   CBC:  Recent Labs  08/25/12 0450 08/26/12 0507  WBC 12.0* 14.9*  HGB 11.7* 13.4  HCT 36.2 40.5  MCV 90.7 88.8  PLT 276 301    Current Meds: . acidophilus  1 capsule Oral Daily  . amiodarone  200 mg Oral BID  . antiseptic oral rinse  15 mL Mouth Rinse BID  . cefTRIAXone (ROCEPHIN)  IV  1 g Intravenous Q24H  . estradiol  1 Applicatorful Vaginal 3 times weekly  . famotidine  10 mg Oral QHS  . fluconazole  100 mg Oral Daily  . furosemide  20 mg Oral Daily  . potassium chloride  10 mEq Oral Daily  . sodium chloride  3 mL Intravenous Q12H   Echo 08/21/12:  Left ventricle: The cavity size was normal. There was mild focal basal hypertrophy of the septum. The estimated ejection fraction was 55%. Wall motion was normal; there were no regional wall motion abnormalities. Features are consistent with a pseudonormal left ventricular filling pattern, with  concomitant abnormal relaxation and increased filling pressure (grade 2 diastolic dysfunction). - Mitral valve: Calcified annulus. Mildly thickened leaflets . - Right ventricle: The cavity size was mildly dilated. - Right atrium: The atrium was mildly dilated. - Atrial septum: No defect or patent foramen ovale was identified. - Pulmonary arteries: PA peak pressure: 52mm Hg (S). Impressions:  - The right ventricular systolic pressure was increased consistent with moderate pulmonary hypertension.    ASSESSMENT AND PLAN: 79 y.o. female with no history of CAD. She had a stress test for palpitations in 2012 that was negative. She was admitted on 2/15 with N&V, diarrhea. There was hematemesis and possibly some blood in her stool. She was acutely ill. She has been treated for a UTI. She was noted to have paroxysmal atrial fibrillation with rapid ventricular response. She was started on IV amiodarone which was changed to oral amiodarone on 08/23/12. Cardiology was asked to evaluate her. She was seen by Dr. Swaziland 08/24/12.   1. Atrial fibrillation with RVR: Sinus this am. Telemetry shows NSR no SVT or afib She may continue to have episodes of SVT/atrial fib during her acute illness. Continue current dose of amiodarone for 4 weeks then reduce to 200 mg  daily. She is a poor candidate for long term anticoagulation given advanced age and recent UGI bleed. Her bleeding risk is high now. Would pursue short term rhythm control strategy. We can plan an event monitor in 4-6 weeks which can be arranged as an outpatient with Dr. Culver Feighner Swaziland. If she has recurrent afib at that time then we could consider anticoagulation with low dose Eliquis if she has no further bleeding.   2. Acute Diastolic CHF: In setting of acute illness with aggressive volume resuscitation. Her BNP is high and weight has increased by 21 lbs this admit. Does not appear fluid loaded on exam and CXR is not c/w large fluid overload although there  are small bilateral pleural effusions.  Would continue with oral lasix for now.   No new recs will see again Monday  Charlton Haws  2/22/20148:10 AM

## 2012-08-26 NOTE — Progress Notes (Signed)
[facility] PROGRESS NOTE  Carla [name] ZOX:[id number] DOB: May 12, 1922 DOA: 08/19/2012 PCP: No primary provider on file.  Assessment/Plan: 1) RLL pneumonia, questionable aspiration.- rocephin- til 2/22 2) Hypoxemic respiratory failure  - 08/22/2012: Down to 2 L oxygen, 02 sat 96- may need to go home with O2- check O2 sats ambulating, decreased breath sounds- check chest x ray  3) Septic shock: off pressors  4) Elevated troponin with EKG ischemic changes likely due to demand ischemia. Troponin now normalized  5) Atrial fibrillation with RVR at admission - Currently NSR, but in and out of AF, cardiology following- Continue current dose of amiodarone for 4 weeks then reduce to 200 mg daily. She is a poor candidate for long term anticoagulation given advanced age and recent UGI bleed. Her bleeding risk is high now. Would pursue short term rhythm control strategy. We can plan an event monitor in 4-6 weeks which can be arranged as an outpatient with Dr. Peter Sw[place]. If she has recurrent afib at that time then we could consider anticoagulation with low dose Eliquis if she has no further bleeding.  6) echo 08/21/2012 with pulmonary artery systolic pressure of 50 with mild RV dilation and grade 2 left ventricular diastolic dysfunction  7) Acute renal failure due to sepsis: scr improved  8) Hiatal hernia  9) Mild hematemesis after intractable vomit, felt to be d/t recent viral illness. Diff dx include: gastritis and Mallory Judeth Porch but have to consider esophageal perforation in the differential.  - 08/22/2012 no nausea or vomiting blood. Tolerating regular diet  10) Diarrhea: Black watery diarrhea noted today, has not changed from 2/16 when C-diff was negative X 2  11) mild anemia: no new evidence of bleeding  12) Ecoli UTI: treated  13) candidiasis peroneum- diflucan day #7 14) deconditioning - PT/OT   Code Status: full Family Communication: called daughter Disposition Plan: home 1-2  days   Consultants:  transfer from ICU  cardiology  Procedures:  Left IJ 2/16-2/19    Antibiotics:  rocephin  HPI/Subjective: Feeling well today No CP, no fevers  Objective: Filed Vitals:   08/25/12 1124 08/25/12 1505 08/25/12 2200 08/26/12 0654  BP:  167/77 163/85 152/82  Pulse:  86 79 73  Temp:  98.1 F (36.7 C) 98.1 F (36.7 C) 97.9 F (36.6 C)  TempSrc:  Oral Oral Oral  Resp:  19 18 18   Height:      Weight:      SpO2: 78% 95% 95% 95%    Intake/Output Summary (Last 24 hours) at 08/26/12 0913 Last data filed at 08/26/12 0849  Gross per 24 hour  Intake      0 ml  Output   2301 ml  Net  -2301 ml   Filed Weights   08/21/12 0500 08/23/12 0400 08/25/12 0600  Weight: 75.6 kg (166 lb 10.7 oz) 77.9 kg (171 lb 11.8 oz) 72.576 kg (160 lb)    Exam:   General:  A+Ox3, NAd  Cardiovascular: rrr  Respiratory: clear anterior  Abdomen: +BS, soft, NT  Data Reviewed: Basic Metabolic Panel:  Recent Labs Lab 08/22/12 0530 08/23/12 0418 08/24/12 0350 08/25/12 0450 08/26/12 0507  NA 142 140 143 145 139  K 4.1 3.9 3.6 3.5 3.3*  CL 110 110 112 107 100  CO2 23 24 25 29 31   GLUCOSE 90 117* 107* 116* 112*  BUN 44* 33* 23 17 11   CREATININE 2.05* 1.68* 1.41* 1.24* 1.04  CALCIUM 7.7* 7.7* 8.0* 7.8* 8.1*  MG 1.9 1.6  1.4* 1.5 1.3*  PHOS 2.3 2.0* 3.3 2.7 2.4   Liver Function Tests:  Recent Labs Lab 08/22/12 0530 08/23/12 0418 08/24/12 0350 08/25/12 0450 08/26/12 0507  AST 21 17 15 17 17   ALT 14 14 13 14 14   ALKPHOS 66 76 77 66 75  BILITOT 0.2* 0.2* 0.2* 0.1* 0.2*  PROT 5.2* 4.8* 5.3* 5.5* 6.1  ALBUMIN 1.6* 1.7* 1.8* 1.6* 1.8*    Recent Labs Lab 08/19/12 1034  LIPASE 25   No results found for this basename: AMMONIA,  in the last 168 hours CBC:  Recent Labs Lab 08/19/12 1034  08/22/12 0530 08/23/12 0418 08/24/12 0350 08/25/12 0450 08/26/12 0507  WBC 27.6*  < > 14.7* 14.4* 10.9* 12.0* 14.9*  NEUTROABS 22.6*  --   --   --   --   --   --    HGB 16.7*  < > 10.6* 10.6* 11.9* 11.7* 13.4  HCT 49.5*  < > 32.6* 33.5* 37.2 36.2 40.5  MCV 89.4  < > 90.8 91.8 91.0 90.7 88.8  PLT 332  < > 197 246 242 276 301  < > = values in this interval not displayed. Cardiac Enzymes:  Recent Labs Lab 08/19/12 1316 08/19/12 1951 08/21/12 0110 08/21/12 0510 08/21/12 1205  TROPONINI 0.47* 0.34* <0.30 <0.30 <0.30   BNP (last 3 results)  Recent Labs  08/23/12 0418 08/24/12 0350  PROBNP 7669.0* 9342.0*   CBG: No results found for this basename: GLUCAP,  in the last 168 hours  Recent Results (from the past 240 hour(s))  CULTURE, BLOOD (ROUTINE X 2)     Status: None   Collection Time    08/19/12  2:03 PM      Result Value Range Status   Specimen Description BLOOD LEFT HAND   Final   Special Requests BOTTLES DRAWN AEROBIC ONLY 1CC   Final   Culture  Setup Time 08/19/2012 18:03   Final   Culture NO GROWTH 5 DAYS   Final   Report Status 08/25/2012 FINAL   Final  CULTURE, BLOOD (ROUTINE X 2)     Status: None   Collection Time    08/19/12  2:10 PM      Result Value Range Status   Specimen Description BLOOD RIGHT HAND   Final   Special Requests BOTTLES DRAWN AEROBIC ONLY 1CC   Final   Culture  Setup Time 08/19/2012 18:03   Final   Culture NO GROWTH 5 DAYS   Final   Report Status 08/25/2012 FINAL   Final  MRSA PCR SCREENING     Status: None   Collection Time    08/19/12  3:10 PM      Result Value Range Status   MRSA by PCR NEGATIVE  NEGATIVE Final   Comment:            The GeneXpert MRSA Assay (FDA     approved for NASAL specimens     only), is one component of a     comprehensive MRSA colonization     surveillance program. It is not     intended to diagnose MRSA     infection nor to guide or     monitor treatment for     MRSA infections.  URINE CULTURE     Status: None   Collection Time    08/19/12  5:27 PM      Result Value Range Status   Specimen Description URINE, CLEAN CATCH   Final   Special Requests  NONE   Final    Culture  Setup Time 08/20/2012 02:14   Final   Colony Count >=100,000 COLONIES/ML   Final   Culture ESCHERICHIA COLI   Final   Report Status 08/22/2012 FINAL   Final   Organism ID, Bacteria ESCHERICHIA COLI   Final  CLOSTRIDIUM DIFFICILE BY PCR     Status: None   Collection Time    08/23/12 10:45 AM      Result Value Range Status   C difficile by pcr NEGATIVE  NEGATIVE Final     Studies: Dg Chest 2 View  08/24/2012  *RADIOLOGY REPORT*  Clinical Data: Shortness of breath and weakness.  Pleural effusion evaluation.  CHEST - 2 VIEW  Comparison: 08/21/2012.  CT 08/21/2012.  Findings: Cardiac silhouette is upper normal size.  Cardiac silhouette has partially obscured on the PA image by a moderately large hiatal hernia.  There is minimal left basilar atelectasis associated with a small left pleural effusion.  There is increased aeration of the left lung base compared to previous study.  There is continued infiltrative density and atelectasis and opacity in the lower portion of the right lung with associated right pleural effusion.  The right pleural effusion appears to have increased slightly since previous study.  There is an osteopenic appearance of the bones.  Changes of degenerative disc disease and degenerative spondylosis are present. Ectasia and nonaneurysmal calcification of the thoracic aorta are seen.  IMPRESSION: Improved aeration in left base.  Minimal left basilar atelectasis and infiltrate with small left pleural effusion.  There is continued infiltrative density and atelectasis and opacity in the lower portion of the right lung with associated right pleural effusion.  The right pleural effusion appears to have increased slightly since previous study.  A moderately large hiatal hernia.  Other chronic findings appear stable and are detailed above.   Original Report Authenticated By: Onalee Hua Call     Scheduled Meds: . acidophilus  1 capsule Oral Daily  . amiodarone  200 mg Oral BID  .  antiseptic oral rinse  15 mL Mouth Rinse BID  . cefTRIAXone (ROCEPHIN)  IV  1 g Intravenous Q24H  . estradiol  1 Applicatorful Vaginal 3 times weekly  . famotidine  10 mg Oral QHS  . fluconazole  100 mg Oral Daily  . furosemide  20 mg Oral Daily  . potassium chloride  10 mEq Oral Daily  . sodium chloride  3 mL Intravenous Q12H   Continuous Infusions:   Principal Problem:   Gram negative sepsis Active Problems:   Iron deficiency anemia   High cholesterol   Hemorrhoids   Intractable nausea and vomiting   Hypotension, unspecified   Dehydration   Atrial fibrillation   Leukocytosis, unspecified   Acute kidney injury   Hypovolemic shock   Urinary tract infection    Time spent: 35    [facility], Yazeed Pryer  [facility] Pager [phone]. If 8PM-8AM, please contact night-coverage at [email or web address], password [facility] 08/26/2012, 9:13 AM  LOS: 7 days

## 2012-08-27 ENCOUNTER — Inpatient Hospital Stay (HOSPITAL_COMMUNITY): Payer: Medicare Other

## 2012-08-27 LAB — COMPREHENSIVE METABOLIC PANEL
AST: 17 U/L (ref 0–37)
Albumin: 1.9 g/dL — ABNORMAL LOW (ref 3.5–5.2)
BUN: 11 mg/dL (ref 6–23)
Calcium: 8 mg/dL — ABNORMAL LOW (ref 8.4–10.5)
Chloride: 98 mEq/L (ref 96–112)
Creatinine, Ser: 1.05 mg/dL (ref 0.50–1.10)
Total Protein: 5.6 g/dL — ABNORMAL LOW (ref 6.0–8.3)

## 2012-08-27 LAB — CBC
HCT: 37.3 % (ref 36.0–46.0)
Hemoglobin: 12.1 g/dL (ref 12.0–15.0)
MCH: 28.8 pg (ref 26.0–34.0)
MCV: 88.8 fL (ref 78.0–100.0)
Platelets: 315 10*3/uL (ref 150–400)
RBC: 4.2 MIL/uL (ref 3.87–5.11)
WBC: 13.4 10*3/uL — ABNORMAL HIGH (ref 4.0–10.5)

## 2012-08-27 MED ORDER — FLUCONAZOLE 100 MG PO TABS: 100.0000 mg | ORAL_TABLET | Freq: Every day | ORAL | Status: DC

## 2012-08-27 MED ORDER — AMIODARONE HCL 200 MG PO TABS: ORAL_TABLET | ORAL | Status: DC

## 2012-08-27 MED ORDER — POTASSIUM CHLORIDE CRYS ER 20 MEQ PO TBCR: 20.0000 meq | EXTENDED_RELEASE_TABLET | Freq: Every day | ORAL | Status: DC

## 2012-08-27 MED ORDER — POTASSIUM CHLORIDE CRYS ER 20 MEQ PO TBCR
40.0000 meq | EXTENDED_RELEASE_TABLET | ORAL | Status: AC
  Administered 2012-08-27 (×2): 40 meq via ORAL
  Filled 2012-08-27 (×2): qty 2

## 2012-08-27 MED ORDER — RISAQUAD PO CAPS: 1.0000 | ORAL_CAPSULE | Freq: Every day | ORAL | Status: DC

## 2012-08-27 MED ORDER — FUROSEMIDE 20 MG PO TABS: 20.0000 mg | ORAL_TABLET | Freq: Every day | ORAL | Status: DC

## 2012-08-27 NOTE — Care Management Note (Signed)
    Page 1 of 2   08/27/2012     4:49:11 PM   CARE MANAGEMENT NOTE 08/27/2012  Patient:  Carla [name], Carla [name]   Account Number:  [id number]  Date Initiated:  08/20/2012  Documentation initiated by:  [facility]  Subjective/Objective Assessment:   79 year old female admitted with shock.     Action/Plan:   Pt lived at home PTA.   Anticipated DC Date:  08/27/2012   Anticipated DC Plan:  HOME W HOME HEALTH SERVICES      DC Planning Services  CM consult      Choice offered to / List presented to:  C-1 Patient        HH arranged  HH-1 RN  HH-2 PT      [facility] agency  [facility]   Status of service:  Completed, signed off Medicare Important Message given?  NA - LOS <3 / Initial given by admissions (If response is "NO", the following Medicare IM given date fields will be blank) Date Medicare IM given:   Date Additional Medicare IM given:    Discharge Disposition:  HOME W HOME HEALTH SERVICES  Per UR Regulation:  Reviewed for med. necessity/level of care/duration of stay  If discussed at Long Length of Stay Meetings, dates discussed:    Comments:  08/27/12 Jacarius Handel RN,BSN  NCM [phone] [facility] CHOSEN,FAXED W/CONFIRMATION TO #[phone] HH ORDERS,H&P,D/C SUMMARY,FACE SHEET,F2F.NO HOME 02 NEEDED.  [id number] Earlene Plater, RN, BSN, CCM:  CHART REVIEWED AND UPDATED.  Next chart review due on [id number].  A.fib with rvr, iv amnio continuous drip, pna NO DISCHARGE NEEDS PRESENT AT THIS TIME. CASE MANAGEMENT [phone]

## 2012-08-27 NOTE — Progress Notes (Signed)
Patient ID: Carla [name], female   DOB: 11-18-1921, 79 y.o.   MRN: [id number]    SUBJECTIVE: No complaints   BP 148/78  Pulse 74  Temp(Src) 98.2 F (36.8 C) (Oral)  Resp 18  Ht 5\' 2"  (1.575 m)  Wt 172 lb 9.6 oz (78.291 kg)  BMI 31.56 kg/m2  SpO2 96%  Intake/Output Summary (Last 24 hours) at 08/27/12 0809 Last data filed at 08/27/12 0600  Gross per 24 hour  Intake      0 ml  Output   1701 ml  Net  -1701 ml    PHYSICAL EXAM General: Well developed, well nourished, in no acute distress. Alert and oriented x 3.  Psych:  Good affect, responds appropriately Neck: No JVD. No masses noted. Dressing on left side of neck Lungs: Decreased breath sounds at bases. No wheezes or rhonci noted.  Heart: RRR  Abdomen: Bowel sounds are present. Soft, non-tender.  Extremities: No lower extremity edema.   LABS: Basic Metabolic Panel:  Recent Labs  40/98/11 0450 08/26/12 0507 08/27/12 0540  NA 145 139 139  K 3.5 3.3* 3.1*  CL 107 100 98  CO2 29 31 34*  GLUCOSE 116* 112* 104*  BUN 17 11 11   CREATININE 1.24* 1.04 1.05  CALCIUM 7.8* 8.1* 8.0*  MG 1.5 1.3*  --   PHOS 2.7 2.4  --    CBC:  Recent Labs  08/26/12 0507 08/27/12 0540  WBC 14.9* 13.4*  HGB 13.4 12.1  HCT 40.5 37.3  MCV 88.8 88.8  PLT 301 315    Current Meds: . acidophilus  1 capsule Oral Daily  . amiodarone  200 mg Oral BID  . antiseptic oral rinse  15 mL Mouth Rinse BID  . cefTRIAXone (ROCEPHIN)  IV  1 g Intravenous Q24H  . estradiol  1 Applicatorful Vaginal 3 times weekly  . famotidine  10 mg Oral QHS  . fluconazole  100 mg Oral Daily  . furosemide  20 mg Oral Daily  . potassium chloride  10 mEq Oral Daily  . sodium chloride  3 mL Intravenous Q12H   Echo 08/21/12:  Left ventricle: The cavity size was normal. There was mild focal basal hypertrophy of the septum. The estimated ejection fraction was 55%. Wall motion was normal; there were no regional wall motion abnormalities. Features  are consistent with a pseudonormal left ventricular filling pattern, with concomitant abnormal relaxation and increased filling pressure (grade 2 diastolic dysfunction). - Mitral valve: Calcified annulus. Mildly thickened leaflets . - Right ventricle: The cavity size was mildly dilated. - Right atrium: The atrium was mildly dilated. - Atrial septum: No defect or patent foramen ovale was identified. - Pulmonary arteries: PA peak pressure: 52mm Hg (S). Impressions:  - The right ventricular systolic pressure was increased consistent with moderate pulmonary hypertension.    ASSESSMENT AND PLAN: 79 y.o. female with no history of CAD. She had a stress test for palpitations in 2012 that was negative. She was admitted on 2/15 with N&V, diarrhea. There was hematemesis and possibly some blood in her stool. She was acutely ill. She has been treated for a UTI. She was noted to have paroxysmal atrial fibrillation with rapid ventricular response. She was started on IV amiodarone which was changed to oral amiodarone on 08/23/12. Cardiology was asked to evaluate her. She was seen by Dr. Swaziland 08/24/12.   1. Atrial fibrillation with RVR: Sinus this am. Telemetry shows NSR no SVT or afib Occasional PVC s  She may  continue to have episodes of SVT/atrial fib during her acute illness. Continue current dose of amiodarone for 4 weeks then reduce to 200 mg daily. She is a poor candidate for long term anticoagulation given advanced age and recent UGI bleed. Her bleeding risk is high now. Would pursue short term rhythm control strategy. We can plan an event monitor in 4-6 weeks which can be arranged as an outpatient with Dr. Falen Lehrmann [place]. If she has recurrent afib at that time then we could consider anticoagulation with low dose Eliquis if she has no further bleeding.   2. Acute Diastolic CHF: In setting of acute illness with aggressive volume resuscitation. Her BNP is high and weight has increased by 21 lbs this admit.  Does not appear fluid loaded on exam and CXR is not c/w large fluid overload although there are small bilateral pleural effusions.  Would continue with oral lasix for now.   Will sign off  Charlton Haws  2/23/20148:09 AM

## 2012-08-27 NOTE — Discharge Summary (Addendum)
Physician Discharge Summary  BELEM HINTZE ZOX:[id number] DOB: 25-Apr-1922 DOA: 08/19/2012  PCP: Dr. Cyndia Bent  Admit date: 08/19/2012 Discharge date: 08/27/2012  Time spent: 35 minutes  Recommendations for Outpatient Follow-up:  1. Home health RN/PT 2. Self cath q 6 hours PRN- home health to do education 3. CBC, BMP in 1 week 4. Continue current dose of amiodarone for 4 weeks then reduce to 200 mg daily 5. event monitor in 4-6 weeks which can be arranged as an outpatient with Dr. Peter Swaziland. If she has recurrent afib at that time then we could consider anticoagulation with low dose Eliquis if she has no further bleeding   Discharge Diagnoses:  Principal Problem:   Gram negative sepsis Active Problems:   Iron deficiency anemia   High cholesterol   Hemorrhoids   Intractable nausea and vomiting   Hypotension, unspecified   Dehydration   Atrial fibrillation   Leukocytosis, unspecified   Acute kidney injury   Hypovolemic shock   Urinary tract infection   Discharge Condition: improved  Diet recommendation: cardiac  Filed Weights   08/23/12 0400 08/25/12 0600 08/27/12 0600  Weight: 77.9 kg (171 lb 11.8 oz) 72.576 kg (160 lb) 78.291 kg (172 lb 9.6 oz)    History of present illness:  The patient is a 79 y.o. year-old F with history of hyperlipidemia, osteoarthritis, osteoporosis who presents with 5 days of severe nausea and vomiting. She states that she was in her usual health 6 days ago. Five days ago, she started feeling sick to her stomach with some mild pressure or cramping in the lower abdomen. She then started vomiting constantly "every 10 minutes or less" almost all night. Her N/V eased a little the subsequent day and she did not have much vomiting. The last 2 days prior to admission, however, she has not attempted any food and she has had only sips of water and soda which she promptly vomited. Nothing has made her N/V better and drinking liquids seems to make it worse. For  the last three days she has noticed some streaks of bright red blood and some dark vomit with possibly some small amount of coffee grounds. She has had no BMs until the last 24 hours and she has had a BM that was balls of stool with some blood at the end, which she attributed to her hemorrhoids. There was dark liquid mixed in with her stool. Her family brought her to the ER today for further evaluation and for dehydration.  In the ER, she was hypotensive to 72/40 and tachycardic to the 130s/140s in new onset atrial fibrillation. Labs were notable for WBC 27.6, hgb 16.7, plt 332, potassium 2.9, BUN 70, creatinine 3.85, troponin 0.47. CT abd/pelvis demonstrated possible diverticulitis at the distal descending colon.  Severe nausea and vomiting   She has vomited bright red blood and some dark material.  Sore throat  Severe back pain that is alleviated by walking, it becomes pressure when she walks. Positioning helps with the pain and heat pack   Hospital Course:  1) RLL pneumonia, questionable aspiration.- rocephin- til 2/22  2) Hypoxemic respiratory failure  - 08/22/2012: Down to 2 L oxygen, 02 sat 96- chest x ray shows improved airation, patient does not qualify for home O2- 91% with ambulation 3) Septic shock: off pressors  4) Elevated troponin with EKG ischemic changes likely due to demand ischemia. Troponin now normalized  5) Atrial fibrillation with RVR at admission - Currently NSR, but in and out of AF,  cardiology following- Continue current dose of amiodarone for 4 weeks then reduce to 200 mg daily. She is a poor candidate for long term anticoagulation given advanced age and recent UGI bleed. Her bleeding risk is high now. Would pursue short term rhythm control strategy. We can plan an event monitor in 4-6 weeks which can be arranged as an outpatient with Dr. Peter Swaziland. If she has recurrent afib at that time then we could consider anticoagulation with low dose Eliquis if she has no further  bleeding.  6) echo 08/21/2012 with pulmonary artery systolic pressure of 50 with mild RV dilation and grade 2 left ventricular diastolic dysfunction - patient did increase weight by quite a but, continue lasix and Kdur- monitoring renal function and K+ 7) Acute renal failure due to sepsis: scr improved  8) Hiatal hernia  9) Mild hematemesis after intractable vomit, felt to be d/t recent viral illness.  10) Diarrhea: Black watery diarrhea noted today, has not changed from 2/16 when C-diff was negative X 2  11) mild anemia: no new evidence of bleeding  12) Ecoli UTI: treated  13) candidiasis: treat with diflucan 14) deconditioning - PT/OT 15) obese   Procedures:  none  Consultations:  Critical care- transfer out ICU  Cardiology    Discharge Exam: Filed Vitals:   08/26/12 1407 08/26/12 2200 08/27/12 0600 08/27/12 1100  BP: 147/78 129/73 148/78   Pulse: 85 80 74   Temp: 98.2 F (36.8 C) 98.7 F (37.1 C) 98.2 F (36.8 C)   TempSrc: Oral Oral Oral   Resp: 18 18 18    Height:      Weight:   78.291 kg (172 lb 9.6 oz)   SpO2: 96% 96% 96% 91%    General: A+Ox3, NAD Cardiovascular:  rrr Respiratory: clear anterior  Discharge Instructions      Discharge Orders   Future Appointments Provider Department Dept Phone   08/31/2012 4:20 PM [facility]  IMAGING [phone]   Patient should wear two piece clothing and wear no powder or deodorant. Patient should arrive 15 minutes early.   Future Orders Complete By Expires     Diet - low sodium heart healthy  As directed     Discharge instructions  As directed     Comments:      Home health PT/RN Self cath q 6 hours PRN- until follow up with urology    Increase activity slowly  As directed         Medication List    STOP taking these medications       aspirin EC 81 MG tablet     NIACIN CR PO      TAKE these medications       acidophilus Caps  Take 1 capsule by mouth daily.      amiodarone 200 MG tablet  Commonly known as:  PACERONE  200 mg PO BID x 3 weeks then 200 mg daily     calcium-vitamin D 500-200 MG-UNIT per tablet  Commonly known as:  OSCAL WITH D  Take 1 tablet by mouth daily.     cholecalciferol 1000 UNITS tablet  Commonly known as:  VITAMIN D  Take 1,000 Units by mouth daily.     conjugated estrogens vaginal cream  Commonly known as:  PREMARIN  Place 1 g vaginally 3 (three) times a week.     ferrous sulfate 325 (65 FE) MG tablet  Take 325 mg by mouth daily with breakfast.  fluconazole 100 MG tablet  Commonly known as:  DIFLUCAN  Take 1 tablet (100 mg total) by mouth daily.     furosemide 20 MG tablet  Commonly known as:  LASIX  Take 1 tablet (20 mg total) by mouth daily.     potassium chloride SA 20 MEQ tablet  Commonly known as:  K-DUR,KLOR-CON  Take 1 tablet (20 mEq total) by mouth daily.  Start taking on:  08/28/2012     vitamin C 500 MG tablet  Commonly known as:  ASCORBIC ACID  Take 500 mg by mouth daily.       Follow-up Information   Follow up with Eskridge, Lowella Petties, MD. (call for an appointment)    Contact information:   [address] [place] [address] [phone]       Follow up with [name], MD In 1 week. (for CBC, BMP)    Contact information:   [address] [place] [address] [phone]       Follow up with Peter Swaziland, MD. (call for appointment: event monitor in 4-6 weeks)    Contact information:   [address] [place] [address] [phone]        The results of significant diagnostics from this hospitalization (including imaging, microbiology, ancillary and laboratory) are listed below for reference.    Significant Diagnostic Studies: Ct Abdomen Pelvis Wo Contrast  08/19/2012  *RADIOLOGY REPORT*  Clinical Data: 79 year old female with abdominal pelvic pain and vomiting.  Elevated white count.  CT ABDOMEN AND PELVIS WITHOUT CONTRAST   Technique:  Multidetector CT imaging of the abdomen and pelvis was performed following the standard protocol without intravenous contrast.  Comparison: 02/2013 radiographs  Findings: A very large hiatal hernia is noted.  The liver, spleen and adrenal glands are unremarkable. The pancreas and kidneys are mildly atrophic with a left renal cyst identified. The patient is status post cholecystectomy.  Please note that parenchymal abnormalities may be missed as intravenous contrast was not administered.  Diffuse colonic diverticulosis noted with equivocal minimal stranding along the mid - distal descending colon - very mild diverticulitis is not excluded.  There is no evidence of pneumoperitoneum, focal abscess or bowel obstruction. The appendix is normal.  No free fluid, enlarged lymph nodes, biliary dilation or abdominal aortic aneurysm identified.  A pessary is noted.  No acute or suspicious bony abnormalities are noted.  Degenerative changes throughout the lumbar spine are present.  IMPRESSION: Very large hiatal hernia.  Possible very mild diverticulitis along the mid - distal descending colon - no evidence of pneumoperitoneum, focal abscess or bowel obstruction.  Mildly atrophic kidneys.   Original Report Authenticated By: Harmon Pier, M.D.    Dg Chest 2 View  08/27/2012  *RADIOLOGY REPORT*  Clinical Data: Hypoxia and shortness of breath.  CHEST - 2 VIEW  Comparison: 08/26/2012  Findings: There is improvement in aeration of both lungs with some residual atelectasis/infiltrate remaining at the right lung base. Bilateral pleural effusions remain, right greater than left.  No pulmonary edema.  Stable large hiatal hernia.  IMPRESSION: Improved aeration of both lungs.  Residual atelectasis/infiltrate at the right lung base and bilateral pleural effusions, right greater than left.   Original Report Authenticated By: Irish Lack, M.D.    Dg Chest 2 View  08/24/2012  *RADIOLOGY REPORT*  Clinical Data: Shortness of  breath and weakness.  Pleural effusion evaluation.  CHEST - 2 VIEW  Comparison: 08/21/2012.  CT 08/21/2012.  Findings: Cardiac silhouette is upper normal size.  Cardiac silhouette has partially obscured on the PA image by a moderately large hiatal hernia.  There is minimal left basilar atelectasis associated with a small left pleural effusion.  There is increased aeration of the left lung base compared to previous study.  There is continued infiltrative density and atelectasis and opacity in the lower portion of the right lung with associated right pleural effusion.  The right pleural effusion appears to have increased slightly since previous study.  There is an osteopenic appearance of the bones.  Changes of degenerative disc disease and degenerative spondylosis are present. Ectasia and nonaneurysmal calcification of the thoracic aorta are seen.  IMPRESSION: Improved aeration in left base.  Minimal left basilar atelectasis and infiltrate with small left pleural effusion.  There is continued infiltrative density and atelectasis and opacity in the lower portion of the right lung with associated right pleural effusion.  The right pleural effusion appears to have increased slightly since previous study.  A moderately large hiatal hernia.  Other chronic findings appear stable and are detailed above.   Original Report Authenticated By: Onalee Hua Call    Ct Chest Wo Contrast  08/21/2012  *RADIOLOGY REPORT*  Clinical Data: Worsening infiltrate and effusion.  CT CHEST WITHOUT CONTRAST  Technique:  Multidetector CT imaging of the chest was performed following the standard protocol without IV contrast.  Comparison: 08/20/2012 radiograph  Findings: Aortic atherosclerosis and tortuosity.  Heart size upper normal.  Aortic valve and coronary artery calcifications.  Left IJ central venous catheter tip projects at the cavoatrial junction.  Large hiatal hernia with organoaxial rotation.  Small bilateral pleural effusions.  Bibasilar  airspace opacities and small effusions.  Clustered nodularity within the right middle lobe and lingula and to a lesser extent the right upper lobe.  No pneumothorax.  No pneumomediastinum.  Limited images through the upper abdomen show no acute finding. Colonic diverticulosis. Elevated right hemidiaphragm.  Multilevel degenerative changes.  No acute osseous finding. Exaggerated kyphosis.  IMPRESSION: Bibasilar airspace opacities and nodular opacities within the middle lobe and lingula; favor aspiration pneumonia.  Small effusions.  Large hiatal hernia with organoaxial rotation.   Original Report Authenticated By: Jearld Lesch, M.D.    [facility] 1 View  08/26/2012  *RADIOLOGY REPORT*  Clinical Data: Decreased breath sounds.  PORTABLE CHEST - 1 VIEW  Comparison: 08/24/2012  Findings: Portable view of the chest was obtained.  Patient is rotated towards the right.  There is concern for increased densities in the right upper lung region.  There are persistent bibasilar densities which could represent atelectasis and pleural fluid.  Retrocardiac density is compatible with hiatal hernia. Degenerative changes in the right shoulder.  IMPRESSION: Concern for increased densities and airspace disease in the right upper lung.  Persistent bibasilar densities are suggestive for volume loss and pleural fluid.  Hiatal hernia.   Original Report Authenticated By: Richarda Overlie, M.D.    Dg Chest Port 1 View  08/21/2012  *RADIOLOGY REPORT*  Clinical Data: Evaluate for pneumonia.Pleural effusions.  PORTABLE CHEST - 1 VIEW  Comparison: Chest x-ray 08/20/2012.  Findings: Lung volumes have slightly improved.  Extensive bibasilar opacities may reflect areas of atelectasis and/or consolidation with superimposed small to moderate bilateral pleural effusions (right greater than left).  Patchy interstitial and airspace opacities are also noted in the right mid and upper lung as well. No evidence of pulmonary edema.  Heart size is  within normal limits. The patient is rotated to  the right on today's exam, resulting in distortion of the mediastinal contours and reduced diagnostic sensitivity and specificity for mediastinal pathology. Atherosclerosis in the thoracic aorta. There is a left-sided internal jugular central venous catheter with tip terminating in the superior cavoatrial junction.  IMPRESSION: 1.  Allowing for slight differences in patient positioning, and slight improvement in overall lung volumes, the radiographic appearance of chest is essentially unchanged, as above.   Original Report Authenticated By: Trudie Reed, M.D.    Dg Chest Port 1 View  08/20/2012  *RADIOLOGY REPORT*  Clinical Data: Central line placement  PORTABLE CHEST - 1 VIEW  Comparison: 08/20/2012  Findings: Degraded by rotation.  Interval left IJ catheter placed with tip projecting over the mid SVC. No pneumothorax.  Increased bilateral pleural effusions.  Patchy airspace opacities bilaterally, right greater than left.  Cardiomediastinal contours are obscured.  There is suggestion of a hiatal hernia. Atherosclerotic disease.  Osteopenia.  IMPRESSION: Interval left IJ catheter placed with tip projecting over the mid SVC.  No pneumothorax.  Increased bilateral pleural effusions and airspace opacities; infection versus edema.   Original Report Authenticated By: Jearld Lesch, M.D.    Dg Chest Port 1 View  08/20/2012  *RADIOLOGY REPORT*  Clinical Data: Hypoxia  PORTABLE CHEST - 1 VIEW  Comparison: 08/20/2012  Findings: Large hiatal hernia is stable.  Airspace opacities at the right base have increased.  Minimal atelectasis at the left base. No pneumothorax.  Degenerative changes of the right glenohumeral joint are noted. Increased opacity over the right first rib may simply represent confluence of shadows.  Increasing airspace disease is not excluded.  IMPRESSION: Increasing airspace disease at the right base.  Possible increasing airspace disease in the  right upper lobe.   Original Report Authenticated By: Jolaine Click, M.D.    Dg Chest Port 1 View  08/20/2012  *RADIOLOGY REPORT*  Clinical Data: Decreased O2 saturation.  PORTABLE CHEST - 1 VIEW  Comparison: Chest radiograph performed 08/19/2012  Findings: There is mild elevation of the right hemidiaphragm, with associated atelectasis.  Lung expansion is mildly decreased.  Vague mild left midlung zone opacity could reflect a mild infectious process.  Apparent right upper lung zone opacity is thought to reflect the overlying costochondral junction.  No pleural effusion or pneumothorax is seen.  The cardiomediastinal silhouette is borderline normal in size; a large hiatal hernia is again noted.  No acute osseous abnormalities are seen.  Degenerative change is noted at the right glenohumeral joint, with cortical irregularity and sclerotic change, and underlying osteophyte formation.  IMPRESSION:  1.  Mild elevation of the right hemidiaphragm, with associated atelectasis; vague mild left midlung zone opacity could reflect a mild infectious process. 2.  Large hiatal hernia again seen.   Original Report Authenticated By: Tonia Ghent, M.D.    Dg Chest Port 1 View  08/19/2012  *RADIOLOGY REPORT*  Clinical Data:  Pain and vomiting.  PORTABLE CHEST - 1 VIEW  Comparison: None  Findings: Cardiomegaly is present. There is no evidence of focal airspace disease, pulmonary edema, suspicious pulmonary nodule/mass, pleural effusion, or pneumothorax. No acute bony abnormalities are identified. Degenerative changes within the shoulders identified.  IMPRESSION: Cardiomegaly without evidence of acute cardiopulmonary disease.   Original Report Authenticated By: Harmon Pier, M.D.    Dg Abd Portable 2v  08/19/2012  *RADIOLOGY REPORT*  Clinical Data: Abdominal pain and vomiting.  PORTABLE ABDOMEN - 2 VIEW  Comparison: None  Findings: The bowel gas pattern is unremarkable. A small to moderate amount  of stool in the colon is noted.   No dilated bowel loops are identified. There is no evidence of pneumoperitoneum. Degenerative changes in the hips and lumbar spine are present.  IMPRESSION: No evidence of acute abnormality - unremarkable bowel gas pattern.   Original Report Authenticated By: Harmon Pier, M.D.     Microbiology: Recent Results (from the past 240 hour(s))  CULTURE, BLOOD (ROUTINE X 2)     Status: None   Collection Time    08/19/12  2:03 PM      Result Value Range Status   Specimen Description BLOOD LEFT HAND   Final   Special Requests BOTTLES DRAWN AEROBIC ONLY 1CC   Final   Culture  Setup Time 08/19/2012 18:03   Final   Culture NO GROWTH 5 DAYS   Final   Report Status 08/25/2012 FINAL   Final  CULTURE, BLOOD (ROUTINE X 2)     Status: None   Collection Time    08/19/12  2:10 PM      Result Value Range Status   Specimen Description BLOOD RIGHT HAND   Final   Special Requests BOTTLES DRAWN AEROBIC ONLY 1CC   Final   Culture  Setup Time 08/19/2012 18:03   Final   Culture NO GROWTH 5 DAYS   Final   Report Status 08/25/2012 FINAL   Final  MRSA PCR SCREENING     Status: None   Collection Time    08/19/12  3:10 PM      Result Value Range Status   MRSA by PCR NEGATIVE  NEGATIVE Final   Comment:            The GeneXpert MRSA Assay (FDA     approved for NASAL specimens     only), is one component of a     comprehensive MRSA colonization     surveillance program. It is not     intended to diagnose MRSA     infection nor to guide or     monitor treatment for     MRSA infections.  URINE CULTURE     Status: None   Collection Time    08/19/12  5:27 PM      Result Value Range Status   Specimen Description URINE, CLEAN CATCH   Final   Special Requests NONE   Final   Culture  Setup Time 08/20/2012 02:14   Final   Colony Count >=100,000 COLONIES/ML   Final   Culture ESCHERICHIA COLI   Final   Report Status 08/22/2012 FINAL   Final   Organism ID, Bacteria ESCHERICHIA COLI   Final  CLOSTRIDIUM DIFFICILE BY  PCR     Status: None   Collection Time    08/23/12 10:45 AM      Result Value Range Status   C difficile by pcr NEGATIVE  NEGATIVE Final     Labs: Basic Metabolic Panel:  Recent Labs Lab 08/22/12 0530 08/23/12 0418 08/24/12 0350 08/25/12 0450 08/26/12 0507 08/27/12 0540  NA 142 140 143 145 139 139  K 4.1 3.9 3.6 3.5 3.3* 3.1*  CL 110 110 112 107 100 98  CO2 23 24 25 29 31  34*  GLUCOSE 90 117* 107* 116* 112* 104*  BUN 44* 33* 23 17 11 11   CREATININE 2.05* 1.68* 1.41* 1.24* 1.04 1.05  CALCIUM 7.7* 7.7* 8.0* 7.8* 8.1* 8.0*  MG 1.9 1.6 1.4* 1.5 1.3*  --   PHOS 2.3 2.0* 3.3 2.7 2.4  --    Liver  Function Tests:  Recent Labs Lab 08/23/12 0418 08/24/12 0350 08/25/12 0450 08/26/12 0507 08/27/12 0540  AST 17 15 17 17 17   ALT 14 13 14 14 13   ALKPHOS 76 77 66 75 70  BILITOT 0.2* 0.2* 0.1* 0.2* 0.2*  PROT 4.8* 5.3* 5.5* 6.1 5.6*  ALBUMIN 1.7* 1.8* 1.6* 1.8* 1.9*   No results found for this basename: LIPASE, AMYLASE,  in the last 168 hours No results found for this basename: AMMONIA,  in the last 168 hours CBC:  Recent Labs Lab 08/23/12 0418 08/24/12 0350 08/25/12 0450 08/26/12 0507 08/27/12 0540  WBC 14.4* 10.9* 12.0* 14.9* 13.4*  HGB 10.6* 11.9* 11.7* 13.4 12.1  HCT 33.5* 37.2 36.2 40.5 37.3  MCV 91.8 91.0 90.7 88.8 88.8  PLT 246 242 276 301 315   Cardiac Enzymes:  Recent Labs Lab 08/21/12 0110 08/21/12 0510 08/21/12 1205  TROPONINI <0.30 <0.30 <0.30   BNP: BNP (last 3 results)  Recent Labs  08/23/12 0418 08/24/12 0350 08/27/12 0540  PROBNP 7669.0* 9342.0* 2085.0*   CBG: No results found for this basename: GLUCAP,  in the last 168 hours     Signed:  Marlin Canary  [facility] 08/27/2012, 1:58 PM

## 2012-08-27 NOTE — Progress Notes (Signed)
Pt's foley catheter dc'd @ 1000 and had not voided but a few dribbles. Straight cathed @ 1600 and drained 325 cc yellow clear urine. Discharge instructions gone over with pt and daughter who is an [employer] fundraiser. Also printed instructions given on how to straight cath self. Her daughter states she will probably straight cath her if needed. Home Health is supposed to help to teach how to self cath. Prescriptions given to daughter. Four  Self cath kits given to the pt to take with her.IV dc'd. Pt not using O2.

## 2012-08-31 ENCOUNTER — Ambulatory Visit: Payer: Medicare Other

## 2012-09-11 ENCOUNTER — Other Ambulatory Visit: Payer: Self-pay | Admitting: *Deleted

## 2012-09-11 ENCOUNTER — Telehealth: Payer: Self-pay | Admitting: Cardiology

## 2012-09-11 NOTE — Telephone Encounter (Signed)
Pt was seen in hospital by dr Swaziland, dtr said he told her to see him in 4-6 weeks and have an event monitor placed, need order and does he want them done same day or have monitor done prior? pls call dtr mary at [phone] (ok to leave a message) or [phone]

## 2012-09-11 NOTE — Telephone Encounter (Signed)
Spoke with [name], patient's daughter, to schedule followup appt for Dr. Swaziland s/p event monitor. Appt scheduled for 4/24. Informed patient/patient's daughter that they would receive phone call this week to be scheduled for monitor placement.

## 2012-09-20 ENCOUNTER — Telehealth: Payer: Self-pay | Admitting: *Deleted

## 2012-09-20 ENCOUNTER — Encounter (INDEPENDENT_AMBULATORY_CARE_PROVIDER_SITE_OTHER): Payer: Medicare Other

## 2012-09-20 NOTE — Telephone Encounter (Signed)
Event monitor placed on Pt 09/20/12 TK

## 2012-09-26 DIAGNOSIS — Z960 Presence of urogenital implants: Secondary | ICD-10-CM | POA: Insufficient documentation

## 2012-10-04 ENCOUNTER — Ambulatory Visit: Payer: Medicare Other | Admitting: Cardiology

## 2012-10-11 ENCOUNTER — Other Ambulatory Visit: Payer: Self-pay

## 2012-10-11 MED ORDER — AMIODARONE HCL 200 MG PO TABS: ORAL_TABLET | ORAL | Status: DC

## 2012-10-13 ENCOUNTER — Other Ambulatory Visit: Payer: Self-pay

## 2012-10-13 MED ORDER — AMIODARONE HCL 200 MG PO TABS: ORAL_TABLET | ORAL | Status: DC

## 2012-10-26 ENCOUNTER — Telehealth: Payer: Self-pay | Admitting: Cardiology

## 2012-10-26 ENCOUNTER — Ambulatory Visit (INDEPENDENT_AMBULATORY_CARE_PROVIDER_SITE_OTHER): Payer: Medicare Other | Admitting: Cardiology

## 2012-10-26 ENCOUNTER — Encounter: Payer: Self-pay | Admitting: Cardiology

## 2012-10-26 ENCOUNTER — Other Ambulatory Visit: Payer: Self-pay

## 2012-10-26 VITALS — BP 158/98 | HR 77 | Ht 62.0 in | Wt 156.4 lb

## 2012-10-26 DIAGNOSIS — I959 Hypotension, unspecified: Secondary | ICD-10-CM

## 2012-10-26 DIAGNOSIS — I4891 Unspecified atrial fibrillation: Secondary | ICD-10-CM

## 2012-10-26 MED ORDER — AMIODARONE HCL 200 MG PO TABS: 100.0000 mg | ORAL_TABLET | Freq: Every day | ORAL | Status: DC

## 2012-10-26 NOTE — Patient Instructions (Signed)
Restrict your salt  Reduce amiodarone to 100 mg daily ( half a tablet )  I will see you in 2 months.

## 2012-10-26 NOTE — Telephone Encounter (Signed)
Patient Instructions  Restrict your salt  Reduce amiodarone to 100 mg daily ( half a tablet )  I will see you in 2 months.  Previous Visit      Provider Department Encounter #    10/13/2012 12:58 PM Peter Swaziland, MD [facility] [id number]

## 2012-10-26 NOTE — Progress Notes (Signed)
Carla [name] Date of Birth: [birth year] Medical Record #[id number]  History of Present Illness: Carla [name] is seen today for followup. She was hospitalized in February with gram-negative sepsis related to urinary tract infection. She had acute renal insufficiency and dehydration. During that hospital stay she developed atrial fibrillation. She was placed on amiodarone and converted to sinus rhythm. Because of a recent upper GI bleed she was not anticoagulated. Since discharge she has done well. She denies any recurrent palpitations, dizziness, chest pain, or shortness of breath. She is now down to 200 mg daily on amiodarone. He reports she had lab work with her primary care recently. Echocardiogram during that hospital  stay showed mild right atrial right ventricular enlargement. There was moderate pulmonary hypertension. She had normal LV function.  Current Outpatient Prescriptions on File Prior to Visit  Medication Sig Dispense Refill  . acidophilus (RISAQUAD) CAPS Take 1 capsule by mouth daily.  30 capsule  0  . calcium-vitamin D (OSCAL WITH D) 500-200 MG-UNIT per tablet Take 1 tablet by mouth daily.      . cholecalciferol (VITAMIN D) 1000 UNITS tablet Take 1,000 Units by mouth daily.      Marland Kitchen conjugated estrogens (PREMARIN) vaginal cream Place 1 g vaginally 3 (three) times a week.      . ferrous sulfate 325 (65 FE) MG tablet Take 325 mg by mouth daily with breakfast.      . vitamin C (ASCORBIC ACID) 500 MG tablet Take 500 mg by mouth daily.       No current facility-administered medications on file prior to visit.    No Known Allergies  Past Medical History  Diagnosis Date  . Iron deficiency anemia   . High cholesterol   . Osteoarthritis   . Female bladder prolapse   . Osteoporosis   . Cataracts, bilateral   . Gallstones   . Hemorrhoids   . Diverticulosis   . Hiatal hernia   . Atrial fibrillation     Past Surgical History  Procedure Laterality Date  . Cholecystectomy     . Cataract extraction    . Esophagogastroduodenoscopy    . Colonoscopy      History  Smoking status  . Never Smoker   Smokeless tobacco  . Not on file    History  Alcohol Use No    Family History  Problem Relation Age of Onset  . Breast cancer Paternal Aunt   . Breast cancer Paternal Aunt   . Cancer      several family members on dad's side of family    Review of Systems: As noted in history of present illness.  All other systems were reviewed and are negative.  Physical Exam: BP 158/98  Pulse 77  Ht 5\' 2"  (1.575 m)  Wt 156 lb 6.4 oz (70.943 kg)  BMI 28.6 kg/m2  SpO2 96% She is a pleasant white female in no acute distress. Her HEENT is unremarkable. Lungs: Clear Cardiovascular: Regular rate and rhythm without gallop, murmur, or click. Abdomen: Soft and nontender. Bowel sounds positive. No masses or bruits. Extremities: Pulses are 2+ and symmetric. She has no edema. Neuro: Nonfocal. LABORATORY DATA: Recent event monitor showed normal sinus rhythm throughout.  Assessment / Plan: 1. Atrial fibrillation. I think this is most likely related to her acute sepsis. No evidence of recurrence on followup event monitor. I recommended reducing her amiodarone to 100 mg per daily. Will followup again in a couple of months and if she remains in sinus  rhythm we will stop her amiodarone completely. 2. Elevated troponin related to acute sepsis episode. Normal LV function. Patient has no anginal symptoms. I do not feel that ischemic evaluation is needed. 3. Hypertension.

## 2012-10-26 NOTE — Telephone Encounter (Signed)
New Prob      Requesting clarification on directions for AMIODARONE. Would like to speak to nurse.

## 2012-10-27 NOTE — Telephone Encounter (Signed)
Returned call to Lilia at [facility] 10/26/12,Dr.Jordan decreased amiodarone to 100 mg daily.

## 2012-11-02 ENCOUNTER — Telehealth: Payer: Self-pay

## 2012-11-02 NOTE — Telephone Encounter (Signed)
Patient called event monitor normal, no atrial fib.Advised to keep appointment with Dr.Jordan 01/23/13.

## 2012-11-28 ENCOUNTER — Encounter: Payer: Self-pay | Admitting: *Deleted

## 2012-11-30 ENCOUNTER — Encounter: Payer: Self-pay | Admitting: Nurse Practitioner

## 2012-11-30 ENCOUNTER — Ambulatory Visit (INDEPENDENT_AMBULATORY_CARE_PROVIDER_SITE_OTHER): Payer: Medicare Other | Admitting: Nurse Practitioner

## 2012-11-30 VITALS — BP 152/90 | HR 76 | Resp 12 | Ht 62.5 in | Wt 151.0 lb

## 2012-11-30 DIAGNOSIS — N813 Complete uterovaginal prolapse: Secondary | ICD-10-CM

## 2012-11-30 MED ORDER — ESTROGENS, CONJUGATED 0.625 MG/GM VA CREA: 1.0000 g | TOPICAL_CREAM | VAGINAL | Status: DC

## 2012-11-30 NOTE — Progress Notes (Signed)
Reviewed personally.  MSM 

## 2012-11-30 NOTE — Progress Notes (Signed)
79 y.o. Widowed White female [phone] here for pessary check.  Patient has been using following pessary style and size: 2 3/4" biotech short stem pessary.  She is not sexually active.  She describes the following issues with the pessary:  None.  She is seeing urologist  Dr. Benjamine Mola and is using a foley catheter as well with a leg bag. She had urodynamics this am and "failed" the test.  Still needs the catheter.  Even though the pessary may be causing some of the obstruction to the bladder neck they are willing to leave pessary in position to help with the significant uterine prolapse.  Her symptoms of renal failure occurred after new onset of hypotension,  Atrial fibrillation, with rapid ventricular response in February 2014. Daughter who is nurse is caring for patient at home.  ROS: no breast pain or new or enlarging lumps on self exam, no discharge or pelvic pain. She will be starting a prophylactic antibiotic since catheter is left in.  Exam:   BP 152/90  Pulse 76  Resp 12  Ht 5' 2.5" (1.588 m)  Wt 151 lb (68.493 kg)  BMI 27.16 kg/m2 General appearance: alert, cooperative and appears stated age Looks much better today! Inguinal adenopathy: none   Pelvic: External genitalia:  no lesions              Urethra: catheter in place and attached to a leg bag.              Bartholin's and Skene's: normal                 Vagina: cervical erosion at usual 4 - 6 o'clock position with only minimal spotting              Cervix: irritation as usual Bimanual Exam:  Uterus:  uterus is normal size, shape, consistency and non tender                               Adnexa:    not indicated and normal adnexa in size, non tender and no masses                               Anus:  defer exam  Pessary was removed with difficulty using a ring forceps to remove.  Pessary was cleansed and noted that it was starting to breakdown and multiple areas of discoloration.   Pessary was replaced. Patient tolerated procedure well.  New  pessary size 5 short stem Gellhorn from [employer] was ordered.  A:  Cystocele- symptomatic  Renal failure now improved  Use of foley catheter with leg bag since 09/2012  New onset hypotension, A Fib 08/2012       P:   Return to office in 2 months for recheck.  Will reinsert new pessary at that time  Strongly encouraged patient to use Premarin vaginal cream with more regularity to prevent further  cervical erosions.         An After Visit Summary was printed and given to the patient.

## 2012-11-30 NOTE — Patient Instructions (Addendum)
Try to insert vaginal estrogen cream 3 X a week. Recheck in 2 months.

## 2012-12-19 DIAGNOSIS — M1990 Unspecified osteoarthritis, unspecified site: Secondary | ICD-10-CM | POA: Insufficient documentation

## 2013-01-08 ENCOUNTER — Other Ambulatory Visit: Payer: Self-pay

## 2013-01-08 ENCOUNTER — Inpatient Hospital Stay (HOSPITAL_COMMUNITY)
Admission: EM | Admit: 2013-01-08 | Discharge: 2013-01-10 | DRG: 392 | Disposition: A | Discharge status: Home / Self Care | Payer: Medicare Other | Attending: Internal Medicine | Admitting: Internal Medicine

## 2013-01-08 ENCOUNTER — Emergency Department (HOSPITAL_COMMUNITY): Payer: Medicare Other

## 2013-01-08 ENCOUNTER — Encounter (HOSPITAL_COMMUNITY): Payer: Self-pay | Admitting: *Deleted

## 2013-01-08 DIAGNOSIS — D509 Iron deficiency anemia, unspecified: Secondary | ICD-10-CM

## 2013-01-08 DIAGNOSIS — A4150 Gram-negative sepsis, unspecified: Secondary | ICD-10-CM

## 2013-01-08 DIAGNOSIS — R748 Abnormal levels of other serum enzymes: Secondary | ICD-10-CM

## 2013-01-08 DIAGNOSIS — R7989 Other specified abnormal findings of blood chemistry: Secondary | ICD-10-CM

## 2013-01-08 DIAGNOSIS — N8110 Cystocele, unspecified: Secondary | ICD-10-CM

## 2013-01-08 DIAGNOSIS — D72829 Elevated white blood cell count, unspecified: Secondary | ICD-10-CM

## 2013-01-08 DIAGNOSIS — I4891 Unspecified atrial fibrillation: Secondary | ICD-10-CM

## 2013-01-08 DIAGNOSIS — M810 Age-related osteoporosis without current pathological fracture: Secondary | ICD-10-CM

## 2013-01-08 DIAGNOSIS — N179 Acute kidney failure, unspecified: Secondary | ICD-10-CM

## 2013-01-08 DIAGNOSIS — E860 Dehydration: Secondary | ICD-10-CM

## 2013-01-08 DIAGNOSIS — M4120 Other idiopathic scoliosis, site unspecified: Secondary | ICD-10-CM | POA: Diagnosis present

## 2013-01-08 DIAGNOSIS — K5790 Diverticulosis of intestine, part unspecified, without perforation or abscess without bleeding: Secondary | ICD-10-CM

## 2013-01-08 DIAGNOSIS — K449 Diaphragmatic hernia without obstruction or gangrene: Secondary | ICD-10-CM

## 2013-01-08 DIAGNOSIS — R112 Nausea with vomiting, unspecified: Secondary | ICD-10-CM

## 2013-01-08 DIAGNOSIS — K2960 Other gastritis without bleeding: Secondary | ICD-10-CM | POA: Diagnosis present

## 2013-01-08 DIAGNOSIS — N390 Urinary tract infection, site not specified: Secondary | ICD-10-CM

## 2013-01-08 DIAGNOSIS — R739 Hyperglycemia, unspecified: Secondary | ICD-10-CM | POA: Diagnosis present

## 2013-01-08 DIAGNOSIS — K649 Unspecified hemorrhoids: Secondary | ICD-10-CM

## 2013-01-08 DIAGNOSIS — E785 Hyperlipidemia, unspecified: Secondary | ICD-10-CM | POA: Diagnosis present

## 2013-01-08 DIAGNOSIS — R1115 Cyclical vomiting syndrome unrelated to migraine: Secondary | ICD-10-CM | POA: Diagnosis present

## 2013-01-08 DIAGNOSIS — I959 Hypotension, unspecified: Secondary | ICD-10-CM

## 2013-01-08 DIAGNOSIS — Z79899 Other long term (current) drug therapy: Secondary | ICD-10-CM

## 2013-01-08 DIAGNOSIS — K208 Other esophagitis without bleeding: Principal | ICD-10-CM | POA: Diagnosis present

## 2013-01-08 DIAGNOSIS — M1990 Unspecified osteoarthritis, unspecified site: Secondary | ICD-10-CM

## 2013-01-08 DIAGNOSIS — E7800 Pure hypercholesterolemia, unspecified: Secondary | ICD-10-CM

## 2013-01-08 DIAGNOSIS — R1110 Vomiting, unspecified: Secondary | ICD-10-CM

## 2013-01-08 DIAGNOSIS — R571 Hypovolemic shock: Secondary | ICD-10-CM

## 2013-01-08 DIAGNOSIS — R946 Abnormal results of thyroid function studies: Secondary | ICD-10-CM | POA: Diagnosis present

## 2013-01-08 LAB — URINE MICROSCOPIC-ADD ON

## 2013-01-08 LAB — URINALYSIS, ROUTINE W REFLEX MICROSCOPIC
Bilirubin Urine: NEGATIVE
Nitrite: NEGATIVE
Specific Gravity, Urine: 1.018 (ref 1.005–1.030)
Urobilinogen, UA: 0.2 mg/dL (ref 0.0–1.0)

## 2013-01-08 LAB — CBC
HCT: 49.5 % — ABNORMAL HIGH (ref 36.0–46.0)
Hemoglobin: 16 g/dL — ABNORMAL HIGH (ref 12.0–15.0)
MCH: 29.7 pg (ref 26.0–34.0)
MCHC: 32.3 g/dL (ref 30.0–36.0)
RDW: 16.3 % — ABNORMAL HIGH (ref 11.5–15.5)

## 2013-01-08 LAB — COMPREHENSIVE METABOLIC PANEL
ALT: 7 U/L (ref 0–35)
Alkaline Phosphatase: 86 U/L (ref 39–117)
BUN: 22 mg/dL (ref 6–23)
CO2: 34 mEq/L — ABNORMAL HIGH (ref 19–32)
GFR calc Af Amer: 38 mL/min — ABNORMAL LOW (ref >90)
GFR calc non Af Amer: 33 mL/min — ABNORMAL LOW (ref >90)
Glucose, Bld: 176 mg/dL — ABNORMAL HIGH (ref 70–99)
Potassium: 4.3 mEq/L (ref 3.5–5.1)
Sodium: 142 mEq/L (ref 135–145)
Total Bilirubin: 0.3 mg/dL (ref 0.3–1.2)
Total Protein: 8.1 g/dL (ref 6.0–8.3)

## 2013-01-08 LAB — CBC WITH DIFFERENTIAL/PLATELET
Basophils Absolute: 0 10*3/uL (ref 0.0–0.1)
Basophils Relative: 0 % (ref 0–1)
MCHC: 33.3 g/dL (ref 30.0–36.0)
Monocytes Absolute: 0.6 10*3/uL (ref 0.1–1.0)
Neutro Abs: 14.7 10*3/uL — ABNORMAL HIGH (ref 1.7–7.7)
Neutrophils Relative %: 89 % — ABNORMAL HIGH (ref 43–77)
Platelets: 250 10*3/uL (ref 150–400)
RDW: 16.1 % — ABNORMAL HIGH (ref 11.5–15.5)
WBC: 16.5 10*3/uL — ABNORMAL HIGH (ref 4.0–10.5)

## 2013-01-08 LAB — HEMOGLOBIN A1C
Hgb A1c MFr Bld: 6.1 % — ABNORMAL HIGH (ref <5.7)
Mean Plasma Glucose: 128 mg/dL — ABNORMAL HIGH (ref <117)

## 2013-01-08 LAB — LIPASE, BLOOD: Lipase: 75 U/L — ABNORMAL HIGH (ref 11–59)

## 2013-01-08 LAB — CREATININE, SERUM: Creatinine, Ser: 1.42 mg/dL — ABNORMAL HIGH (ref 0.50–1.10)

## 2013-01-08 MED ORDER — PROMETHAZINE HCL 25 MG/ML IJ SOLN
12.5000 mg | Freq: Once | INTRAMUSCULAR | Status: DC
  Filled 2013-01-08: qty 1

## 2013-01-08 MED ORDER — IOHEXOL 300 MG/ML  SOLN: 100.0000 mL | Freq: Once | INTRAMUSCULAR | Status: DC | PRN

## 2013-01-08 MED ORDER — IOHEXOL 300 MG/ML  SOLN
50.0000 mL | Freq: Once | INTRAMUSCULAR | Status: AC | PRN
  Administered 2013-01-08: 50 mL via ORAL

## 2013-01-08 MED ORDER — MORPHINE SULFATE 4 MG/ML IJ SOLN: 4.0000 mg | INTRAMUSCULAR | Status: DC | PRN

## 2013-01-08 MED ORDER — ONDANSETRON HCL 4 MG/2ML IJ SOLN: 4.0000 mg | Freq: Four times a day (QID) | INTRAMUSCULAR | Status: DC | PRN

## 2013-01-08 MED ORDER — SODIUM CHLORIDE 0.9 % IV SOLN
INTRAVENOUS | Status: DC
  Administered 2013-01-08 – 2013-01-09 (×2): via INTRAVENOUS

## 2013-01-08 MED ORDER — SODIUM CHLORIDE 0.9 % IV BOLUS (SEPSIS)
1000.0000 mL | Freq: Once | INTRAVENOUS | Status: AC
  Administered 2013-01-08: 1000 mL via INTRAVENOUS

## 2013-01-08 MED ORDER — PANTOPRAZOLE SODIUM 40 MG IV SOLR
40.0000 mg | Freq: Every day | INTRAVENOUS | Status: DC
  Administered 2013-01-08 – 2013-01-10 (×3): 40 mg via INTRAVENOUS
  Filled 2013-01-08 (×3): qty 40

## 2013-01-08 MED ORDER — MORPHINE SULFATE 4 MG/ML IJ SOLN
4.0000 mg | Freq: Once | INTRAMUSCULAR | Status: AC
  Administered 2013-01-08: 4 mg via INTRAVENOUS
  Filled 2013-01-08: qty 1

## 2013-01-08 MED ORDER — ESTROGENS, CONJUGATED 0.625 MG/GM VA CREA
1.0000 g | TOPICAL_CREAM | VAGINAL | Status: DC
  Administered 2013-01-10: 0.5 via VAGINAL
  Filled 2013-01-08: qty 42.5

## 2013-01-08 MED ORDER — SODIUM CHLORIDE 0.9 % IJ SOLN
3.0000 mL | Freq: Two times a day (BID) | INTRAMUSCULAR | Status: DC
  Administered 2013-01-08: 3 mL via INTRAVENOUS

## 2013-01-08 MED ORDER — METOCLOPRAMIDE HCL 5 MG/ML IJ SOLN
5.0000 mg | Freq: Four times a day (QID) | INTRAMUSCULAR | Status: DC
  Administered 2013-01-08 – 2013-01-10 (×7): 5 mg via INTRAVENOUS
  Filled 2013-01-08: qty 1
  Filled 2013-01-08: qty 2
  Filled 2013-01-08: qty 1
  Filled 2013-01-08: qty 2
  Filled 2013-01-08 (×5): qty 1
  Filled 2013-01-08: qty 2
  Filled 2013-01-08: qty 1

## 2013-01-08 MED ORDER — ENOXAPARIN SODIUM 30 MG/0.3ML ~~LOC~~ SOLN
30.0000 mg | Freq: Every day | SUBCUTANEOUS | Status: DC
  Administered 2013-01-08 – 2013-01-10 (×3): 30 mg via SUBCUTANEOUS
  Filled 2013-01-08 (×3): qty 0.3

## 2013-01-08 MED ORDER — DEXTROSE 5 % IV SOLN
50.0000 mg | Freq: Every day | INTRAVENOUS | Status: DC
  Administered 2013-01-08: 50 mg via INTRAVENOUS
  Filled 2013-01-08: qty 1

## 2013-01-08 MED ORDER — ACETAMINOPHEN 650 MG RE SUPP: 650.0000 mg | Freq: Four times a day (QID) | RECTAL | Status: DC | PRN

## 2013-01-08 MED ORDER — ONDANSETRON HCL 4 MG/2ML IJ SOLN
4.0000 mg | Freq: Once | INTRAMUSCULAR | Status: AC
  Administered 2013-01-08: 4 mg via INTRAVENOUS
  Filled 2013-01-08: qty 2

## 2013-01-08 MED ORDER — ONDANSETRON HCL 4 MG PO TABS: 4.0000 mg | ORAL_TABLET | Freq: Four times a day (QID) | ORAL | Status: DC | PRN

## 2013-01-08 MED ORDER — ACETAMINOPHEN 325 MG PO TABS: 650.0000 mg | ORAL_TABLET | Freq: Four times a day (QID) | ORAL | Status: DC | PRN

## 2013-01-08 NOTE — ED Notes (Addendum)
Per Dr. Patria Mane, please call CT in the next 10-15 minutes if pt cannot drink contrast due to emesis.  He wants to hold the phenergan due to pt's age.

## 2013-01-08 NOTE — ED Notes (Signed)
Pt still vomiting. Dr. Silverio Lay made aware.

## 2013-01-08 NOTE — Consult Note (Signed)
[facility] Reason for consult: nausea and vomiting Referring Physician: [facility]. Primary G.I.: Dr. John Hayes. PCP: Dr. Chan Badger  Carla [name] is an 79 y.o. female.  HPI: 87-year-old woman who had been doing well until yesterday when she had relatively sudden onset of severe nausea. She had a normal bowel movement earlier in the day with no diarrhea. She had no melena no hematemesis. She adamantly denies the usef NSAIDs. Minimal abdominal pain. There was question if this could be due to amiodarone, however patient has been on amiodarone at the same goes for some time. She feels much better with placement of NG tube. This is training thick gelatinous material not coffee ground material. Patient is post cholecystectomy. She is not had any real problems with reflux or ulcers. She has taken ranitidine PRN for reflux symptoms. She feels much better with the NG tube. Labs remarkable for UTI culture pending. Hemoglobin 17. Patient does need history that she is chronically iron deficient has been intermittently taking iron for 15 years. She underwent colonoscopy by Dr. Hayes 3 to 4 years ago with normal colonic biopsies in no particular findings. X-rays showed no son of bowel obstruction but did show a massive hiatal hernia with over half of the stomach and the chest with large air fluid level in the hernia sac.  Past Medical History  Diagnosis Date  . Iron deficiency anemia   . High cholesterol   . Osteoarthritis   . Female bladder prolapse   . Osteoporosis   . Cataracts, bilateral   . Gallstones   . Hemorrhoids   . Diverticulosis   . Hiatal hernia   . Atrial fibrillation   . Cystocele   . Rectocele   . Scoliosis   . Lordosis   . Hyperlipidemia   . Osteoarthritis     Past Surgical History  Procedure Laterality Date  . Cholecystectomy    . Cataract extraction    . Esophagogastroduodenoscopy    . Colonoscopy    . Endometrial biopsy  09/2009    Family  History  Problem Relation Age of Onset  . Breast cancer Paternal Aunt   . Breast cancer Paternal Aunt   . Cancer      several family members on dad's side of family  . Diabetes Father   . Leukemia Father   . Heart failure Brother     Pt cannot confirm this history    Social History:  reports that she has never smoked. She has never used smokeless tobacco. She reports that she does not drink alcohol or use illicit drugs.  Allergies: No Known Allergies  Medications; . conjugated estrogens  1 g Vaginal 3 times weekly  . enoxaparin (LOVENOX) injection  30 mg Subcutaneous Daily  . metoCLOPramide (REGLAN) injection  5 mg Intravenous Q6H  . pantoprazole (PROTONIX) IV  40 mg Intravenous Daily  . sodium chloride  3 mL Intravenous Q12H   PRN Meds acetaminophen, acetaminophen, morphine injection, ondansetron (ZOFRAN) IV, ondansetron Results for orders placed during the hospital encounter of 01/08/13 (from the past 48 hour(s))  CBC WITH DIFFERENTIAL     Status: Abnormal   Collection Time    01/08/13  6:29 AM      Result Value Range   WBC 16.5 (*) 4.0 - 10.5 K/uL   RBC 5.70 (*) 3.87 - 5.11 MIL/uL   Hemoglobin 17.2 (*) 12.0 - 15.0 g/dL   HCT 51.6 (*) 36.0 - 46.0 %   MCV 90.5  78.0 -   100.0 fL   MCH 30.2  26.0 - 34.0 pg   MCHC 33.3  30.0 - 36.0 g/dL   RDW 16.1 (*) 11.5 - 15.5 %   Platelets 250  150 - 400 K/uL   Neutrophils Relative % 89 (*) 43 - 77 %   Neutro Abs 14.7 (*) 1.7 - 7.7 K/uL   Lymphocytes Relative 7 (*) 12 - 46 %   Lymphs Abs 1.1  0.7 - 4.0 K/uL   Monocytes Relative 4  3 - 12 %   Monocytes Absolute 0.6  0.1 - 1.0 K/uL   Eosinophils Relative 0  0 - 5 %   Eosinophils Absolute 0.0  0.0 - 0.7 K/uL   Basophils Relative 0  0 - 1 %   Basophils Absolute 0.0  0.0 - 0.1 K/uL  POCT I-STAT TROPONIN I     Status: None   Collection Time    01/08/13  6:54 AM      Result Value Range   Troponin i, poc 0.00  0.00 - 0.08 ng/mL   Comment 3            Comment: Due to the release  kinetics of cTnI,     a negative result within the first hours     of the onset of symptoms does not rule out     myocardial infarction with certainty.     If myocardial infarction is still suspected,     repeat the test at appropriate intervals.  COMPREHENSIVE METABOLIC PANEL     Status: Abnormal   Collection Time    01/08/13  7:05 AM      Result Value Range   Sodium 142  135 - 145 mEq/L   Potassium 4.3  3.5 - 5.1 mEq/L   Chloride 99  96 - 112 mEq/L   CO2 34 (*) 19 - 32 mEq/L   Glucose, Bld 176 (*) 70 - 99 mg/dL   BUN 22  6 - 23 mg/dL   Creatinine, Ser 1.39 (*) 0.50 - 1.10 mg/dL   Calcium 9.6  8.4 - 10.5 mg/dL   Total Protein 8.1  6.0 - 8.3 g/dL   Albumin 3.3 (*) 3.5 - 5.2 g/dL   AST 14  0 - 37 U/L   ALT 7  0 - 35 U/L   Alkaline Phosphatase 86  39 - 117 U/L   Total Bilirubin 0.3  0.3 - 1.2 mg/dL   GFR calc non Af Amer 33 (*) >90 mL/min   GFR calc Af Amer 38 (*) >90 mL/min   Comment:            The eGFR has been calculated     using the CKD EPI equation.     This calculation has not been     validated in all clinical     situations.     eGFR's persistently     <90 mL/min signify     possible Chronic Kidney Disease.  LIPASE, BLOOD     Status: Abnormal   Collection Time    01/08/13  7:05 AM      Result Value Range   Lipase 75 (*) 11 - 59 U/L  URINALYSIS, ROUTINE W REFLEX MICROSCOPIC     Status: Abnormal   Collection Time    01/08/13  7:59 AM      Result Value Range   Color, Urine YELLOW  YELLOW   APPearance CLOUDY (*) CLEAR   Specific Gravity, Urine 1.018  1.005 - 1.030     pH 8.0  5.0 - 8.0   Glucose, UA NEGATIVE  NEGATIVE mg/dL   Hgb urine dipstick LARGE (*) NEGATIVE   Bilirubin Urine NEGATIVE  NEGATIVE   Ketones, ur NEGATIVE  NEGATIVE mg/dL   Protein, ur 100 (*) NEGATIVE mg/dL   Urobilinogen, UA 0.2  0.0 - 1.0 mg/dL   Nitrite NEGATIVE  NEGATIVE   Leukocytes, UA LARGE (*) NEGATIVE  URINE MICROSCOPIC-ADD ON     Status: Abnormal   Collection Time    01/08/13   7:59 AM      Result Value Range   Squamous Epithelial / LPF RARE  RARE   WBC, UA TOO NUMEROUS TO COUNT  <3 WBC/hpf   RBC / HPF 11-20  <3 RBC/hpf   Bacteria, UA MANY (*) RARE  CBC     Status: Abnormal   Collection Time    01/08/13  1:21 PM      Result Value Range   WBC 18.6 (*) 4.0 - 10.5 K/uL   RBC 5.39 (*) 3.87 - 5.11 MIL/uL   Hemoglobin 16.0 (*) 12.0 - 15.0 g/dL   HCT 49.5 (*) 36.0 - 46.0 %   MCV 91.8  78.0 - 100.0 fL   MCH 29.7  26.0 - 34.0 pg   MCHC 32.3  30.0 - 36.0 g/dL   RDW 16.3 (*) 11.5 - 15.5 %   Platelets 278  150 - 400 K/uL  CREATININE, SERUM     Status: Abnormal   Collection Time    01/08/13  1:21 PM      Result Value Range   Creatinine, Ser 1.42 (*) 0.50 - 1.10 mg/dL   GFR calc non Af Amer 32 (*) >90 mL/min   GFR calc Af Amer 37 (*) >90 mL/min   Comment:            The eGFR has been calculated     using the CKD EPI equation.     This calculation has not been     validated in all clinical     situations.     eGFR's persistently     <90 mL/min signify     possible Chronic Kidney Disease.    Ct Abdomen Pelvis Wo Contrast  01/08/2013   *RADIOLOGY REPORT*  Clinical Data: Periumbilical pain.  Vomiting.  CT ABDOMEN AND PELVIS WITHOUT CONTRAST  Technique:  Multidetector CT imaging of the abdomen and pelvis was performed following the standard protocol without intravenous contrast.  Comparison: 08/19/2012  Findings: As seen on the previous study, there is a large hiatal hernia containing at least 50% of the stomach.  The stomach is dilated and fluid-filled, similar to the appearance on the previous exam.  No focal abnormalities seen in the liver on this study performed without intravenous contrast material.  Calcified granulomata are noted in the spleen.  The duodenum is unremarkable.  Pancreas is fatty replaced.  Gallbladder is surgically absent.  No adrenal mass.  Stable appearance of 3.3 cm left renal cyst.  Right kidney is unremarkable.  Atherosclerotic calcification  noted in the abdominal aorta without aneurysm.  No free fluid or lymphadenopathy in the abdomen.  Imaging through the pelvis shows no free intraperitoneal fluid.  A pessary is noted in the vagina.  Air in the urinary bladder is presumably secondary to recent instrumentation.  Extensive diverticular change noted in the sigmoid colon without overt features of diverticulitis.  No evidence for bowel obstruction. Terminal ileum is normal.  The appendix is normal.  Bones are diffusely demineralized   with lumbar scoliosis again noted.  IMPRESSION: Overall stable exam.  Large hiatal hernia with approximately one half of the stomach position in the lower chest.  The stomach is distended and fluid- filled, but the features are essentially identical to the study from 5 months ago.  No fluid or edema around the stomach at this time.  Advanced diverticular changes in the colon without evidence of diverticulitis.  No free fluid or lymphadenopathy.  Status post cholecystectomy.   Original Report Authenticated By: Eric Mansell, M.D.   Dg Abd Acute W/chest  01/08/2013   *RADIOLOGY REPORT*  Clinical Data: Nausea and vomiting since last night after eating.  ACUTE ABDOMEN SERIES (ABDOMEN 2 VIEW & CHEST 1 VIEW)  Comparison: Chest 08/27/2012  Findings: Shallow inspiration with elevation right hemidiaphragm. Heart size and pulmonary vascularity are normal.  No focal airspace consolidation in the lungs.  No blunting of costophrenic angles. No pneumothorax.  Calcification of the aorta.  Large esophageal hiatal hernia behind the heart.  Gas and stool throughout the colon.  No small or large bowel distension.  No free intra-abdominal air.  No abnormal air fluid levels.  No radiopaque stones.  Scoliosis and degenerative changes in the spine.  Vascular calcifications.  Shadow over the pelvis consistent with a pessary.  IMPRESSION: Large esophageal hiatal hernia behind the heart.  No evidence of active pulmonary disease.  Nonobstructive bowel  gas pattern.   Original Report Authenticated By: William Stevens, M.D.               Blood pressure 128/70, pulse 81, temperature 97.7 F (36.5 C), temperature source Oral, resp. rate 18, height 5' 3" (1.6 m), weight 69.4 kg (153 lb), SpO2 97.00%.  Physical exam:   General-- pleasant white female in no distress, NG tube draining large amount of thick gelatinous material Heart-- normal Lungs--clear Abdomen-- none distended soft and nontender   Assessment: 1. Nausea and vomiting -- this could be anything from an acute viral illness to some reaction to amiodarone. The x-ray and CT findings to suggest an outlet obstruction without clear SBO. I think she should have endoscopy to evaluate dysuria endoscopically after the large amount of liquid in the stomach has been adequately removed  Plan: we will continue NG suction overnight and will plan EGD and the a.m. to evaluate the gastric outlet for possible obstruction, ulceration etc. Have discussed this with the patient and family  Karrine Kluttz JR,Cici Rodriges L 01/08/2013, 5:20 PM      

## 2013-01-08 NOTE — ED Notes (Signed)
Patient transported to CT 

## 2013-01-08 NOTE — Progress Notes (Signed)
Miscellaneous Pharmacy Consult: Conversion of PO amiodarone to IV  Patient takes amiodarone 100mg  PO daily at home for history of Afib. She is now admitted with intractable nausea and vomiting, NGT was placed and is now NPO. Pharmacy is asked to convert amiodarone to the equivalent dosage IV.  Per Up To Date, long-term amiodarone therapy (ie, >4 months), the mean plasma-elimination half-life of the active metabolite of amiodarone is 61 days. Replacement therapy may not be necessary in such patients if oral therapy is discontinued for a period <2 weeks, since any changes in serum amiodarone concentrations during this period may not be clinically significant.   The oral bioavailability of amiodarone ranges from 35-65%, therefore, if IV is needed, would use ~1/2 of oral dosage.  Plan:  Change to amiodarone 50mg  IV q24h  Follow up for conversion back to PO  Loralee Pacas, PharmD, BCPS Pager: [phone] 01/08/2013 2:31 PM

## 2013-01-08 NOTE — ED Provider Notes (Signed)
Visual need to be admitted for persistent nausea and ongoing hiatal hernia with distended stomach.  NG will be placed for patient comfort and to decompress the stomach.  Patient will likely need outpatient evaluation for hiatal hernia surgery repair however at age 79 this is with significant risk.  Symptomatic control this time.  1. Vomiting   2. Hiatal hernia     Ct Abdomen Pelvis Wo Contrast  01/08/2013   *RADIOLOGY REPORT*  Clinical Data: Periumbilical pain.  Vomiting.  CT ABDOMEN AND PELVIS WITHOUT CONTRAST  Technique:  Multidetector CT imaging of the abdomen and pelvis was performed following the standard protocol without intravenous contrast.  Comparison: 08/19/2012  Findings: As seen on the previous study, there is a large hiatal hernia containing at least 50% of the stomach.  The stomach is dilated and fluid-filled, similar to the appearance on the previous exam.  No focal abnormalities seen in the liver on this study performed without intravenous contrast material.  Calcified granulomata are noted in the spleen.  The duodenum is unremarkable.  Pancreas is fatty replaced.  Gallbladder is surgically absent.  No adrenal mass.  Stable appearance of 3.3 cm left renal cyst.  Right kidney is unremarkable.  Atherosclerotic calcification noted in the abdominal aorta without aneurysm.  No free fluid or lymphadenopathy in the abdomen.  Imaging through the pelvis shows no free intraperitoneal fluid.  A pessary is noted in the vagina.  Air in the urinary bladder is presumably secondary to recent instrumentation.  Extensive diverticular change noted in the sigmoid colon without overt features of diverticulitis.  No evidence for bowel obstruction. Terminal ileum is normal.  The appendix is normal.  Bones are diffusely demineralized with lumbar scoliosis again noted.  IMPRESSION: Overall stable exam.  Large hiatal hernia with approximately one half of the stomach position in the lower chest.  The stomach is distended  and fluid- filled, but the features are essentially identical to the study from 5 months ago.  No fluid or edema around the stomach at this time.  Advanced diverticular changes in the colon without evidence of diverticulitis.  No free fluid or lymphadenopathy.  Status post cholecystectomy.   Original Report Authenticated By: Kennith Center, M.D.   Dg Abd Acute W/chest  01/08/2013   *RADIOLOGY REPORT*  Clinical Data: Nausea and vomiting since last night after eating.  ACUTE ABDOMEN SERIES (ABDOMEN 2 VIEW & CHEST 1 VIEW)  Comparison: Chest 08/27/2012  Findings: Shallow inspiration with elevation right hemidiaphragm. Heart size and pulmonary vascularity are normal.  No focal airspace consolidation in the lungs.  No blunting of costophrenic angles. No pneumothorax.  Calcification of the aorta.  Large esophageal hiatal hernia behind the heart.  Gas and stool throughout the colon.  No small or large bowel distension.  No free intra-abdominal air.  No abnormal air fluid levels.  No radiopaque stones.  Scoliosis and degenerative changes in the spine.  Vascular calcifications.  Shadow over the pelvis consistent with a pessary.  IMPRESSION: Large esophageal hiatal hernia behind the heart.  No evidence of active pulmonary disease.  Nonobstructive bowel gas pattern.   Original Report Authenticated By: Burman Nieves, M.D.  I personally reviewed the imaging tests through PACS system I reviewed available ER/hospitalization records through the EMR   Lyanne Co, MD 01/08/13 [phone]

## 2013-01-08 NOTE — H&P (Addendum)
[facility] History and Physical  Carla [name]:[id number] DOB: 1922/04/02 DOA: 01/08/2013  Referring physician: Dr. Azalia Bilis PCP: [name], MD   Chief Complaint: Nausea and vomiting   History of Present Illness: Carla [name] is an 79 y.o. female with a PMH of atrial fibrillation, dyslipidemia, hiatal hernia who presents with a 24 hour history of the sudden onset of nausea and vomiting.  Multiple episodes through the night, with 9 episodes noted by ED staff.  No hematemesis, described as brown in color.  Had a similar episode in 2/14, which seemed to be triggered by by a UTI.  No associated diarrhea.  No associated fever or chills.  No sick contacts.  Did not take any OTC or prescription anti-emetics.  Initially thought meal intake triggered symptoms, but had persistent vomiting through the night.  The patient underwent a CT scan in the ER which showed a large hiatal hernia, but it appeared to be stable from prior scans.  She had a colonoscopy about 2 years ago, done by Dr. Madilyn Fireman, but has not had any ongoing GI care.  Review of Systems: Constitutional: No fever, no chills;  Appetite normal; No weight loss, no weight gain, no fatigue.  HEENT: No blurry vision, no diplopia, no pharyngitis, no dysphagia CV: No chest pain, no palpitations, no PND.  Resp: No SOB, no cough. GI: + nausea, + vomiting, no diarrhea, no melena, no hematochezia.  GU: No dysuria, no hematuria. MSK: no myalgias, no arthralgias.  Neuro:  No headache, no focal neurological deficits, no history of seizures.  Psych: No depression, no anxiety.  Endo: No heat intolerance, no cold intolerance, no polyuria, no polydipsia  Skin: No rashes, no skin lesions.  Heme: + easy bruising.  Past Medical History Past Medical History  Diagnosis Date  . Iron deficiency anemia   . High cholesterol   . Osteoarthritis   . Female bladder prolapse   . Osteoporosis   . Cataracts, bilateral   . Gallstones   .  Hemorrhoids   . Diverticulosis   . Hiatal hernia   . Atrial fibrillation   . Cystocele   . Rectocele   . Scoliosis   . Lordosis   . Hyperlipidemia   . Osteoarthritis      Past Surgical History Past Surgical History  Procedure Laterality Date  . Cholecystectomy    . Cataract extraction    . Esophagogastroduodenoscopy    . Colonoscopy    . Endometrial biopsy  09/2009     Social History: History   Social History  . Marital Status: Widowed    Spouse Name: N/A    Number of Children: 4  . Years of Education: N/A   Occupational History  . Retired Chartered loss adjuster    Social History Main Topics  . Smoking status: Never Smoker   . Smokeless tobacco: Never Used  . Alcohol Use: No  . Drug Use: No  . Sexually Active: No   Other Topics Concern  . Not on file   Social History Narrative   Widowed.  Lives alone.  Uses a cane/walker to ambulate but independent of ADLs.    Family History:  Family History  Problem Relation Age of Onset  . Breast cancer Paternal Aunt   . Breast cancer Paternal Aunt   . Cancer      several family members on dad's side of family  . Diabetes Father   . Leukemia Father   . Heart failure Brother     Pt  cannot confirm this history    Allergies: Review of patient's allergies indicates no known allergies.  Meds: Prior to Admission medications   Medication Sig Start Date End Date Taking? Authorizing Provider  acetaminophen (TYLENOL) 500 MG tablet Take 500 mg by mouth every 6 (six) hours as needed for pain.   Yes Historical Provider, MD  amiodarone (PACERONE) 200 MG tablet Take 0.5 tablets (100 mg total) by mouth daily. 10/26/12  Yes Peter M [place], MD  calcium-vitamin D (OSCAL WITH D) 500-200 MG-UNIT per tablet Take 1 tablet by mouth daily.   Yes Historical Provider, MD  cholecalciferol (VITAMIN D) 1000 UNITS tablet Take 1,000 Units by mouth daily.   Yes Historical Provider, MD  conjugated estrogens (PREMARIN) vaginal cream Place 0.5  Applicatorfuls vaginally 3 (three) times a week. LOT: [id number]   EXP: 08/2013 11/30/12  Yes Elease Hashimoto Rolen-Grubb, FNP  docusate sodium (COLACE) 100 MG capsule Take 100 mg by mouth 2 (two) times daily as needed for constipation (constipation).   Yes Historical Provider, MD  Nutritional Supplements (OSTEO ADVANCE) TABS Take 2 tablets by mouth daily.   Yes Historical Provider, MD  ranitidine (ZANTAC) 150 MG tablet Take 150 mg by mouth 2 (two) times daily as needed for heartburn (as needed   heartburn).   Yes Historical Provider, MD  vitamin C (ASCORBIC ACID) 500 MG tablet Take 500 mg by mouth daily.   Yes Historical Provider, MD  Vitamin D, Ergocalciferol, (DRISDOL) 50000 UNITS CAPS Take 50,000 Units by mouth.   Yes Historical Provider, MD    Physical Exam: Filed Vitals:   01/08/13 0715 01/08/13 0745 01/08/13 0815 01/08/13 0900  BP:      Pulse: 89 87 92 91  Temp:      TempSrc:      Resp: 26 15 20 17   Height:      Weight:      SpO2: 91% 91% 88% 91%     Physical Exam: Blood pressure 177/84, pulse 91, temperature 98.3 F (36.8 C), temperature source Oral, resp. rate 17, height 5\' 3"  (1.6 m), weight 69.4 kg (153 lb), SpO2 91.00%. Gen: No acute distress. Head: Normocephalic, atraumatic. Eyes: PERRL, EOMI, sclerae nonicteric. Mouth: Oropharynx clear.  Tongue midline, palate rises symmetrically.  Mucous membranes moist. Neck: Supple, no thyromegaly, no lymphadenopathy, no jugular venous distention. Chest: Lungs CTAB. CV: Heart sounds are regular with a few pre-mature beats. Abdomen: Soft, nontender, nondistended with normal active bowel sounds. Extremities: Extremities are with 1+ edema. Skin: Warm and dry. Neuro: Alert and oriented times 3; cranial nerves II through XII grossly intact. Psych: Mood and affect normal.  Labs on Admission:  Basic Metabolic Panel:  Recent Labs Lab 01/08/13 0705  NA 142  K 4.3  CL 99  CO2 34*  GLUCOSE 176*  BUN 22  CREATININE 1.39*  CALCIUM 9.6    Liver Function Tests:  Recent Labs Lab 01/08/13 0705  AST 14  ALT 7  ALKPHOS 86  BILITOT 0.3  PROT 8.1  ALBUMIN 3.3*    Recent Labs Lab 01/08/13 0705  LIPASE 75*   CBC:  Recent Labs Lab 01/08/13 0629  WBC 16.5*  NEUTROABS 14.7*  HGB 17.2*  HCT 51.6*  MCV 90.5  PLT 250    BNP (last 3 results)  Recent Labs  08/23/12 0418 08/24/12 0350 08/27/12 0540  PROBNP 7669.0* 9342.0* 2085.0*    Radiological Exams on Admission: Ct Abdomen Pelvis Wo Contrast  01/08/2013   *RADIOLOGY REPORT*  Clinical Data: Periumbilical pain.  Vomiting.  CT ABDOMEN AND PELVIS WITHOUT CONTRAST  Technique:  Multidetector CT imaging of the abdomen and pelvis was performed following the standard protocol without intravenous contrast.  Comparison: 08/19/2012  Findings: As seen on the previous study, there is a large hiatal hernia containing at least 50% of the stomach.  The stomach is dilated and fluid-filled, similar to the appearance on the previous exam.  No focal abnormalities seen in the liver on this study performed without intravenous contrast material.  Calcified granulomata are noted in the spleen.  The duodenum is unremarkable.  Pancreas is fatty replaced.  Gallbladder is surgically absent.  No adrenal mass.  Stable appearance of 3.3 cm left renal cyst.  Right kidney is unremarkable.  Atherosclerotic calcification noted in the abdominal aorta without aneurysm.  No free fluid or lymphadenopathy in the abdomen.  Imaging through the pelvis shows no free intraperitoneal fluid.  A pessary is noted in the vagina.  Air in the urinary bladder is presumably secondary to recent instrumentation.  Extensive diverticular change noted in the sigmoid colon without overt features of diverticulitis.  No evidence for bowel obstruction. Terminal ileum is normal.  The appendix is normal.  Bones are diffusely demineralized with lumbar scoliosis again noted.  IMPRESSION: Overall stable exam.  Large hiatal hernia with  approximately one half of the stomach position in the lower chest.  The stomach is distended and fluid- filled, but the features are essentially identical to the study from 5 months ago.  No fluid or edema around the stomach at this time.  Advanced diverticular changes in the colon without evidence of diverticulitis.  No free fluid or lymphadenopathy.  Status post cholecystectomy.   Original Report Authenticated By: Kennith Center, M.D.   Dg Abd Acute W/chest  01/08/2013   *RADIOLOGY REPORT*  Clinical Data: Nausea and vomiting since last night after eating.  ACUTE ABDOMEN SERIES (ABDOMEN 2 VIEW & CHEST 1 VIEW)  Comparison: Chest 08/27/2012  Findings: Shallow inspiration with elevation right hemidiaphragm. Heart size and pulmonary vascularity are normal.  No focal airspace consolidation in the lungs.  No blunting of costophrenic angles. No pneumothorax.  Calcification of the aorta.  Large esophageal hiatal hernia behind the heart.  Gas and stool throughout the colon.  No small or large bowel distension.  No free intra-abdominal air.  No abnormal air fluid levels.  No radiopaque stones.  Scoliosis and degenerative changes in the spine.  Vascular calcifications.  Shadow over the pelvis consistent with a pessary.  IMPRESSION: Large esophageal hiatal hernia behind the heart.  No evidence of active pulmonary disease.  Nonobstructive bowel gas pattern.   Original Report Authenticated By: Burman Nieves, M.D.    EKG: Independently reviewed. NSR at 94 beats per minute.  Right axis deviation.  Assessment/Plan Principal Problem:   Intractable nausea and vomiting / Elevated lipase level -No evidence of gastric outlet obstruction, but CT showed a large esophageal hiatal hernia.  LFTs WNL. -NG tube placed with subsequent resolution of N/V.  Had not responded to anti-emetics. -Supportive care with IVF, empiric PPI and Reglan.  NPO. -GI consultation requested ([facility]). -Check TSH. Active Problems:    Hyperglycemia -Check hemoglobin A1c.   Dehydration -Maintain on IVF, NPO.   Atrial fibrillation -Currently in NSR.  Give Amiodarone IV.   Acute kidney injury -Likely from dehydration.  Hydrate and monitor.  Code Status: Full, confirmed with the patient. Family Communication: [name] or Wilfrid Lund (daughter/son-in-law).  [phone]. Disposition Plan: Home when stable.  Time spent: 70 minutes.  Trevone Prestwood [facility] Pager [phone]  If 7PM-7AM, please contact night-coverage [email or web address] Password [facility] 01/08/2013, 12:35 PM

## 2013-01-08 NOTE — ED Notes (Signed)
Pt c/o vomiting since 1800 yesterday, so many times she is unable to count. Pt denies diarrhea. Pt c/o periumbilical abdominal pain.

## 2013-01-08 NOTE — ED Provider Notes (Signed)
History    CSN: [id number] Arrival date & time 01/08/13  0541  First MD Initiated Contact with Patient 01/08/13 [phone]     Chief Complaint  Patient presents with  . Emesis   (Consider location/radiation/quality/duration/timing/severity/associated sxs/prior Treatment) The history is provided by the patient.  Carla [name] is a 79 y.o. female hx of HL, cholecystectomy, diverticulitis, here with abdominal pain and vomiting. She does complain of periumbilical pain around 6 PM yesterday. She described the pain as a band like feeling across her mid abdomen. She then ate some roast beef and then had many episodes of vomiting. Denies any chest pressure shortness of breath. She came here several months ago for the same complaint was diagnosed with diverticulitis as well as UTI and aspiration pneumonia. No fevers or chills.   Past Medical History  Diagnosis Date  . Iron deficiency anemia   . High cholesterol   . Osteoarthritis   . Female bladder prolapse   . Osteoporosis   . Cataracts, bilateral   . Gallstones   . Hemorrhoids   . Diverticulosis   . Hiatal hernia   . Atrial fibrillation   . Cystocele   . Rectocele   . Scoliosis   . Lordosis   . Hyperlipidemia   . Osteoarthritis    Past Surgical History  Procedure Laterality Date  . Cholecystectomy    . Cataract extraction    . Esophagogastroduodenoscopy    . Colonoscopy    . Endometrial biopsy  09/2009   Family History  Problem Relation Age of Onset  . Breast cancer Paternal Aunt   . Breast cancer Paternal Aunt   . Cancer      several family members on dad's side of family  . Diabetes Father   . Leukemia Father   . Heart failure Brother    History  Substance Use Topics  . Smoking status: Never Smoker   . Smokeless tobacco: Not on file  . Alcohol Use: No   OB History   Grav Para Term Preterm Abortions TAB SAB Ect Mult Living   4 4        4      Review of Systems  Gastrointestinal: Positive for nausea, vomiting  and abdominal pain.  All other systems reviewed and are negative.    Allergies  Review of patient's allergies indicates no known allergies.  Home Medications   Current Outpatient Rx  Name  Route  Sig  Dispense  Refill  . acetaminophen (TYLENOL) 500 MG tablet   Oral   Take 500 mg by mouth every 6 (six) hours as needed for pain.         Marland Kitchen amiodarone (PACERONE) 200 MG tablet   Oral   Take 0.5 tablets (100 mg total) by mouth daily.   30 tablet   11   . calcium-vitamin D (OSCAL WITH D) 500-200 MG-UNIT per tablet   Oral   Take 1 tablet by mouth daily.         . cholecalciferol (VITAMIN D) 1000 UNITS tablet   Oral   Take 1,000 Units by mouth daily.         Marland Kitchen conjugated estrogens (PREMARIN) vaginal cream   Vaginal   Place 0.5 Applicatorfuls vaginally 3 (three) times a week. LOT: [id number]   EXP: 08/2013   42.5 g   0   . docusate sodium (COLACE) 100 MG capsule   Oral   Take 100 mg by mouth 2 (two) times daily as needed for constipation (  constipation).         . Nutritional Supplements (OSTEO ADVANCE) TABS   Oral   Take 2 tablets by mouth daily.         . ranitidine (ZANTAC) 150 MG tablet   Oral   Take 150 mg by mouth 2 (two) times daily as needed for heartburn (as needed   heartburn).         . vitamin C (ASCORBIC ACID) 500 MG tablet   Oral   Take 500 mg by mouth daily.         . Vitamin D, Ergocalciferol, (DRISDOL) 50000 UNITS CAPS   Oral   Take 50,000 Units by mouth.          BP 177/84  Pulse 100  Temp(Src) 98.3 F (36.8 C) (Oral)  Resp 18  Ht 5\' 3"  (1.6 m)  Wt 153 lb (69.4 kg)  BMI 27.11 kg/m2  SpO2 93% Physical Exam  Nursing note and vitals reviewed. Constitutional: She is oriented to person, place, and time.  Chronically ill, vomiting   HENT:  Head: Normocephalic.  Mm dry   Eyes: Conjunctivae are normal. Pupils are equal, round, and reactive to light.  Neck: Normal range of motion. Neck supple.  Cardiovascular: Regular rhythm and  normal heart sounds.   Borderline tachy  Pulmonary/Chest: Effort normal and breath sounds normal. No respiratory distress. She has no wheezes. She has no rales.  Abdominal:  Soft, mild periumbilical tenderness   Musculoskeletal: Normal range of motion.  Neurological: She is alert and oriented to person, place, and time.  Skin: Skin is warm and dry.  Psychiatric: She has a normal mood and affect. Her behavior is normal. Judgment and thought content normal.    ED Course  Procedures (including critical care time) Labs Reviewed  CBC WITH DIFFERENTIAL - Abnormal; Notable for the following:    WBC 16.5 (*)    RBC 5.70 (*)    Hemoglobin 17.2 (*)    HCT 51.6 (*)    RDW 16.1 (*)    Neutrophils Relative % 89 (*)    Neutro Abs 14.7 (*)    Lymphocytes Relative 7 (*)    All other components within normal limits  URINALYSIS, ROUTINE W REFLEX MICROSCOPIC  COMPREHENSIVE METABOLIC PANEL  LIPASE, BLOOD   Dg Abd Acute W/chest  01/08/2013   *RADIOLOGY REPORT*  Clinical Data: Nausea and vomiting since last night after eating.  ACUTE ABDOMEN SERIES (ABDOMEN 2 VIEW & CHEST 1 VIEW)  Comparison: Chest 08/27/2012  Findings: Shallow inspiration with elevation right hemidiaphragm. Heart size and pulmonary vascularity are normal.  No focal airspace consolidation in the lungs.  No blunting of costophrenic angles. No pneumothorax.  Calcification of the aorta.  Large esophageal hiatal hernia behind the heart.  Gas and stool throughout the colon.  No small or large bowel distension.  No free intra-abdominal air.  No abnormal air fluid levels.  No radiopaque stones.  Scoliosis and degenerative changes in the spine.  Vascular calcifications.  Shadow over the pelvis consistent with a pessary.  IMPRESSION: Large esophageal hiatal hernia behind the heart.  No evidence of active pulmonary disease.  Nonobstructive bowel gas pattern.   Original Report Authenticated By: Burman Nieves, M.D.   No diagnosis found.   Date:  01/08/2013  Rate: 94  Rhythm: normal sinus rhythm  QRS Axis: right  Intervals: normal  ST/T Wave abnormalities: nonspecific ST changes  Conduction Disutrbances:none  Narrative Interpretation:   Old EKG Reviewed: unchanged  MDM  Carla [name] is a 78 y.o. female here with abdominal pain, vomiting. Will need to r/o SBO vs diverticulitis vs appendicitis. Will get labs and CT. I doubt ACS but she had demand ischemia previously so will check trop. Will hydrate and reassess.   7 AM WBC elevated at 16. Xray showed large hiatal hernia. CT pending. I signed out to Dr. Patria Mane to follow up labs and CT and reassess patient.     Richardean Canal, MD 01/08/13 [phone]

## 2013-01-08 NOTE — ED Notes (Signed)
Pt at X-Ray

## 2013-01-08 NOTE — ED Notes (Signed)
Informed by lab that cmp tube was "grossly hemolyzed."  A new green top tube re-drawn and sent to lab.

## 2013-01-08 NOTE — ED Provider Notes (Signed)
10:08 AM pts daughter Banker) is concerned about her mothers history of afib and requests a telemetry bed given afib with RVR on her last admission. Orders changed  Lyanne Co, MD 01/08/13 1009

## 2013-01-08 NOTE — Progress Notes (Signed)
Amiodarone bolus given 50 mg given over 10 mins. VSS 128/70 86... Tele # [phone] showing AFIB. Pt tolerated bolus well. Cont with plan of care

## 2013-01-08 NOTE — Progress Notes (Signed)
  Pharmacy Note (Brief)  - Amiodarone  After discussion with Dr. Darnelle Catalan, based on kinetics of amiodarone and low prior-to-admission dose, decision made to hold amiodarone for now (both IV and PO) and see if nausea resolves. If it resolves in the next few days, can resume PO amiodarone. If intractable nausea still continues after several days, can revisit the idea of using IV amiodarone.  Darrol Angel, PharmD Pager: [phone] 01/08/2013 4:05 PM

## 2013-01-08 NOTE — Progress Notes (Signed)
Patient declined Mychart. Does not have compter.

## 2013-01-09 ENCOUNTER — Encounter (HOSPITAL_COMMUNITY)
Admission: EM | Disposition: A | Discharge status: Home / Self Care | Payer: Self-pay | Source: Home / Self Care | Attending: Internal Medicine

## 2013-01-09 ENCOUNTER — Encounter (HOSPITAL_COMMUNITY): Payer: Self-pay

## 2013-01-09 DIAGNOSIS — I4891 Unspecified atrial fibrillation: Secondary | ICD-10-CM

## 2013-01-09 DIAGNOSIS — R1115 Cyclical vomiting syndrome unrelated to migraine: Secondary | ICD-10-CM

## 2013-01-09 DIAGNOSIS — N390 Urinary tract infection, site not specified: Secondary | ICD-10-CM

## 2013-01-09 DIAGNOSIS — R6889 Other general symptoms and signs: Secondary | ICD-10-CM

## 2013-01-09 DIAGNOSIS — R7989 Other specified abnormal findings of blood chemistry: Secondary | ICD-10-CM | POA: Diagnosis present

## 2013-01-09 HISTORY — PX: ESOPHAGOGASTRODUODENOSCOPY: SHX5428

## 2013-01-09 LAB — CBC
HCT: 43.3 % (ref 36.0–46.0)
Hemoglobin: 13.6 g/dL (ref 12.0–15.0)
MCV: 93.3 fL (ref 78.0–100.0)
WBC: 13.1 10*3/uL — ABNORMAL HIGH (ref 4.0–10.5)

## 2013-01-09 LAB — BASIC METABOLIC PANEL
CO2: 32 mEq/L (ref 19–32)
Chloride: 104 mEq/L (ref 96–112)
GFR calc Af Amer: 37 mL/min — ABNORMAL LOW (ref >90)
Potassium: 3.9 mEq/L (ref 3.5–5.1)

## 2013-01-09 LAB — LIPASE, BLOOD: Lipase: 17 U/L (ref 11–59)

## 2013-01-09 SURGERY — EGD (ESOPHAGOGASTRODUODENOSCOPY)
Anesthesia: Moderate Sedation

## 2013-01-09 MED ORDER — DEXTROSE 5 % IV SOLN
1.0000 g | INTRAVENOUS | Status: DC
  Administered 2013-01-09 – 2013-01-10 (×2): 1 g via INTRAVENOUS
  Filled 2013-01-09 (×2): qty 10

## 2013-01-09 MED ORDER — FENTANYL CITRATE 0.05 MG/ML IJ SOLN
INTRAMUSCULAR | Status: DC | PRN
  Administered 2013-01-09: 25 ug via INTRAVENOUS
  Administered 2013-01-09: 15 ug via INTRAVENOUS

## 2013-01-09 MED ORDER — MIDAZOLAM HCL 10 MG/2ML IJ SOLN
INTRAMUSCULAR | Status: DC | PRN
  Administered 2013-01-09: 1 mg via INTRAVENOUS
  Administered 2013-01-09: 2 mg via INTRAVENOUS

## 2013-01-09 MED ORDER — BUTAMBEN-TETRACAINE-BENZOCAINE 2-2-14 % EX AERO
INHALATION_SPRAY | CUTANEOUS | Status: DC | PRN
  Administered 2013-01-09: 2 via TOPICAL

## 2013-01-09 MED ORDER — SUCRALFATE 1 GM/10ML PO SUSP
1.0000 g | Freq: Three times a day (TID) | ORAL | Status: DC
  Administered 2013-01-09 – 2013-01-10 (×4): 1 g via ORAL
  Filled 2013-01-09 (×7): qty 10

## 2013-01-09 MED ORDER — SODIUM CHLORIDE 0.9 % IV SOLN
INTRAVENOUS | Status: DC
  Administered 2013-01-09: 07:00:00 via INTRAVENOUS
  Administered 2013-01-09: 500 mL via INTRAVENOUS

## 2013-01-09 NOTE — Progress Notes (Signed)
[facility] PROGRESS NOTE  Carla [name] WUJ:[id number] DOB: 1921/11/22 DOA: 01/08/2013  PCP: Carla Inch, MD  Brief HPI: Carla [name] is an 79 y.o. female with a PMH of atrial fibrillation, dyslipidemia, hiatal hernia who presented with a 24 hour history of the sudden onset of nausea and vomiting. Multiple episodes through the night, with 9 episodes noted by ED staff. No hematemesis, described as brown in color. Had a similar episode in 2/14, which seemed to be triggered by by a UTI. No associated diarrhea. No associated fever or chills. No sick contacts. Did not take any OTC or prescription anti-emetics. Initially thought meal intake triggered symptoms, but had persistent vomiting through the night. The patient underwent a CT scan in the ER which showed a large hiatal hernia, but it appeared to be stable from prior scans. She had a colonoscopy about 2 years ago, done by Dr. Madilyn [name], but has not had any ongoing GI care.  Past medical history:  Past Medical History  Diagnosis Date  . Iron deficiency anemia   . High cholesterol   . Osteoarthritis   . Female bladder prolapse   . Osteoporosis   . Cataracts, bilateral   . Gallstones   . Hemorrhoids   . Diverticulosis   . Hiatal hernia   . Atrial fibrillation   . Cystocele   . Rectocele   . Scoliosis   . Lordosis   . Hyperlipidemia   . Osteoarthritis     Consultants: [facility]  Procedures: EGD scheduled for 7/8  Antibiotics: None  Subjective: Patient feels fine. No further nausea or vomiting overnight. Denies any discomfort with NG tube. Passed some flatus. Denies abdominal pain.  Objective: Vital Signs  Filed Vitals:   01/08/13 1150 01/08/13 1509 01/08/13 2034 01/09/13 0620  BP: 152/82 128/70 131/56 138/55  Pulse: 85 81 63 67  Temp: 99.9 F (37.7 C) 97.7 F (36.5 C) 97.5 F (36.4 C) 98.2 F (36.8 C)  TempSrc: Oral Oral Oral Oral  Resp:  18 19 19   Height: 5\' 3"  (1.6 m)     Weight: 69.4 kg (153 lb)      SpO2: 87% 97% 100% 98%    Intake/Output Summary (Last 24 hours) at 01/09/13 9562 Last data filed at 01/09/13 0915  Gross per 24 hour  Intake 1321.25 ml  Output   1525 ml  Net -203.75 ml   Filed Weights   01/08/13 0544 01/08/13 1150  Weight: 69.4 kg (153 lb) 69.4 kg (153 lb)    General appearance: alert, cooperative, appears stated age and no distress Head: Normocephalic, without obvious abnormality, atraumatic Resp: clear to auscultation bilaterally Cardio: regular rate and rhythm, S1, S2 normal, no murmur, click, rub or gallop GI: soft, non-tender; bowel sounds normal; no masses,  no organomegaly Extremities: extremities normal, atraumatic, no cyanosis or edema Neurologic: Alert and oriented x 3. No focal deficits.  Lab Results:  Basic Metabolic Panel:  Recent Labs Lab 01/08/13 0705 01/08/13 1321 01/09/13 0450  NA 142  --  141  K 4.3  --  3.9  CL 99  --  104  CO2 34*  --  32  GLUCOSE 176*  --  110*  BUN 22  --  22  CREATININE 1.39* 1.42* 1.43*  CALCIUM 9.6  --  8.5   Liver Function Tests:  Recent Labs Lab 01/08/13 0705  AST 14  ALT 7  ALKPHOS 86  BILITOT 0.3  PROT 8.1  ALBUMIN 3.3*    Recent Labs Lab  01/08/13 0705  LIPASE 75*   CBC:  Recent Labs Lab 01/08/13 0629 01/08/13 1321 01/09/13 0450  WBC 16.5* 18.6* 13.1*  NEUTROABS 14.7*  --   --   HGB 17.2* 16.0* 13.6  HCT 51.6* 49.5* 43.3  MCV 90.5 91.8 93.3  PLT 250 278 256   BNP (last 3 results)  Recent Labs  08/23/12 0418 08/24/12 0350 08/27/12 0540  PROBNP 7669.0* 9342.0* 2085.0*   CBG: No results found for this basename: GLUCAP,  in the last 168 hours  Recent Results (from the past 240 hour(s))  URINE CULTURE     Status: None   Collection Time    01/08/13  7:59 AM      Result Value Range Status   Specimen Description URINE, CATHETERIZED   Final   Special Requests NONE   Final   Culture  Setup Time 01/08/2013 18:04   Final   Colony Count >=100,000 COLONIES/ML   Final    Culture GRAM NEGATIVE RODS   Final   Report Status PENDING   Incomplete      Studies/Results: Ct Abdomen Pelvis Wo Contrast  01/08/2013   *RADIOLOGY REPORT*  Clinical Data: Periumbilical pain.  Vomiting.  CT ABDOMEN AND PELVIS WITHOUT CONTRAST  Technique:  Multidetector CT imaging of the abdomen and pelvis was performed following the standard protocol without intravenous contrast.  Comparison: 08/19/2012  Findings: As seen on the previous study, there is a large hiatal hernia containing at least 50% of the stomach.  The stomach is dilated and fluid-filled, similar to the appearance on the previous exam.  No focal abnormalities seen in the liver on this study performed without intravenous contrast material.  Calcified granulomata are noted in the spleen.  The duodenum is unremarkable.  Pancreas is fatty replaced.  Gallbladder is surgically absent.  No adrenal mass.  Stable appearance of 3.3 cm left renal cyst.  Right kidney is unremarkable.  Atherosclerotic calcification noted in the abdominal aorta without aneurysm.  No free fluid or lymphadenopathy in the abdomen.  Imaging through the pelvis shows no free intraperitoneal fluid.  A pessary is noted in the vagina.  Air in the urinary bladder is presumably secondary to recent instrumentation.  Extensive diverticular change noted in the sigmoid colon without overt features of diverticulitis.  No evidence for bowel obstruction. Terminal ileum is normal.  The appendix is normal.  Bones are diffusely demineralized with lumbar scoliosis again noted.  IMPRESSION: Overall stable exam.  Large hiatal hernia with approximately one half of the stomach position in the lower chest.  The stomach is distended and fluid- filled, but the features are essentially identical to the study from 5 months ago.  No fluid or edema around the stomach at this time.  Advanced diverticular changes in the colon without evidence of diverticulitis.  No free fluid or lymphadenopathy.  Status  post cholecystectomy.   Original Report Authenticated By: Carla [name], M.D.   Dg Abd Acute W/chest  01/08/2013   *RADIOLOGY REPORT*  Clinical Data: Nausea and vomiting since last night after eating.  ACUTE ABDOMEN SERIES (ABDOMEN 2 VIEW & CHEST 1 VIEW)  Comparison: Chest 08/27/2012  Findings: Shallow inspiration with elevation right hemidiaphragm. Heart size and pulmonary vascularity are normal.  No focal airspace consolidation in the lungs.  No blunting of costophrenic angles. No pneumothorax.  Calcification of the aorta.  Large esophageal hiatal hernia behind the heart.  Gas and stool throughout the colon.  No small or large bowel distension.  No free intra-abdominal  air.  No abnormal air fluid levels.  No radiopaque stones.  Scoliosis and degenerative changes in the spine.  Vascular calcifications.  Shadow over the pelvis consistent with a pessary.  IMPRESSION: Large esophageal hiatal hernia behind the heart.  No evidence of active pulmonary disease.  Nonobstructive bowel gas pattern.   Original Report Authenticated By: Burman Nieves, M.D.    Medications:  Scheduled: . conjugated estrogens  1 g Vaginal 3 times weekly  . enoxaparin (LOVENOX) injection  30 mg Subcutaneous Daily  . metoCLOPramide (REGLAN) injection  5 mg Intravenous Q6H  . pantoprazole (PROTONIX) IV  40 mg Intravenous Daily  . sodium chloride  3 mL Intravenous Q12H   Continuous: . sodium chloride 75 mL/hr at 01/09/13 0122  . sodium chloride 20 mL/hr at 01/09/13 4782   NFA:OZHYQMVHQIONG, acetaminophen, morphine injection, ondansetron (ZOFRAN) IV, ondansetron  Assessment/Plan:  Principal Problem:   Intractable nausea and vomiting Active Problems:   Dehydration   Atrial fibrillation   Acute kidney injury   Elevated lipase   Hyperglycemia    Intractable nausea and vomiting / Elevated lipase level  No evidence of gastric outlet obstruction, but CT showed a large esophageal hiatal hernia. LFTs WNL. Checl Lipase today.  Await EGD. Appreciate GI input. Continue PPI. Continue NG for now. IVF. Treat UTI.  Elevated TSH Could have Amiodarone induced thyroid toxicity. Check Ft4 and Ft3.  Abnormal UA/UTI Culture pending. Initiate abx. Leukocytosis could be explained by UTI and dehydration.  Hyperglycemia  Hemoglobin A1c is 6.1.  Continue to monitor.  Dehydration  Maintain on IVF, NPO.   History of Atrial fibrillation  Diagnosed 3 months ago. Currently in NSR. Was on Amiodarone at home. Not on aspirin or anticoagulants at home. Her cards is Dr. P [place]  Acute kidney injury  Likely from dehydration. Hydrate and monitor.  Code Status:  Full Code DVT Prophylaxis:   Enoxaparin Family Communication: [name] or Wilfrid Lund (daughter/son-in-law). [phone]. Discussed with patient nad her son-in-law at bedside. Disposition Plan: Lives independently. Will need PT/OT once better.    LOS: 1 day   [name]  [facility] Pager [phone] 01/09/2013, 9:25 AM  If 8PM-8AM, please contact night-coverage at [email or web address], password [name]

## 2013-01-09 NOTE — H&P (View-Only) (Signed)
[facility] CONSULT Reason for consult: nausea and vomiting Referring Physician: [facility]. Primary G.I.: Dr. Dorena Cookey. PCP: Dr. Larita Fife  Carla [name] is an 79 y.o. female.  HPI: 79 year old woman who had been doing well until yesterday when she had relatively sudden onset of severe nausea. She had a normal bowel movement earlier in the day with no diarrhea. She had no melena no hematemesis. She adamantly denies the usef NSAIDs. Minimal abdominal pain. There was question if this could be due to amiodarone, however patient has been on amiodarone at the same goes for some time. She feels much better with placement of NG tube. This is training thick gelatinous material not coffee ground material. Patient is post cholecystectomy. She is not had any real problems with reflux or ulcers. She has taken ranitidine PRN for reflux symptoms. She feels much better with the NG tube. Labs remarkable for UTI culture pending. Hemoglobin 17. Patient does need history that she is chronically iron deficient has been intermittently taking iron for 15 years. She underwent colonoscopy by Dr. Madilyn Fireman 3 to 4 years ago with normal colonic biopsies in no particular findings. X-rays showed no son of bowel obstruction but did show a massive hiatal hernia with over half of the stomach and the chest with large air fluid level in the hernia sac.  Past Medical History  Diagnosis Date  . Iron deficiency anemia   . High cholesterol   . Osteoarthritis   . Female bladder prolapse   . Osteoporosis   . Cataracts, bilateral   . Gallstones   . Hemorrhoids   . Diverticulosis   . Hiatal hernia   . Atrial fibrillation   . Cystocele   . Rectocele   . Scoliosis   . Lordosis   . Hyperlipidemia   . Osteoarthritis     Past Surgical History  Procedure Laterality Date  . Cholecystectomy    . Cataract extraction    . Esophagogastroduodenoscopy    . Colonoscopy    . Endometrial biopsy  09/2009    Family  History  Problem Relation Age of Onset  . Breast cancer Paternal Aunt   . Breast cancer Paternal Aunt   . Cancer      several family members on dad's side of family  . Diabetes Father   . Leukemia Father   . Heart failure Brother     Pt cannot confirm this history    Social History:  reports that she has never smoked. She has never used smokeless tobacco. She reports that she does not drink alcohol or use illicit drugs.  Allergies: No Known Allergies  Medications; . conjugated estrogens  1 g Vaginal 3 times weekly  . enoxaparin (LOVENOX) injection  30 mg Subcutaneous Daily  . metoCLOPramide (REGLAN) injection  5 mg Intravenous Q6H  . pantoprazole (PROTONIX) IV  40 mg Intravenous Daily  . sodium chloride  3 mL Intravenous Q12H   PRN Meds acetaminophen, acetaminophen, morphine injection, ondansetron (ZOFRAN) IV, ondansetron Results for orders placed during the hospital encounter of 01/08/13 (from the past 48 hour(s))  CBC WITH DIFFERENTIAL     Status: Abnormal   Collection Time    01/08/13  6:29 AM      Result Value Range   WBC 16.5 (*) 4.0 - 10.5 K/uL   RBC 5.70 (*) 3.87 - 5.11 MIL/uL   Hemoglobin 17.2 (*) 12.0 - 15.0 g/dL   HCT 16.1 (*) 09.6 - 04.5 %   MCV 90.5  78.0 -  100.0 fL   MCH 30.2  26.0 - 34.0 pg   MCHC 33.3  30.0 - 36.0 g/dL   RDW 40.9 (*) 81.1 - 91.4 %   Platelets 250  150 - 400 K/uL   Neutrophils Relative % 89 (*) 43 - 77 %   Neutro Abs 14.7 (*) 1.7 - 7.7 K/uL   Lymphocytes Relative 7 (*) 12 - 46 %   Lymphs Abs 1.1  0.7 - 4.0 K/uL   Monocytes Relative 4  3 - 12 %   Monocytes Absolute 0.6  0.1 - 1.0 K/uL   Eosinophils Relative 0  0 - 5 %   Eosinophils Absolute 0.0  0.0 - 0.7 K/uL   Basophils Relative 0  0 - 1 %   Basophils Absolute 0.0  0.0 - 0.1 K/uL  POCT I-STAT TROPONIN I     Status: None   Collection Time    01/08/13  6:54 AM      Result Value Range   Troponin i, poc 0.00  0.00 - 0.08 ng/mL   Comment 3            Comment: Due to the release  kinetics of cTnI,     a negative result within the first hours     of the onset of symptoms does not rule out     myocardial infarction with certainty.     If myocardial infarction is still suspected,     repeat the test at appropriate intervals.  COMPREHENSIVE METABOLIC PANEL     Status: Abnormal   Collection Time    01/08/13  7:05 AM      Result Value Range   Sodium 142  135 - 145 mEq/L   Potassium 4.3  3.5 - 5.1 mEq/L   Chloride 99  96 - 112 mEq/L   CO2 34 (*) 19 - 32 mEq/L   Glucose, Bld 176 (*) 70 - 99 mg/dL   BUN 22  6 - 23 mg/dL   Creatinine, Ser 7.82 (*) 0.50 - 1.10 mg/dL   Calcium 9.6  8.4 - 95.6 mg/dL   Total Protein 8.1  6.0 - 8.3 g/dL   Albumin 3.3 (*) 3.5 - 5.2 g/dL   AST 14  0 - 37 U/L   ALT 7  0 - 35 U/L   Alkaline Phosphatase 86  39 - 117 U/L   Total Bilirubin 0.3  0.3 - 1.2 mg/dL   GFR calc non Af Amer 33 (*) >90 mL/min   GFR calc Af Amer 38 (*) >90 mL/min   Comment:            The eGFR has been calculated     using the CKD EPI equation.     This calculation has not been     validated in all clinical     situations.     eGFR's persistently     <90 mL/min signify     possible Chronic Kidney Disease.  LIPASE, BLOOD     Status: Abnormal   Collection Time    01/08/13  7:05 AM      Result Value Range   Lipase 75 (*) 11 - 59 U/L  URINALYSIS, ROUTINE W REFLEX MICROSCOPIC     Status: Abnormal   Collection Time    01/08/13  7:59 AM      Result Value Range   Color, Urine YELLOW  YELLOW   APPearance CLOUDY (*) CLEAR   Specific Gravity, Urine 1.018  1.005 - 1.030  pH 8.0  5.0 - 8.0   Glucose, UA NEGATIVE  NEGATIVE mg/dL   Hgb urine dipstick LARGE (*) NEGATIVE   Bilirubin Urine NEGATIVE  NEGATIVE   Ketones, ur NEGATIVE  NEGATIVE mg/dL   Protein, ur 102 (*) NEGATIVE mg/dL   Urobilinogen, UA 0.2  0.0 - 1.0 mg/dL   Nitrite NEGATIVE  NEGATIVE   Leukocytes, UA LARGE (*) NEGATIVE  URINE MICROSCOPIC-ADD ON     Status: Abnormal   Collection Time    01/08/13   7:59 AM      Result Value Range   Squamous Epithelial / LPF RARE  RARE   WBC, UA TOO NUMEROUS TO COUNT  <3 WBC/hpf   RBC / HPF 11-20  <3 RBC/hpf   Bacteria, UA MANY (*) RARE  CBC     Status: Abnormal   Collection Time    01/08/13  1:21 PM      Result Value Range   WBC 18.6 (*) 4.0 - 10.5 K/uL   RBC 5.39 (*) 3.87 - 5.11 MIL/uL   Hemoglobin 16.0 (*) 12.0 - 15.0 g/dL   HCT 72.5 (*) 36.6 - 44.0 %   MCV 91.8  78.0 - 100.0 fL   MCH 29.7  26.0 - 34.0 pg   MCHC 32.3  30.0 - 36.0 g/dL   RDW 34.7 (*) 42.5 - 95.6 %   Platelets 278  150 - 400 K/uL  CREATININE, SERUM     Status: Abnormal   Collection Time    01/08/13  1:21 PM      Result Value Range   Creatinine, Ser 1.42 (*) 0.50 - 1.10 mg/dL   GFR calc non Af Amer 32 (*) >90 mL/min   GFR calc Af Amer 37 (*) >90 mL/min   Comment:            The eGFR has been calculated     using the CKD EPI equation.     This calculation has not been     validated in all clinical     situations.     eGFR's persistently     <90 mL/min signify     possible Chronic Kidney Disease.    Ct Abdomen Pelvis Wo Contrast  01/08/2013   *RADIOLOGY REPORT*  Clinical Data: Periumbilical pain.  Vomiting.  CT ABDOMEN AND PELVIS WITHOUT CONTRAST  Technique:  Multidetector CT imaging of the abdomen and pelvis was performed following the standard protocol without intravenous contrast.  Comparison: 08/19/2012  Findings: As seen on the previous study, there is a large hiatal hernia containing at least 50% of the stomach.  The stomach is dilated and fluid-filled, similar to the appearance on the previous exam.  No focal abnormalities seen in the liver on this study performed without intravenous contrast material.  Calcified granulomata are noted in the spleen.  The duodenum is unremarkable.  Pancreas is fatty replaced.  Gallbladder is surgically absent.  No adrenal mass.  Stable appearance of 3.3 cm left renal cyst.  Right kidney is unremarkable.  Atherosclerotic calcification  noted in the abdominal aorta without aneurysm.  No free fluid or lymphadenopathy in the abdomen.  Imaging through the pelvis shows no free intraperitoneal fluid.  A pessary is noted in the vagina.  Air in the urinary bladder is presumably secondary to recent instrumentation.  Extensive diverticular change noted in the sigmoid colon without overt features of diverticulitis.  No evidence for bowel obstruction. Terminal ileum is normal.  The appendix is normal.  Bones are diffusely demineralized  with lumbar scoliosis again noted.  IMPRESSION: Overall stable exam.  Large hiatal hernia with approximately one half of the stomach position in the lower chest.  The stomach is distended and fluid- filled, but the features are essentially identical to the study from 5 months ago.  No fluid or edema around the stomach at this time.  Advanced diverticular changes in the colon without evidence of diverticulitis.  No free fluid or lymphadenopathy.  Status post cholecystectomy.   Original Report Authenticated By: Kennith Center, M.D.   Dg Abd Acute W/chest  01/08/2013   *RADIOLOGY REPORT*  Clinical Data: Nausea and vomiting since last night after eating.  ACUTE ABDOMEN SERIES (ABDOMEN 2 VIEW & CHEST 1 VIEW)  Comparison: Chest 08/27/2012  Findings: Shallow inspiration with elevation right hemidiaphragm. Heart size and pulmonary vascularity are normal.  No focal airspace consolidation in the lungs.  No blunting of costophrenic angles. No pneumothorax.  Calcification of the aorta.  Large esophageal hiatal hernia behind the heart.  Gas and stool throughout the colon.  No small or large bowel distension.  No free intra-abdominal air.  No abnormal air fluid levels.  No radiopaque stones.  Scoliosis and degenerative changes in the spine.  Vascular calcifications.  Shadow over the pelvis consistent with a pessary.  IMPRESSION: Large esophageal hiatal hernia behind the heart.  No evidence of active pulmonary disease.  Nonobstructive bowel  gas pattern.   Original Report Authenticated By: Burman Nieves, M.D.               Blood pressure 128/70, pulse 81, temperature 97.7 F (36.5 C), temperature source Oral, resp. rate 18, height 5\' 3"  (1.6 m), weight 69.4 kg (153 lb), SpO2 97.00%.  Physical exam:   General-- pleasant white female in no distress, NG tube draining large amount of thick gelatinous material Heart-- normal Lungs--clear Abdomen-- none distended soft and nontender   Assessment: 1. Nausea and vomiting -- this could be anything from an acute viral illness to some reaction to amiodarone. The x-ray and CT findings to suggest an outlet obstruction without clear SBO. I think she should have endoscopy to evaluate dysuria endoscopically after the large amount of liquid in the stomach has been adequately removed  Plan: we will continue NG suction overnight and will plan EGD and the a.m. to evaluate the gastric outlet for possible obstruction, ulceration etc. Have discussed this with the patient and family  Keil Pickering JR,Nikeya Maxim L 01/08/2013, 5:20 PM

## 2013-01-09 NOTE — Interval H&P Note (Signed)
History and Physical Interval Note:  01/09/2013 1:15 PM  Carla [name]  has presented today for surgery, with the diagnosis of nausea and vomiting  The various methods of treatment have been discussed with the patient and family. After consideration of risks, benefits and other options for treatment, the patient has consented to  Procedure(s): ESOPHAGOGASTRODUODENOSCOPY (EGD) (N/A) as a surgical intervention .  The patient's history has been reviewed, patient examined, no change in status, stable for surgery.  I have reviewed the patient's chart and labs.  Questions were answered to the patient's satisfaction.     Andora Krull JR,Conard Alvira L

## 2013-01-09 NOTE — Op Note (Signed)
[facility] [address] [place], [address]   ENDOSCOPY PROCEDURE REPORT  PATIENT: Carla, [name]  MR#: [id number] BIRTHDATE: 19-Oct-1921 , 89  yrs. old GENDER: Female ENDOSCOPIST:Ruthellen Tippy Randa Evens, MD REFERRED BY:  [facility], PCP: Dr Cyndia Bent PROCEDURE DATE:  01/09/2013 PROCEDURE:   EGD ASA CLASS: class 2 INDICATIONS:   patient went nausea and vomiting. CT scan showed partial intrathoracic with air fluid level. Patient has had NG tube in place. As history of iron deficiency anemia MEDICATION:   fentanyl 40 mcg, versed 3 mg IV TOPICAL ANESTHETIC:    cetacaine spray  DESCRIPTION OF PROCEDURE:   The procedure had been explained to the patient and consent was obtained. The NG tube was removed after the patient had been sedated. The scope is passed into the esophagus which was quite short. It was estimated the patient had approximately 2/3 intrathoracic stomach. There was moderate ulcerative esophagitis just above the GE junction. There was several prominent erosions in the large hiatal hernia sac that were not actively bleeding. The diaphragmatic hiatus was clearly seen and was passed. The patient had a large stomach but we were able to pass the gastric outlet into the duodenum. Support channel was normal and there were no signs of duodenal or ulcers. The scope is withdrawn and the initial findings were confirmed. Patient tolerated procedure well     COMPLICATIONS: None  ENDOSCOPIC IMPRESSION: 1. 2/3 Intrathoracic Stomach. 2. Ulcerative esophagitis 3. Erosive gastritis in massive hiatal hernia  RECOMMENDATIONS: 1. Would leave out NG tube 2. Full liquid diet 3. We'll had Carafate slurry.     _______________________________ Rosalie DoctorCarman Ching, MD 01/09/2013 1:50 PM    CC: Dr Cyndia Bent     PATIENT NAME:  Carla, [name] MR#: [id number]

## 2013-01-10 ENCOUNTER — Encounter (HOSPITAL_COMMUNITY): Payer: Self-pay | Admitting: Gastroenterology

## 2013-01-10 LAB — URINE CULTURE: Colony Count: >=100000

## 2013-01-10 LAB — COMPREHENSIVE METABOLIC PANEL
ALT: 5 U/L (ref 0–35)
AST: 13 U/L (ref 0–37)
Albumin: 2.7 g/dL — ABNORMAL LOW (ref 3.5–5.2)
CO2: 34 mEq/L — ABNORMAL HIGH (ref 19–32)
Calcium: 8.6 mg/dL (ref 8.4–10.5)
Chloride: 105 mEq/L (ref 96–112)
GFR calc non Af Amer: 31 mL/min — ABNORMAL LOW (ref >90)
Sodium: 145 mEq/L (ref 135–145)
Total Bilirubin: 0.3 mg/dL (ref 0.3–1.2)

## 2013-01-10 LAB — T3, FREE: T3, Free: 2.1 pg/mL — ABNORMAL LOW (ref 2.3–4.2)

## 2013-01-10 LAB — CBC
Platelets: 241 10*3/uL (ref 150–400)
RBC: 4.44 MIL/uL (ref 3.87–5.11)
WBC: 11.9 10*3/uL — ABNORMAL HIGH (ref 4.0–10.5)

## 2013-01-10 LAB — T4, FREE: Free T4: 0.57 ng/dL — ABNORMAL LOW (ref 0.80–1.80)

## 2013-01-10 MED ORDER — SUCRALFATE 1 GM/10ML PO SUSP: 1.0000 g | Freq: Three times a day (TID) | ORAL | Status: DC

## 2013-01-10 MED ORDER — METOCLOPRAMIDE HCL 5 MG PO TABS: 5.0000 mg | ORAL_TABLET | Freq: Three times a day (TID) | ORAL | Status: DC

## 2013-01-10 MED ORDER — PANTOPRAZOLE SODIUM 40 MG PO TBEC: 40.0000 mg | DELAYED_RELEASE_TABLET | Freq: Every day | ORAL | Status: DC

## 2013-01-10 MED ORDER — PANTOPRAZOLE SODIUM 40 MG PO TBEC: 40.0000 mg | DELAYED_RELEASE_TABLET | Freq: Two times a day (BID) | ORAL | Status: DC

## 2013-01-10 MED ORDER — METOCLOPRAMIDE HCL 5 MG PO TABS
5.0000 mg | ORAL_TABLET | Freq: Three times a day (TID) | ORAL | Status: DC
  Administered 2013-01-10: 5 mg via ORAL
  Filled 2013-01-10 (×4): qty 1

## 2013-01-10 NOTE — Discharge Summary (Addendum)
Physician Discharge Summary  Carla [name]:[id number] DOB: September 16, 1921 DOA: 01/08/2013  PCP: [name], MD  Admit date: 01/08/2013 Discharge date: 01/10/2013  Recommendations for Outpatient Follow-up:  1. Pt will need to follow up with PCP in 2-3 weeks post discharge 2. Please obtain BMP to evaluate electrolytes and kidney function 3. Please also check CBC to evaluate Hg and Hct levels 4. Please note that pt had UA tested in ED and results on urine culture showed Proteus mirabilis pan sensitive except to nitrofurantoin, pt had no urinary concern so no ABX given 5. If urinary symptom appear please consider above note on urinae culture  6. Please note that TSH elevated at 15, pt not willing to start medication so I have recommended follow up with PCP to decide on further management, may repeat TSH or start low dose Synthroid 7. Please note findings on EGD done by Dr. Randa Evens: recommendation was to continue Protonix BID and to see Dr. Randa Evens in 6 weeks --> ENDOSCOPIC IMPRESSION: 2/3 Intrathoracic Stomach. Ulcerative esophagitis. Erosive gastritis in massive hiatal hernia   Discharge Diagnoses: Ulcerative esophagitis and erosive gastritis  Principal Problem:   Intractable nausea and vomiting Active Problems:   Dehydration   Atrial fibrillation   Acute kidney injury   Urinary tract infection   Elevated lipase   Hyperglycemia   Abnormal TSH  Discharge Condition: Stable  Diet recommendation: Heart healthy diet discussed in details   History of present illness:  79 y.o. female with a PMH of atrial fibrillation, dyslipidemia, hiatal hernia who presented with a 24 hour history of the sudden onset of nausea and vomiting. Multiple episodes through the night, with 9 episodes noted by ED staff. No hematemesis, described as brown in color. No associated diarrhea. No associated fever or chills. No sick contacts. Did not take any OTC or prescription anti-emetics. Initially thought meal  intake triggered symptoms, but had persistent vomiting through the night. The patient underwent a CT scan in the ER which showed a large hiatal hernia, but it appeared to be stable from prior scans. She had a colonoscopy about 2 years ago, done by Dr. Madilyn Fireman, but has not had any ongoing GI care.   Hospital Course:  Principal Problem:   Intractable nausea and vomiting - likely secondary to esophagitis and gastritis as noted on EGD - continuing Protonix BID and GI follow up recommended  - discussed avoiding NSAIDS Active Problems:   Atrial fibrillation - in sinus rhythm this AM, continue Amiodarone    Acute kidney injury - likely secondary to pre renal etiology from dehydration in the setting of vomiting and poor oral intake  - will need close outpatient monitoring, pt made aware    Urinary tract infection - based on UA but no actual urinary symptoms - urine culture with proteus - will notify PCP   Elevated lipase - likely secondary to principal problem - now stable and within normal limits    Hyperglycemia - A1C 6.1, dietary recommendations provided    Abnormal TSH - discussed with pt, close follow up with PCP advised to consider initiation of Synthroid   Procedures/Studies: Ct Abdomen Pelvis Wo Contrast 01/08/2013 Overall stable exam.  Large hiatal hernia with approximately one half of the stomach position in the lower chest.  The stomach is distended and fluid- filled, but the features are essentially identical to the study from 5 months ago.  No fluid or edema around the stomach at this time.  Advanced diverticular changes in the colon without evidence  of diverticulitis.  No free fluid or lymphadenopathy.  Status post cholecystectomy.     Dg Abd Acute W/chest 01/08/2013    Large esophageal hiatal hernia behind the heart.  No evidence of active pulmonary disease.  Nonobstructive bowel gas pattern.     Consultations:  GI  Antibiotics:  None  Discharge Exam: Filed Vitals:    01/10/13 0631  BP: 140/71  Pulse: 64  Temp: 97.5 F (36.4 C)  Resp: 18   Filed Vitals:   01/09/13 1428 01/09/13 1452 01/09/13 2027 01/10/13 0631  BP: 142/57 133/57 120/47 140/71  Pulse: 62 59 79 64  Temp: 97.7 F (36.5 C) 97.9 F (36.6 C) 98.1 F (36.7 C) 97.5 F (36.4 C)  TempSrc: Oral Oral Oral Oral  Resp: 16 18 18 18   Height:      Weight:      SpO2: 91% 93% 93% 94%    General: Pt is alert, follows commands appropriately, not in acute distress Cardiovascular: Regular rate and rhythm, S1/S2 +, no murmurs, no rubs, no gallops Respiratory: Clear to auscultation bilaterally, no wheezing, no crackles, no rhonchi Abdominal: Soft, non tender, non distended, bowel sounds +, no guarding Extremities: no edema, no cyanosis, pulses palpable bilaterally DP and PT Neuro: Grossly nonfocal  Discharge Instructions  Discharge Orders   Future Appointments Provider Department Dept Phone   01/23/2013 1:45 PM Peter M [place], MD [facility] Main [facility] [place]) [phone]   02/06/2013 11:15 AM Lauro Franklin, FNP [facility] [phone]   Future Orders Complete By Expires     Diet - low sodium heart healthy  As directed     Increase activity slowly  As directed         Medication List         acetaminophen 500 MG tablet  Commonly known as:  TYLENOL  Take 500 mg by mouth every 6 (six) hours as needed for pain.     amiodarone 200 MG tablet  Commonly known as:  PACERONE  Take 0.5 tablets (100 mg total) by mouth daily.     calcium-vitamin D 500-200 MG-UNIT per tablet  Commonly known as:  OSCAL WITH D  Take 1 tablet by mouth daily.     cholecalciferol 1000 UNITS tablet  Commonly known as:  VITAMIN D  Take 1,000 Units by mouth daily.     conjugated estrogens vaginal cream  Commonly known as:  PREMARIN  Place 0.5 Applicatorfuls vaginally 3 (three) times a week. LOT: [id number]   EXP: 08/2013     docusate sodium 100 MG capsule  Commonly known as:   COLACE  Take 100 mg by mouth 2 (two) times daily as needed for constipation (constipation).     metoCLOPramide 5 MG tablet  Commonly known as:  REGLAN  Take 1 tablet (5 mg total) by mouth 4 (four) times daily -  before meals and at bedtime.     OSTEO ADVANCE Tabs  Take 2 tablets by mouth daily.     pantoprazole 40 MG tablet  Commonly known as:  PROTONIX  Take 1 tablet (40 mg total) by mouth 2 (two) times daily.     ranitidine 150 MG tablet  Commonly known as:  ZANTAC  Take 150 mg by mouth 2 (two) times daily as needed for heartburn (as needed   heartburn).     sucralfate 1 GM/10ML suspension  Commonly known as:  CARAFATE  Take 10 mLs (1 g total) by mouth 4 (four) times daily -  with  meals and at bedtime.     vitamin C 500 MG tablet  Commonly known as:  ASCORBIC ACID  Take 500 mg by mouth daily.     Vitamin D (Ergocalciferol) 50000 UNITS Caps  Commonly known as:  DRISDOL  Take 50,000 Units by mouth.           Follow-up Information   Follow up with BADGER,MICHAEL C, MD In 2 weeks.   Contact information:   [address] [place] [address] [phone]       Follow up with EDWARDS JR,JAMES L, MD In 6 weeks.   Contact information:   [address]                         [place]he [place] [address] [phone]      The results of significant diagnostics from this hospitalization (including imaging, microbiology, ancillary and laboratory) are listed below for reference.     Microbiology: Recent Results (from the past 240 hour(s))  URINE CULTURE     Status: None   Collection Time    01/08/13  7:59 AM      Result Value Range Status   Specimen Description URINE, CATHETERIZED   Final   Special Requests NONE   Final   Culture  Setup Time 01/08/2013 18:04   Final   Colony Count >=100,000 COLONIES/ML   Final   Culture PROTEUS MIRABILIS   Final   Report Status 01/10/2013 FINAL   Final   Organism ID, Bacteria PROTEUS MIRABILIS   Final      Labs: Basic Metabolic Panel:  Recent Labs Lab 01/08/13 0705 01/08/13 1321 01/09/13 0450 01/10/13 0430  NA 142  --  141 145  K 4.3  --  3.9 4.4  CL 99  --  104 105  CO2 34*  --  32 34*  GLUCOSE 176*  --  110* 100*  BUN 22  --  22 21  CREATININE 1.39* 1.42* 1.43* 1.44*  CALCIUM 9.6  --  8.5 8.6   Liver Function Tests:  Recent Labs Lab 01/08/13 0705 01/10/13 0430  AST 14 13  ALT 7 5  ALKPHOS 86 67  BILITOT 0.3 0.3  PROT 8.1 6.7  ALBUMIN 3.3* 2.7*    Recent Labs Lab 01/08/13 0705 01/09/13 0450 01/10/13 0430  LIPASE 75* 17 19   CBC:  Recent Labs Lab 01/08/13 0629 01/08/13 1321 01/09/13 0450 01/10/13 0430  WBC 16.5* 18.6* 13.1* 11.9*  NEUTROABS 14.7*  --   --   --   HGB 17.2* 16.0* 13.6 13.0  HCT 51.6* 49.5* 43.3 41.8  MCV 90.5 91.8 93.3 94.1  PLT 250 278 256 241   BNP (last 3 results)  Recent Labs  08/23/12 0418 08/24/12 0350 08/27/12 0540  PROBNP 7669.0* 9342.0* 2085.0*   SIGNED: Time coordinating discharge: Over 30 minutes  Debbora Presto, MD  [facility] 01/10/2013, 1:21 PM Pager [phone]  If 7PM-7AM, please contact night-coverage [email or web address] Password [email or web address]

## 2013-01-10 NOTE — Progress Notes (Signed)
[facility] PROGRESS NOTE Subjective Pt eating w/o any problems to start regular diet today  Objective: Vital signs in last 24 hours: Temp:  [97.5 F (36.4 C)-98.1 F (36.7 C)] 97.5 F (36.4 C) (07/09 0631) Pulse Rate:  [59-79] 64 (07/09 0631) Resp:  [15-23] 18 (07/09 0631) BP: (119-163)/(47-80) 140/71 mmHg (07/09 0631) SpO2:  [91 %-98 %] 94 % (07/09 0631) Last BM Date: 01/07/13  Intake/Output from previous day: 07/08 0701 - 07/09 0700 In: 1687.5 [I.V.:1687.5] Out: 350 [Urine:350] Intake/Output this shift: Total I/O In: 120 [P.O.:120] Out: 250 [Urine:250]  PE:  Abdomen--soft and nontender  Lab Results:  Recent Labs  01/08/13 0629 01/08/13 1321 01/09/13 0450 01/10/13 0430  WBC 16.5* 18.6* 13.1* 11.9*  HGB 17.2* 16.0* 13.6 13.0  HCT 51.6* 49.5* 43.3 41.8  PLT 250 278 256 241   BMET  Recent Labs  01/08/13 0705 01/08/13 1321 01/09/13 0450 01/10/13 0430  NA 142  --  141 145  K 4.3  --  3.9 4.4  CL 99  --  104 105  CO2 34*  --  32 34*  CREATININE 1.39* 1.42* 1.43* 1.44*   LFT  Recent Labs  01/08/13 0705 01/10/13 0430  PROT 8.1 6.7  AST 14 13  ALT 7 5  ALKPHOS 86 67  BILITOT 0.3 0.3   PT/INR No results found for this basename: LABPROT, INR,  in the last 72 hours PANCREAS  Recent Labs  01/08/13 0705 01/09/13 0450 01/10/13 0430  LIPASE 75* 17 19         Studies/Results: No results found.  Medications: I have reviewed the patient's current medications.  Assessment/Plan: 1. N+V probably due to ulcerative esophagitis and gastritis.  If tolerates the regular diet today send home on protonix  Bid and f/u with me in office  In 6 weeks   Arneshia Ade JR,Tayron Hunnell L 01/10/2013, 11:42 AM

## 2013-01-10 NOTE — Progress Notes (Signed)
Pt discharge complete, awaiting pt's daughter for transport home.

## 2013-01-15 MED ORDER — SULFAMETHOXAZOLE-TMP DS 800-160 MG PO TABS: 1.0000 | ORAL_TABLET | Freq: Two times a day (BID) | ORAL | Status: DC

## 2013-01-23 ENCOUNTER — Encounter: Payer: Self-pay | Admitting: Cardiology

## 2013-01-23 ENCOUNTER — Ambulatory Visit (INDEPENDENT_AMBULATORY_CARE_PROVIDER_SITE_OTHER): Payer: Medicare Other | Admitting: Cardiology

## 2013-01-23 VITALS — BP 128/70 | HR 74 | Ht 63.0 in | Wt 153.8 lb

## 2013-01-23 DIAGNOSIS — I4891 Unspecified atrial fibrillation: Secondary | ICD-10-CM

## 2013-01-23 NOTE — Progress Notes (Signed)
Carla [name] Date of Birth: December 07, 1921 Medical Record #[id number]  History of Present Illness: Carla [name] is seen today for followup. She was hospitalized in February with atrial fibrillation in the setting of urosepsis. She was placed on amiodarone and converted to normal sinus rhythm. Echocardiogram at that time showed mild right ventricular and right atrial enlargement. There was moderate pulmonary hypertension. More recently she was admitted in early July with nausea vomiting. Upper endoscopy demonstrated a massive hiatal hernia with esophagitis and gastritis. She does have a history of GI bleeding. On admission there was one monitor strip which suggested atrial fibrillation. All other monitor strips demonstrated normal sinus rhythm.  Current Outpatient Prescriptions on File Prior to Visit  Medication Sig Dispense Refill  . acetaminophen (TYLENOL) 500 MG tablet Take 500 mg by mouth every 6 (six) hours as needed for pain.      Marland Kitchen amiodarone (PACERONE) 200 MG tablet Take 0.5 tablets (100 mg total) by mouth daily.  30 tablet  11  . calcium-vitamin D (OSCAL WITH D) 500-200 MG-UNIT per tablet Take 1 tablet by mouth daily.      . cholecalciferol (VITAMIN D) 1000 UNITS tablet Take 1,000 Units by mouth daily.      Marland Kitchen conjugated estrogens (PREMARIN) vaginal cream Place 0.5 Applicatorfuls vaginally 3 (three) times a week. LOT: [id number]   EXP: 08/2013  42.5 g  0  . docusate sodium (COLACE) 100 MG capsule Take 100 mg by mouth 2 (two) times daily as needed for constipation (constipation).      . metoCLOPramide (REGLAN) 5 MG tablet Take 1 tablet (5 mg total) by mouth 4 (four) times daily -  before meals and at bedtime.  120 tablet  3  . Nutritional Supplements (OSTEO ADVANCE) TABS Take 2 tablets by mouth daily.      . pantoprazole (PROTONIX) 40 MG tablet Take 1 tablet (40 mg total) by mouth 2 (two) times daily.  60 tablet  1  . ranitidine (ZANTAC) 150 MG tablet Take 150 mg by mouth 2 (two) times daily  as needed for heartburn (as needed   heartburn).      . sucralfate (CARAFATE) 1 GM/10ML suspension Take 10 mLs (1 g total) by mouth 4 (four) times daily -  with meals and at bedtime.  420 mL  0  . sulfamethoxazole-trimethoprim (BACTRIM DS) 800-160 MG per tablet Take 1 tablet by mouth 2 (two) times daily.  10 tablet  0  . vitamin C (ASCORBIC ACID) 500 MG tablet Take 500 mg by mouth daily.       No current facility-administered medications on file prior to visit.    No Known Allergies  Past Medical History  Diagnosis Date  . Iron deficiency anemia   . High cholesterol   . Osteoarthritis   . Female bladder prolapse   . Osteoporosis   . Cataracts, bilateral   . Gallstones   . Hemorrhoids   . Diverticulosis   . Hiatal hernia   . Atrial fibrillation   . Cystocele   . Rectocele   . Scoliosis   . Lordosis   . Hyperlipidemia   . Osteoarthritis     Past Surgical History  Procedure Laterality Date  . Cholecystectomy    . Cataract extraction    . Esophagogastroduodenoscopy    . Colonoscopy    . Endometrial biopsy  09/2009  . Esophagogastroduodenoscopy N/A 01/09/2013    Procedure: ESOPHAGOGASTRODUODENOSCOPY (EGD);  Surgeon: Vertell Novak., MD;  Location: [facility];  Service:  Endoscopy;  Laterality: N/A;    History  Smoking status  . Never Smoker   Smokeless tobacco  . Never Used    History  Alcohol Use No    Family History  Problem Relation Age of Onset  . Breast cancer Paternal Aunt   . Breast cancer Paternal Aunt   . Cancer      several family members on dad's side of family  . Diabetes Father   . Leukemia Father   . Heart failure Brother     Pt cannot confirm this history    Review of Systems: As noted in history of present illness.  All other systems were reviewed and are negative.  Physical Exam: BP 128/70  Pulse 74  Ht 5\' 3"  (1.6 m)  Wt 153 lb 12.8 oz (69.763 kg)  BMI 27.25 kg/m2  SpO2 95% She is a pleasant white female in no acute distress.  Her HEENT is unremarkable. Lungs: Clear Cardiovascular: Regular rate and rhythm without gallop, murmur, or click. Abdomen: Soft and nontender. Bowel sounds positive. No masses or bruits. Extremities: Pulses are 2+ and symmetric. She has no edema. Neuro: Nonfocal. LABORATORY DATA:   Assessment / Plan: 1. Atrial fibrillation. This initially occurred during an episode of urosepsis. The monitor strip from her recent hospitalization was challenging to interpret. It was a single lead. It was irregular but I'm not sure whether this represented atrial fibrillation or just PACs. At any rate I recommended continuing amiodarone 100 mg at this time. 2. hypertension-controlled.  3. Large hiatal hernia with esophagitis and gastritis. She is a poor candidate for anticoagulation.

## 2013-01-23 NOTE — Patient Instructions (Signed)
Continue your current therapy  I will see you in 6 months.   

## 2013-01-30 ENCOUNTER — Ambulatory Visit: Payer: Medicare Other | Admitting: Nurse Practitioner

## 2013-02-06 ENCOUNTER — Encounter: Payer: Self-pay | Admitting: Nurse Practitioner

## 2013-02-06 ENCOUNTER — Ambulatory Visit (INDEPENDENT_AMBULATORY_CARE_PROVIDER_SITE_OTHER): Payer: Medicare Other | Admitting: Nurse Practitioner

## 2013-02-06 VITALS — BP 120/64 | HR 72 | Ht 62.5 in | Wt 152.0 lb

## 2013-02-06 DIAGNOSIS — N813 Complete uterovaginal prolapse: Secondary | ICD-10-CM

## 2013-02-06 NOTE — Progress Notes (Deleted)
Subjective:     Patient ID: Carla [name], female   DOB: July 15, 1921, 79 y.o.   MRN: [id number]  HPI   Catheter out 4-6 weks.  Going  To bathroom to void every two hours. sosmetimes no much every time. But has to press on pessary to start and empty bladder.  Even on 2 hour schedule at night. Review of Systems     Objective:   Physical Exam     Assessment:     ***    Plan:     ***

## 2013-02-06 NOTE — Patient Instructions (Signed)
Continue with Premarin vaginal cream with pessary

## 2013-02-07 ENCOUNTER — Encounter: Payer: Self-pay | Admitting: Nurse Practitioner

## 2013-02-07 NOTE — Progress Notes (Signed)
79 y.o. Widowed White female [phone] here for pessary check.  Patient has been using following pessary style and size:  5 short stem Gellhorn Pessary. .  She is not sexually active.  She describes the following issues with the pessary:  She has to push upward on the pessary to void.  This is a new problem noted after the catheter was removed about 4-6 weeks ago. She is on a bladder training program and tries to go every 2 hours even during the night.  ROS: no side effects of hormonal medications, no vaginal bleeding, no discharge or pelvic pain, no dysuria or hematuria.  Another hospital stay due to N/V on 01/08/13.  Exam:   BP 120/64  Pulse 72  Ht 5' 2.5" (1.588 m)  Wt 152 lb (68.947 kg)  BMI 27.34 kg/m2 General appearance: alert, cooperative, fatigued and looks better than at last 2 visits.  She is still not herself and is no longer voluntering at hospital. Daughter stays with her much of the time now. Inguinal adenopathy: negative   Pelvic: External genitalia:  no lesions              Urethra: normal appearing urethra with no masses, tenderness or lesions              Bartholin's and Skene's: normal                 Vagina: atrophic, vaginal erythema and usual erosion around the cervix without excoriation. Light pink discharge after removal more from the trauma than at any lesion.               Cervix: normal appearance Bimanual Exam:  Uterus:  uterus is normal size, shape, consistency and non tender                               Adnexa:    not indicated and normal adnexa in size, non tender and no masses                               Anus:  defer exam  Pessary was removed with difficulty and used forceps.  Pessary was cleansed but then realized we had ordered another due to the poor condition of this one.  New pessary was obtained and inserted with use of Premarin vagina cream.  Pessary was replaced. Patient tolerated procedure well.  She did void incontinent with removal of the pessary.  A:   Cystocele- symptomatic  Recent episode of Afib and hospitalization secondary to N/V 2/14 and urosepsis.  Another hospitalization on 01/08/13 again with N/V secondary to ulcerative esophagitis and hiatal hernia        P:   Return to office in 2 months for recheck.        Call back if any problems wit pessary.  [name] her daughter is with her today.    Charges for the pessary were entered by Arna Medici  An After Visit Summary was printed and given to the patient.

## 2013-02-08 NOTE — Progress Notes (Signed)
Encounter reviewed by Dr. Brook Silva.  

## 2013-02-22 ENCOUNTER — Other Ambulatory Visit: Payer: Self-pay | Admitting: *Deleted

## 2013-02-22 MED ORDER — AMIODARONE HCL 200 MG PO TABS: 100.0000 mg | ORAL_TABLET | Freq: Every day | ORAL | Status: DC

## 2013-04-06 ENCOUNTER — Ambulatory Visit (HOSPITAL_COMMUNITY): Payer: Medicare Other | Attending: Nephrology

## 2013-04-06 DIAGNOSIS — N289 Disorder of kidney and ureter, unspecified: Secondary | ICD-10-CM | POA: Insufficient documentation

## 2013-04-06 DIAGNOSIS — N179 Acute kidney failure, unspecified: Secondary | ICD-10-CM

## 2013-04-06 DIAGNOSIS — N189 Chronic kidney disease, unspecified: Secondary | ICD-10-CM

## 2013-04-06 DIAGNOSIS — N19 Unspecified kidney failure: Secondary | ICD-10-CM | POA: Insufficient documentation

## 2013-04-06 DIAGNOSIS — E785 Hyperlipidemia, unspecified: Secondary | ICD-10-CM | POA: Insufficient documentation

## 2013-04-09 ENCOUNTER — Ambulatory Visit (INDEPENDENT_AMBULATORY_CARE_PROVIDER_SITE_OTHER): Payer: Medicare Other | Admitting: Nurse Practitioner

## 2013-04-09 ENCOUNTER — Encounter: Payer: Self-pay | Admitting: Nurse Practitioner

## 2013-04-09 VITALS — BP 130/64 | HR 76 | Ht 62.5 in | Wt 150.0 lb

## 2013-04-09 DIAGNOSIS — N8111 Cystocele, midline: Secondary | ICD-10-CM

## 2013-04-09 DIAGNOSIS — IMO0002 Reserved for concepts with insufficient information to code with codable children: Secondary | ICD-10-CM

## 2013-04-09 DIAGNOSIS — N816 Rectocele: Secondary | ICD-10-CM

## 2013-04-09 MED ORDER — ESTROGENS, CONJUGATED 0.625 MG/GM VA CREA: 1.0000 g | TOPICAL_CREAM | VAGINAL | Status: DC

## 2013-04-09 NOTE — Progress Notes (Signed)
79 y.o. Widowed White female [phone] here for pessary check.  Patient has been using following pessary style and size:  5 short stem Gellhorn.  She is not sexually active.  She describes the following issues with the pessary:  None, she is no longer having to do catheterization.  She does have some stress incontinence but not bad.  ROS: no side effects of hormonal medications, no vaginal bleeding, no discharge or pelvic pain, no dysuria, trouble voiding or hematuria.   She had recent repeat of renal function test and they were improved.  Exam:   BP 130/64  Pulse 76  Ht 5' 2.5" (1.588 m)  Wt 150 lb (68.04 kg)  BMI 26.98 kg/m2 General appearance: alert, cooperative and since last hopitalization she is not as active as before Inguinal adenopathy: none   Pelvic: External genitalia:  no lesions              Urethra: normal appearing urethra with no masses, tenderness or lesions              Bartholin's and Skene's: normal                 Vagina: usual area of excoriation is found on left side of cervix but not friable.              Cervix: normal appearance Bimanual Exam:  Uterus:  uterus is normal size, shape, consistency and non tender                               Adnexa:    not indicated                               Anus:  defer exam  Pessary was removed with difficulty using ring forceps.  Pessary was cleansed.  Pessary was replaced. Patient tolerated procedure well.    A:  Mixed UA  incontinence,   Cystocele- symptomatic  Rectocele- symptomatic  Use of Gellhorn size 5 short stem pessary  Recent Hospitalization for acute renal insufficiency and urinary sepsis 08/2012.         P:   Return to office in 2 months for recheck.  Refill of Premarin vaginal cream 0.5 mg three times weekly            An After Visit Summary was printed and given to the patient.

## 2013-04-10 ENCOUNTER — Encounter (HOSPITAL_BASED_OUTPATIENT_CLINIC_OR_DEPARTMENT_OTHER): Payer: Medicare Other

## 2013-04-11 NOTE — Progress Notes (Signed)
Encounter reviewed by Dr. Brook Silva.  

## 2013-04-23 DIAGNOSIS — E039 Hypothyroidism, unspecified: Secondary | ICD-10-CM | POA: Insufficient documentation

## 2013-06-12 ENCOUNTER — Encounter: Payer: Self-pay | Admitting: Nurse Practitioner

## 2013-06-12 ENCOUNTER — Ambulatory Visit (INDEPENDENT_AMBULATORY_CARE_PROVIDER_SITE_OTHER): Payer: Medicare Other | Admitting: Nurse Practitioner

## 2013-06-12 VITALS — BP 124/72 | HR 76 | Ht 62.5 in | Wt 157.0 lb

## 2013-06-12 DIAGNOSIS — B369 Superficial mycosis, unspecified: Secondary | ICD-10-CM

## 2013-06-12 DIAGNOSIS — IMO0002 Reserved for concepts with insufficient information to code with codable children: Secondary | ICD-10-CM

## 2013-06-12 DIAGNOSIS — N8111 Cystocele, midline: Secondary | ICD-10-CM

## 2013-06-12 DIAGNOSIS — B49 Unspecified mycosis: Secondary | ICD-10-CM

## 2013-06-12 MED ORDER — NYSTATIN-TRIAMCINOLONE 100000-0.1 UNIT/GM-% EX OINT: 1.0000 "application " | TOPICAL_OINTMENT | Freq: Two times a day (BID) | CUTANEOUS | Status: DC

## 2013-06-12 NOTE — Patient Instructions (Signed)
Return in 2 months.  Continue to use hormone cream 2 times a week.  Use cream for rash externally 2 times daily until better.

## 2013-06-12 NOTE — Progress Notes (Signed)
79 y.o. Widowed White female [phone] here for pessary check.  Patient has been using following pessary style and size:  5 short stem Gellhorn.  She is not sexually active.  She describes the following issues with the pessary:  None.    ROS: no breast pain or new or enlarging lumps on self exam, no side effects of hormonal medications, no dysuria, trouble voiding or hematuria Voids incontinent with change of positions.   She is having an increase in right knee pain about 2 months following an outing where she had to walk some distance.  Exam:   BP 124/72  Pulse 76  Ht 5' 2.5" (1.588 m)  Wt 157 lb (71.215 kg)  BMI 28.24 kg/m2 General appearance: alert, cooperative, appears stated age and no distress Inguinal adenopathy: negative  Right leg:  There is an area that is tender behind the right knee that may be a Bakers Cyst. She has no tenderness in the calf and Homans sign is negative.  Pelvic: External genitalia:  Several areas of redness and what appears to be irritation from wearing pads and Depends.  Looks very much like yeast.              Urethra: prolapse.              Bartholin's and Skene's: normal                 Vagina: vaginal erythema at the left of cervix which is her usual area of irritation but not excoriated, after removal of pessary there was a small amount of feces in the vagina and small amount of dried on the pessary.  This was cleaned and scrubbed with Betadine.              Cervix: lesions, as noted area of irritaion on left of cervix. Bimanual Exam:  Uterus:  deferred                               Adnexa:    not indicated                               Anus:  defer exam  Pessary was removed with difficulty using a ring forceps.  Pessary was cleansed.  Pessary was replaced. Patient tolerated procedure well.    A:  Mixed UA incontinence   Cystocele- symptomatic  Use of Gellhorn size 5 short stem Pessary       History of renal insufficiency  External Yeast  Vulvovaginitis  P:   Return to office in 2 months for recheck.        Rx for triamcinolone and Nystatin to use externally for probable yeast infection    An After Visit Summary was printed and given to the patient.

## 2013-06-15 NOTE — Progress Notes (Signed)
Encounter reviewed by Dr. Brook Silva.  

## 2013-08-02 ENCOUNTER — Encounter: Payer: Self-pay | Admitting: Cardiology

## 2013-08-02 ENCOUNTER — Ambulatory Visit (INDEPENDENT_AMBULATORY_CARE_PROVIDER_SITE_OTHER): Payer: Medicare Other | Admitting: Cardiology

## 2013-08-02 VITALS — BP 142/62 | HR 79 | Ht 62.5 in | Wt 152.1 lb

## 2013-08-02 DIAGNOSIS — R946 Abnormal results of thyroid function studies: Secondary | ICD-10-CM

## 2013-08-02 DIAGNOSIS — R7989 Other specified abnormal findings of blood chemistry: Secondary | ICD-10-CM

## 2013-08-02 DIAGNOSIS — I4891 Unspecified atrial fibrillation: Secondary | ICD-10-CM

## 2013-08-02 NOTE — Patient Instructions (Signed)
Continue your current therapy  I will see you in 6 months.   

## 2013-08-02 NOTE — Progress Notes (Signed)
Carla [name] Date of Birth: [birth year] Medical Record #[id number]  History of Present Illness: Carla [name] is seen today for followup. Carla [name] was hospitalized in February 2014 with atrial fibrillation in the setting of urosepsis. Carla [name] was placed on amiodarone and converted to normal sinus rhythm. Echocardiogram at that time showed mild right ventricular and right atrial enlargement. There was moderate pulmonary hypertension. In July 2014  Upper endoscopy demonstrated a massive hiatal hernia with esophagitis and gastritis. Carla [name] does have a history of GI bleeding. Carla [name] has been started on synthroid for hypothyroidism. Carla [name] denies any chest pain, SOB, or palpitations. Mild "shaking". Followed by [facility] with recent creatinine of 1.37.  Current Outpatient Prescriptions on File Prior to Visit  Medication Sig Dispense Refill  . acetaminophen (TYLENOL) 500 MG tablet Take 500 mg by mouth every 6 (six) hours as needed for pain.      Marland Kitchen. amiodarone (PACERONE) 200 MG tablet Take 0.5 tablets (100 mg total) by mouth daily.  30 tablet  6  . calcium-vitamin D (OSCAL WITH D) 500-200 MG-UNIT per tablet Take 1 tablet by mouth daily.      . cholecalciferol (VITAMIN D) 1000 UNITS tablet Take 1,000 Units by mouth daily.      Marland Kitchen. conjugated estrogens (PREMARIN) vaginal cream Place 0.5 Applicatorfuls vaginally 3 (three) times a week.  42.5 g  3  . docusate sodium (COLACE) 100 MG capsule Take 100 mg by mouth 2 (two) times daily as needed for constipation (constipation).      Marland Kitchen. levothyroxine (SYNTHROID, LEVOTHROID) 50 MCG tablet Take 1 tablet by mouth daily.      Marland Kitchen. nystatin-triamcinolone ointment (MYCOLOG) Apply 1 application topically 2 (two) times daily.  60 g  1  . pantoprazole (PROTONIX) 40 MG tablet Take 1 tablet (40 mg total) by mouth 2 (two) times daily.  60 tablet  1  . vitamin C (ASCORBIC ACID) 500 MG tablet Take 500 mg by mouth daily.       No current facility-administered medications on file prior to visit.     No Known Allergies  Past Medical History  Diagnosis Date  . Iron deficiency anemia   . High cholesterol   . Osteoarthritis   . Female bladder prolapse   . Osteoporosis   . Cataracts, bilateral   . Gallstones   . Hemorrhoids   . Diverticulosis   . Hiatal hernia   . Atrial fibrillation   . Cystocele   . Rectocele   . Scoliosis   . Lordosis   . Hyperlipidemia   . Osteoarthritis     Past Surgical History  Procedure Laterality Date  . Cholecystectomy    . Cataract extraction    . Esophagogastroduodenoscopy    . Colonoscopy    . Endometrial biopsy  09/2009  . Esophagogastroduodenoscopy N/A 01/09/2013    Procedure: ESOPHAGOGASTRODUODENOSCOPY (EGD);  Surgeon: Vertell NovakJames L Edwards Jr., MD;  Location: [facility];  Service: Endoscopy;  Laterality: N/A;    History  Smoking status  . Never Smoker   Smokeless tobacco  . Never Used    History  Alcohol Use No    Family History  Problem Relation Age of Onset  . Breast cancer Paternal Aunt   . Breast cancer Paternal Aunt   . Cancer      several family members on dad's side of family  . Diabetes Father   . Leukemia Father   . Heart failure Brother     Pt cannot confirm this history    Review  of Systems: As noted in history of present illness.  All other systems were reviewed and are negative.  Physical Exam: BP 142/62  Pulse 79  Ht 5' 2.5" (1.588 m)  Wt 152 lb 1.9 oz (69.001 kg)  BMI 27.36 kg/m2 Carla [name] is a pleasant white female in no acute distress. Her HEENT is unremarkable. Lungs: Clear Cardiovascular: Regular rate and rhythm without gallop, murmur, or click. Abdomen: Soft and nontender. Bowel sounds positive. No masses or bruits. Extremities: Pulses are 2+ and symmetric. Carla [name] has no edema. Neuro: Nonfocal. LABORATORY DATA:   Assessment / Plan: 1. Atrial fibrillation. This initially occurred during an episode of urosepsis. Given increased risk of bleeding on anticoagulation with GI findings I recommend trying  to maintain NSR with amiodarone as the best way to reduce risk of CVA. Carla [name] is tolerating this well. Now on thyroid replacement. Follow up in 6 months. 2. Hypertension-controlled.  3. Large hiatal hernia with esophagitis and gastritis. Carla [name] is a poor candidate for anticoagulation.

## 2013-08-15 ENCOUNTER — Encounter: Payer: Self-pay | Admitting: Nurse Practitioner

## 2013-08-15 ENCOUNTER — Ambulatory Visit (INDEPENDENT_AMBULATORY_CARE_PROVIDER_SITE_OTHER): Payer: Medicare Other | Admitting: Nurse Practitioner

## 2013-08-15 VITALS — BP 120/64 | HR 72 | Ht 62.5 in | Wt 160.0 lb

## 2013-08-15 DIAGNOSIS — N813 Complete uterovaginal prolapse: Secondary | ICD-10-CM

## 2013-08-15 DIAGNOSIS — N8111 Cystocele, midline: Secondary | ICD-10-CM

## 2013-08-15 DIAGNOSIS — IMO0002 Reserved for concepts with insufficient information to code with codable children: Secondary | ICD-10-CM

## 2013-08-15 NOTE — Progress Notes (Signed)
Subjective:   80 y.o. Widowed White female [phone] here for pessary check.  Patient has been using following pessary style and size:  Size 5 short stem Gellhorn.  She describes the following issues with the pessary:  vaginal odor.   Still has some incontinence when changes position.   Use of protective clothing such as Depends or pads yes. Problems with protective clothing with rash yes using Mycolog cream prn.  Constipation issues with use of pessary no.  She is not sexually active.     ROS:   No breast pain or new or enlarging lumps on self exam,  no abnormal bleeding, pelvic pain or discharge, no dysuria, trouble voiding or hematuria   No dysuria, trouble voiding or hematuria. Compliant to use of vaginal cream Yes.   General Exam:    BP 120/64  Pulse 72  Ht 5' 2.5" (1.588 m)  Wt 160 lb (72.576 kg)  BMI 28.78 kg/m2  General appearance: alert, cooperative and appears stated age   Pelvic: External genitalia:  irritation from pad wear but no break in skin    Before pessary was removed no prolapse over the pessary   In correct position yes              Urethra: normal appearing urethra with no masses, tenderness or lesions              Vagina: normal appearing vagina with normal color and discharge, no lesions, stool again is present at the edge of the pessary.  Vaginal vault was cleaned.  There are No abrasions or ulcerations.               Cervix: normal appearance Cervical lesions were not found   Bimanual Exam:  Uterus:  not examined"uterus is normal size, shape, consistency and non tender"}                               Adnexa:    not indicated                             Pessary was removed with difficulty with using forceps.  Pessary was cleansed with Betadine.  Pessary was replaced. Patient tolerated procedure well.    Assement :  SUI, Urge incontinence,    Cystocele- symptomatic,    Rectocele- symptomatic       stool in vaginal vault   Use of pessary continued   OA of right  shoulder that prevents her from doing good    hygiene after BM's   Plan:    Return to office in 2 1/2 months for recheck.         She will use Metrogel vaginal cream that she has on hand at home for the vaginal odor that is most likely related to stool around the introitus.  She will CB if symptoms persists.  An After Visit Summary was printed and given to the patient.

## 2013-08-15 NOTE — Patient Instructions (Signed)
Use Metrogel at home for the next 4-5 nights to help with odor

## 2013-08-21 NOTE — Progress Notes (Signed)
Encounter reviewed by Dr. Philippe Gang Silva.  

## 2013-10-23 ENCOUNTER — Ambulatory Visit (INDEPENDENT_AMBULATORY_CARE_PROVIDER_SITE_OTHER): Payer: Medicare Other | Admitting: Nurse Practitioner

## 2013-10-23 ENCOUNTER — Encounter: Payer: Self-pay | Admitting: Nurse Practitioner

## 2013-10-23 VITALS — BP 110/72 | HR 80 | Ht 61.5 in | Wt 163.2 lb

## 2013-10-23 DIAGNOSIS — N816 Rectocele: Secondary | ICD-10-CM

## 2013-10-23 DIAGNOSIS — IMO0002 Reserved for concepts with insufficient information to code with codable children: Secondary | ICD-10-CM

## 2013-10-23 DIAGNOSIS — N8111 Cystocele, midline: Secondary | ICD-10-CM

## 2013-10-23 MED ORDER — METRONIDAZOLE 0.75 % VA GEL: VAGINAL | Status: DC

## 2013-10-23 MED ORDER — NYSTATIN-TRIAMCINOLONE 100000-0.1 UNIT/GM-% EX OINT: 1.0000 "application " | TOPICAL_OINTMENT | Freq: Two times a day (BID) | CUTANEOUS | Status: DC

## 2013-10-23 NOTE — Progress Notes (Signed)
Subjective:   80 y.o. Widowed White female [phone] here for pessary check.  Patient has been using following pessary style and size:  Size 5 short stem Gellhorn.  She describes the following issues with the pessary:  None other than when having a bowel movement if it is loose has residual stool that gets into the vagina. She is able to check her urine at home and no signs of infection.   Use of protective clothing such as Depends or pads yes. Problems with protective clothing with rash yes.  Constipation issues with use of pessary no.  She is not sexually active.     ROS:   no breast pain or new or enlarging lumps on self exam,  no abnormal bleeding, pelvic pain or discharge,   no dysuria, trouble voiding or hematuria. Compliant to use of vaginal cream Yes.   General Exam:    BP 110/72  Pulse 80  Ht 5' 1.5" (1.562 m)  Wt 163 lb 3.2 oz (74.027 kg)  BMI 30.34 kg/m2  General appearance: alert, cooperative and appears stated age   Pelvic: External genitalia:  Slight irritation from wearing depends   Before pessary was removed no prolapse over the pessary   In correct position yes              Urethra: normal appearing urethra with no masses, tenderness or lesions              Vagina: normal appearing vagina with normal color and discharge, no lesions, there is quite a bit of stool at the borders of where the pessary was.  Area was cleaned and then swabbed with betadine. There are No abrasions or ulcerations.               Cervix: normal appearance Cervical lesions were not found   Bimanual Exam:  Uterus:  normal size, contour, position, consistency, mobility, non-tender and anteverted"uterus is normal size, shape, consistency and non tender"}                               Adnexa:    not indicated                             Pessary was removed with difficulty with using forceps.  Pessary was cleansed with Betadine.  Pessary was replaced. Patient tolerated procedure well.    Assement :  SUI,  Urge incontinence, Cystocele- symptomatic        Rectocele symptomatic   Use of pessary continued   Plan:    Return to office in 2 months for recheck.         Refill on Mycolog to use externally prn     Will have her to get Trimosan 1/2 -1 applicator prn for odor.  If unable to find she is given Metrogel    An After Visit Summary was printed and given to the patient

## 2013-10-23 NOTE — Patient Instructions (Signed)
OTC Trimosan 1/2 applicator 1-2 times a week as needed

## 2013-10-27 NOTE — Progress Notes (Signed)
Encounter reviewed by Dr. Conley SimmondsBrook Silva.  I would start patient on daily metamucil to assist with bowel function. Changing pessary to a ring with support may prevent fecal soiling in the vaginal area. Colpocleisis could be a consideration if the prolapse is becoming difficult to manage with a pessary.

## 2013-12-25 ENCOUNTER — Encounter: Payer: Self-pay | Admitting: Nurse Practitioner

## 2013-12-25 ENCOUNTER — Ambulatory Visit (INDEPENDENT_AMBULATORY_CARE_PROVIDER_SITE_OTHER): Payer: Medicare Other | Admitting: Nurse Practitioner

## 2013-12-25 VITALS — BP 114/76 | HR 68 | Ht 61.5 in | Wt 163.0 lb

## 2013-12-25 DIAGNOSIS — N3946 Mixed incontinence: Secondary | ICD-10-CM

## 2013-12-25 NOTE — Progress Notes (Signed)
Subjective:   80 y.o. Widowed White female [phone] here for pessary check.  Patient has been using following pessary style and size:  Size 5 short stem Gellhorn.  She describes the following issues with the pessary:  none. The family is going to get together this fall for her [age] birthday - [place] and she does not want to go.  Use of protective clothing such as Depends or pads yes. Problems with protective clothing with rash yes.  Constipation issues with use of pessary yes.  She has noted maybe a slight increase in urinary incontinence despite the pessary use.  Some lower abdominal cramps but thinks this is related to constipation issues today.  Denies fever and chills.  She is not sexually active.     ROS:   no breast pain or new or enlarging lumps on self exam,  no abnormal bleeding, pelvic pain or discharge, positive for - urinary frequency/urgency   no dysuria, trouble voiding or hematuria. Compliant to use of vaginal cream Yes.   General Exam:    BP 114/76  Pulse 68  Ht 5' 1.5" (1.562 m)  Wt 163 lb (73.936 kg)  BMI 30.30 kg/m2  General appearance: alert, cooperative and appears stated age   Pelvic: External genitalia:  irritated from protective wear   Sterile In/out cath was done with 5 cc urine collected and sent for C&S   Before pessary was removed no prolapse over the pessary   In correct position yes              Urethra: normal appearing urethra with no masses, tenderness or lesions              Vagina: normal appearing vagina with normal color and discharge, no lesions.  There are no abrasions or ulcerations.  She did have stool in the vaginal vault but less this time than before.               Cervix: usual redness at the cervical edges where the pessary fits without bleeding. Cervical lesions were found   Bimanual Exam:  Uterus:  not examined"uterus is normal size, shape, consistency and non tender"}                               Adnexa:    not  indicated                             Pessary was removed with difficulty with using forceps.  Pessary was cleansed with Betadine.  Pessary was replaced. Patient tolerated procedure well.    Assessment :  SUI, Urge incontinence, Cystocele- symptomatic, Rectocele- symptomatic   Constipation issues   Use of pessary continued   R/O UTI   Plan:   Return to office in 2 months for recheck.        Continue to use Mycolog cream externally  Prn   Discussed use of stool softeners such as Metamucil, Colace, etc     Will call her for results of urine culture   An After Visit Summary was printed and given to the patient.

## 2013-12-25 NOTE — Patient Instructions (Signed)
Recheck in 2 months.

## 2013-12-28 ENCOUNTER — Telehealth: Payer: Self-pay | Admitting: *Deleted

## 2013-12-28 LAB — URINE CULTURE: Colony Count: >=100000

## 2013-12-28 NOTE — Telephone Encounter (Signed)
I spoke to the patient and she is agreeable to this plan.  She states she needs clearance from Dr. Arrie Aranoladonato for any medications. I [facility] a message for [facility], at Dr. Hadley Penoladonato's office to return call to let us know if Macrobid will be OK. Advised to ask for me or triage nurse.  Medication has NOT been sent to pharmacy.  [facility].

## 2013-12-28 NOTE — Telephone Encounter (Signed)
Message copied by Luisa DagoPHILLIPS, STEPHANIE C on Fri Dec 28, 2013 10:29 AM ------      Message from: Jerene BearsMILLER, MARY S      Created: Fri Dec 28, 2013  6:23 AM       Inform urine culture did not show a specific bacteria but was positive for a lot of bacteria.  Should treat with Macrobid 100mg  bid x 7 days.  Please notify pt and call in.  No repeat culture needed.  Should have 2 month f/u scheduled. ------

## 2013-12-31 MED ORDER — NITROFURANTOIN MONOHYD MACRO 100 MG PO CAPS
100.0000 mg | ORAL_CAPSULE | Freq: Two times a day (BID) | ORAL | Status: DC
Start: 2013-12-31 — End: 2014-01-09

## 2013-12-31 NOTE — Telephone Encounter (Signed)
Spoke with Corrie DandyMary at [facility]. States that Dr.Colandonato states that patient should not take Bactrim or Septra because they interfere with creatinine secretion but patient is okay to take Macrobid. Randa Lynnhanked Mary for return call and update. Advised would notify Dr.Miller and patient.  Spoke with patient. Advised of message from Dr.Colandonato's office. Advised rx sent to pharmacy on file for Macrobid 100mg  BID x 7 days. Patient agreeable and verbalizes understanding.  Routing to provider for final review. Patient agreeable to disposition. Will close encounter

## 2013-12-31 NOTE — Telephone Encounter (Signed)
Patient called and left a message after hours on the answering machine at 4:39 PM on 12/28/13 inquiring about an RX for a bladder infection.

## 2013-12-31 NOTE — Telephone Encounter (Signed)
Patient calling to check on status of call. °

## 2013-12-31 NOTE — Progress Notes (Signed)
Encounter reviewed by Dr. Brook Silva.  

## 2013-12-31 NOTE — Telephone Encounter (Addendum)
Patient has been waiting to hear back from Dr. Hadley Penoladonato's office (Nephrology).  Our office has not received return call from their office as well.  Patient is calling wanting to know if she should take medicine as it has been one week.  Urine was obtained via sterile catheter collection.  Left message with Corrie DandyMary at Dr. Hadley Penoladonato's office for return  Patient denies symptoms. Denies fevers, back pain, blood in urine, dysuria.  Dr. Hyacinth MeekerMiller, should patient continue with Macrobid as ordered? No recent labs in Epic with kidney function.

## 2014-01-09 ENCOUNTER — Ambulatory Visit: Payer: Medicare Other | Admitting: Cardiology

## 2014-01-09 ENCOUNTER — Encounter: Payer: Self-pay | Admitting: Cardiology

## 2014-01-09 ENCOUNTER — Ambulatory Visit (INDEPENDENT_AMBULATORY_CARE_PROVIDER_SITE_OTHER): Payer: Medicare Other | Admitting: Cardiology

## 2014-01-09 VITALS — BP 138/86 | HR 70 | Ht 61.5 in | Wt 161.4 lb

## 2014-01-09 DIAGNOSIS — I4891 Unspecified atrial fibrillation: Secondary | ICD-10-CM

## 2014-01-09 DIAGNOSIS — E7800 Pure hypercholesterolemia, unspecified: Secondary | ICD-10-CM

## 2014-01-09 DIAGNOSIS — R7989 Other specified abnormal findings of blood chemistry: Secondary | ICD-10-CM

## 2014-01-09 DIAGNOSIS — I480 Paroxysmal atrial fibrillation: Secondary | ICD-10-CM

## 2014-01-09 NOTE — Progress Notes (Signed)
Carla FellerEarline S [name] Date of Birth: 1921-12-17 Medical Record #[id number]  History of Present Illness: Carla [name] is seen today for followup. She was hospitalized in February 2014 with atrial fibrillation in the setting of urosepsis. She was placed on amiodarone and converted to normal sinus rhythm. Echocardiogram at that time showed mild right ventricular and right atrial enlargement. There was moderate pulmonary hypertension. In July 2014  Upper endoscopy demonstrated a massive hiatal hernia with esophagitis and gastritis. She does have a history of GI bleeding. She has been started on synthroid for hypothyroidism. She denies any chest pain, SOB, or palpitations. She is not felt to be a candidate for anticoagulation due to GI bleed. She denies any palpitations. She gets a little winded walking to the mailbox. No chest pain. No edema.  Current Outpatient Prescriptions on File Prior to Visit  Medication Sig Dispense Refill  . amiodarone (PACERONE) 200 MG tablet Take 0.5 tablets (100 mg total) by mouth daily.  30 tablet  6  . conjugated estrogens (PREMARIN) vaginal cream Place 0.5 Applicatorfuls vaginally 3 (three) times a week.  42.5 g  3  . docusate sodium (COLACE) 100 MG capsule Take 100 mg by mouth 2 (two) times daily as needed for constipation (constipation).      . nystatin-triamcinolone ointment (MYCOLOG) Apply 1 application topically 2 (two) times daily.  60 g  4   No current facility-administered medications on file prior to visit.    No Known Allergies  Past Medical History  Diagnosis Date  . Iron deficiency anemia   . High cholesterol   . Osteoarthritis   . Female bladder prolapse   . Osteoporosis   . Cataracts, bilateral   . Gallstones   . Hemorrhoids   . Diverticulosis   . Hiatal hernia   . Atrial fibrillation   . Cystocele   . Rectocele   . Scoliosis   . Lordosis   . Hyperlipidemia   . Osteoarthritis     Past Surgical History  Procedure Laterality Date  .  Cholecystectomy    . Cataract extraction    . Esophagogastroduodenoscopy    . Colonoscopy    . Endometrial biopsy  09/2009  . Esophagogastroduodenoscopy N/A 01/09/2013    Procedure: ESOPHAGOGASTRODUODENOSCOPY (EGD);  Surgeon: Vertell NovakJames L Edwards Jr., MD;  Location[facility];  Service: Endoscopy;  Laterality: N/A;    History  Smoking status  . Never Smoker   Smokeless tobacco  . Never Used    History  Alcohol Use No    Family History  Problem Relation Age of Onset  . Breast cancer Paternal Aunt   . Breast cancer Paternal Aunt   . Cancer      several family members on dad's side of family  . Diabetes Father   . Leukemia Father   . Heart failure Brother     Pt cannot confirm this history    Review of Systems: As noted in history of present illness.  All other systems were reviewed and are negative.  Physical Exam: BP 138/86  Pulse 70  Ht 5' 1.5" (1.562 m)  Wt 161 lb 6.4 oz (73.211 kg)  BMI 30.01 kg/m2 She is a pleasant white female in no acute distress. HEENT is unremarkable. Lungs: Clear Cardiovascular: Regular rate and rhythm without gallop, murmur, or click. Abdomen: Soft and nontender. Bowel sounds positive. No masses or bruits. Extremities: Pulses are 2+ and symmetric. She has no edema. Neuro: Nonfocal.  LABORATORY DATA: Ecg: NSR with Normal Ecg.  Labs  reviewed from Dr. Fortunato CurlingBadger's office under CareEverywhere. Creatinine 1.25. TSH is normal. LFTs are normal.  Assessment / Plan: 1. Atrial fibrillation. This initially occurred during an episode of urosepsis. Given increased risk of bleeding on anticoagulation with GI findings I recommend trying to maintain NSR with amiodarone as the best way to reduce risk of CVA. She is tolerating this well. Continue 100 mg daily. Follow up in 6 months. 2. Hypertension-controlled.  3. Large hiatal hernia with esophagitis and gastritis. She is a poor candidate for anticoagulation.

## 2014-01-09 NOTE — Patient Instructions (Signed)
Continue your current therapy  I will see you in 6 months.   

## 2014-02-21 DIAGNOSIS — K219 Gastro-esophageal reflux disease without esophagitis: Secondary | ICD-10-CM | POA: Insufficient documentation

## 2014-02-22 ENCOUNTER — Other Ambulatory Visit: Payer: Self-pay | Admitting: *Deleted

## 2014-02-22 MED ORDER — AMIODARONE HCL 200 MG PO TABS: 100.0000 mg | ORAL_TABLET | Freq: Every day | ORAL | Status: DC

## 2014-02-25 ENCOUNTER — Ambulatory Visit (INDEPENDENT_AMBULATORY_CARE_PROVIDER_SITE_OTHER): Payer: Medicare Other | Admitting: Nurse Practitioner

## 2014-02-25 ENCOUNTER — Encounter: Payer: Self-pay | Admitting: Nurse Practitioner

## 2014-02-25 VITALS — BP 120/82 | HR 68 | Ht 61.5 in | Wt 167.0 lb

## 2014-02-25 DIAGNOSIS — Z4689 Encounter for fitting and adjustment of other specified devices: Secondary | ICD-10-CM

## 2014-02-25 DIAGNOSIS — N390 Urinary tract infection, site not specified: Secondary | ICD-10-CM

## 2014-02-25 NOTE — Progress Notes (Signed)
Subjective:   80 y.o. Widowed White female [phone] here for pessary check.  Patient has been using following pessary style and size:  Size 5 short stem Gellhorn..  She describes the following issues with the pessary:  None.  She has had a change of plans from going to [place] for her 82 th Birthday.  The daughter who lives in [place] has had a recurrence of her Pancreatic cancer and the entire family is flying there to have an early [place] party and to support the daughter.   Use of protective clothing such as Depends or pads Yes.  . Problems with protective clothing with rash No..  Constipation issues with use of pessary No.   New pessary was ordered but is the wrong size.  She is not sexually active.     ROS:   no breast pain or new or enlarging lumps on self exam,  no abnormal bleeding, pelvic pain or discharge,   no dysuria, trouble voiding or hematuria.  She does want to repeat a urine culture since she had UTI at last visit.  C&S showed mixed bacteria and was treated with Macrobid.  The Urologist is no longer following her so she wants to make sure we are checking her urine especially since she will have a trip coming up in a month.   Compliant to use of vaginal cream Yes - most of time.   General Exam:    BP 120/82  Pulse 68  Ht 5' 1.5" (1.562 m)  Wt 167 lb (75.751 kg)  BMI 31.05 kg/m2  General appearance: alert, cooperative and appears stated age   Pelvic: External genitalia:  no lesions   Before pessary was removed No. prolapse over the pessary   In correct position Yes.                Urethra: reddish purple  appearing urethra with no masses, tenderness or lesions.  In / Out Catheter  Specimen collected for C&S.                Vagina: normal appearing vagina with normal color and discharge, no lesions, except one area on the right side of cervix which is normal for her.  There are No abrasions or ulcerations.  Much less stool in the vaginal vault               Cervix: normal  appearance Cervical lesions were found   Bimanual Exam:  Uterus:  not examined                               Adnexa:    not indicated                             Pessary was removed with difficulty with using forceps.  Pessary was cleansed with Betadine.  Pessary was replaced. Patient tolerated procedure well.  New pessary not inserted as it was the wrong size.  Assessment : No contraindication to continuing hormonal vaginal cream    SUI, Urge incontinence      TOC for UTI   Use of pessary continued   Plan:   Return to office in 2 months for recheck.        Will follow with urine culture results   Will continue with Mycolog externally   Will continue with premarin vaginal cream twice weekly  An After Visit Summary was printed and given to the patient.

## 2014-03-01 LAB — URINE CULTURE: Colony Count: 70000

## 2014-03-03 NOTE — Progress Notes (Signed)
Encounter reviewed by Dr. Gregory Barrick Silva.  

## 2014-03-05 ENCOUNTER — Telehealth: Payer: Self-pay | Admitting: Emergency Medicine

## 2014-03-05 ENCOUNTER — Other Ambulatory Visit: Payer: Self-pay | Admitting: Obstetrics and Gynecology

## 2014-03-05 MED ORDER — AMOXICILLIN-POT CLAVULANATE 500-125 MG PO TABS: ORAL_TABLET | ORAL | Status: DC

## 2014-03-05 NOTE — Telephone Encounter (Signed)
Please contact the patient.  The Augmentin 500/125 mg is once daily for one week.  I will place the order.  Please have the patient begin this as soon as possible.  She will need a recheck in 10 days with provider.  If feeling malaise, nausea, vomiting, fever, dizziness, needs to be seen.   Cc - Shirlyn Goltz

## 2014-03-05 NOTE — Telephone Encounter (Signed)
Called [facility](336) [phone]   Left message for [facility], Dr. Alton Revere assistant at ext [phone] requested return call for advice if okay to use Augmentin  Result Notes     Notes Recorded by Lauro Franklin, FNP on 03/05/2014 at 8:43 AM Please call [facility] and verify OK to use Augmentin ------  Notes Recorded by Jacqualin Combes de Gwenevere Ghazi, MD on 03/04/2014 at 9:23 PM Please check with [facility].  After reviewing options, I was thinking about Augmentin 500/125 mg daily for 7 days. She will need a test of cure.

## 2014-03-05 NOTE — Telephone Encounter (Signed)
Dr. Edward Jolly do you want this med DAILY ? Or Bid??  Please forward to French Ana and let her know to call the pateint

## 2014-03-05 NOTE — Telephone Encounter (Signed)
Message copied by Joeseph Amor on Tue Mar 05, 2014  8:48 AM ------      Message from: Roanna Banning      Created: Tue Mar 05, 2014  8:43 AM       Please call [facility] and verify OK to use Augmentin ------

## 2014-03-05 NOTE — Telephone Encounter (Addendum)
Carla [name] from Dr. Hadley Pen office returned call. She states she paged Dr. Arrie Aran and he stated that Augmentin 500/125 mg is okay to use.  Routing to [place], FNP to review pended order if okay to send and notify patient.

## 2014-03-06 MED ORDER — AMOXICILLIN-POT CLAVULANATE 500-125 MG PO TABS: 1.0000 | ORAL_TABLET | Freq: Every day | ORAL | Status: DC

## 2014-03-06 NOTE — Telephone Encounter (Signed)
Spoke with patient. Advised of results and message as seen below from Dr.Silva. Patient is agreeable and verbalizes understanding. TOC appointment scheduled for 9/15 at 11:15am with Lauro Franklin, FNP. Patient agreeable to date and time.  Routing to Dr.Silva Cc: Lauro Franklin, FNP   Routing to provider for final review. Patient agreeable to disposition. Will close encounter

## 2014-03-19 ENCOUNTER — Encounter: Payer: Self-pay | Admitting: Nurse Practitioner

## 2014-03-19 ENCOUNTER — Ambulatory Visit (INDEPENDENT_AMBULATORY_CARE_PROVIDER_SITE_OTHER): Payer: Medicare Other | Admitting: Nurse Practitioner

## 2014-03-19 VITALS — BP 142/90 | HR 68 | Ht 61.5 in | Wt 167.0 lb

## 2014-03-19 DIAGNOSIS — N8110 Cystocele, unspecified: Secondary | ICD-10-CM

## 2014-03-19 DIAGNOSIS — N823 Fistula of vagina to large intestine: Secondary | ICD-10-CM

## 2014-03-19 DIAGNOSIS — N390 Urinary tract infection, site not specified: Secondary | ICD-10-CM

## 2014-03-19 DIAGNOSIS — N8111 Cystocele, midline: Secondary | ICD-10-CM

## 2014-03-19 DIAGNOSIS — N824 Other female intestinal-genital tract fistulae: Secondary | ICD-10-CM

## 2014-03-19 NOTE — Progress Notes (Addendum)
Subjective:   80 y.o. WidowedWhite female G10P4 here for UTI TOC.  Her last in and out cath specimne showed multiple bacteria but was covered with Macrobid for a week.   She also has a new pessary that has come in to replace her old one.  Patient has been using following pessary style and size: 5 short stem Gellhorn.  She denies fever/ chills or back pain.  She has no unusual urinary symptoms.   Use of protective clothing such as Depends or pads Yes.    With her upcoming trip to [place] she needs a RX for pads that are supposed to be better and last for 5 hours.  The pads are Tranquility pads and come in a box of 18.  Rx is given for these with refills prn.   Problems with protective clothing with rash Yes.  .  Constipation issues with use of pessary is at times but better with stool softners.  She is not sexually active.     ROS:   no breast pain or new or enlarging lumps on self exam,  no abnormal bleeding, pelvic pain or discharge,   no dysuria, trouble voiding or hematuria. Compliant to use of vaginal cream Yes.   General Exam:    BP 142/90  Pulse 68  Ht 5' 1.5" (1.562 m)  Wt 167 lb (75.751 kg)  BMI 31.05 kg/m2  General appearance: alert, cooperative and appears stated age   Pelvic: External genitalia:  Pink and irritated but no broken areas.   In and out catheter procedure was carried out but unable to get into the urethra.  She has a prolapse and evidently the pessary position was making the catheter unable to pass.  After several attempts got Dr. Farrel Gobble to help.  I did not want to take out the pessary due to her having stool in the vagina with pessary changes.              Urethra: prolased and discolored   Dr. Farrel Gobble examined pt.  She was able to remove stool and then we decided to irrigate the vagina.  Cervix was then visuaized.  Vagina was clean.  Then in and out catheter urine was obtained for C&S.  Stool continued into vaginal. Vaginal exam performed.  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    Dr. Hyacinth Meeker then asked to examine pt.  Same findings noted.  Recommended pessary stay out as this sits low in the vaginal and fear of further pessary erosion on vaginal is a concern.                 Mary, patient's daughter, was present and informed about findings   Assement :  Pelvic relaxation with pessary use    RV fistula    Probable UTI, possible chronic due to fistula    H/O urosepsis this summer    H/O renal insufficiency  Plan:   Consulted general surgery and Dr. Hyacinth Meeker spoke to pt personally.  She will see the patient ASAP.     Await urine culture for results.  Will need to check with nephrologist regarding antibiotic, as well.     Approximately 1 hour spent with pt with counseling about above findings and need for surgical consult.  Pt and daughter have clear understanding of issues above.  I examined pt, pessary removed to aid in in and out catherization.  Vagina swept with lidocaine jelly and ring forcep was used to deflect the bulb  of the gelhorn pessary, rotate and remove. Stool noted on pessary, speculum placed, stool noted high in vault, vagina flushed with normal saline until clear.  Cervix deflected and inflammed, excoriation of vagina noted.  Slow retreat of speculum revealed a 4cm horizontal defect, confirmed on RVE, just beyond hymen. Edges of fistula smooth, nontender and not bleeding. Pt informed of findings and need to keep pessary out and refer to GS.  Dr Hyacinth Meeker informed of findings and reexamined pt

## 2014-03-20 ENCOUNTER — Telehealth: Payer: Self-pay | Admitting: Nurse Practitioner

## 2014-03-20 DIAGNOSIS — N823 Fistula of vagina to large intestine: Secondary | ICD-10-CM

## 2014-03-20 NOTE — Telephone Encounter (Signed)
Spoke with Penni Bombard at [facility]. Patient is scheduled for 04/02/14 at 0955 and patient is aware. She has spoken to the patient.  Penni Bombard also discussed with Dr. Maisie Fus and Dr. Maisie Fus is not requesting that any imaging be done prior to appointment.  Patient chose this date for evaluation as she is going out of town, despite being offered earlier office visits. Referral and medical records faxed to attention Dr. Maisie Fus.   Routing to Billie Ruddy, RN  Lauro Franklin, FNP and Dr. Farrel Gobble.

## 2014-03-20 NOTE — Telephone Encounter (Signed)
No record of referral for patient.   Carla [name],  Are you referring this patient to [facility]? I have no record of a referral being entered, but received this message while I was out of the office for lunch.  Thank you, Martie Lee

## 2014-03-20 NOTE — Telephone Encounter (Signed)
Carla [name] from [facility] called re: received only the patient's name for referral. Confirmed patient's date of birth as they did not have that information. Carla [name] requests referral information be faxed to Dr. Maurine Minister attention at: [phone].

## 2014-03-21 ENCOUNTER — Encounter: Payer: Self-pay | Admitting: Nurse Practitioner

## 2014-03-21 NOTE — Telephone Encounter (Signed)
Patient is  having some problems and is asking to talk with Patty. I told her a nurse would return her call.

## 2014-03-21 NOTE — Telephone Encounter (Signed)
[name] with patient. She reports that since yesterday, she has required to change her incontinence pads q hour as they are soiled with both urine and fecal matter. Patient reports that this morning she woke up at 0500 and states "I bled like a stuck pig." Patient states the bleeding stopped spontaneously and now she is not having any bleeding.  Patient states she will not be traveling now due to her current issues.  I advised I would speak with Dr. Hyacinth Meeker and return her call with plan of care, patient agreeable.

## 2014-03-21 NOTE — Telephone Encounter (Signed)
Reviewed with Dr Hyacinth Meeker. Call to Dr Maisie Fus office, spoke to Noble in triage. Dr Maisie Fus in surgery today and is not back in office until 03-26-14. They will have to check with her before scheduling on 03-26-14. Quincy Sheehan that patients incontinence, both urin and fecal has increased significantly since pessary has been removed, limiting ADL and canceling her travel plans.  Advised we were interested in Dr Maisie Fus' thoughts on re-insertion of pessary. [name] will send message to Dr Maisie Fus and call us back.  Call to patient given update. Holding off on appointment here until response from dr Maisie Fus. Patient denies further bleeding.

## 2014-03-22 ENCOUNTER — Encounter: Payer: Self-pay | Admitting: Nurse Practitioner

## 2014-03-22 ENCOUNTER — Ambulatory Visit (INDEPENDENT_AMBULATORY_CARE_PROVIDER_SITE_OTHER): Payer: Medicare Other | Admitting: Nurse Practitioner

## 2014-03-22 VITALS — BP 144/76 | HR 76 | Ht 61.5 in | Wt 167.0 lb

## 2014-03-22 DIAGNOSIS — R42 Dizziness and giddiness: Secondary | ICD-10-CM

## 2014-03-22 LAB — HEMOGLOBIN, FINGERSTICK: HEMOGLOBIN, FINGERSTICK: 15.5 g/dL (ref 12.0–16.0)

## 2014-03-22 MED ORDER — AMOXICILLIN-POT CLAVULANATE 500-125 MG PO TABS: 1.0000 | ORAL_TABLET | Freq: Every day | ORAL | Status: DC

## 2014-03-22 NOTE — Telephone Encounter (Signed)
See OV note from Dr Edward Jolly today.  Routing to Dr Edward Jolly for final review. Patient agreeable to disposition. Will close encounter

## 2014-03-22 NOTE — Telephone Encounter (Signed)
Pt says she can come in at 2:15 today.

## 2014-03-22 NOTE — Progress Notes (Signed)
Subjective:     Patient ID: Carla [name], female   DOB: 1922-03-21, 80 y.o.   MRN: [id number]  HPI  This 79 yo WW Fe here for a recheck.  She has history of procidentia and uses a 5 " short stem Gellhorn pessary.  Since April 21/2015 we have noted stool at the time of pessary change.  She has limited ROM of right shoulder and unable to clean as well with the left.  Therefore we thought her stool was incontinence.  On Tuesday had found a rectal vaginal wall fistula that was not found before.  The pessary was left out   She has upcoming pending appointment with General surgery to Dr. Romie Levee.  In the interim after leaving here on Tuesday,  had a large amount of blood on the pad and in commode. Maybe a little light headed, but not dizzy. By Wednesday and Thursday only small pink amount.  Normal BM yesterday and small amount of stool today.   She is very uncomfortable with pessary out because of no control and increase in incontinence.  She has cancelled her plans to fly to [place] to visit with her daughter.  Carla [name], the daughter who lives here is still going as planned.  She does have a form to be completed.   Review of Systems  Constitutional: Negative for fever, chills and fatigue.  HENT: Negative.   Respiratory: Negative.   Cardiovascular: Negative.   Gastrointestinal: Negative.  Negative for nausea, vomiting, abdominal pain, diarrhea, constipation and abdominal distention.  Genitourinary: Positive for urgency and frequency. Negative for flank pain.       With pessary out very little control of urine.  Incontinence is worse with change of position..  External vulva areas are also more irritated secondary to pad wear.  Stool incontinence is also worse.  Urine culture is still pending but has E.coli and proteus.  We have already called her nephrologist and given the OK to use either Augmentin or Cipro.  Musculoskeletal: Negative.   Skin: Negative.   Neurological: Negative.    Psychiatric/Behavioral: Negative.        Objective:   Physical Exam  Constitutional: She is oriented to person, place, and time. She appears well-developed and well-nourished. No distress.  Abdominal: She exhibits no distension. There is no tenderness. There is no rebound.  Genitourinary:  In vaginal vault there is some stool but much less than on Tuesday.  She still has a prolapsed urethra and what appears to be a hematoma or thrombus at the entrance.  Patient was also examined by Dr. Edward Jolly.  Agreement was to leave out the pessary.  Neurological: She is alert and oriented to person, place, and time.  Psychiatric: She has a normal mood and affect. Her behavior is normal. Judgment and thought content normal.       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

## 2014-03-22 NOTE — Telephone Encounter (Signed)
Patient calling to check on status of the call. °

## 2014-03-22 NOTE — Telephone Encounter (Signed)
Spoke with patient. Advised she is scheduled for 2:15 today. She is agreeable.

## 2014-03-22 NOTE — Telephone Encounter (Signed)
Call to patient. Advised we would like to work her in today. Only option is 215, patietn states she does not have transportation. Advised this is our only option so really need to see her today. She will try to work out transportation and call back.  Call back from [facility], [name]. Currently no earlier appointments available. They have one potential option for moving her up but wont know until Monday. [name] states Dr Maisie Fus recommended leaving pessary out based on call she had with Dr Hyacinth Meeker.  (call with Dr Hyacinth Meeker was prior to updated patient info with worsening symptoms). Advised we will see her today and lets Drs evaluate  And determine next step. OV today 215. Patient did call back and agree to time.

## 2014-03-22 NOTE — Telephone Encounter (Signed)
I Have called [facility] at [phone] and left a message for [facility] - Dr. Hadley Pen assistant.  About our mutual pt. [name].  She has a UTI - 2 organism are present: Proteus and E Coli. Both sensitives are not back.  But looks like she could take Cipro or Augmentin.  Most recently she was on Macrobid - but that one is listed as resistant.  This urine was a cath specimen TOC - but now have found that she has a rectal - vaginal fistula.

## 2014-03-23 LAB — URINE CULTURE

## 2014-03-24 ENCOUNTER — Encounter: Payer: Self-pay | Admitting: Nurse Practitioner

## 2014-03-24 NOTE — Progress Notes (Signed)
Encounter reviewed by Dr. Conley Simmonds.  Patient personally examined by me.  She was in no acute distress and appeared to be pleasant and in good spirits.   Noted to have 2 - 2.5 cm midline posterior mid-upper rectovaginal fistula.  Rectal mucosa noted, and no evidence of bleeding.  Right and left vaginal apical walls with mucosal erosion noted.  Urethra with prolapse and 3 - 4 mm thrombus of varicose vein noted.  I recommended leaving the pessary out and proceeding with colon and rectal surgical treatment next week.  Patient placed on once daily Augmentin 500/125 mg.

## 2014-03-27 DIAGNOSIS — N823 Fistula of vagina to large intestine: Secondary | ICD-10-CM | POA: Insufficient documentation

## 2014-03-27 NOTE — Progress Notes (Signed)
Reviewed personally.  M. Suzanne Shresta Risden, MD.  

## 2014-04-03 NOTE — Progress Notes (Signed)
Encounter reviewed by Dr. Brook Silva.  

## 2014-04-10 ENCOUNTER — Ambulatory Visit (HOSPITAL_COMMUNITY)
Admission: RE | Admit: 2014-04-10 | Discharge: 2014-04-10 | Disposition: A | Discharge status: Home / Self Care | Payer: Medicare Other | Source: Ambulatory Visit | Attending: General Surgery | Admitting: General Surgery

## 2014-04-10 ENCOUNTER — Encounter (HOSPITAL_COMMUNITY)
Admission: RE | Disposition: A | Discharge status: Home / Self Care | Payer: Self-pay | Source: Ambulatory Visit | Attending: General Surgery

## 2014-04-10 ENCOUNTER — Telehealth (INDEPENDENT_AMBULATORY_CARE_PROVIDER_SITE_OTHER): Payer: Self-pay | Admitting: General Surgery

## 2014-04-10 ENCOUNTER — Encounter (HOSPITAL_COMMUNITY): Payer: Self-pay | Admitting: *Deleted

## 2014-04-10 DIAGNOSIS — N823 Fistula of vagina to large intestine: Secondary | ICD-10-CM | POA: Insufficient documentation

## 2014-04-10 DIAGNOSIS — N828 Other female genital tract fistulae: Secondary | ICD-10-CM | POA: Diagnosis present

## 2014-04-10 HISTORY — DX: Hypothyroidism, unspecified: E03.9

## 2014-04-10 HISTORY — DX: Chronic kidney disease, unspecified: N18.9

## 2014-04-10 HISTORY — DX: Cardiac arrhythmia, unspecified: I49.9

## 2014-04-10 HISTORY — PX: RECTAL ULTRASOUND: SHX2306

## 2014-04-10 HISTORY — DX: Pneumonia, unspecified organism: J18.9

## 2014-04-10 SURGERY — US RECTUM

## 2014-04-10 NOTE — Progress Notes (Signed)
Daughter paged and sent to recovery to meet patient and Dr. Maisie Fushomas to go over procedure results. No recovery period due to patient unsedated.

## 2014-04-10 NOTE — Op Note (Addendum)
04/10/2014  1:29 PM  PATIENT:  Carla [name]  80 y.o. female  Patient Care Team: Eartha InchMichael C Badger, MD as PCP - General (Family Medicine)  PRE-OPERATIVE DIAGNOSIS:  rectal vaginal fistula  POST-OPERATIVE DIAGNOSIS:  Rectovaginal fistula  PROCEDURE:  ANAL ULTRASOUND  SURGEON:  Surgeon(s): Romie LeveeAlicia Shakiyah Cirilo, MD  ANESTHESIA:   none   PATIENT DISPOSITION:  PACU - hemodynamically stable.   INDICATION: Rectovaginal fistula  OR FINDINGS: 90 degree break in internal sphincter, ~180 degree break in external sphincter anteriorly   DESCRIPTION: The patient was identified in the holding area and taken to the procedure room where they were laid in lateral decubitus position on the exam room table.  A timeout was performed indicating the correct patient and procedure.  I began by performing a digital rectal exam.  There were no abnormal masses.   The US probe was inserted without difficulty.  The levators were identified and the probe was withdrawn slowly.  The internal sphincter was identified and followed distally. There was a 90 degree break at the level of the fistula.  The external sphincter was also identified and followed distally. There was a 180 degree break anteriorly.  The probe was removed without difficulty and the exam was completed.  Results were discussed with the patient and her daughter after the procedure.    Pictures printed and placed on chart to be scanned into epic

## 2014-04-10 NOTE — Discharge Instructions (Signed)
Post Colonoscopy Instructions  1. DIET: Follow a regular, high fiber diet.  2. You may have some mild rectal bleeding for the first few days after the procedure.  This should get less and less with time.  Resume any blood thinners 2 days after your procedure unless directed otherwise by your physician. 3. Take your usually prescribed home medications unless otherwise directed. a. If you have any pain, it is helpful to get up and walk around, as it is usually from excess gas. b. If this is not helpful, you can take an over-the-counter pain medication.  Choose one of the following that works best for you: i. Naproxen (Aleve, etc)  Two 220mg  tabs twice a day ii. Ibuprofen (Advil, etc) Three 200mg  tabs four times a day (every meal & bedtime) iii. If you still have pain after using one of these, please call the office 4. It is normal to not have a bowel movement for 2-3 days after colonoscopy.    5. ACTIVITIES as tolerated:   6. You may resume regular (light) daily activities beginning the next day--such as daily self-care, walking, climbing stairs--gradually increasing activities as tolerated.    WHEN TO CALL US [phone]: 1. Fever over 101.5 F (38.5 C)  2. Severe abdominal or chest pain  3. Large amount of rectal bleeding, passing multiple blood clots  4. Dizziness or shortness of breath 5. Increasing nausea or vomiting   The clinic staff is available to answer your questions during regular business hours (8:30am-5pm).  Please dont hesitate to call and ask to speak to one of our nurses for clinical concerns.   If you have a medical emergency, go to the nearest emergency room or call 911.  A surgeon from [facility] is always on call at the [facility]   [facility] [place] [address], [address], [place], [place]  [id number] ? MAIN: [phone] ? TOLL FREE: [phone] ?  FAX [phone] [email or web address]

## 2014-04-10 NOTE — Telephone Encounter (Signed)
Discussed US results with pt and her daughter.

## 2014-04-10 NOTE — H&P (Signed)
Glennie IsleEarline S. Kelley 04/02/2014 10:18 AM Location: [facility] Patient #: [id number] DOB: 03-15-1922 Undefined / Language: [place] / Race: White Female  History of Present Illness Romie Levee(Katlin Ciszewski MD; 04/02/2014 11:28 AM) Patient words: f/u.  The patient is a 80 year old female who presents with a complaint of rectovaginal fistula. Patient presents to the office with a rectovaginal fistula found by her gynecologist Dr. Leda QuailSuzanne Miller. The patient has a history of chronic urinary tract infections and urosepsis over the summer. She has been using a pessary for urinary prolapse. It was thought that this was possibly the cause of her rectovaginal fistula and the pessary was removed. The patient has urinary incontinence and wears protective clothing for this. She has a history of constipation but does have relatively regular bowel movements with stool softeners. She occasionally has fecal incontinence. Otherwise she is relatively healthy for her age. She has some atrial fibrillation controlled with medications.   Other Problems [facility]([place]; 04/02/2014 10:18 AM) Arthritis Atrial Fibrillation Bladder Problems Cholelithiasis Chronic Renal Failure Syndrome Hemorrhoids  Past Surgical History [facility]([place], RMA; 04/02/2014 10:18 AM) Gallbladder Surgery - Laparoscopic Tonsillectomy  Diagnostic Studies History [facility]([place]; 04/02/2014 10:18 AM) Mammogram 1-3 years ago Pap Smear 1-5 years ago  Allergies [facility]([place], RMA; 04/02/2014 10:19 AM) No Known Drug Allergies09/29/2015  Medication History ([place], RMA; 04/02/2014 10:21 AM) Acetaminophen (325MG  Tablet, Oral) Active. Amiodarone HCl (200MG  Tablet, Oral) Active. Vitamin C (Oral) Active. Vitamin D (1000UNIT Capsule, Oral) Active. Calcium Carb-Cholecalciferol (250-125MG -UNIT Tablet, Oral) Active. Premarin (0.625MG /GM Cream, Vaginal) Active. Colace (100MG   Capsule, Oral) Active. Mycolog II (100000-0.1UNIT/GM-% Ointment, External) Active. Protonix (40MG  Packet, Oral) Active. Medications Reconciled  Social History ([place]; 04/02/2014 10:18 AM) Caffeine use Tea. No alcohol use No drug use Tobacco use Never smoker.  Family History [facility]([place], [place]; 04/02/2014 10:18 AM) Diabetes Mellitus Father. Malignant Neoplasm Of Pancreas Father.  Pregnancy / Birth History [facility]([place]; 04/02/2014 10:18 AM) Age at menarche 14 years. Age of menopause 75>60 Gravida 694 Maternal age 78-30 Para 4  Review of Systems ([place] RMA; 04/02/2014 10:18 AM) General Not Present- Appetite Loss, Chills, Fatigue, Fever, Night Sweats, Weight Gain and Weight Loss. Skin Not Present- Change in Wart/Mole, Dryness, Hives, Jaundice, New Lesions, Non-Healing Wounds, Rash and Ulcer. HEENT Present- Seasonal Allergies and Sinus Pain. Not Present- Earache, Hearing Loss, Hoarseness, Nose Bleed, Oral Ulcers, Ringing in the Ears, Sore Throat, Visual Disturbances, Wears glasses/contact lenses and Yellow Eyes. Respiratory Not Present- Bloody sputum, Chronic Cough, Difficulty Breathing, Snoring and Wheezing. Breast Not Present- Breast Mass, Breast Pain, Nipple Discharge and Skin Changes. Cardiovascular Not Present- Chest Pain, Difficulty Breathing Lying Down, Leg Cramps, Palpitations, Rapid Heart Rate, Shortness of Breath and Swelling of Extremities. Gastrointestinal Present- Change in Bowel Habits and Hemorrhoids. Not Present- Abdominal Pain, Bloating, Bloody Stool, Chronic diarrhea, Constipation, Difficulty Swallowing, Excessive gas, Gets full quickly at meals, Indigestion, Nausea, Rectal Pain and Vomiting. Female Genitourinary Present- Frequency, Nocturia and Urgency. Not Present- Painful Urination and Pelvic Pain. Musculoskeletal Present- Joint Pain, Joint Stiffness and Muscle Weakness. Not Present- Back Pain, Muscle  Pain and Swelling of Extremities. Neurological Present- Trouble walking. Not Present- Decreased Memory, Fainting, Headaches, Numbness, Seizures, Tingling, Tremor and Weakness. Psychiatric Not Present- Anxiety, Bipolar, Change in Sleep Pattern, Depression, Fearful and Frequent crying. Endocrine Not Present- Cold Intolerance, Excessive Hunger, Hair Changes, Heat Intolerance, Hot flashes and New Diabetes. Hematology Present- Easy Bruising. Not Present- Excessive bleeding, Gland problems, HIV and Persistent Infections.  Vitals ([place] RMA; 04/02/2014 10:19 AM) 04/02/2014 10:18 AM Weight: 164 lb Height: 63in Body Surface Area: 1.82 m Body Mass Index: 29.05 kg/m Temp.: 97.7F(Oral)  Pulse: 83 (Regular)  BP: 152/72 (Sitting, Left Arm, Standard)    Physical Exam Romie Levee MD; 04/02/2014 11:30 AM) General Mental Status-Alert. General Appearance-Cooperative.  Chest and Lung Exam Auscultation Breath sounds - Normal.  Cardiovascular Auscultation Rhythm - Irregular.  Abdomen Palpation/Percussion Palpation and Percussion of the abdomen reveal - Soft and Non Tender.  Rectal Proctoscopic exam -Note: see procedure note.     Assessment & Plan Romie Levee MD; 04/02/2014 11:34 AM) RECTOVAGINAL FISTULA (619.1  N82.3) Story: Patient has a large rectovaginal fistula, presumably from pessary erosion. Impression: I will get a rectal ultrasound to evaluate the continuity of her sphincter muscles. we discussed surgical options. This would include a diverting colostomy, perineal proctectomy with primary closure and sphincteroplasty. Given the size of her wound and her age, she has approximately a 50% chance of healing her wound. This would increase if we did a diverting colostomy at the same time.

## 2014-04-11 ENCOUNTER — Encounter (HOSPITAL_COMMUNITY): Payer: Self-pay | Admitting: General Surgery

## 2014-04-25 ENCOUNTER — Ambulatory Visit (INDEPENDENT_AMBULATORY_CARE_PROVIDER_SITE_OTHER): Payer: Medicare Other | Admitting: Nurse Practitioner

## 2014-04-25 ENCOUNTER — Encounter: Payer: Self-pay | Admitting: Nurse Practitioner

## 2014-04-25 VITALS — BP 160/86 | HR 72 | Resp 16 | Ht 61.5 in | Wt 170.0 lb

## 2014-04-25 DIAGNOSIS — N823 Fistula of vagina to large intestine: Secondary | ICD-10-CM

## 2014-04-25 NOTE — Progress Notes (Signed)
Patient ID: Carla FellerEarline S [name], female   DOB: 1922/01/28, 80 y.o.   MRN: [id number]  S: This 80 yo WW Fe G4P4 presents for a consult visit.  She was recently diagnosed with rectovaginal wall fistula.  She has seen Dr. Romie LeveeAlicia Thomas, general surgeon for repair.  After a rectal ultrasound. "it was felt that the tissue was too thin and the best option was to do a colostomy".  She has decided to wait and not do anything at present.  She has been informed that she will need to get frequent urine checks since she is at high risk for urosepsis.  She has to have BUN and creatine checked often but wonders if we can do the urine test for her.  She is aware that most likely will need to be in/out catheter and feels very comfortable with our practice in doing this procedure. At this time she is unsure as to how often the urine culture should be done. She does not have any sensation of needing to go to the bathroom to urinate - she just leaks all the time now.  Her BM's are daily and she does shower with a hand held wand to wash as best she can.  She does continue using some vaginal estrogen cream 1-2 times a week.  Several deciding factors for surgery include:  History of A Fib, hypothyroid, anemia, urosepsis in the past, and age.  Social:  Her daughter who lives in [place] is suffering from recurrent pancreatic cancer.  The daughter and family had a recent gathering for the patient's 89th Birthday.  Originally the patient was not going due to new diagnosis, but at last minute put some clothes in a bag and went on.  The daughter had become very upset that she would not see her mother again. The time with her daughter was bittersweet.  The daughter was having a lot of pain.  Now is having a 'liver infection' and a mass in the shoulder / chest area that maybe surgery will be planned to debulk a mass to relive pain.  She is on Morphine.  It looks as though she has less than a month.   O: BP is elevated @ 160/86  Patient  denies any urinary symptoms at this time  There is no flank pain  There is swelling of both ankles and feet (3 lb wt gain since last visit)   Assessment: Rectovaginal Fistula - erosion from use of Pessary to treat cystocele and rectocele.   History of A Fib, HTN, anemia, and past urosepsis   Plan: Discussed at length with Dr. Edward JollySilva to find a way to meet her needs and still be the best choice for her overall health.  Will plan to get notes from Dr. Romie LeveeAlicia Thomas and review what her specifics recommendaitons are for urine protocol for screening for infection.  But even though we may follow recommendations, she could uroseptic which could also lead to her demise.  After notes are back we plan to have another consult visit with her to discuss.  One very special problem is that she is very connected to us as we are to her.  Total time with patient is 30 minutes - half of time in face to face discussion.  Note is sent to Kennon RoundsSally to get notes from Dr. Maisie Fushomas

## 2014-04-25 NOTE — Progress Notes (Deleted)
Subjective:     Patient ID: Carla [name], female   DOB: 05/18/1922, 80 y.o.   MRN: [id number]  HPI  Dr. Cyndia BentBadger PCP  normALLY GETS LABS  Coladonato is renal Review of Systems     Objective:   Physical Exam     Assessment:     ***    Plan:     ***

## 2014-04-26 ENCOUNTER — Telehealth: Payer: Self-pay | Admitting: *Deleted

## 2014-04-26 NOTE — Telephone Encounter (Signed)
[facility] for office notes. They will fax to office.

## 2014-04-26 NOTE — Progress Notes (Signed)
Called for office notes on 04-26-14 at 1205.

## 2014-04-28 NOTE — Progress Notes (Signed)
Encounter reviewed by Dr. Royanne Warshaw Silva.  

## 2014-04-30 ENCOUNTER — Telehealth (INDEPENDENT_AMBULATORY_CARE_PROVIDER_SITE_OTHER): Payer: Self-pay

## 2014-04-30 ENCOUNTER — Telehealth: Payer: Self-pay | Admitting: Emergency Medicine

## 2014-04-30 NOTE — Telephone Encounter (Signed)
Carla FranklinPatricia Rolen-Grubb, FNP requested that I call [facility] to obtain further records and orders from Dr. Maisie Fushomas. Patient reports she was advised by a doctor (unsure which one) that she will need intermittent uirine catheterization to ensure no UTI's.  Calling Dr. Maisie Fushomas' office at [phone] to send message to Dr. Maisie Fushomas, if recommended for patient to have cath UA and at what intervals does she recommend?

## 2014-04-30 NOTE — Telephone Encounter (Signed)
Pt's OB/GYN office calling for clarification.  Pt has told them that a provider (she cannot recall which provider) recommended she have regular urinary sterile catheterizations and testing due to her chronic UTI's and urinary incontinence issues.  Dr. Marvia Picklesosen-Grubb would like to know if this came from Dr. Maisie Fushomas.  All notes read and no documentation of this was found.  Please advise.

## 2014-05-01 NOTE — Telephone Encounter (Signed)
Patty,  I could not get any further information from Dr. Maisie Fushomas' office.

## 2014-05-01 NOTE — Telephone Encounter (Signed)
No, it wasn't from me.  I have called her Dr Hyacinth MeekerMiller a couple times to discuss her case but haven't gotten a call back.  Would be glad to discuss the pt's fistula issues with MD, if she'd like.

## 2014-05-01 NOTE — Telephone Encounter (Signed)
Contacted Dr. Tiburcio Peaosen-Grubb's office to relay Dr. Maisie Fushomas' message.

## 2014-05-01 NOTE — Telephone Encounter (Signed)
Bur[place] with Dr Nelia Shihomass' office called because Dr Maisie Fushomas has not seen this patient for incontinence care.

## 2014-05-06 ENCOUNTER — Encounter: Payer: Self-pay | Admitting: Nurse Practitioner

## 2014-05-06 ENCOUNTER — Other Ambulatory Visit: Payer: Self-pay | Admitting: Nurse Practitioner

## 2014-05-06 NOTE — Telephone Encounter (Signed)
Last refilled: 04/09/13 #42/5 gm/3 rfs Last OV: 04/25/14 with Ms. Patty AEX Scheduled: no current AEX scheduled.  Please Advise.

## 2014-05-07 ENCOUNTER — Telehealth: Payer: Self-pay | Admitting: Nurse Practitioner

## 2014-05-07 NOTE — Telephone Encounter (Signed)
Patient was informed that we would be doing a monthly in out urine cath for her.  If between times she has symptoms would also check her.  If we know orders, or if Dr. Cyndia BentBadger puts orders in for labs we can do at same time.  She is most appreciative of our care.

## 2014-05-10 NOTE — Telephone Encounter (Signed)
Records received and reviewed by Patty.  Routing to provider for final review. Patient agreeable to disposition. Will close encounter

## 2014-05-15 ENCOUNTER — Encounter: Payer: Self-pay | Admitting: Nurse Practitioner

## 2014-05-15 ENCOUNTER — Ambulatory Visit (INDEPENDENT_AMBULATORY_CARE_PROVIDER_SITE_OTHER): Payer: Medicare Other | Admitting: Nurse Practitioner

## 2014-05-15 VITALS — BP 138/70 | HR 78 | Ht 61.5 in | Wt 170.0 lb

## 2014-05-15 DIAGNOSIS — N189 Chronic kidney disease, unspecified: Secondary | ICD-10-CM

## 2014-05-15 DIAGNOSIS — N390 Urinary tract infection, site not specified: Secondary | ICD-10-CM

## 2014-05-15 DIAGNOSIS — N823 Fistula of vagina to large intestine: Secondary | ICD-10-CM

## 2014-05-15 LAB — CBC WITH DIFFERENTIAL/PLATELET
BASOS ABS: 0.1 10*3/uL (ref 0.0–0.1)
BASOS PCT: 1 % (ref 0–1)
Eosinophils Absolute: 0.1 10*3/uL (ref 0.0–0.7)
Eosinophils Relative: 1 % (ref 0–5)
HCT: 40.3 % (ref 36.0–46.0)
Hemoglobin: 13.4 g/dL (ref 12.0–15.0)
LYMPHS PCT: 16 % (ref 12–46)
Lymphs Abs: 1.4 10*3/uL (ref 0.7–4.0)
MCH: 29.4 pg (ref 26.0–34.0)
MCHC: 33.3 g/dL (ref 30.0–36.0)
MCV: 88.4 fL (ref 78.0–100.0)
MONOS PCT: 12 % (ref 3–12)
Monocytes Absolute: 1.1 10*3/uL — ABNORMAL HIGH (ref 0.1–1.0)
NEUTROS ABS: 6.2 10*3/uL (ref 1.7–7.7)
NEUTROS PCT: 70 % (ref 43–77)
Platelets: 396 10*3/uL (ref 150–400)
RBC: 4.56 MIL/uL (ref 3.87–5.11)
RDW: 14.1 % (ref 11.5–15.5)
WBC: 8.9 10*3/uL (ref 4.0–10.5)

## 2014-05-15 LAB — BASIC METABOLIC PANEL
BUN: 14 mg/dL (ref 6–23)
CHLORIDE: 101 meq/L (ref 96–112)
CO2: 29 mEq/L (ref 19–32)
Calcium: 9.3 mg/dL (ref 8.4–10.5)
Creat: 1.22 mg/dL — ABNORMAL HIGH (ref 0.50–1.10)
GLUCOSE: 109 mg/dL — AB (ref 70–99)
Potassium: 4.2 mEq/L (ref 3.5–5.3)
SODIUM: 142 meq/L (ref 135–145)

## 2014-05-15 NOTE — Progress Notes (Signed)
Subjective:     Patient ID: Carla [name], female   DOB: 1922-07-02, 80 y.o.   MRN: [id number]  HPI  This 80 yo WW Fe comes in for a cath urine.  She has a rectovaginal fistula and because of concerns about UTI we are following protocol to get a cath urine.  She saw PCP recently and had a list of the labs to be done here for her.  Th only other problems now is right sciatica pain and having to use a walker.   Review of Systems  Constitutional: Negative for fever, chills and fatigue.  HENT: Negative.   Respiratory: Negative.   Cardiovascular: Negative.   Gastrointestinal: Negative.   Genitourinary: Negative for difficulty urinating.  Musculoskeletal: Negative.   Skin: Negative.   Neurological: Negative.   Psychiatric/Behavioral: Negative.        Objective:   Physical Exam  Constitutional: She is oriented to person, place, and time. She appears well-developed and well-nourished. No distress.  Abdominal: Soft. She exhibits no distension. There is no tenderness. There is no rebound and no guarding.  Genitourinary:  Very little stool in the vaginal vault. A clean cath urine with very little output.  She is given liquids and a repeat cath with about 3 cc collection.  She had voided incontinent already on the pad as she has no control.  Neurological: She is alert and oriented to person, place, and time.  Skin: Skin is warm and dry.  Psychiatric: She has a normal mood and affect. Her behavior is normal. Judgment and thought content normal.       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

## 2014-05-16 NOTE — Progress Notes (Signed)
Encounter reviewed by Dr. Asencion Guisinger Silva.  

## 2014-05-17 LAB — URINE CULTURE: Colony Count: 9000

## 2014-06-13 ENCOUNTER — Ambulatory Visit (INDEPENDENT_AMBULATORY_CARE_PROVIDER_SITE_OTHER): Payer: Medicare Other | Admitting: Nurse Practitioner

## 2014-06-13 ENCOUNTER — Encounter: Payer: Self-pay | Admitting: Nurse Practitioner

## 2014-06-13 ENCOUNTER — Telehealth: Payer: Self-pay | Admitting: Nurse Practitioner

## 2014-06-13 VITALS — BP 122/74 | HR 72 | Ht 61.5 in | Wt 168.0 lb

## 2014-06-13 DIAGNOSIS — N322 Vesical fistula, not elsewhere classified: Secondary | ICD-10-CM

## 2014-06-13 DIAGNOSIS — N3000 Acute cystitis without hematuria: Secondary | ICD-10-CM

## 2014-06-13 DIAGNOSIS — N823 Fistula of vagina to large intestine: Secondary | ICD-10-CM

## 2014-06-13 DIAGNOSIS — N189 Chronic kidney disease, unspecified: Secondary | ICD-10-CM

## 2014-06-13 MED ORDER — NITROFURANTOIN MACROCRYSTAL 50 MG PO CAPS: 50.0000 mg | ORAL_CAPSULE | Freq: Four times a day (QID) | ORAL | Status: DC

## 2014-06-13 MED ORDER — NITROFURANTOIN MACROCRYSTAL 50 MG PO CAPS: ORAL_CAPSULE | ORAL | Status: DC

## 2014-06-13 NOTE — Telephone Encounter (Signed)
Pharmacist at [facility] in [place] has dosing/qty questions   macrodentin (just sent in)  Please call

## 2014-06-13 NOTE — Telephone Encounter (Signed)
Spoke with Carla FranklinPatricia Rolen-Grubb, FNP who verified order for Macrodentin take 1 tablet daily #30 2RF. Carla FranklinPatricia Rolen-Grubb, FNP [facility]. [facility] to verify order quantity and instructions.   Routing to provider for final review. Patient agreeable to disposition. Will close encounter

## 2014-06-13 NOTE — Progress Notes (Signed)
Subjective:     Patient ID: Carla FellerEarline S [name], female   DOB: 11-20-1921, 80 y.o.   MRN: [id number]  HPI  This 80 yo WW Fe comes in today for a cath urine.  She was able to drink a lot of fluids on her way over.  She has history of rectovaginal fistula and because of that concern we are doing cath urine monthly.  This will be her second one.  Last C&S was negative.  She does admit to having a slight pink staining from time to time.  Also the cost of Premarin cream is now costing her $130.00.  She is using this at the urethra.  Daughter who has cancer had a debulking procedure sone to remove mass from the neck.  She evidently is on a ventilator.  Still expectations are low.   Review of Systems  Constitutional: Negative for fever, chills and fatigue.  Respiratory: Negative.   Cardiovascular: Negative.   Gastrointestinal: Negative for nausea, vomiting, diarrhea and constipation.  Genitourinary: Positive for vaginal bleeding. Negative for hematuria, decreased urine volume, genital sores and pelvic pain.       Urine incontinence without any control.  Musculoskeletal: Positive for myalgias, back pain and arthralgias. Negative for neck pain.  Skin: Negative.   Neurological: Negative for tremors, syncope, numbness and headaches.  Psychiatric/Behavioral: Negative.        Objective:   Physical Exam  Constitutional: She is oriented to person, place, and time. She appears well-developed and well-nourished. No distress.  Abdominal: Soft. She exhibits no distension. There is no tenderness. There is no rebound and no guarding.  Genitourinary:  On initial exam of the vaginal vault there appears to be granulation type tissue at the introitus.  She is then cleaned with Betadine and In/Out catheter is inserted with only minimal 2 cc of urine.  Then exam was turned to vaginal vault.  Very little stool in th vault.  There is an area that looks like fleshy tissue that is slight fragile could ectropion from the  cervix.  Dr. Edward JollySilva was then consulted and attempts to further investigate with another catheter insertion.  On bimanual she was able to gently push the tissue through the opening and urine leakage could then be seen coming out the vagina.  It was felt then this was a uro vaginal fistula.  Neurological: She is alert and oriented to person, place, and time.  Psychiatric: She has a normal mood and affect. Her behavior is normal. Judgment and thought content normal.       Assessment:     History of UTI and past history of urosepsis - doing monthly cath as screening History of procidentia with prior use of pessary History of rectal vaginal fistula - no pessary use New finding of Uro vaginal fistula     Plan:     Discussion by Dr. Edward JollySilva to patient and daughter Carla [name]. Plan to refer her to a [facility] setting such as [facility] and get a consult with a pelvic floor team.  She will need repair of bladder and rectal fistula.  The team will decide on the best treatment and approach to repair.  Will not plan next months apt. at this time for a repeat cath urine.

## 2014-06-14 ENCOUNTER — Encounter: Payer: Self-pay | Admitting: Nurse Practitioner

## 2014-06-14 ENCOUNTER — Telehealth: Payer: Self-pay

## 2014-06-14 ENCOUNTER — Telehealth: Payer: Self-pay | Admitting: *Deleted

## 2014-06-14 NOTE — Telephone Encounter (Signed)
Encounter opened in error

## 2014-06-14 NOTE — Telephone Encounter (Signed)
Call to [facility] requesting new patient appointment. Reviewed with triage nurse, Delice Bisonara,  patient's clinical history. Appointment scheduled for Monday, December 14 at 8:30,  patient to arrive at 8 AM for registration. Delice Bisonara to have Joice from [facility] registration call me for patient demographic information. Note to be faxed to [phone]. Demographics to be faxed to [phone].

## 2014-06-14 NOTE — Telephone Encounter (Signed)
Call to patient and her daughter. Appointment information given as well as phone number to call for additional information and directions.   Routing to provider for final review. Patient agreeable to disposition. Will close encounter

## 2014-06-14 NOTE — Progress Notes (Addendum)
Encounter reviewed by Dr. Conley SimmondsBrook Silva.  Anterior vaginal wall with mucosal appearance and opening of 1.5 cm.  Foley catheter placed to assist in diagnosis of fistula.  Foley balloon palpable through opening.  Urine noted to leak from the mucosal opening.  Foley removed.  Vesicovaginal fistula confirmed.   [facility] for [facility] evaluation and treatment of vesicovaginal and rectovaginal fistulas.  Addendum - 40 minutes face to face time with patient of which over 50% was spent in counseling.

## 2014-06-14 NOTE — Telephone Encounter (Addendum)
Alona BeneJoyce from [facility] called to register patient. Demographic information relayed. [facility] medical record number [id number]. [facility] at [employer] [address] [place] [id number] 3rd floor [address]. Will see Dr Oliva BustardAmy Kawasaki.

## 2014-06-18 ENCOUNTER — Telehealth: Payer: Self-pay | Admitting: Nurse Practitioner

## 2014-06-18 LAB — URINE CULTURE: Colony Count: >=100000

## 2014-06-18 NOTE — Telephone Encounter (Signed)
I now see some of the info from [facility] on this note.

## 2014-06-18 NOTE — Telephone Encounter (Signed)
Patient's daughter "Corrie DandyMary" is calling regarding referral to [facility]. Patient's appointment is late January 2016.  Corrie DandyMary was under the impression this was urgent that she needed to be seen ASAP.  Corrie DandyMary is asking if her mom needs to come in to see Shirlyn GoltzPatty Grubb before this at [facility]. DPR on file to talk to [facility].

## 2014-06-18 NOTE — Telephone Encounter (Signed)
Return call to patient's daughter, Carla [name]. She states [facility] and they recommend referral to 3 additional physicians, GYN/ONC (there is one area they noted to be suspicious but they did not biopsy in office), urology and rectal surgeon.  Carla [name] states MD mentioned possible bladder reconstruction, including possible stoma but that CT scan will be needed to determine extent of repairs to be done. First appointment is not scheduled until late January and other appointments are not even scheduled yet. Carla [name] is calling to schedule recheck her with Carla [name] in 3 weeks. Carla RunningAdvised Carla [name] that we will need to contact office at [facility] to get records from yesterday's visit and coordinate plans for our involvement in patients care. We will get records and review with Carla [name] and call patient/daughter back.

## 2014-06-18 NOTE — Telephone Encounter (Signed)
Call to [facility].  LM on triage nurse VM requesting update, office visit notes and what/if any involvement or local care is needed until she can have the recommended appointments. Left message to call back with update.

## 2014-06-19 NOTE — Telephone Encounter (Signed)
I can see in Care Anywhere from [facility] that the patient is seeing Urology at the end of January. I am unable to see detailed notes from her visit in EPIC. I anticipate that they will be able to send some written information for us to review.  [facility], and these appointments and studies (imaging) take time to coordinate.  They will provide a comprehensive approach.  We will let them continue to care for her as she needs a university setting.   Patient is on prophylactic nitrofurantoin for UTI prophylaxis.   I recommend that she be seen in our office at the beginning of January for a visit to see how she is doing, just like she is doing her regularly monthly visit.  If she is having problems, we can see her sooner.  Please have the patient see Patty at a time when I am available, so I can see her as well.   Cc- Shirlyn GoltzPatty Grubb

## 2014-06-19 NOTE — Telephone Encounter (Signed)
Call from Delice Bisonara at [facility] with update. She will fax office visit notes. They will be referring patient for multiple appointments to coordinate care. One appointment will be with GYN oncology for evaluation of vaginal mass. Per Delice Bisonara, since patient is on prophylactic antibiotics at this time,  do not feel like routine visit is needed here, rather would recommend catheterization if patient exhibits change in symptoms particularly since it would be easier for patient to get to our office.

## 2014-06-19 NOTE — Telephone Encounter (Signed)
Call to patient's daughter, Carla [name], advised of Dr. Rica RecordsSilva's update as below and recommendation for appointment in January. Appointment scheduled for 07/11/2014 at 11:15 with Patty. Dr. Serita ShellerSilva.will be available in office at that time. Daughter is aware that we will need to confirm this appointment time with Patty. ( this was a work in time to accommodate daughter's request but advised this may not work for [employer]'s schedule).  Patty, please review appointment. I was concerned about this time slot and scheduling enough time but they are unable to come in the afternoon. Please advise.

## 2014-06-19 NOTE — Telephone Encounter (Signed)
Office notes received and to Dr Rica RecordsSilva's office.

## 2014-06-20 NOTE — Telephone Encounter (Signed)
Office notes reviewed.  Notes available for Carla [name] on her desk.

## 2014-06-20 NOTE — Progress Notes (Signed)
Patient is at risk for UTIs.  I recommend she continue with daily nitrofurantoin.  When she returns to our office in January, we can check a BUN and creatinine and reassess how she is doing.

## 2014-06-20 NOTE — Telephone Encounter (Signed)
Yes to come in on that day.  But next note states Delice Bisonara does not recommend a visit here unless symptoms.

## 2014-06-21 NOTE — Telephone Encounter (Signed)
Per Dr. Edward JollySilva, keep scheduled appointment and will review further with patient at that time.  Routing to provider for final review. Patient agreeable to disposition. Will close encounter

## 2014-07-11 ENCOUNTER — Encounter: Payer: Self-pay | Admitting: Nurse Practitioner

## 2014-07-11 ENCOUNTER — Ambulatory Visit (INDEPENDENT_AMBULATORY_CARE_PROVIDER_SITE_OTHER): Payer: Medicare Other | Admitting: Nurse Practitioner

## 2014-07-11 VITALS — BP 130/84 | HR 72 | Temp 97.5°F | Resp 16 | Ht 61.5 in | Wt 173.0 lb

## 2014-07-11 DIAGNOSIS — N289 Disorder of kidney and ureter, unspecified: Secondary | ICD-10-CM

## 2014-07-11 LAB — COMPREHENSIVE METABOLIC PANEL
AST: 16 U/L (ref 0–37)
Albumin: 3.6 g/dL (ref 3.5–5.2)
Alkaline Phosphatase: 91 U/L (ref 39–117)
BILIRUBIN TOTAL: 0.5 mg/dL (ref 0.2–1.2)
BUN: 21 mg/dL (ref 6–23)
CO2: 29 meq/L (ref 19–32)
Calcium: 9.2 mg/dL (ref 8.4–10.5)
Chloride: 103 mEq/L (ref 96–112)
Creat: 1.14 mg/dL — ABNORMAL HIGH (ref 0.50–1.10)
Glucose, Bld: 119 mg/dL — ABNORMAL HIGH (ref 70–99)
POTASSIUM: 4 meq/L (ref 3.5–5.3)
Sodium: 141 mEq/L (ref 135–145)
Total Protein: 7.4 g/dL (ref 6.0–8.3)

## 2014-07-11 NOTE — Patient Instructions (Signed)
CMP - kidney , liver, glucose

## 2014-07-15 ENCOUNTER — Encounter: Payer: Self-pay | Admitting: Nurse Practitioner

## 2014-07-15 NOTE — Progress Notes (Signed)
Patient ID: Carla FellerEarline S [name], female   DOB: [birth year], 81 y.o.   MRN: [id number] S:   This 81 yo WW Fe comes in with a friend mainly for a consultation visit.  She needs to have a repeat CMP as we are following her renal studies for her PCP.  Now with new diagnosis of rectal fistula noted in September and bladder fistula noted 06/13/14.   She denies any urinary symptoms, no fever or chills, no fatigue or abdominal pain.  She remains incontinent of urine.  She continues on maintenance dose of Macrodantin.  We feel that cath urine as we were doing are of no benefit as most of urine is coming out vaginally.   [facility] and has a team of MD's that will be following her. There is some concern that the fistula may be related to an underlying lesion / malignancy.  The surgical repair of the vesicovaginal fistula and the rectovaginal fistula would be aggressive and would need a cohort of surgeons.  She understands the risk given her age and co morbidities of renal insufficiency.  She has an extensive medical report to complete and needs information from us for completion.     To see Oncology- Gynecology of 07/25/14 - Dr. Leida LauthAndrew Berchuck  To see Urology of 07/31/14 Dr. Dorena BodoNgoc-Birch Phan Le  To see General Surgeon of 08/08/14 Dr. Pieter PartridgeJulie Kay [facility]   Plan:  Reviewed all of her questions and was able to refer back to paper chart from 02/2002.  Started with pessary 04/23/2002. Other questions about CT scan done 01/08/2013.  Rectal ultrasound done 01/08/2013.  Chest X ray:  08/27/12.  She will need to sign ROI to get these copies of these results.  CMP is done.  We are going to try and get information on CD for her to take with her about the CT, US, CXR.

## 2014-07-16 NOTE — Progress Notes (Signed)
Encounter reviewed by Dr. Brook Silva.  

## 2014-07-23 ENCOUNTER — Ambulatory Visit: Payer: Medicare Other | Admitting: Cardiology

## 2014-07-31 ENCOUNTER — Encounter: Payer: Self-pay | Admitting: Cardiology

## 2014-08-06 ENCOUNTER — Ambulatory Visit: Payer: Medicare Other | Admitting: Cardiology

## 2014-08-07 ENCOUNTER — Ambulatory Visit (INDEPENDENT_AMBULATORY_CARE_PROVIDER_SITE_OTHER): Payer: Medicare Other | Admitting: Cardiology

## 2014-08-07 ENCOUNTER — Encounter: Payer: Self-pay | Admitting: Cardiology

## 2014-08-07 VITALS — BP 130/82 | HR 78 | Ht 62.5 in | Wt 167.5 lb

## 2014-08-07 DIAGNOSIS — I480 Paroxysmal atrial fibrillation: Secondary | ICD-10-CM

## 2014-08-07 MED ORDER — AMIODARONE HCL 200 MG PO TABS: 100.0000 mg | ORAL_TABLET | Freq: Every day | ORAL | Status: DC

## 2014-08-07 NOTE — Patient Instructions (Signed)
Your physician wants you to follow-up in: 6 Months with Dr Jordan You will receive a reminder letter in the mail two months in advance. If you don't receive a letter, please call our office to schedule the follow-up appointment.  

## 2014-08-07 NOTE — Progress Notes (Signed)
08/07/2014 Carla FellerEarline S [name]   [birth year]  [id number]  Primary Physician Eartha InchBADGER,MICHAEL C, MD Primary Cardiologist: Dr. SwazilandJordan  Reason for follow-up/CC: Routine follow-up for atrial fibrillation  HPI:  The patient is a 81 year old female, followed by Dr. SwazilandJordan, who presents to clinic today for routine evaluation. Cardiovascular history is significant atrial fibrillation which first occurred in February 2014 in the setting of urosepsis. She was placed on amiodarone and converted to normal sinus rhythm. Echocardiogram at that time showed mild right ventricular and right atrial enlargement. She was also noted to have moderate pulmonary hypertension. She has since been maintained on PO amiodarone at a low dose of 100 mg daily. She is not on oral anticoagulation due to her history of GI bleeding. In July 2014 she underwent an upper endoscopy which demonstrated a massive hiatal hernia with esophagitis and gastritis. She has had good success with amiodarone and has remained in sinus rhythm. Her EKG today demonstrates normal sinus rhythm with a ventricular rate of 78 bpm. She denies any awareness of breakthrough atrial fibrillation. She denies any signs and symptoms concerning for stroke/TIA. Her history is also notable for hypothyroidism which developed after initiation of amiodarone. She is on Synthroid. This is followed by her PCP.  She presents for follow-up today. She is accompanied by her daughter. She states that she has done well since her last office visit with Dr. SwazilandJordan. She denies any chest pain, dyspnea, orthopnea, PND, lower extremity palpitations, lightheadedness, dizziness, syncope/near-syncope. She reports full medication compliance. She denies any stroke-like symptoms.    Current Outpatient Prescriptions  Medication Sig Dispense Refill  . ACETAMINOPHEN PO Take 1,000 mg by mouth daily.    Marland Kitchen. amiodarone (PACERONE) 200 MG tablet Take 0.5 tablets (100 mg total) by mouth daily. 45 tablet 1    . Ascorbic Acid (VITAMIN C) 1000 MG tablet Take 1,000 mg by mouth daily.    . Calcium Carb-Cholecalciferol (CALCIUM-VITAMIN D) 600-400 MG-UNIT TABS Take 1 tablet by mouth daily.    . Cholecalciferol (VITAMIN D) 2000 UNITS CAPS Take 1 capsule by mouth daily.    Marland Kitchen. docusate sodium (COLACE) 100 MG capsule Take 100 mg by mouth 3 (three) times daily.     Marland Kitchen. levothyroxine (SYNTHROID, LEVOTHROID) 75 MCG tablet Take 75 mcg by mouth daily before breakfast.    . nitrofurantoin (MACRODANTIN) 50 MG capsule 1 tablet daily 30 capsule 2  . nystatin-triamcinolone ointment (MYCOLOG) Apply 1 application topically 2 (two) times daily. 60 g 4  . pantoprazole (PROTONIX) 40 MG tablet Take 40 mg by mouth daily.    Marland Kitchen. PREMARIN vaginal cream PLACE HALF APPLICATORFUL VAGINALLY THREE TIMES A WEEK 30 g 3   No current facility-administered medications for this visit.    No Known Allergies  History   Social History  . Marital Status: Widowed    Spouse Name: N/A    Number of Children: 4  . Years of Education: N/A   Occupational History  . Retired Chartered loss adjusterschoolteacher    Social History Main Topics  . Smoking status: Never Smoker   . Smokeless tobacco: Never Used  . Alcohol Use: No  . Drug Use: No  . Sexual Activity: No   Other Topics Concern  . Not on file   Social History Narrative   Widowed.  Lives alone.  Uses a cane/walker to ambulate but independent of ADLs.     Review of Systems: General: negative for chills, fever, night sweats or weight changes.  Cardiovascular: negative for chest pain, dyspnea  on exertion, edema, orthopnea, palpitations, paroxysmal nocturnal dyspnea or shortness of breath Dermatological: negative for rash Respiratory: negative for cough or wheezing Urologic: negative for hematuria Abdominal: negative for nausea, vomiting, diarrhea, bright red blood per rectum, melena, or hematemesis Neurologic: negative for visual changes, syncope, or dizziness All other systems reviewed and are  otherwise negative except as noted above.    Blood pressure 130/82, pulse 78, height 5' 2.5" (1.588 m), weight 167 lb 8 oz (75.978 kg).  General appearance: alert, cooperative and no distress Neck: no carotid bruit and no JVD Lungs: clear to auscultation bilaterally Heart: regular rate and rhythm, S1, S2 normal, no murmur, click, rub or gallop Extremities: no LEE Pulses: 2+ and symmetric Skin: warm and dry Neurologic: Grossly normal  EKG NSR. HR 78 bpm.   ASSESSMENT AND PLAN:   1. Paroxysmal atrial fibrillation: EKG today demonstrates normal sinus rhythm. Continue low-dose amiodarone. She is not a candidate for oral anticoagulation due to a history of GI bleeding.  2. Amiodarone monitoring: A comprehensive metabolic panel was recently ordered by her PCP less than one month ago. LFTs were normal. TSH is followed by her family physician. This was last checked less than 6 months ago. She has known hypothyroidism and is on treatment with Synthroid. She denies any dyspnea or wheezing.    PLAN  patient is stable from a cardiovascular standpoint. No change in medical therapy at this time. She has been instructed to follow-up with Dr. Swaziland in 6 months for repeat evaluation.  SIMMONS, BRITTAINYPA-C 08/07/2014 10:22 AM

## 2014-08-23 ENCOUNTER — Telehealth: Payer: Self-pay | Admitting: *Deleted

## 2014-08-23 NOTE — Telephone Encounter (Signed)
Per Dr Edward JollySilva request, [facility] to request office notes from referral to Dr Varney BaasKawasaki. Transferred to medical records. Even though we referred patient, request for office notes must be faxed to medical records [phone]. Request faxed.

## 2014-09-09 ENCOUNTER — Other Ambulatory Visit: Payer: Self-pay | Admitting: *Deleted

## 2014-09-09 MED ORDER — NITROFURANTOIN MACROCRYSTAL 50 MG PO CAPS: ORAL_CAPSULE | ORAL | Status: DC

## 2014-09-09 NOTE — Telephone Encounter (Addendum)
Patient notified- Rx sent to pharmacy. 

## 2014-09-09 NOTE — Telephone Encounter (Signed)
2nd request was faxed on 08-27-14 at 1055 am. 3rd request for additional office notes, all notes since referral (not just the one visit with Dr Varney BaasKawasaki)  [facility] at 1715.

## 2014-09-09 NOTE — Telephone Encounter (Signed)
Medication refill request: Macrodantin 50 mg Last AEX:  ? Next AEX: ? Last MMG (if hormonal medication request): 10/02/11 BIRADS2:Benign  Refill authorized: 06/13/14 #[id number]. Today please advise.

## 2014-09-19 NOTE — Telephone Encounter (Signed)
With new Care Everywhere agreement, was able to print records. Sent to your office for review. Anything else needed or ok to close encounter?

## 2014-09-29 NOTE — Telephone Encounter (Signed)
We have received a copy of the [facility]'s [facility].  I have closed the encounter.

## 2014-11-01 ENCOUNTER — Telehealth: Payer: Self-pay | Admitting: Nurse Practitioner

## 2014-11-01 NOTE — Telephone Encounter (Signed)
Update on pt.  She is doing well on Macrobid as a preventative med for UTI.  She is seeing [facility].  Her daughter died 08/09/14.  [place] [employer] [place] and will have all the family here in the near future.  Will return as needed.

## 2014-11-13 ENCOUNTER — Telehealth: Payer: Self-pay | Admitting: Cardiology

## 2014-11-13 NOTE — Telephone Encounter (Signed)
Closed encounter °

## 2015-02-27 ENCOUNTER — Encounter: Payer: Self-pay | Admitting: Cardiology

## 2015-02-27 ENCOUNTER — Ambulatory Visit (INDEPENDENT_AMBULATORY_CARE_PROVIDER_SITE_OTHER): Payer: Medicare Other | Admitting: Cardiology

## 2015-02-27 VITALS — BP 130/80 | HR 80 | Ht 62.0 in | Wt 169.9 lb

## 2015-02-27 DIAGNOSIS — I480 Paroxysmal atrial fibrillation: Secondary | ICD-10-CM

## 2015-02-27 DIAGNOSIS — E039 Hypothyroidism, unspecified: Secondary | ICD-10-CM | POA: Insufficient documentation

## 2015-02-27 NOTE — Progress Notes (Signed)
[name] Date of Birth: 1922-03-02 Medical Record #[id number]  History of Present Illness: Mrs. Mikey Bussing is seen today for followup. She was hospitalized in February 2014 with atrial fibrillation in the setting of urosepsis. She was placed on amiodarone and converted to normal sinus rhythm. Echocardiogram at that time showed mild right ventricular and right atrial enlargement. There was moderate pulmonary hypertension. In July 2014  Upper endoscopy demonstrated a massive hiatal hernia with esophagitis and gastritis. She does have a history of GI bleeding. She has been started on synthroid for hypothyroidism. She also has a history of vesicovaginal and vesicorectal fistulas. This is followed at [facility] and managed conservatively. On follow up today she denies any chest pain, SOB, or palpitations. She is not felt to be a candidate for anticoagulation due to GI bleed.  She gets around pretty well. Labs followed at [facility].   Current Outpatient Prescriptions on File Prior to Visit  Medication Sig Dispense Refill  . ACETAMINOPHEN PO Take 1,000 mg by mouth daily.    Marland Kitchen amiodarone (PACERONE) 200 MG tablet Take 0.5 tablets (100 mg total) by mouth daily. 45 tablet 3  . Calcium Carb-Cholecalciferol (CALCIUM-VITAMIN D) 600-400 MG-UNIT TABS Take 1 tablet by mouth daily.    Marland Kitchen docusate sodium (COLACE) 100 MG capsule Take 100 mg by mouth 3 (three) times daily.     Marland Kitchen levothyroxine (SYNTHROID, LEVOTHROID) 75 MCG tablet Take 75 mcg by mouth daily before breakfast.    . pantoprazole (PROTONIX) 40 MG tablet Take 40 mg by mouth daily.     No current facility-administered medications on file prior to visit.    Allergies  Allergen Reactions  . Bactrim [Sulfamethoxazole-Trimethoprim] Other (See Comments)    Has chronic kidney disease.  This is not recommended by nephrologist.    . Nsaids Other (See Comments)    Has chronic kidney disease.  This is not recommended by nephrologist.    Past  Medical History  Diagnosis Date  . Iron deficiency anemia   . High cholesterol   . Osteoarthritis   . Female bladder prolapse   . Osteoporosis   . Cataracts, bilateral   . Gallstones   . Hemorrhoids   . Diverticulosis   . Hiatal hernia   . Atrial fibrillation   . Cystocele   . Rectocele   . Scoliosis   . Lordosis   . Hyperlipidemia   . Osteoarthritis   . Dysrhythmia     A- fib  . Pneumonia     hx aspiration pneumonia  . Hypothyroidism   . Chronic kidney disease     chronic kidney disease  . Encounter for fitting and adjustment of pessary 04/23/2002    Past Surgical History  Procedure Laterality Date  . Cholecystectomy    . Cataract extraction    . Esophagogastroduodenoscopy    . Colonoscopy    . Endometrial biopsy  09/2009  . Esophagogastroduodenoscopy N/A 01/09/2013    Procedure: ESOPHAGOGASTRODUODENOSCOPY (EGD);  Surgeon: Vertell Novak., MD;  Location: [facility];  Service: Endoscopy;  Laterality: N/A;  . Rectal ultrasound N/A 04/10/2014    Procedure: RECTAL ULTRASOUND;  Surgeon: Romie Levee, MD;  Location: [facility];  Service: Endoscopy;  Laterality: N/A;    History  Smoking status  . Never Smoker   Smokeless tobacco  . Never Used    History  Alcohol Use No    Family History  Problem Relation Age of Onset  . Breast cancer Paternal Aunt   . Breast  cancer Paternal Aunt   . Cancer      several family members on dad's side of family  . Diabetes Father   . Leukemia Father   . Heart failure Brother     Pt cannot confirm this history    Review of Systems: As noted in history of present illness.  All other systems were reviewed and are negative.  Physical Exam: BP 130/80 mmHg  Pulse 80  Ht 5\' 2"  (1.575 m)  Wt 77.066 kg (169 lb 14.4 oz)  BMI 31.07 kg/m2 She is a pleasant white female in no acute distress. HEENT is unremarkable. Lungs: Clear Cardiovascular: Regular rate and rhythm without gallop, murmur, or click. Abdomen: Soft and  nontender. Bowel sounds positive. No masses or bruits. Extremities: Pulses are 2+ and symmetric. She has no edema. Neuro: Nonfocal.  LABORATORY DATA:   Assessment / Plan: 1. Atrial fibrillation- paroxysmal. This initially occurred during an episode of urosepsis. Given increased risk of bleeding on anticoagulation with GI findings I recommend trying to maintain NSR with amiodarone as the best way to reduce risk of CVA. She is tolerating this well. Continue 100 mg daily. Follow up LFTs and TSH every 6 months. 2. Hypertension-controlled.  3. Large hiatal hernia with esophagitis and gastritis. She is a poor candidate for anticoagulation.

## 2015-02-27 NOTE — Patient Instructions (Signed)
Continue your current therapy  I will see you in 6 months.   

## 2015-07-22 LAB — HM DEXA SCAN

## 2015-08-13 ENCOUNTER — Other Ambulatory Visit: Payer: Self-pay | Admitting: Cardiology

## 2015-08-13 MED FILL — NITROFURANTOIN MCR 50 MG CA: 50 | 30 days supply | Qty: 30 | Fill #1 | Status: TO

## 2015-08-13 NOTE — Telephone Encounter (Signed)
Rx(s) sent to pharmacy electronically.  

## 2015-08-15 ENCOUNTER — Encounter (HOSPITAL_COMMUNITY): Payer: Self-pay | Admitting: Nurse Practitioner

## 2015-08-15 ENCOUNTER — Emergency Department (HOSPITAL_COMMUNITY): Payer: Medicare Other

## 2015-08-15 ENCOUNTER — Inpatient Hospital Stay (HOSPITAL_COMMUNITY)
Admission: EM | Admit: 2015-08-15 | Discharge: 2015-08-22 | DRG: 166 | Disposition: A | Discharge status: Home Health | Payer: Medicare Other | Attending: Internal Medicine | Admitting: Internal Medicine

## 2015-08-15 DIAGNOSIS — E039 Hypothyroidism, unspecified: Secondary | ICD-10-CM | POA: Diagnosis present

## 2015-08-15 DIAGNOSIS — M7989 Other specified soft tissue disorders: Secondary | ICD-10-CM | POA: Diagnosis present

## 2015-08-15 DIAGNOSIS — Z9841 Cataract extraction status, right eye: Secondary | ICD-10-CM

## 2015-08-15 DIAGNOSIS — J9601 Acute respiratory failure with hypoxia: Secondary | ICD-10-CM

## 2015-08-15 DIAGNOSIS — I4891 Unspecified atrial fibrillation: Secondary | ICD-10-CM | POA: Diagnosis present

## 2015-08-15 DIAGNOSIS — I2609 Other pulmonary embolism with acute cor pulmonale: Secondary | ICD-10-CM | POA: Diagnosis present

## 2015-08-15 DIAGNOSIS — E038 Other specified hypothyroidism: Secondary | ICD-10-CM | POA: Diagnosis not present

## 2015-08-15 DIAGNOSIS — I2699 Other pulmonary embolism without acute cor pulmonale: Secondary | ICD-10-CM | POA: Diagnosis present

## 2015-08-15 DIAGNOSIS — Z803 Family history of malignant neoplasm of breast: Secondary | ICD-10-CM

## 2015-08-15 DIAGNOSIS — L89151 Pressure ulcer of sacral region, stage 1: Secondary | ICD-10-CM | POA: Diagnosis present

## 2015-08-15 DIAGNOSIS — I248 Other forms of acute ischemic heart disease: Secondary | ICD-10-CM | POA: Diagnosis present

## 2015-08-15 DIAGNOSIS — Z833 Family history of diabetes mellitus: Secondary | ICD-10-CM

## 2015-08-15 DIAGNOSIS — R0602 Shortness of breath: Secondary | ICD-10-CM

## 2015-08-15 DIAGNOSIS — I2602 Saddle embolus of pulmonary artery with acute cor pulmonale: Secondary | ICD-10-CM | POA: Diagnosis not present

## 2015-08-15 DIAGNOSIS — Z9842 Cataract extraction status, left eye: Secondary | ICD-10-CM

## 2015-08-15 DIAGNOSIS — M1990 Unspecified osteoarthritis, unspecified site: Secondary | ICD-10-CM | POA: Diagnosis present

## 2015-08-15 DIAGNOSIS — I82411 Acute embolism and thrombosis of right femoral vein: Secondary | ICD-10-CM | POA: Diagnosis present

## 2015-08-15 DIAGNOSIS — R799 Abnormal finding of blood chemistry, unspecified: Secondary | ICD-10-CM | POA: Diagnosis not present

## 2015-08-15 DIAGNOSIS — I482 Chronic atrial fibrillation: Secondary | ICD-10-CM | POA: Diagnosis present

## 2015-08-15 DIAGNOSIS — R0902 Hypoxemia: Secondary | ICD-10-CM | POA: Insufficient documentation

## 2015-08-15 DIAGNOSIS — Z806 Family history of leukemia: Secondary | ICD-10-CM

## 2015-08-15 DIAGNOSIS — N184 Chronic kidney disease, stage 4 (severe): Secondary | ICD-10-CM | POA: Diagnosis present

## 2015-08-15 DIAGNOSIS — I2601 Septic pulmonary embolism with acute cor pulmonale: Secondary | ICD-10-CM | POA: Diagnosis not present

## 2015-08-15 DIAGNOSIS — I2690 Septic pulmonary embolism without acute cor pulmonale: Secondary | ICD-10-CM | POA: Diagnosis not present

## 2015-08-15 DIAGNOSIS — E785 Hyperlipidemia, unspecified: Secondary | ICD-10-CM | POA: Diagnosis present

## 2015-08-15 DIAGNOSIS — I272 Other secondary pulmonary hypertension: Secondary | ICD-10-CM | POA: Diagnosis present

## 2015-08-15 DIAGNOSIS — Z888 Allergy status to other drugs, medicaments and biological substances status: Secondary | ICD-10-CM

## 2015-08-15 DIAGNOSIS — M419 Scoliosis, unspecified: Secondary | ICD-10-CM | POA: Diagnosis present

## 2015-08-15 DIAGNOSIS — Z0000 Encounter for general adult medical examination without abnormal findings: Secondary | ICD-10-CM

## 2015-08-15 DIAGNOSIS — R7989 Other specified abnormal findings of blood chemistry: Secondary | ICD-10-CM | POA: Diagnosis not present

## 2015-08-15 DIAGNOSIS — Z8249 Family history of ischemic heart disease and other diseases of the circulatory system: Secondary | ICD-10-CM | POA: Diagnosis not present

## 2015-08-15 DIAGNOSIS — L8990 Pressure ulcer of unspecified site, unspecified stage: Secondary | ICD-10-CM | POA: Insufficient documentation

## 2015-08-15 DIAGNOSIS — R778 Other specified abnormalities of plasma proteins: Secondary | ICD-10-CM | POA: Diagnosis present

## 2015-08-15 DIAGNOSIS — I480 Paroxysmal atrial fibrillation: Secondary | ICD-10-CM | POA: Diagnosis not present

## 2015-08-15 DIAGNOSIS — M810 Age-related osteoporosis without current pathological fracture: Secondary | ICD-10-CM | POA: Diagnosis present

## 2015-08-15 DIAGNOSIS — Z6832 Body mass index (BMI) 32.0-32.9, adult: Secondary | ICD-10-CM

## 2015-08-15 HISTORY — DX: Disorder of kidney and ureter, unspecified: N28.9

## 2015-08-15 LAB — CBC WITH DIFFERENTIAL/PLATELET
Basophils Absolute: 0 10*3/uL (ref 0.0–0.1)
Basophils Relative: 0 %
EOS ABS: 0 10*3/uL (ref 0.0–0.7)
Eosinophils Relative: 0 %
HEMATOCRIT: 42.8 % (ref 36.0–46.0)
Hemoglobin: 13.6 g/dL (ref 12.0–15.0)
LYMPHS ABS: 0.9 10*3/uL (ref 0.7–4.0)
LYMPHS PCT: 9 %
MCH: 27.9 pg (ref 26.0–34.0)
MCHC: 31.8 g/dL (ref 30.0–36.0)
MCV: 87.7 fL (ref 78.0–100.0)
Monocytes Absolute: 1.2 10*3/uL — ABNORMAL HIGH (ref 0.1–1.0)
Monocytes Relative: 11 %
NEUTROS ABS: 8.6 10*3/uL — AB (ref 1.7–7.7)
NEUTROS PCT: 80 %
Platelets: 204 10*3/uL (ref 150–400)
RBC: 4.88 MIL/uL (ref 3.87–5.11)
RDW: 18.3 % — ABNORMAL HIGH (ref 11.5–15.5)
WBC: 10.8 10*3/uL — ABNORMAL HIGH (ref 4.0–10.5)

## 2015-08-15 LAB — I-STAT CHEM 8, ED
BUN: 19 mg/dL (ref 6–20)
CHLORIDE: 103 mmol/L (ref 101–111)
CREATININE: 1.1 mg/dL — AB (ref 0.44–1.00)
Calcium, Ion: 0.98 mmol/L — ABNORMAL LOW (ref 1.13–1.30)
Glucose, Bld: 128 mg/dL — ABNORMAL HIGH (ref 65–99)
HCT: 48 % — ABNORMAL HIGH (ref 36.0–46.0)
Hemoglobin: 16.3 g/dL — ABNORMAL HIGH (ref 12.0–15.0)
Potassium: 3.9 mmol/L (ref 3.5–5.1)
SODIUM: 142 mmol/L (ref 135–145)
TCO2: 29 mmol/L (ref 0–100)

## 2015-08-15 LAB — I-STAT TROPONIN, ED: Troponin i, poc: 0.68 ng/mL (ref 0.00–0.08)

## 2015-08-15 LAB — BRAIN NATRIURETIC PEPTIDE: B Natriuretic Peptide: 295.4 pg/mL — ABNORMAL HIGH (ref 0.0–100.0)

## 2015-08-15 MED ORDER — ONDANSETRON HCL 4 MG PO TABS: 4.0000 mg | ORAL_TABLET | Freq: Four times a day (QID) | ORAL | Status: DC | PRN

## 2015-08-15 MED ORDER — ACETAMINOPHEN ER 650 MG PO TBCR: 650.0000 mg | EXTENDED_RELEASE_TABLET | Freq: Two times a day (BID) | ORAL | Status: DC

## 2015-08-15 MED ORDER — CALCIUM CARBONATE-VITAMIN D 500-200 MG-UNIT PO TABS
1.0000 | ORAL_TABLET | Freq: Two times a day (BID) | ORAL | Status: DC
  Administered 2015-08-16 – 2015-08-22 (×13): 1 via ORAL
  Filled 2015-08-15 (×13): qty 1

## 2015-08-15 MED ORDER — ASPIRIN 81 MG PO CHEW
324.0000 mg | CHEWABLE_TABLET | Freq: Once | ORAL | Status: AC
  Administered 2015-08-15: 324 mg via ORAL
  Filled 2015-08-15: qty 4

## 2015-08-15 MED ORDER — DOCUSATE SODIUM 100 MG PO CAPS
100.0000 mg | ORAL_CAPSULE | Freq: Two times a day (BID) | ORAL | Status: DC
  Administered 2015-08-16 – 2015-08-21 (×3): 100 mg via ORAL
  Filled 2015-08-15 (×8): qty 1

## 2015-08-15 MED ORDER — IOHEXOL 350 MG/ML SOLN
80.0000 mL | Freq: Once | INTRAVENOUS | Status: AC | PRN
  Administered 2015-08-15: 100 mL via INTRAVENOUS

## 2015-08-15 MED ORDER — HEPARIN BOLUS VIA INFUSION
4000.0000 [IU] | Freq: Once | INTRAVENOUS | Status: AC
  Administered 2015-08-15: 4000 [IU] via INTRAVENOUS
  Filled 2015-08-15: qty 4000

## 2015-08-15 MED ORDER — LEVOTHYROXINE SODIUM 75 MCG PO TABS
75.0000 ug | ORAL_TABLET | Freq: Every day | ORAL | Status: DC
  Administered 2015-08-16 – 2015-08-22 (×7): 75 ug via ORAL
  Filled 2015-08-15 (×7): qty 1

## 2015-08-15 MED ORDER — PANTOPRAZOLE SODIUM 40 MG PO TBEC
40.0000 mg | DELAYED_RELEASE_TABLET | Freq: Every day | ORAL | Status: DC
  Administered 2015-08-16 – 2015-08-22 (×7): 40 mg via ORAL
  Filled 2015-08-15 (×7): qty 1

## 2015-08-15 MED ORDER — SODIUM CHLORIDE 0.9 % IV BOLUS (SEPSIS)
500.0000 mL | Freq: Once | INTRAVENOUS | Status: AC
  Administered 2015-08-15: 500 mL via INTRAVENOUS

## 2015-08-15 MED ORDER — ACETAMINOPHEN 325 MG PO TABS
650.0000 mg | ORAL_TABLET | Freq: Two times a day (BID) | ORAL | Status: DC
  Administered 2015-08-16 – 2015-08-22 (×14): 650 mg via ORAL
  Filled 2015-08-15 (×14): qty 2

## 2015-08-15 MED ORDER — HEPARIN (PORCINE) IN NACL 100-0.45 UNIT/ML-% IJ SOLN
1100.0000 [IU]/h | INTRAMUSCULAR | Status: DC
  Administered 2015-08-15 – 2015-08-16 (×2): 1200 [IU]/h via INTRAVENOUS
  Filled 2015-08-15 (×2): qty 250

## 2015-08-15 MED ORDER — ONDANSETRON HCL 4 MG/2ML IJ SOLN: 4.0000 mg | Freq: Four times a day (QID) | INTRAMUSCULAR | Status: DC | PRN

## 2015-08-15 MED ORDER — CALCIUM-VITAMIN D 600-400 MG-UNIT PO TABS: 1.0000 | ORAL_TABLET | Freq: Two times a day (BID) | ORAL | Status: DC

## 2015-08-15 MED ORDER — AMIODARONE HCL 200 MG PO TABS
100.0000 mg | ORAL_TABLET | Freq: Every day | ORAL | Status: DC
  Administered 2015-08-16 – 2015-08-22 (×7): 100 mg via ORAL
  Filled 2015-08-15 (×7): qty 1

## 2015-08-15 MED ORDER — NITROFURANTOIN MACROCRYSTAL 50 MG PO CAPS
50.0000 mg | ORAL_CAPSULE | Freq: Every day | ORAL | Status: DC
  Administered 2015-08-16: 50 mg via ORAL
  Filled 2015-08-15: qty 1

## 2015-08-15 NOTE — ED Provider Notes (Signed)
CSN: [id number]     Arrival date & time 08/15/15  1449 History   First MD Initiated Contact with Patient 08/15/15 1523     Chief Complaint  Patient presents with  . Shortness of Breath     (Consider location/radiation/quality/duration/timing/severity/associated sxs/prior Treatment) HPI   [age]-year-old female issue of atrial fibrillation, chronic kidney disease, iron deficiency anemia brought here via EMS for evaluation of shortness of breath. Patient report she has had progressive worsening shortness of breath for the past several months but with acute worsening shortness of breath this morning. Patient states she woke up and when getting out of her recliner chair she became shortness of breath symptoms seems to improve while sitting and resting. There are no associated fever, productive cough, pleuritic chest pain, abdominal pain, exertional chest pain, diaphoresis, nausea, lightheadedness or dizziness. She did report having a vague achy pain across her upper back this morning lasting for a few minutes and has since resolved. She admits to having edema to her lower extremities but no increasing peripheral edema. She did have a sinus infection a month ago with productive cough but that shortly resolved after receiving antibiotic. She went to urgent care earlier today for her symptoms but was sent here to the ED for further evaluation. She did receive a breathing treatment at urgent care and felt to provide relief. She denies having wheezing. No history of CHF, she is not home supplemental O2 dependent.  Past Medical History  Diagnosis Date  . Iron deficiency anemia   . High cholesterol   . Osteoarthritis   . Female bladder prolapse   . Osteoporosis   . Cataracts, bilateral   . Gallstones   . Hemorrhoids   . Diverticulosis   . Hiatal hernia   . Atrial fibrillation (HCC)   . Cystocele   . Rectocele   . Scoliosis   . Lordosis   . Hyperlipidemia   . Osteoarthritis   . Dysrhythmia      A- fib  . Pneumonia     hx aspiration pneumonia  . Hypothyroidism   . Chronic kidney disease     chronic kidney disease  . Encounter for fitting and adjustment of pessary 04/23/2002   Past Surgical History  Procedure Laterality Date  . Cholecystectomy    . Cataract extraction    . Esophagogastroduodenoscopy    . Colonoscopy    . Endometrial biopsy  09/2009  . Esophagogastroduodenoscopy N/A 01/09/2013    Procedure: ESOPHAGOGASTRODUODENOSCOPY (EGD);  Surgeon: Vertell Novak., MD;  Location: [facility];  Service: Endoscopy;  Laterality: N/A;  . Rectal ultrasound N/A 04/10/2014    Procedure: RECTAL ULTRASOUND;  Surgeon: Romie Levee, MD;  Location: [facility];  Service: Endoscopy;  Laterality: N/A;   Family History  Problem Relation Age of Onset  . Breast cancer Paternal Aunt   . Breast cancer Paternal Aunt   . Cancer      several family members on dad's side of family  . Diabetes Father   . Leukemia Father   . Heart failure Brother     Pt cannot confirm this history   Social History  Substance Use Topics  . Smoking status: Never Smoker   . Smokeless tobacco: Never Used  . Alcohol Use: No   OB History    Gravida Para Term Preterm AB TAB SAB Ectopic Multiple Living   4 4        4      Review of Systems  All other systems reviewed and  are negative.     Allergies  Bactrim and Nsaids  Home Medications   Prior to Admission medications   Medication Sig Start Date End Date Taking? Authorizing Provider  ACETAMINOPHEN PO Take 1,000 mg by mouth daily.    Historical Provider, MD  amiodarone (PACERONE) 200 MG tablet TAKE 1/2 TABLET BY MOUTH DAILY 08/13/15   Peter M [place], MD  Calcium Carb-Cholecalciferol (CALCIUM-VITAMIN D) 600-400 MG-UNIT TABS Take 1 tablet by mouth daily.    Historical Provider, MD  docusate sodium (COLACE) 100 MG capsule Take 100 mg by mouth 3 (three) times daily.     Historical Provider, MD  levothyroxine (SYNTHROID, LEVOTHROID) 75 MCG tablet  Take 75 mcg by mouth daily before breakfast.    Historical Provider, MD  nitrofurantoin (MACRODANTIN) 50 MG capsule Take 50 mg by mouth. 12/27/14   Historical Provider, MD  pantoprazole (PROTONIX) 40 MG tablet Take 40 mg by mouth daily. 01/10/13   Dorothea Ogle, MD   BP 144/83 mmHg  Pulse 115  Temp(Src) 97.6 F (36.4 C) (Oral)  Ht  (1.575 m)  Wt 79.379 kg  BMI 32.00 kg/m2  SpO2 94% Physical Exam  Constitutional: She is oriented to person, place, and time. She appears well-developed and well-nourished. No distress.  Elderly Caucasian female resting in bed comfortably in no acute respiratory distress.  HENT:  Head: Atraumatic.  Mouth/Throat: Oropharynx is clear and moist.  Eyes: Conjunctivae are normal.  Neck: Neck supple. No JVD present.  Cardiovascular:  Irregularly irregularly heart rhythm without murmurs rubs or gallops  Pulmonary/Chest:  Decreased breath sounds without obvious wheezes, rales, or rhonchi heard.  Abdominal: Soft. There is no tenderness.  Musculoskeletal: She exhibits edema (trace pitting edema to bilateral lower extremities with intact distal pulses.).  Neurological: She is alert and oriented to person, place, and time.  Skin: No rash noted.  Psychiatric: She has a normal mood and affect.  Nursing note and vitals reviewed.   ED Course  Procedures (including critical care time) Labs Review Labs Reviewed  CBC WITH DIFFERENTIAL/PLATELET - Abnormal; Notable for the following:    WBC 10.8 (*)    RDW 18.3 (*)    Neutro Abs 8.6 (*)    Monocytes Absolute 1.2 (*)    All other components within normal limits  BRAIN NATRIURETIC PEPTIDE - Abnormal; Notable for the following:    B Natriuretic Peptide 295.4 (*)    All other components within normal limits  I-STAT CHEM 8, ED - Abnormal; Notable for the following:    Creatinine, Ser 1.10 (*)    Glucose, Bld 128 (*)    Calcium, Ion 0.98 (*)    Hemoglobin 16.3 (*)    HCT 48.0 (*)    All other components within  normal limits  I-STAT TROPOININ, ED - Abnormal; Notable for the following:    Troponin i, poc 0.68 (*)    All other components within normal limits  HEPARIN LEVEL (UNFRACTIONATED)  CBC  HEPARIN LEVEL (UNFRACTIONATED)    Imaging Review Dg Chest 2 View  08/15/2015  CLINICAL DATA:  Acute shortness of breath since waking up this morning. EXAM: CHEST  2 VIEW COMPARISON:  01/08/2013 FINDINGS: The cardiomediastinal silhouette is unchanged, with a large hiatal hernia again seen. There remains elevation of the right hemidiaphragm. Chronic diffuse interstitial coarsening and more focal scarring or atelectasis in the left lung base do not appear significantly changed. There is no evidence of acute airspace consolidation, overt edema, pleural effusion, or pneumothorax. Diffuse aortic calcification  is noted. No acute osseous abnormality is seen. IMPRESSION: Stable appearance of the chest without evidence of active cardiopulmonary disease. Electronically Signed   By: Sebastian Ache M.D.   On: 08/15/2015 15:33   Ct Angio Chest Pe W/cm &/or Wo Cm  08/15/2015  CLINICAL DATA:  Shortness of breath and chest pain for 1 week EXAM: CT ANGIOGRAPHY CHEST WITH CONTRAST TECHNIQUE: Multidetector CT imaging of the chest was performed using the standard protocol during bolus administration of intravenous contrast. Multiplanar CT image reconstructions and MIPs were obtained to evaluate the vascular anatomy. CONTRAST:  OMNIPAQUE IOHEXOL 350 MG/ML SOLN COMPARISON:  Chest CT August 21, 2012; chest radiograph August 15, 2015 FINDINGS: There is extensive pulmonary embolism with pulmonary emboli arising from each main pulmonary artery and extending into upper and proximal lower lobe pulmonary arteries bilaterally. There is more pulmonary embolus on the right than on the left with extensive pulmonary embolus extending into several right lower lobe pulmonary branches as well as into the right middle and right upper lobe pulmonary  branches. There is right heart strain with the right ventricle to left ventricle diameter ratio of 1.5, normal less than 0.9. There is no thoracic aortic aneurysm or dissection. There is atherosclerotic change throughout much of the aorta. The visualized great vessels appear normal except for moderate atherosclerotic calcification at the origin of the left subclavian artery. There is left ventricular hypertrophy. The pericardium is not thickened. There are foci of coronary artery calcification. There is a small calcified granuloma in the lateral left base. There is a focal area of ground-glass opacity in the periphery of the anterior segment of the left upper lobe measuring 0.8 x 0.7 cm. There is similar-appearing ground-glass type opacity in the anterior left apex measuring 1.2 x 0.8 cm. Thyroid appears normal. There is no appreciable thoracic adenopathy. There is a sizable hiatal type hernia with most of the stomach above the diaphragm. In the visualized upper abdomen, there is atherosclerotic calcification in the aorta. Gallbladder is absent. Incomplete visualization of the pancreas suggests that there is moderate fatty infiltration in the pancreas. There is contrast in portions of the inferior vena cava and hepatic arteries. There is degenerative change in the thoracic spine with increase in kyphosis. There are no blastic or lytic bone lesions. Review of the MIP images confirms the above findings. IMPRESSION: Positive for acute PE with CT evidence of right heart strain (RV/LV Ratio = 1.5) consistent with at least submassive (intermediate risk) PE. The presence of right heart strain has been associated with an increased risk of morbidity and mortality. Please activate Code PE by paging [phone]. Sizable hiatal hernia with most of the stomach above the diaphragm. Ground-glass opacities on the left, largest measuring 1.2 x 0.8 cm. Followup noncontrast enhanced CT in 3 months advised to assess for stability. It  should be noted that atypical neoplasm could present in this manner. There are foci of coronary artery calcification. There is left ventricular hypertrophy. No demonstrable adenopathy. Critical Value/emergent results were called by telephone at the time of interpretation on 08/15/2015 at 5:19 pm to [facility], who verbally acknowledged these results. Electronically Signed   By: Bretta Bang III M.D.   On: 08/15/2015 17:22   I have personally reviewed and evaluated these images and lab results as part of my medical decision-making.   EKG Interpretation None     ED ECG REPORT   Date: 08/15/2015  Rate: 106  Rhythm: atrial fibrillation  QRS Axis: left  Intervals: QT prolonged  ST/T Wave abnormalities: normal  Conduction Disutrbances:none  Narrative Interpretation:   Old EKG Reviewed: unchanged  I have personally reviewed the EKG tracing and agree with the computerized printout as noted.   MDM   Final diagnoses:  Other acute pulmonary embolism with acute cor pulmonale (HCC)    BP 143/91 mmHg  Pulse 111  Temp(Src) 97.6 F (36.4 C) (Oral)  Resp 22  Ht 5\' 2"  (1.575 m)  Wt 79.379 kg  BMI 32.00 kg/m2  SpO2 94%   3:44 PM Elderly female with progressive shortness of breath here for evaluation of shortness of breath. No active chest pain or anginal symptoms. Was documented that her O2 was at 85% on room air at urgent care and symptom seems to be improving after receiving a breathing treatment at the urgent care. She is currently on 2 L O2 via nasal cannula and resting comfortably with O2 sats in the mid 90s. She did have decreased lung sounds. She is afebrile. Workup initiated. History of A. fib with initial EKG showed mild tachycardia with A. Fib.  Care discussed with Dr. Rubin Payor.   4:07 PM Patient has no active chest pain however she does have an elevated troponin of 0.68. EKG without acute ischemic changes. Questionable atrial fibrillation. Chest x-ray show no acute  abnormalities. Evidence of mild renal insufficiency with a creatinine of 1.1. Since her primary complaint is shortness of breath, a chest CT angiogram will be obtained to rule out PE. She did complain of some vague upper back pain earlier today but that has since resolved. He does not appears that patient is having a aortic dissection as she is resting comfortably. When we got patient up to ambulate, sats drops down to the 80s once again. She became symptomatic. Patient will need to be admitted for further management once workup is complete.  5:24 PM Chest CT angio shows acute PE with evidence of right heart strain consistence with submassive PE. This is consistence with patient's presentation. Elevated BNP of 295.  Pt is currently stable and mentating appropriately.  I have initiated heparin per pharmacy.  Critical care was called, currently awaits call back.    5:53 PM Critical Care specialist Dr. Brett Canales Summer acknowledge pt's condition.  Recommend heparin and cardiac echo.  Pt to be admitted under his care.    8:32 PM Dr. Vaughan Basta will see pt and will be available for consultation.  Request medicine for admission.  I will consult our hospitalist for admission.    9:03 PM Appreciate consultation from our [facility] Dr. Robb Matar who agrees to admit pt for further care.   CRITICAL CARE Performed by: Fayrene Helper Total critical care time: 45 minutes Critical care time was exclusive of separately billable procedures and treating other patients. Critical care was necessary to treat or prevent imminent or life-threatening deterioration. Critical care was time spent personally by me on the following activities: development of treatment plan with patient and/or surrogate as well as nursing, discussions with consultants, evaluation of patient's response to treatment, examination of patient, obtaining history from patient or surrogate, ordering and performing treatments and interventions, ordering and  review of laboratory studies, ordering and review of radiographic studies, pulse oximetry and re-evaluation of patient's condition.    Fayrene Helper, PA-C 08/15/15 1910  Fayrene Helper, PA-C 08/15/15 [id number]  Benjiman Core, MD 08/16/15 [phone]

## 2015-08-15 NOTE — ED Notes (Signed)
Patient 2L [place] O2 removed SpO2 remained 90% for a moment then patient walked to room door and SpO2 dropped to 85%/RA with HR 140's. Patient walked back to bed and placed on 2L O2 [place] and instructed to take slow deep breaths- SpO2 initially continued to decrease to 80% then slowly begin to increase- maintained at 93-94%/2L O2

## 2015-08-15 NOTE — Consult Note (Signed)
ANTICOAGULATION CONSULT NOTE - Initial Consult  Pharmacy Consult for heparin Indication: pulmonary embolus  Patient Measurements: Height:  (157.5 cm) Weight: 175 lb (79.379 kg) IBW/kg (Calculated) : 50.1 Heparin Dosing Weight: 67.7 kg  Vital Signs: Temp: 97.6 F (36.4 C) (02/10 1445) Temp Source: Oral (02/10 1445) BP: 143/90 mmHg (02/10 1700) Pulse Rate: 105 (02/10 1700)  Labs:  Recent Labs  08/15/15 1545 08/15/15 1554  HGB 13.6 16.3*  HCT 42.8 48.0*  PLT 204  --   CREATININE  --  1.10*    Estimated Creatinine Clearance: 32.5 mL/min (by C-G formula based on Cr of 1.1).   Assessment: 82 yo female w/ afib found to have submassive PE with right heart strain. Pt reports worsening SOB for several months.  Pt is not on anticoagulation PTA.   Goal of Therapy:  Heparin level 0.3-0.7 units/ml Monitor platelets by anticoagulation protocol: Yes   Plan:  Give 4000 units bolus x 1 Start heparin infusion at 1200 units/hr Check anti-Xa level in 8 hours and daily while on heparin Continue to monitor H&H and platelets  Greggory Stallion, PharmD Clinical Pharmacy Resident Pager # [phone] 08/15/2015 5:46 PM

## 2015-08-15 NOTE — H&P (Signed)
[facility] History and Physical  Carla [name]:[id number] DOB: 1922-01-11 DOA: 08/15/2015  Referring physician: Fayrene Helper, PA-C PCP: [name], MD   Chief Complaint: Shortness of breath.  HPI: Carla [name] is a 82 y.o. female with a past medical history of atrial fibrillation, anemia, hyperlipidemia, osteoporosis, female bladder prolapse, cataracts, diverticulosis, hemorrhoids, gallstones, hypothyroidism comes to the ER with her daughter due to sudden onset of shortness of breath earlier this morning.  Per patient, shortly after she woke up, she developed sudden onset of chest pains. She is states that dyspnea was worsened by minimal activity. She denies chest pain, diaphoresis, nausea, but complains of mild dizziness, palpitations, occasional pitting edema of the lower extremities, but denies orthopnea or PND.  The patient states that she try to get an appointment with her PCP, but was unable to do so. She subsequently went to the urgent care center where her O2 sats were 80% on room air and she was found to be tachycardic. She was subsequently transferred to the emergency department, where workup has shown a pulmonary embolus and with right ventricle strain.  When seen, the patient was in no acute distress, she states that she feels comfortable with supplemental oxygen.  Review of Systems:  Constitutional:   fatigue.  No weight loss, night sweats, Fevers, chills. HEENT:  No headaches, Difficulty swallowing,Tooth/dental problems,Sore throat,  No sneezing, itching, ear ache, nasal congestion, post nasal drip,  Cardio-vascular:  As earlier mentioned GI:  loss of appetite  No heartburn, indigestion, abdominal pain, nausea, vomiting, diarrhea, change in bowel habits,  Resp:  Positive dyspnea. She denies productive cough, wheezing or hemoptysis.  Skin:  no rash or lesions.  GU:  no dysuria, change in color of urine, no urgency or frequency. No flank pain.   Musculoskeletal:  Occasional arthralgias or back pain. Psych:  No change in mood or affect. No depression or anxiety. No memory loss.   Past Medical History  Diagnosis Date  . Iron deficiency anemia   . High cholesterol   . Osteoarthritis   . Female bladder prolapse   . Osteoporosis   . Cataracts, bilateral   . Gallstones   . Hemorrhoids   . Diverticulosis   . Hiatal hernia   . Atrial fibrillation (HCC)   . Cystocele   . Rectocele   . Scoliosis   . Lordosis   . Hyperlipidemia   . Osteoarthritis   . Dysrhythmia     A- fib  . Pneumonia     hx aspiration pneumonia  . Hypothyroidism   . Chronic kidney disease     chronic kidney disease  . Encounter for fitting and adjustment of pessary 04/23/2002  . Renal insufficiency    Past Surgical History  Procedure Laterality Date  . Cholecystectomy    . Cataract extraction    . Esophagogastroduodenoscopy    . Colonoscopy    . Endometrial biopsy  09/2009  . Esophagogastroduodenoscopy N/A 01/09/2013    Procedure: ESOPHAGOGASTRODUODENOSCOPY (EGD);  Surgeon: Vertell Novak., MD;  Location: [facility];  Service: Endoscopy;  Laterality: N/A;  . Rectal ultrasound N/A 04/10/2014    Procedure: RECTAL ULTRASOUND;  Surgeon: Romie Levee, MD;  Location: [facility];  Service: Endoscopy;  Laterality: N/A;   Social History:  reports that she has never smoked. She has never used smokeless tobacco. She reports that she does not drink alcohol or use illicit drugs.  Allergies  Allergen Reactions  . Bactrim [Sulfamethoxazole-Trimethoprim] Other (See Comments)  Has chronic kidney disease.  This is not recommended by nephrologist.    . Nsaids Other (See Comments)    Has chronic kidney disease.  This is not recommended by nephrologist.    Family History  Problem Relation Age of Onset  . Breast cancer Paternal Aunt   . Breast cancer Paternal Aunt   . Cancer      several family members on dad's side of family  . Diabetes Father   .  Leukemia Father   . Heart failure Brother     Pt cannot confirm this history    Prior to Admission medications   Medication Sig Start Date End Date Taking? Authorizing Provider  acetaminophen (TYLENOL) 650 MG CR tablet Take 650 mg by mouth 2 (two) times daily.   Yes Historical Provider, MD  amiodarone (PACERONE) 200 MG tablet TAKE 1/2 TABLET BY MOUTH DAILY Patient taking differently: Take 100 mg by mouth daily. 08/13/15  Yes Peter M [place], MD  Calcium Carb-Cholecalciferol (CALCIUM-VITAMIN D) 600-400 MG-UNIT TABS Take 1 tablet by mouth 2 (two) times daily.    Yes Historical Provider, MD  docusate sodium (COLACE) 100 MG capsule Take 100 mg by mouth 2 (two) times daily.    Yes Historical Provider, MD  levothyroxine (SYNTHROID, LEVOTHROID) 75 MCG tablet Take 75 mcg by mouth daily before breakfast.   Yes Historical Provider, MD  nitrofurantoin (MACRODANTIN) 50 MG capsule Take 50 mg by mouth. 12/27/14  Yes Historical Provider, MD  pantoprazole (PROTONIX) 40 MG tablet Take 40 mg by mouth daily. 01/10/13  Yes Dorothea Ogle, MD   Physical Exam: Filed Vitals:   08/15/15 2030 08/15/15 2100 08/15/15 2200 08/15/15 2230  BP: 157/84 160/82 158/101 173/112  Pulse: 92 93 89 89  Temp:      TempSrc:      Resp: 25 22 25 28   Height:      Weight:      SpO2: 95% 96% 96% 95%    Wt Readings from Last 3 Encounters:  08/15/15 79.379 kg (175 lb)  02/27/15 77.066 kg (169 lb 14.4 oz)  08/07/14 75.978 kg (167 lb 8 oz)    General:  Appears calm and comfortable Eyes: PERRL, normal lids, irises & conjunctiva ENT: grossly normal hearing, lips & tongue Neck: no LAD, masses or thyromegaly Cardiovascular: Irregularly irregular, no m/r/g. Positive LE edema. Telemetry: Atrial fibrillation  Respiratory: Decreased breath sounds at the bases. No crackles, Rales, rhonchi or wheezing or accessory muscle use. Abdomen: soft, ntnd Skin: no rash or induration seen on limited exam Musculoskeletal: grossly normal tone  BUE/BLE Psychiatric: grossly normal mood and affect, speech fluent and appropriate Neurologic: Awake, alert, oriented 3, grossly non-focal.          Labs on Admission:  Basic Metabolic Panel:  Recent Labs Lab 08/15/15 1554  NA 142  K 3.9  CL 103  GLUCOSE 128*  BUN 19  CREATININE 1.10*   CBC:  Recent Labs Lab 08/15/15 1545 08/15/15 1554  WBC 10.8*  --   NEUTROABS 8.6*  --   HGB 13.6 16.3*  HCT 42.8 48.0*  MCV 87.7  --   PLT 204  --     BNP (last 3 results)  Recent Labs  08/15/15 1545  BNP 295.4*    Radiological Exams on Admission: Dg Chest 2 View  08/15/2015  CLINICAL DATA:  Acute shortness of breath since waking up this morning. EXAM: CHEST  2 VIEW COMPARISON:  01/08/2013 FINDINGS: The cardiomediastinal silhouette is unchanged, with a  large hiatal hernia again seen. There remains elevation of the right hemidiaphragm. Chronic diffuse interstitial coarsening and more focal scarring or atelectasis in the left lung base do not appear significantly changed. There is no evidence of acute airspace consolidation, overt edema, pleural effusion, or pneumothorax. Diffuse aortic calcification is noted. No acute osseous abnormality is seen. IMPRESSION: Stable appearance of the chest without evidence of active cardiopulmonary disease. Electronically Signed   By: Sebastian Ache M.D.   On: 08/15/2015 15:33   Ct Angio Chest Pe W/cm &/or Wo Cm  08/15/2015  CLINICAL DATA:  Shortness of breath and chest pain for 1 week EXAM: CT ANGIOGRAPHY CHEST WITH CONTRAST TECHNIQUE: Multidetector CT imaging of the chest was performed using the standard protocol during bolus administration of intravenous contrast. Multiplanar CT image reconstructions and MIPs were obtained to evaluate the vascular anatomy. CONTRAST:  OMNIPAQUE IOHEXOL 350 MG/ML SOLN COMPARISON:  Chest CT August 21, 2012; chest radiograph August 15, 2015 FINDINGS: There is extensive pulmonary embolism with pulmonary emboli  arising from each main pulmonary artery and extending into upper and proximal lower lobe pulmonary arteries bilaterally. There is more pulmonary embolus on the right than on the left with extensive pulmonary embolus extending into several right lower lobe pulmonary branches as well as into the right middle and right upper lobe pulmonary branches. There is right heart strain with the right ventricle to left ventricle diameter ratio of 1.5, normal less than 0.9. There is no thoracic aortic aneurysm or dissection. There is atherosclerotic change throughout much of the aorta. The visualized great vessels appear normal except for moderate atherosclerotic calcification at the origin of the left subclavian artery. There is left ventricular hypertrophy. The pericardium is not thickened. There are foci of coronary artery calcification. There is a small calcified granuloma in the lateral left base. There is a focal area of ground-glass opacity in the periphery of the anterior segment of the left upper lobe measuring 0.8 x 0.7 cm. There is similar-appearing ground-glass type opacity in the anterior left apex measuring 1.2 x 0.8 cm. Thyroid appears normal. There is no appreciable thoracic adenopathy. There is a sizable hiatal type hernia with most of the stomach above the diaphragm. In the visualized upper abdomen, there is atherosclerotic calcification in the aorta. Gallbladder is absent. Incomplete visualization of the pancreas suggests that there is moderate fatty infiltration in the pancreas. There is contrast in portions of the inferior vena cava and hepatic arteries. There is degenerative change in the thoracic spine with increase in kyphosis. There are no blastic or lytic bone lesions. Review of the MIP images confirms the above findings. IMPRESSION: Positive for acute PE with CT evidence of right heart strain (RV/LV Ratio = 1.5) consistent with at least submassive (intermediate risk) PE. The presence of right heart  strain has been associated with an increased risk of morbidity and mortality. Please activate Code PE by paging [phone]. Sizable hiatal hernia with most of the stomach above the diaphragm. Ground-glass opacities on the left, largest measuring 1.2 x 0.8 cm. Followup noncontrast enhanced CT in 3 months advised to assess for stability. It should be noted that atypical neoplasm could present in this manner. There are foci of coronary artery calcification. There is left ventricular hypertrophy. No demonstrable adenopathy. Critical Value/emergent results were called by telephone at the time of interpretation on 08/15/2015 at 5:19 pm to [facility], [place], who verbally acknowledged these results. Electronically Signed   By: Bretta Bang III M.D.   On:  08/15/2015 17:22    . ------------------------------------------------------------ LV EF: 55%  ------------------------------------------------------------ Indications:   Atrial fibrillation - 427.31.  ------------------------------------------------------------ History:  Risk factors:  Hypercholesterolemia.  ------------------------------------------------------------ Study Conclusions  - Left ventricle: The cavity size was normal. There was mild focal basal hypertrophy of the septum. The estimated ejection fraction was 55%. Wall motion was normal; there were no regional wall motion abnormalities. Features are consistent with a pseudonormal left ventricular filling pattern, with concomitant abnormal relaxation and increased filling pressure (grade 2 diastolic dysfunction). - Mitral valve: Calcified annulus. Mildly thickened leaflets . - Right ventricle: The cavity size was mildly dilated. - Right atrium: The atrium was mildly dilated. - Atrial septum: No defect or patent foramen ovale was identified. - Pulmonary arteries: PA peak pressure: 52mm Hg (S). Impressions:  - The right ventricular systolic pressure was  increased consistent with moderate pulmonary hypertension.   EKG: Independently reviewed Vent. rate 106 BPM PR interval * ms QRS duration 89 ms QT/QTc 367/487 ms P-R-T axes -1 -19 38 Atrial fibrillation Borderline left axis deviation Borderline prolonged QT interval Baseline wander in lead(s) II aVR  Assessment/Plan Principal Problem:   Pulmonary embolism (HCC) Admit to a stepdown. Continue supplemental oxygen. Continue heparin infusion and bridge to oral anticoagulant during the hospitalization. Check echocardiogram. Critical care/pulmonology will evaluate the patient.  Active Problems:   Atrial fibrillation (HCC) Continue cardiac telemetry. Continue anticoagulation. Continue amiodarone for rate control.    Adult hypothyroidism   Hypothyroid Continue levothyroxine 75 g by mouth daily Check TSH periodically.    Elevated troponin level   Elevated brain natriuretic peptide (BNP) level Continue levels trending. Check echocardiogram.  Critical care will be evaluating the patient.  Code Status: Full code. DVT Prophylaxis: On full dose heparin. Family Communication: Her daughter was present in the room. Disposition Plan: Admit to a stepdown to continue heparin infusion.  Time spent: 70 minutes were spent during the process of this admission.  Bobette Mo [facility] Pager [phone].

## 2015-08-15 NOTE — ED Notes (Signed)
Per EMS pt c/o shortness of breath, couldn't make appt with PCP so went to urgent care- which did not have ability to do EKG or Chest X-ray. Patients SpO2 sats were 80%/RA at [facility]. Patient lungs sounds clear- diminished at bases. Patient placed on 2L O2 [place]. Pt has hx of afib- in a.fib currently with rate 100-115bpm.

## 2015-08-16 ENCOUNTER — Inpatient Hospital Stay (HOSPITAL_COMMUNITY): Payer: Medicare Other

## 2015-08-16 DIAGNOSIS — L8990 Pressure ulcer of unspecified site, unspecified stage: Secondary | ICD-10-CM | POA: Insufficient documentation

## 2015-08-16 DIAGNOSIS — I2699 Other pulmonary embolism without acute cor pulmonale: Secondary | ICD-10-CM

## 2015-08-16 DIAGNOSIS — I2609 Other pulmonary embolism with acute cor pulmonale: Secondary | ICD-10-CM | POA: Insufficient documentation

## 2015-08-16 DIAGNOSIS — I480 Paroxysmal atrial fibrillation: Secondary | ICD-10-CM

## 2015-08-16 DIAGNOSIS — R7989 Other specified abnormal findings of blood chemistry: Secondary | ICD-10-CM

## 2015-08-16 DIAGNOSIS — I2602 Saddle embolus of pulmonary artery with acute cor pulmonale: Secondary | ICD-10-CM

## 2015-08-16 DIAGNOSIS — I2601 Septic pulmonary embolism with acute cor pulmonale: Secondary | ICD-10-CM

## 2015-08-16 DIAGNOSIS — E038 Other specified hypothyroidism: Secondary | ICD-10-CM

## 2015-08-16 LAB — CBC WITH DIFFERENTIAL/PLATELET
BASOS ABS: 0.1 10*3/uL (ref 0.0–0.1)
Basophils Relative: 1 %
Eosinophils Absolute: 0.1 10*3/uL (ref 0.0–0.7)
Eosinophils Relative: 1 %
HEMATOCRIT: 37.4 % (ref 36.0–46.0)
Hemoglobin: 11.9 g/dL — ABNORMAL LOW (ref 12.0–15.0)
LYMPHS ABS: 2 10*3/uL (ref 0.7–4.0)
LYMPHS PCT: 19 %
MCH: 27.7 pg (ref 26.0–34.0)
MCHC: 31.8 g/dL (ref 30.0–36.0)
MCV: 87.2 fL (ref 78.0–100.0)
MONO ABS: 1.4 10*3/uL — AB (ref 0.1–1.0)
Monocytes Relative: 13 %
NEUTROS ABS: 7.2 10*3/uL (ref 1.7–7.7)
Neutrophils Relative %: 67 %
Platelets: 170 10*3/uL (ref 150–400)
RBC: 4.29 MIL/uL (ref 3.87–5.11)
RDW: 18.5 % — AB (ref 11.5–15.5)
WBC: 10.6 10*3/uL — ABNORMAL HIGH (ref 4.0–10.5)

## 2015-08-16 LAB — GLUCOSE, CAPILLARY: Glucose-Capillary: 91 mg/dL (ref 65–99)

## 2015-08-16 LAB — BASIC METABOLIC PANEL
ANION GAP: 10 (ref 5–15)
BUN: 11 mg/dL (ref 6–20)
CO2: 26 mmol/L (ref 22–32)
Calcium: 8.3 mg/dL — ABNORMAL LOW (ref 8.9–10.3)
Chloride: 107 mmol/L (ref 101–111)
Creatinine, Ser: 1.24 mg/dL — ABNORMAL HIGH (ref 0.44–1.00)
GFR, EST AFRICAN AMERICAN: 43 mL/min — AB (ref >60)
GFR, EST NON AFRICAN AMERICAN: 37 mL/min — AB (ref >60)
Glucose, Bld: 146 mg/dL — ABNORMAL HIGH (ref 65–99)
POTASSIUM: 3.5 mmol/L (ref 3.5–5.1)
SODIUM: 143 mmol/L (ref 135–145)

## 2015-08-16 LAB — COMPREHENSIVE METABOLIC PANEL
ALBUMIN: 2.5 g/dL — AB (ref 3.5–5.0)
ALT: 9 U/L — ABNORMAL LOW (ref 14–54)
ANION GAP: 13 (ref 5–15)
AST: 19 U/L (ref 15–41)
Alkaline Phosphatase: 59 U/L (ref 38–126)
BILIRUBIN TOTAL: 0.3 mg/dL (ref 0.3–1.2)
BUN: 12 mg/dL (ref 6–20)
CHLORIDE: 105 mmol/L (ref 101–111)
CO2: 26 mmol/L (ref 22–32)
Calcium: 8.3 mg/dL — ABNORMAL LOW (ref 8.9–10.3)
Creatinine, Ser: 1.07 mg/dL — ABNORMAL HIGH (ref 0.44–1.00)
GFR calc Af Amer: 51 mL/min — ABNORMAL LOW (ref >60)
GFR calc non Af Amer: 44 mL/min — ABNORMAL LOW (ref >60)
GLUCOSE: 115 mg/dL — AB (ref 65–99)
POTASSIUM: 3.8 mmol/L (ref 3.5–5.1)
SODIUM: 144 mmol/L (ref 135–145)
TOTAL PROTEIN: 5.7 g/dL — AB (ref 6.5–8.1)

## 2015-08-16 LAB — MAGNESIUM: Magnesium: 1.7 mg/dL (ref 1.7–2.4)

## 2015-08-16 LAB — HEPARIN LEVEL (UNFRACTIONATED)
Heparin Unfractionated: 0.57 IU/mL (ref 0.30–0.70)
Heparin Unfractionated: 0.73 IU/mL — ABNORMAL HIGH (ref 0.30–0.70)

## 2015-08-16 LAB — MRSA PCR SCREENING: MRSA BY PCR: NEGATIVE

## 2015-08-16 LAB — TROPONIN I
Troponin I: 0.82 ng/mL (ref <0.031)
Troponin I: 0.62 ng/mL (ref <0.031)

## 2015-08-16 LAB — PHOSPHORUS: PHOSPHORUS: 3 mg/dL (ref 2.5–4.6)

## 2015-08-16 MED ORDER — LIDOCAINE HCL 1 % IJ SOLN
INTRAMUSCULAR | Status: AC
  Filled 2015-08-16: qty 20

## 2015-08-16 MED ORDER — MIDAZOLAM HCL 2 MG/2ML IJ SOLN
INTRAMUSCULAR | Status: AC
  Filled 2015-08-16: qty 2

## 2015-08-16 MED ORDER — HEPARIN (PORCINE) IN NACL 100-0.45 UNIT/ML-% IJ SOLN: 1100.0000 [IU]/h | INTRAMUSCULAR | Status: DC

## 2015-08-16 MED ORDER — FENTANYL CITRATE (PF) 100 MCG/2ML IJ SOLN
INTRAMUSCULAR | Status: AC
  Filled 2015-08-16: qty 2

## 2015-08-16 MED ORDER — LORAZEPAM 2 MG/ML IJ SOLN
INTRAMUSCULAR | Status: AC | PRN
  Administered 2015-08-16: 0.5 mg via INTRAVENOUS

## 2015-08-16 MED ORDER — FENTANYL CITRATE (PF) 100 MCG/2ML IJ SOLN
INTRAMUSCULAR | Status: AC | PRN
  Administered 2015-08-16 (×2): 25 ug via INTRAVENOUS

## 2015-08-16 MED ORDER — MIDAZOLAM HCL 2 MG/2ML IJ SOLN
INTRAMUSCULAR | Status: AC | PRN
  Administered 2015-08-16: 0.5 mg via INTRAVENOUS

## 2015-08-16 MED ORDER — HEPARIN (PORCINE) IN NACL 100-0.45 UNIT/ML-% IJ SOLN
1000.0000 [IU]/h | INTRAMUSCULAR | Status: DC
  Administered 2015-08-17 – 2015-08-21 (×5): 1100 [IU]/h via INTRAVENOUS
  Administered 2015-08-22: 1000 [IU]/h via INTRAVENOUS
  Filled 2015-08-16 (×6): qty 250

## 2015-08-16 NOTE — Progress Notes (Signed)
ANTICOAGULATION CONSULT NOTE - Follow Up Consult  Pharmacy Consult for heparin Indication: pulmonary embolus  Labs:  Recent Labs  08/15/15 1545 08/15/15 1554 08/16/15 0007 08/16/15 0214 08/16/15 0526 08/16/15 1206  HGB 13.6 16.3*  --   --  11.9*  --   HCT 42.8 48.0*  --   --  37.4  --   PLT 204  --   --   --  170  --   HEPARINUNFRC  --   --   --  0.57  --  0.73*  CREATININE  --  1.10* 1.24*  --  1.07*  --   TROPONINI  --   --  0.82*  --  0.62*  --      Assessment/Plan:  82yo female admitted 08/15/2015  With submassive PE. Pharmacy consulted to dose IV heparin for treatment.   S/P IVC Placement to resume heparin at 0000  Plan:  Resume 1100 units/hr @ 0000 8hr HL @ 0800 Daily HL/CBC Monitor for s/s of bleeding

## 2015-08-16 NOTE — Progress Notes (Addendum)
Patient Demographics:    Carla [name], is a 82 y.o. female, DOB - 1922-01-17, ZOX:[id number]  Admit date - 08/15/2015   Admitting Physician Bobette Mo, MD  Outpatient Primary MD for the patient is [name], MD  LOS - 1   Chief Complaint  Patient presents with  . Shortness of Breath        Subjective:    Carla [name] today has, No headache, No chest pain, No abdominal pain - No Nausea, No new weakness tingling or numbness, No Cough - SOB.     Assessment  & Plan :     1. Acute submassive PE with mild right heart strain. Likely source right lower extremity which is swollen, Stable in stepdown, currently completely symptom free, is needing 2 L nasal cannula oxygen, continue heparin drip, evaluate echogram and lower extremity venous duplex. We'll transition to oral Eliquis in the morning.   Addendum - large DVT in the R Leg, D/W PCCM and IR, IVC filter now.   2. Chronic atrial fibrillation [place] vasc 2 score of 4 or above. Continue amiodarone for rate control, continue heparin and transitioned to Eliquis later on.  3. Right lower extremity swelling. Check venous duplex.  4. Hypothyroidism. Continue home dose Synthroid.  5. Mildly elevated troponin. No chest pain currently, EKG nonacute, troponin rise is in non-ACS pattern likely demand ischemia from PE, supportive care. Check echogram to evaluate wall motion and EF.    Code Status : Full  Family Communication  : none  Disposition Plan  : Stay in stepdown for today  Consults  :  PCCM, IR  Procedures  :   CTA chest - submassive PE with mild right heart strain   Lower extremity venous duplex - Large R leg DVT  TTE    DVT Prophylaxis  :  Heparin    Lab Results  Component Value Date   PLT 170 08/16/2015     Inpatient Medications  Scheduled Meds: . acetaminophen  650 mg Oral BID  . amiodarone  100 mg Oral Daily  . calcium-vitamin D  1 tablet Oral BID  . docusate sodium  100 mg Oral BID  . levothyroxine  75 mcg Oral QAC breakfast  . nitrofurantoin  50 mg Oral Daily  . pantoprazole  40 mg Oral Daily   Continuous Infusions: . heparin 1,200 Units/hr (08/16/15 0800)   PRN Meds:.ondansetron **OR** ondansetron (ZOFRAN) IV  Antibiotics  :     Anti-infectives    None        Objective:   Filed Vitals:   08/15/15 2345 08/16/15 0350 08/16/15 0805 08/16/15 0808  BP: 108/68 154/99 156/92 156/92  Pulse: 74 101 148 99  Temp: 97.3 F (36.3 C) 98 F (36.7 C)  98 F (36.7 C)  TempSrc: Oral Oral  Oral  Resp: Height:  5' 2.5" (1.588 m)    Weight:  76.5 kg (168 lb 10.4 oz)    SpO2: 98% 92% 92% 96%    Wt Readings from Last 3 Encounters:  08/16/15 76.5 kg (168 lb 10.4 oz)  02/27/15 77.066 kg (169 lb 14.4 oz)  08/07/14 75.978 kg (167 lb 8 oz)     Intake/Output Summary (Last 24 hours) at  08/16/15 0925 Last data filed at 08/16/15 1610  Gross per 24 hour  Intake    348 ml  Output      0 ml  Net    348 ml     Physical Exam  Awake Alert, Oriented X 3, No new F.N deficits, Normal affect [place].AT,PERRAL Supple Neck,No JVD, No cervical lymphadenopathy appriciated.  Symmetrical Chest wall movement, Good air movement bilaterally, CTAB RRR,No Gallops,Rubs or new Murmurs, No Parasternal Heave +ve B.Sounds, Abd Soft, No tenderness, No organomegaly appriciated, No rebound - guarding or rigidity. No Cyanosis, Clubbing or edema, No new Rash or bruise, R leg swollen    Data Review:   Micro Results Recent Results (from the past 240 hour(s))  MRSA PCR Screening     Status: None   Collection Time: 08/15/15 11:58 PM  Result Value Ref Range Status   MRSA by PCR NEGATIVE NEGATIVE Final    Comment:        The GeneXpert MRSA Assay (FDA approved for NASAL specimens only), is  one component of a comprehensive MRSA colonization surveillance program. It is not intended to diagnose MRSA infection nor to guide or monitor treatment for MRSA infections.     Radiology Reports Dg Chest 2 View  08/15/2015  CLINICAL DATA:  Acute shortness of breath since waking up this morning. EXAM: CHEST  2 VIEW COMPARISON:  01/08/2013 FINDINGS: The cardiomediastinal silhouette is unchanged, with a large hiatal hernia again seen. There remains elevation of the right hemidiaphragm. Chronic diffuse interstitial coarsening and more focal scarring or atelectasis in the left lung base do not appear significantly changed. There is no evidence of acute airspace consolidation, overt edema, pleural effusion, or pneumothorax. Diffuse aortic calcification is noted. No acute osseous abnormality is seen. IMPRESSION: Stable appearance of the chest without evidence of active cardiopulmonary disease. Electronically Signed   By: Sebastian Ache M.D.   On: 08/15/2015 15:33   Ct Angio Chest Pe W/cm &/or Wo Cm  08/15/2015  CLINICAL DATA:  Shortness of breath and chest pain for 1 week EXAM: CT ANGIOGRAPHY CHEST WITH CONTRAST TECHNIQUE: Multidetector CT imaging of the chest was performed using the standard protocol during bolus administration of intravenous contrast. Multiplanar CT image reconstructions and MIPs were obtained to evaluate the vascular anatomy. CONTRAST:  OMNIPAQUE IOHEXOL 350 MG/ML SOLN COMPARISON:  Chest CT August 21, 2012; chest radiograph August 15, 2015 FINDINGS: There is extensive pulmonary embolism with pulmonary emboli arising from each main pulmonary artery and extending into upper and proximal lower lobe pulmonary arteries bilaterally. There is more pulmonary embolus on the right than on the left with extensive pulmonary embolus extending into several right lower lobe pulmonary branches as well as into the right middle and right upper lobe pulmonary branches. There is right heart strain  with the right ventricle to left ventricle diameter ratio of 1.5, normal less than 0.9. There is no thoracic aortic aneurysm or dissection. There is atherosclerotic change throughout much of the aorta. The visualized great vessels appear normal except for moderate atherosclerotic calcification at the origin of the left subclavian artery. There is left ventricular hypertrophy. The pericardium is not thickened. There are foci of coronary artery calcification. There is a small calcified granuloma in the lateral left base. There is a focal area of ground-glass opacity in the periphery of the anterior segment of the left upper lobe measuring 0.8 x 0.7 cm. There is similar-appearing ground-glass type opacity in the anterior left apex measuring 1.2 x 0.8 cm.  Thyroid appears normal. There is no appreciable thoracic adenopathy. There is a sizable hiatal type hernia with most of the stomach above the diaphragm. In the visualized upper abdomen, there is atherosclerotic calcification in the aorta. Gallbladder is absent. Incomplete visualization of the pancreas suggests that there is moderate fatty infiltration in the pancreas. There is contrast in portions of the inferior vena cava and hepatic arteries. There is degenerative change in the thoracic spine with increase in kyphosis. There are no blastic or lytic bone lesions. Review of the MIP images confirms the above findings. IMPRESSION: Positive for acute PE with CT evidence of right heart strain (RV/LV Ratio = 1.5) consistent with at least submassive (intermediate risk) PE. The presence of right heart strain has been associated with an increased risk of morbidity and mortality. Please activate Code PE by paging [phone]. Sizable hiatal hernia with most of the stomach above the diaphragm. Ground-glass opacities on the left, largest measuring 1.2 x 0.8 cm. Followup noncontrast enhanced CT in 3 months advised to assess for stability. It should be noted that atypical neoplasm  could present in this manner. There are foci of coronary artery calcification. There is left ventricular hypertrophy. No demonstrable adenopathy. Critical Value/emergent results were called by telephone at the time of interpretation on 08/15/2015 at 5:19 pm to [facility], [place], who verbally acknowledged these results. Electronically Signed   By: Bretta Bang III M.D.   On: 08/15/2015 17:22     CBC  Recent Labs Lab 08/15/15 1545 08/15/15 1554 08/16/15 0526  WBC 10.8*  --  10.6*  HGB 13.6 16.3* 11.9*  HCT 42.8 48.0* 37.4  PLT 204  --  170  MCV 87.7  --  87.2  MCH 27.9  --  27.7  MCHC 31.8  --  31.8  RDW 18.3*  --  18.5*  LYMPHSABS 0.9  --  2.0  MONOABS 1.2*  --  1.4*  EOSABS 0.0  --  0.1  BASOSABS 0.0  --  0.1    Chemistries   Recent Labs Lab 08/15/15 1554 08/16/15 0007 08/16/15 0526  NA 142 143 144  K 3.9 3.5 3.8  CL 103 107 105  CO2  --  26 26  GLUCOSE 128* 146* 115*  BUN 19 11 12   CREATININE 1.10* 1.24* 1.07*  CALCIUM  --  8.3* 8.3*  MG  --  1.7  --   AST  --   --  19  ALT  --   --  9*  ALKPHOS  --   --  59  BILITOT  --   --  0.3   ------------------------------------------------------------------------------------------------------------------ No results for input(s): CHOL, HDL, LDLCALC, TRIG, CHOLHDL, LDLDIRECT in the last 72 hours.  Lab Results  Component Value Date   HGBA1C 6.1* 01/08/2013   ------------------------------------------------------------------------------------------------------------------ No results for input(s): TSH, T4TOTAL, T3FREE, THYROIDAB in the last 72 hours.  Invalid input(s): FREET3 ------------------------------------------------------------------------------------------------------------------ No results for input(s): VITAMINB12, FOLATE, FERRITIN, TIBC, IRON, RETICCTPCT in the last 72 hours.  Coagulation profile No results for input(s): INR, PROTIME in the last 168 hours.  No results for input(s): DDIMER in the last 72  hours.  Cardiac Enzymes  Recent Labs Lab 08/16/15 0007 08/16/15 0526  TROPONINI 0.82* 0.62*   ------------------------------------------------------------------------------------------------------------------    Component Value Date/Time   BNP 295.4* 08/15/2015 1545    Time Spent in minutes   35   Amor Packard K M.D on 08/16/2015 at 9:25 AM  Between 7am to 7pm - Pager - [phone]  After 7pm  go to [email or web address] - password [facility]  [facility] -  Office  [phone]

## 2015-08-16 NOTE — Progress Notes (Signed)
Pt arrived to unit in adult diaper soiled with urine and stool. Redness noted in groin and stage 1 pressure ulcer on sacrum. Peri care provided and foam dressing in now in place over sacrum. Will continue to monitor.  Stanford Breed, RN 08/16/2015

## 2015-08-16 NOTE — Progress Notes (Signed)
*  PRELIMINARY RESULTS* Vascular Ultrasound Lower extremity venous duplex has been completed.  Preliminary findings: Mobile thrombus noted in the right common femoral vein. Occlusive thrombus noted in the right popliteal vein and right gastroc veins. No DVT in visualized veins of LLE.  Farrel Demark, RDMS, RVT  .08/16/2015, 2:23 PM

## 2015-08-16 NOTE — Procedures (Signed)
Successful placement of an infrarenal IVC filter.  EBL: None No immediate complications.   Jay Percy Winterrowd, MD Pager #: [phone]    

## 2015-08-16 NOTE — Progress Notes (Signed)
ANTICOAGULATION CONSULT NOTE - Follow Up Consult  Pharmacy Consult for heparin Indication: pulmonary embolus   Labs:  Recent Labs  08/15/15 1545 08/15/15 1554 08/16/15 0007 08/16/15 0214 08/16/15 0526 08/16/15 1206  HGB 13.6 16.3*  --   --  11.9*  --   HCT 42.8 48.0*  --   --  37.4  --   PLT 204  --   --   --  170  --   HEPARINUNFRC  --   --   --  0.57  --  0.73*  CREATININE  --  1.10* 1.24*  --  1.07*  --   TROPONINI  --   --  0.82*  --  0.62*  --      Assessment/Plan:  82yo female admitted 08/15/2015  With submassive PE. Pharmacy consulted to dose IV heparin for treatment.   Confirmatory heparin level slightly SUPERtherapeutic (0.73) H/H stable, PLT wnl, no bleeding noted   Plan:  Decrease heparin to 1100 units/hr 8hr HL Daily HL/CBC Monitor for s/s of bleeding

## 2015-08-16 NOTE — Sedation Documentation (Signed)
Patient denies pain and is resting comfortably.  

## 2015-08-16 NOTE — Progress Notes (Signed)
  Echocardiogram 2D Echocardiogram has been performed.  Delcie Roch 08/16/2015, 4:14 PM

## 2015-08-16 NOTE — Consult Note (Signed)
Chief Complaint: Patient was seen in consultation today for IVC filter placement  Referring Physician(s): Tyson Alias (CCM)  History of Present Illness: Carla [name] is a 82 y.o. female with complex past medical history significant for atrial fibrillation (unable to tolerate anticoagulation due to bleeding), hyperlipidemia, chronic kidney disease /renal insufficiency and diverticulosis who was admitted to the hospital for the evaluation of chest pain and shortness of breath and was found to have submassive pulmonary embolism on chest CTA performed 08/15/2015. Given patient's multiple medical comorbidities, she was deemed not appropriate for catheter directed pulmonary arterial thrombolysis.     Subsequent bilateral lower screening venous Doppler ultrasound confirmed the presence of multiple thrombus within the right common femoral vein with occlusive thrombus within the right popliteal and gastroc veins. As such, request made for placement of a IVC filter for caval interruption purposes.  The patient reports her chest pain service of breath have improved since admission to the hospital and with the initiation initiation of intravenous heparin. Patient states she has had right lower extremity pain for at least the past month however exhibited this pain to osteoarthritis. Patient is otherwise without complaint.  The patient's son and daughter are in her room however she serves as her own historian and signs her own consents.  Past Medical History  Diagnosis Date  . Iron deficiency anemia   . High cholesterol   . Osteoarthritis   . Female bladder prolapse   . Osteoporosis   . Cataracts, bilateral   . Gallstones   . Hemorrhoids   . Diverticulosis   . Hiatal hernia   . Atrial fibrillation (HCC)   . Cystocele   . Rectocele   . Scoliosis   . Lordosis   . Hyperlipidemia   . Osteoarthritis   . Dysrhythmia     A- fib  . Pneumonia     hx aspiration pneumonia  . Hypothyroidism   .  Chronic kidney disease     chronic kidney disease  . Encounter for fitting and adjustment of pessary 04/23/2002  . Renal insufficiency     Past Surgical History  Procedure Laterality Date  . Cholecystectomy    . Cataract extraction    . Esophagogastroduodenoscopy    . Colonoscopy    . Endometrial biopsy  09/2009  . Esophagogastroduodenoscopy N/A 01/09/2013    Procedure: ESOPHAGOGASTRODUODENOSCOPY (EGD);  Surgeon: Vertell Novak., MD;  Location: [facility];  Service: Endoscopy;  Laterality: N/A;  . Rectal ultrasound N/A 04/10/2014    Procedure: RECTAL ULTRASOUND;  Surgeon: Romie Levee, MD;  Location: [facility];  Service: Endoscopy;  Laterality: N/A;    Allergies: Bactrim and Nsaids  Medications: Prior to Admission medications   Medication Sig Start Date End Date Taking? Authorizing Provider  acetaminophen (TYLENOL) 650 MG CR tablet Take 650 mg by mouth 2 (two) times daily.   Yes Historical Provider, MD  amiodarone (PACERONE) 200 MG tablet TAKE 1/2 TABLET BY MOUTH DAILY Patient taking differently: Take 100 mg by mouth daily. 08/13/15  Yes Peter M [place], MD  Calcium Carb-Cholecalciferol (CALCIUM-VITAMIN D) 600-400 MG-UNIT TABS Take 1 tablet by mouth 2 (two) times daily.    Yes Historical Provider, MD  docusate sodium (COLACE) 100 MG capsule Take 100 mg by mouth 2 (two) times daily.    Yes Historical Provider, MD  levothyroxine (SYNTHROID, LEVOTHROID) 75 MCG tablet Take 75 mcg by mouth daily before breakfast.   Yes Historical Provider, MD  nitrofurantoin (MACRODANTIN) 50 MG capsule Take 50 mg by  mouth. 12/27/14  Yes Historical Provider, MD  pantoprazole (PROTONIX) 40 MG tablet Take 40 mg by mouth daily. 01/10/13  Yes Dorothea Ogle, MD     Family History  Problem Relation Age of Onset  . Breast cancer Paternal Aunt   . Breast cancer Paternal Aunt   . Cancer      several family members on dad's side of family  . Diabetes Father   . Leukemia Father   . Heart failure Brother      Pt cannot confirm this history    Social History   Social History  . Marital Status: Widowed    Spouse Name: N/A  . Number of Children: 4  . Years of Education: N/A   Occupational History  . Retired Chartered loss adjuster    Social History Main Topics  . Smoking status: Never Smoker   . Smokeless tobacco: Never Used  . Alcohol Use: No  . Drug Use: No  . Sexual Activity: No   Other Topics Concern  . None   Social History Narrative   Widowed.  Lives alone.  Uses a cane/walker to ambulate but independent of ADLs.    ECOG Status: 2 - Symptomatic, <50% confined to bed  Review of Systems: A 12 point ROS discussed and pertinent positives are indicated in the HPI above.  All other systems are negative.  Review of Systems  Vital Signs: BP 145/66 mmHg  Pulse 86  Temp(Src) 97.6 F (36.4 C) (Oral)  Resp 23  Ht 5' 2.5" (1.588 m)  Wt 168 lb 10.4 oz (76.5 kg)  BMI 30.34 kg/m2  SpO2 94%  Physical Exam  Mallampati Score:     Imaging: Dg Chest 2 View  08/15/2015  CLINICAL DATA:  Acute shortness of breath since waking up this morning. EXAM: CHEST  2 VIEW COMPARISON:  01/08/2013 FINDINGS: The cardiomediastinal silhouette is unchanged, with a large hiatal hernia again seen. There remains elevation of the right hemidiaphragm. Chronic diffuse interstitial coarsening and more focal scarring or atelectasis in the left lung base do not appear significantly changed. There is no evidence of acute airspace consolidation, overt edema, pleural effusion, or pneumothorax. Diffuse aortic calcification is noted. No acute osseous abnormality is seen. IMPRESSION: Stable appearance of the chest without evidence of active cardiopulmonary disease. Electronically Signed   By: Sebastian Ache M.D.   On: 08/15/2015 15:33   Ct Angio Chest Pe W/cm &/or Wo Cm  08/15/2015  CLINICAL DATA:  Shortness of breath and chest pain for 1 week EXAM: CT ANGIOGRAPHY CHEST WITH CONTRAST TECHNIQUE: Multidetector CT imaging of the  chest was performed using the standard protocol during bolus administration of intravenous contrast. Multiplanar CT image reconstructions and MIPs were obtained to evaluate the vascular anatomy. CONTRAST:  OMNIPAQUE IOHEXOL 350 MG/ML SOLN COMPARISON:  Chest CT August 21, 2012; chest radiograph August 15, 2015 FINDINGS: There is extensive pulmonary embolism with pulmonary emboli arising from each main pulmonary artery and extending into upper and proximal lower lobe pulmonary arteries bilaterally. There is more pulmonary embolus on the right than on the left with extensive pulmonary embolus extending into several right lower lobe pulmonary branches as well as into the right middle and right upper lobe pulmonary branches. There is right heart strain with the right ventricle to left ventricle diameter ratio of 1.5, normal less than 0.9. There is no thoracic aortic aneurysm or dissection. There is atherosclerotic change throughout much of the aorta. The visualized great vessels appear normal except for moderate atherosclerotic  calcification at the origin of the left subclavian artery. There is left ventricular hypertrophy. The pericardium is not thickened. There are foci of coronary artery calcification. There is a small calcified granuloma in the lateral left base. There is a focal area of ground-glass opacity in the periphery of the anterior segment of the left upper lobe measuring 0.8 x 0.7 cm. There is similar-appearing ground-glass type opacity in the anterior left apex measuring 1.2 x 0.8 cm. Thyroid appears normal. There is no appreciable thoracic adenopathy. There is a sizable hiatal type hernia with most of the stomach above the diaphragm. In the visualized upper abdomen, there is atherosclerotic calcification in the aorta. Gallbladder is absent. Incomplete visualization of the pancreas suggests that there is moderate fatty infiltration in the pancreas. There is contrast in portions of the inferior  vena cava and hepatic arteries. There is degenerative change in the thoracic spine with increase in kyphosis. There are no blastic or lytic bone lesions. Review of the MIP images confirms the above findings. IMPRESSION: Positive for acute PE with CT evidence of right heart strain (RV/LV Ratio = 1.5) consistent with at least submassive (intermediate risk) PE. The presence of right heart strain has been associated with an increased risk of morbidity and mortality. Please activate Code PE by paging [phone]. Sizable hiatal hernia with most of the stomach above the diaphragm. Ground-glass opacities on the left, largest measuring 1.2 x 0.8 cm. Followup noncontrast enhanced CT in 3 months advised to assess for stability. It should be noted that atypical neoplasm could present in this manner. There are foci of coronary artery calcification. There is left ventricular hypertrophy. No demonstrable adenopathy. Critical Value/emergent results were called by telephone at the time of interpretation on 08/15/2015 at 5:19 pm to [facility], who verbally acknowledged these results. Electronically Signed   By: Bretta Bang III M.D.   On: 08/15/2015 17:22    Labs:  CBC:  Recent Labs  08/15/15 1545 08/15/15 1554 08/16/15 0526  WBC 10.8*  --  10.6*  HGB 13.6 16.3* 11.9*  HCT 42.8 48.0* 37.4  PLT 204  --  170    COAGS: No results for input(s): INR, APTT in the last 8760 hours.  BMP:  Recent Labs  08/15/15 1554 08/16/15 0007 08/16/15 0526  NA 142 143 144  K 3.9 3.5 3.8  CL 103 107 105  CO2  --  26 26  GLUCOSE 128* 146* 115*  BUN 19 11 12   CALCIUM  --  8.3* 8.3*  CREATININE 1.10* 1.24* 1.07*  GFRNONAA  --  37* 44*  GFRAA  --  43* 51*    LIVER FUNCTION TESTS:  Recent Labs  08/16/15 0526  BILITOT 0.3  AST 19  ALT 9*  ALKPHOS 59  PROT 5.7*  ALBUMIN 2.5*    TUMOR MARKERS: No results for input(s): AFPTM, CEA, CA199, CHROMGRNA in the last 8760 hours.  Assessment and  Plan:  Carla [name] is a 82 y.o. female with complex past medical history significant for atrial fibrillation (unable to tolerate anticoagulation due to bleeding), hyperlipidemia, chronic kidney disease /renal insufficiency and diverticulosis who was admitted to the hospital for the evaluation of chest pain and shortness of breath and was found to have submassive pulmonary embolism on chest CTA performed 08/15/2015. Given patient's multiple medical comorbidities, she was deemed not appropriate for catheter directed pulmonary arterial thrombolysis.     Subsequent bilateral lower screening venous Doppler ultrasound confirmed the presence of multiple thrombus within the  right common femoral vein with occlusive thrombus within the right popliteal and gastroc veins. As such, request made for placement of a IVC filter for caval interruption purposes.  Risks and Benefits discussed with the patient including, but not limited to bleeding, infection, contrast induced renal failure, filter fracture or migration which can lead to emergency surgery or even death, strut penetration with damage or irritation to adjacent structures and caval thrombosis.  Given the patient's advanced age and likely inability to tolerate anticoagulation, this IVC filter will be considered a permanent device.  Given the patient's chronic renal insufficiency, I will perform the IVC filter placement with CO2 is a contrast agent.  All of the patient's as well as the patient's daughter and sons questions were answered, patient is agreeable to proceed.  Consent signed and in chart.   Thank you for this interesting consult.  I greatly enjoyed meeting Carla [name] and look forward to participating in her care.  A copy of this report was sent to the requesting provider on this date.  Electronically Signed: Simonne Come 08/16/2015, 4:01 PM   I spent a total of 20 Minutes in face to face in clinical consultation, greater than 50% of  which was counseling/coordinating care for IVC filter placement

## 2015-08-16 NOTE — Progress Notes (Signed)
ANTICOAGULATION CONSULT NOTE - Follow Up Consult  Pharmacy Consult for heparin Indication: pulmonary embolus   Labs:  Recent Labs  08/15/15 1545 08/15/15 1554 08/16/15 0007 08/16/15 0214  HGB 13.6 16.3*  --   --   HCT 42.8 48.0*  --   --   PLT 204  --   --   --   HEPARINUNFRC  --   --   --  0.57  CREATININE  --  1.10* 1.24*  --   TROPONINI  --   --  0.82*  --      Assessment/Plan:  82yo female therapeutic on heparin with initial dosing for PE. Will continue gtt at current rate and confirm stable with additional level.   Vernard Gambles, PharmD, BCPS  08/16/2015,2:57 AM

## 2015-08-16 NOTE — Consult Note (Signed)
Name: Carla [name] MRN: [id number] DOB: 23-Oct-1921    ADMISSION DATE:  08/15/2015 CONSULTATION DATE:  2/11  REFERRING MD :  [facility]  CHIEF COMPLAINT:  Acute sob  BRIEF PATIENT DESCRIPTION: WNWDWF  SIGNIFICANT EVENTS  2/10 PE  STUDIES:  Ct as noted LEDS>>   HISTORY OF PRESENT ILLNESS:  82 yo WF with a plethora of health issues but is ambulatory at baseline without SOB who developed sudden SOB, mild substernal chest pain 2/10 and when presented to hospital was found to have submassive PE with rt heart strain on CT scan. She notes rt leg pain also. Denies any lower ext trauma but reports leg pain. She has a hx of Afib but is not on anticoagulation due to hx of GIB. PCCM asked to evaluate. She is not in acute distress is coherent and reports she has improved since admission.    PAST MEDICAL HISTORY :   has a past medical history of Iron deficiency anemia; High cholesterol; Osteoarthritis; Female bladder prolapse; Osteoporosis; Cataracts, bilateral; Gallstones; Hemorrhoids; Diverticulosis; Hiatal hernia; Atrial fibrillation (HCC); Cystocele; Rectocele; Scoliosis; Lordosis; Hyperlipidemia; Osteoarthritis; Dysrhythmia; Pneumonia; Hypothyroidism; Chronic kidney disease; Encounter for fitting and adjustment of pessary (04/23/2002); and Renal insufficiency.  has past surgical history that includes Cholecystectomy; Cataract extraction; Esophagogastroduodenoscopy; Colonoscopy; Endometrial biopsy (09/2009); Esophagogastroduodenoscopy (N/A, 01/09/2013); and Rectal ultrasound (N/A, 04/10/2014). Prior to Admission medications   Medication Sig Start Date End Date Taking? Authorizing Provider  acetaminophen (TYLENOL) 650 MG CR tablet Take 650 mg by mouth 2 (two) times daily.   Yes Historical Provider, MD  amiodarone (PACERONE) 200 MG tablet TAKE 1/2 TABLET BY MOUTH DAILY Patient taking differently: Take 100 mg by mouth daily. 08/13/15  Yes Peter M [place], MD  Calcium Carb-Cholecalciferol  (CALCIUM-VITAMIN D) 600-400 MG-UNIT TABS Take 1 tablet by mouth 2 (two) times daily.    Yes Historical Provider, MD  docusate sodium (COLACE) 100 MG capsule Take 100 mg by mouth 2 (two) times daily.    Yes Historical Provider, MD  levothyroxine (SYNTHROID, LEVOTHROID) 75 MCG tablet Take 75 mcg by mouth daily before breakfast.   Yes Historical Provider, MD  nitrofurantoin (MACRODANTIN) 50 MG capsule Take 50 mg by mouth. 12/27/14  Yes Historical Provider, MD  pantoprazole (PROTONIX) 40 MG tablet Take 40 mg by mouth daily. 01/10/13  Yes Dorothea Ogle, MD   Allergies  Allergen Reactions  . Bactrim [Sulfamethoxazole-Trimethoprim] Other (See Comments)    Has chronic kidney disease.  This is not recommended by nephrologist.    . Nsaids Other (See Comments)    Has chronic kidney disease.  This is not recommended by nephrologist.    FAMILY HISTORY:  family history includes Breast cancer in her paternal aunt and paternal aunt; Diabetes in her father; Heart failure in her brother; Leukemia in her father. SOCIAL HISTORY:  reports that she has never smoked. She has never used smokeless tobacco. She reports that she does not drink alcohol or use illicit drugs.  REVIEW OF SYSTEMS:   10 point review of system taken, please see HPI for positives and negatives.  SUBJECTIVE: NAD  VITAL SIGNS: Temp:  [97.3 F (36.3 C)-98 F (36.7 C)] 97.6 F (36.4 C) (02/11 1131) Pulse Rate:  [74-148] 86 (02/11 1135) Resp:  [15-30] 23 (02/11 1135) BP: (108-174)/(66-144) 145/66 mmHg (02/11 1135) SpO2:  [92 %-98 %] 94 % (02/11 1135) FiO2 (%):  [0 %] 0 % (02/10 2305) Weight:  [168 lb 10.4 oz (76.5 kg)-175 lb (79.379 kg)] 168 lb 10.4 oz (  76.5 kg) (02/11 0350)  PHYSICAL EXAMINATION: General:  WNWDWF NAD at rest Neuro: Intact HEENT: No jvd/lan Cardiovascular:  HSIR IR Lungs:  CTA Abdomen:  Soft +bs Musculoskeletal:  Intact  Skin: Left lower ext tender   Recent Labs Lab 08/15/15 1554 08/16/15 0007 08/16/15 0526    NA 142 143 144  K 3.9 3.5 3.8  CL 103 107 105  CO2  --  26 26  BUN '19 11 12  '$ CREATININE 1.10* 1.24* 1.07*  GLUCOSE 128* 146* 115*    Recent Labs Lab 08/15/15 1545 08/15/15 1554 08/16/15 0526  HGB 13.6 16.3* 11.9*  HCT 42.8 48.0* 37.4  WBC 10.8*  --  10.6*  PLT 204  --  170   Dg Chest 2 View  08/15/2015  CLINICAL DATA:  Acute shortness of breath since waking up this morning. EXAM: CHEST  2 VIEW COMPARISON:  01/08/2013 FINDINGS: The cardiomediastinal silhouette is unchanged, with a large hiatal hernia again seen. There remains elevation of the right hemidiaphragm. Chronic diffuse interstitial coarsening and more focal scarring or atelectasis in the left lung base do not appear significantly changed. There is no evidence of acute airspace consolidation, overt edema, pleural effusion, or pneumothorax. Diffuse aortic calcification is noted. No acute osseous abnormality is seen. IMPRESSION: Stable appearance of the chest without evidence of active cardiopulmonary disease. Electronically Signed   By: Logan Bores M.D.   On: 08/15/2015 15:33   Ct Angio Chest Pe W/cm &/or Wo Cm  08/15/2015  CLINICAL DATA:  Shortness of breath and chest pain for 1 week EXAM: CT ANGIOGRAPHY CHEST WITH CONTRAST TECHNIQUE: Multidetector CT imaging of the chest was performed using the standard protocol during bolus administration of intravenous contrast. Multiplanar CT image reconstructions and MIPs were obtained to evaluate the vascular anatomy. CONTRAST:  151m OMNIPAQUE IOHEXOL 350 MG/ML SOLN COMPARISON:  Chest CT August 21, 2012; chest radiograph August 15, 2015 FINDINGS: There is extensive pulmonary embolism with pulmonary emboli arising from each main pulmonary artery and extending into upper and proximal lower lobe pulmonary arteries bilaterally. There is more pulmonary embolus on the right than on the left with extensive pulmonary embolus extending into several right lower lobe pulmonary branches as well as  into the right middle and right upper lobe pulmonary branches. There is right heart strain with the right ventricle to left ventricle diameter ratio of 1.5, normal less than 0.9. There is no thoracic aortic aneurysm or dissection. There is atherosclerotic change throughout much of the aorta. The visualized great vessels appear normal except for moderate atherosclerotic calcification at the origin of the left subclavian artery. There is left ventricular hypertrophy. The pericardium is not thickened. There are foci of coronary artery calcification. There is a small calcified granuloma in the lateral left base. There is a focal area of ground-glass opacity in the periphery of the anterior segment of the left upper lobe measuring 0.8 x 0.7 cm. There is similar-appearing ground-glass type opacity in the anterior left apex measuring 1.2 x 0.8 cm. Thyroid appears normal. There is no appreciable thoracic adenopathy. There is a sizable hiatal type hernia with most of the stomach above the diaphragm. In the visualized upper abdomen, there is atherosclerotic calcification in the aorta. Gallbladder is absent. Incomplete visualization of the pancreas suggests that there is moderate fatty infiltration in the pancreas. There is contrast in portions of the inferior vena cava and hepatic arteries. There is degenerative change in the thoracic spine with increase in kyphosis. There are no blastic or  lytic bone lesions. Review of the MIP images confirms the above findings. IMPRESSION: Positive for acute PE with CT evidence of right heart strain (RV/LV Ratio = 1.5) consistent with at least submassive (intermediate risk) PE. The presence of right heart strain has been associated with an increased risk of morbidity and mortality. Please activate Code PE by paging [phone]. Sizable hiatal hernia with most of the stomach above the diaphragm. Ground-glass opacities on the left, largest measuring 1.2 x 0.8 cm. Followup noncontrast  enhanced CT in 3 months advised to assess for stability. It should be noted that atypical neoplasm could present in this manner. There are foci of coronary artery calcification. There is left ventricular hypertrophy. No demonstrable adenopathy. Critical Value/emergent results were called by telephone at the time of interpretation on 08/15/2015 at 5:19 pm to [facility], who verbally acknowledged these results. Electronically Signed   By: Lowella Grip III M.D.   On: 08/15/2015 17:22    ASSESSMENT     Acute pulmonary embolism (HCC) with rt heart strin on CT   Atrial fibrillation (HCC)   Adult hypothyroidism   Elevated troponin level   Elevated brain natriuretic peptide (BNP) level   Pressure ulcer   CKD Lab Results  Component Value Date   CREATININE 1.07* 08/16/2015   CREATININE 1.24* 08/16/2015   CREATININE 1.10* 08/15/2015   CREATININE 1.14* 07/11/2014   CREATININE 1.22* 05/15/2014       Discussion: 82 yo WF with a plethora of health issues but is ambulatory at baseline without SOB who developed sudden SOB, mild substernal chest pain 2/10 and when presented to hospital was found to have submassive PE with rt heart strain on CT scan. She notes rt leg pain also. Denies any lower ext trauma but reports leg pain. She has a hx of Afib but is not on anticoagulation due to hx of GIB. PCCM asked to evaluate. She is not in acute distress is coherent and reports she has improved since admission.       PLAN:  Continue heparin drip. Check LEDS for possible DVT as culprit for PE.  O2 as needed.   Richardson Landry Minor ACNP Maryanna Shape PCCM Pager [phone] till 3 pm If no answer page [phone] 08/16/2015, 1:04 PM   STAFF NOTE: I, Merrie Roof, MD FACP have personally reviewed patient's available data, including medical history, events of note, physical examination and test results as part of my evaluation. I have discussed with resident/NP and other care providers such as pharmacist, RN and  RRT. In addition, I personally evaluated patient and elicited key findings of: no distress, 27 years young, reasonably active, CT reviewed, submassive with ratio 1.5 or so, likely has a DVT, risk benefit ration does NOT support ekos, clinical status does not support EKOS, 2014 PA moderate , likely why she has tolerated this clot burden so well, get doppler ensure not mobile clot, get Echo for RV fxn and PA pressure follow up, continued heparin, BUT, with her age and recent work up [place] and risk bleeding IM concerned about NOAC and irreversibility, would consider coumadin if at all need further discussions in terms of bleeding and fall risk, would get ana, esr only, would NOT start orals yet would follow if bleed on heparin little longer also, will follow up Monday, i updated daughter RN and pt in full  Lavon Paganini. Titus Mould, MD, [place] Pgr: [facility] 08/16/2015 2:21 PM

## 2015-08-17 DIAGNOSIS — I2690 Septic pulmonary embolism without acute cor pulmonale: Secondary | ICD-10-CM

## 2015-08-17 LAB — CBC WITH DIFFERENTIAL/PLATELET
BASOS PCT: 1 %
Basophils Absolute: 0.1 10*3/uL (ref 0.0–0.1)
Eosinophils Absolute: 0.2 10*3/uL (ref 0.0–0.7)
Eosinophils Relative: 1 %
HEMATOCRIT: 39.4 % (ref 36.0–46.0)
Hemoglobin: 12.1 g/dL (ref 12.0–15.0)
LYMPHS ABS: 1.6 10*3/uL (ref 0.7–4.0)
Lymphocytes Relative: 14 %
MCH: 27 pg (ref 26.0–34.0)
MCHC: 30.7 g/dL (ref 30.0–36.0)
MCV: 87.9 fL (ref 78.0–100.0)
MONO ABS: 1.5 10*3/uL — AB (ref 0.1–1.0)
MONOS PCT: 14 %
NEUTROS ABS: 7.8 10*3/uL — AB (ref 1.7–7.7)
Neutrophils Relative %: 70 %
Platelets: 191 10*3/uL (ref 150–400)
RBC: 4.48 MIL/uL (ref 3.87–5.11)
RDW: 18.7 % — AB (ref 11.5–15.5)
WBC: 11.1 10*3/uL — ABNORMAL HIGH (ref 4.0–10.5)

## 2015-08-17 LAB — COMPREHENSIVE METABOLIC PANEL
ALBUMIN: 2.4 g/dL — AB (ref 3.5–5.0)
ALK PHOS: 56 U/L (ref 38–126)
ALT: 10 U/L — AB (ref 14–54)
ANION GAP: 11 (ref 5–15)
AST: 19 U/L (ref 15–41)
BUN: 12 mg/dL (ref 6–20)
CALCIUM: 8.2 mg/dL — AB (ref 8.9–10.3)
CO2: 25 mmol/L (ref 22–32)
Chloride: 107 mmol/L (ref 101–111)
Creatinine, Ser: 1.08 mg/dL — ABNORMAL HIGH (ref 0.44–1.00)
GFR calc Af Amer: 50 mL/min — ABNORMAL LOW (ref >60)
GFR calc non Af Amer: 44 mL/min — ABNORMAL LOW (ref >60)
GLUCOSE: 118 mg/dL — AB (ref 65–99)
Potassium: 3.2 mmol/L — ABNORMAL LOW (ref 3.5–5.1)
SODIUM: 143 mmol/L (ref 135–145)
Total Bilirubin: 0.4 mg/dL (ref 0.3–1.2)
Total Protein: 5.7 g/dL — ABNORMAL LOW (ref 6.5–8.1)

## 2015-08-17 LAB — HEPARIN LEVEL (UNFRACTIONATED): HEPARIN UNFRACTIONATED: 0.5 [IU]/mL (ref 0.30–0.70)

## 2015-08-17 LAB — PROTIME-INR
INR: 1.17 (ref 0.00–1.49)
Prothrombin Time: 15.1 seconds (ref 11.6–15.2)

## 2015-08-17 LAB — GLUCOSE, CAPILLARY: Glucose-Capillary: 92 mg/dL (ref 65–99)

## 2015-08-17 MED ORDER — SODIUM CHLORIDE 0.9 % IV SOLN
INTRAVENOUS | Status: DC
  Administered 2015-08-17: 09:00:00 via INTRAVENOUS

## 2015-08-17 MED ORDER — MAGNESIUM SULFATE IN D5W 10-5 MG/ML-% IV SOLN
1.0000 g | Freq: Once | INTRAVENOUS | Status: AC
  Administered 2015-08-17: 1 g via INTRAVENOUS
  Filled 2015-08-17: qty 100

## 2015-08-17 MED ORDER — POTASSIUM CHLORIDE CRYS ER 20 MEQ PO TBCR
40.0000 meq | EXTENDED_RELEASE_TABLET | ORAL | Status: AC
  Administered 2015-08-17 (×2): 40 meq via ORAL
  Filled 2015-08-17 (×2): qty 2

## 2015-08-17 NOTE — Progress Notes (Signed)
ANTICOAGULATION CONSULT NOTE - Follow Up Consult  Pharmacy Consult for heparin Indication: pulmonary embolus  Labs:  Recent Labs  08/15/15 1545  08/15/15 1554 08/16/15 0007 08/16/15 0214 08/16/15 0526 08/16/15 1206 08/17/15 0530  HGB 13.6  --  16.3*  --   --  11.9*  --  12.1  HCT 42.8  --  48.0*  --   --  37.4  --  39.4  PLT 204  --   --   --   --  170  --  191  HEPARINUNFRC  --   --   --   --  0.57  --  0.73* 0.50  CREATININE  --   < > 1.10* 1.24*  --  1.07*  --  1.08*  TROPONINI  --   --   --  0.82*  --  0.62*  --   --   < > = values in this interval not displayed.   Assessment/Plan:  82yo female admitted 08/15/2015  With submassive PE. Pharmacy consulted to dose IV heparin for treatment. Bilateral LE doppler revealed multiple thrombi within the right common femoral artery and an IVC filter was placed on 2/11 8hr HL therapeutic (0.50) H/H stable, PLT wnl, no bleeding noted.   Plan:  Continue heparin @ 1100 units.hr Daily HL/CBC Monitor for s/s of bleeding  F/U further heparin plans

## 2015-08-17 NOTE — Evaluation (Signed)
Physical Therapy Evaluation Patient Details Name: Carla [name] MRN: [id number] DOB: 06/18/1922 Today's Date: 08/17/2015   History of Present Illness  Patient is a 82 yo female admitted 08/15/15 with SOB and weakness.  Patient with pulmonary embolism, RLE DVT, and now s/p IVC filter placement.   PMH:  Afib, osteoporosis, scoliosis  Clinical Impression  Patient presents with problems listed below.  Will benefit from acute PT to maximize functional mobility prior to discharge.  Recommend ST-SNF for continued therapy at discharge with goal to achieve Mod I level with all mobility and gait in order to return home alone.  Patient does not have 24 hour assist available. (If she could achieve Mod I level prior to d/c, could potentially return home with HHPT )    Follow Up Recommendations SNF;Supervision for mobility/OOB    Equipment Recommendations  None recommended by PT    Recommendations for Other Services       Precautions / Restrictions Precautions Precautions: Fall Restrictions Weight Bearing Restrictions: No      Mobility  Bed Mobility               General bed mobility comments: Patient is OOB in chair  Transfers Overall transfer level: Needs assistance Equipment used: Rolling walker (2 wheeled) Transfers: Sit to/from Stand Sit to Stand: Min assist         General transfer comment: Verbal cues for hand placement and to scoot to edge of chair.  Assist to steady during transfer.  Ambulation/Gait Ambulation/Gait assistance: Min guard Ambulation Distance (Feet): 34 Feet Assistive device: Rolling walker (2 wheeled) Gait Pattern/deviations: Step-through pattern;Decreased stride length;Shuffle;Trunk flexed   Gait velocity interpretation: at or above normal speed for age/gender General Gait Details: Patient demonstrates safe use of RW.  Verbal cues to stand upright, and to move at a safe pace.  Assist for safety/balance.  Patient ambulated on O2 at 3 l/min.  Her  O2 sats remained at/above 92%, and HR increased to 91.  Stairs            Wheelchair Mobility    Modified Rankin (Stroke Patients Only)       Balance                                             Pertinent Vitals/Pain Pain Assessment: No/denies pain    Home Living Family/patient expects to be discharged to:: Private residence Living Arrangements: Alone Available Help at Discharge: Family;Available PRN/intermittently (Daughter lives nearby) Type of Home: House Home Access: Stairs to enter Entrance Stairs-Rails: None Entrance Stairs-Number of Steps: 1 Home Layout: Two level;Able to live on main level with bedroom/bathroom Home Equipment: Walker - 4 wheels;Cane - single point;Shower seat      Prior Function Level of Independence: Independent with assistive device(s)         Comments: Uses rollator for ambulation.  Does not drive.  Patient is incontinent of bladder, so is up at night changing.     Hand Dominance        Extremity/Trunk Assessment   Upper Extremity Assessment: Overall WFL for tasks assessed           Lower Extremity Assessment: Generalized weakness      Cervical / Trunk Assessment: Kyphotic  Communication   Communication: No difficulties  Cognition Arousal/Alertness: Awake/alert Behavior During Therapy: WFL for tasks assessed/performed;Impulsive Overall Cognitive Status: Within Functional Limits for  tasks assessed                      General Comments      Exercises        Assessment/Plan    PT Assessment Patient needs continued PT services  PT Diagnosis Abnormality of gait;Generalized weakness   PT Problem List Decreased strength;Decreased balance;Decreased mobility;Cardiopulmonary status limiting activity  PT Treatment Interventions DME instruction;Gait training;Functional mobility training;Therapeutic activities;Therapeutic exercise;Patient/family education   PT Goals (Current goals can be found  in the Care Plan section) Acute Rehab PT Goals Patient Stated Goal: To be able to return home PT Goal Formulation: With patient/family Time For Goal Achievement: 08/24/15 Potential to Achieve Goals: Good    Frequency Min 3X/week   Barriers to discharge Decreased caregiver support Patient does not have 24 hour assist at discharge.    Co-evaluation               End of Session Equipment Utilized During Treatment: Gait belt;Oxygen Activity Tolerance: Patient tolerated treatment well Patient left: in chair;with call bell/phone within reach;with family/visitor present Nurse Communication: Mobility status         Time: 1610-9604 PT Time Calculation (min) (ACUTE ONLY): 29 min   Charges:   PT Evaluation $PT Eval High Complexity: 1 Procedure PT Treatments $Gait Training: 8-22 mins   PT G Codes:        Vena Austria 2015/08/18, 8:30 PM Durenda Hurt. Renaldo Fiddler, [facility] Acute Rehab Services Pager [phone]

## 2015-08-17 NOTE — Progress Notes (Signed)
Patient Demographics:    Carla [name], is a 82 y.o. female, DOB - [birth year], ZOX:[id number]  Admit date - 08/15/2015   Admitting Physician Bobette Mo, MD  Outpatient Primary MD for the patient is Carla Inch, MD  LOS - 2   Chief Complaint  Patient presents with  . Shortness of Breath        Subjective:    [name] today has, No headache, No chest pain, No abdominal pain - No Nausea, No new weakness tingling or numbness, No Cough - SOB.     Assessment  & Plan :     1. Acute submassive PE with mild right heart strain. Stable in stepdown, currently completely symptom free, is needing 2 L nasal cannula oxygen, continue heparin drip with coumadin D/W Dr [name] PCCM , Stable echogram , Vas US shows large R Leg clot therefore got an IVC filter placed. PCCM following.  2. Chronic atrial fibrillation [place] vasc 2 score of 4 or above. Continue amiodarone for rate control, continue heparin and transitioned to Eliquis later on.  3. Right lower extremity swelling. Large DVT Rx as in #1.  4. Hypothyroidism. Continue home dose Synthroid.  5. Mildly elevated troponin. No chest pain currently, EKG nonacute, troponin rise is in non-ACS pattern likely demand ischemia from PE, supportive care. Check echogram to evaluate wall motion and EF.    Code Status : Full  Family Communication  : Son and son in law 08-16-15  Disposition Plan  : Stay in stepdown for today  Consults  :  PCCM, IR  Procedures  :   CTA chest - submassive PE with mild right heart strain   Lower extremity venous duplex - Large R leg DVT  TTE -    Left ventricle: The cavity size was normal. There was mild focalbasal hypertrophy of the septum. Systolic function was normal. The estimated ejection fraction was in  the range     of 50% to 55%. Wall motion was normal; there were no regional wall motion abnormalities. Doppler parameters are consistent with abnormal left ventricular relaxation (grade 1 diastolic dysfunction). - Aortic valve: There was trivial regurgitation. - Mitral valve: Calcified annulus. There was mild regurgitation.  Pulmonary arteries: Systolic pressure was mildly increased. PA peak pressure: 34 mm Hg (S).  Impressions:  Normal LV systolic function; grade 1 diastolic dysfunction; traceAI; mild MR and TR; mildly elevated pulmonary pressure.  IVC filter - 08-16-15 Dr [name]   DVT Prophylaxis  :  Heparin    Lab Results  Component Value Date   PLT 191 08/17/2015    Inpatient Medications  Scheduled Meds: . acetaminophen  650 mg Oral BID  . amiodarone  100 mg Oral Daily  . calcium-vitamin D  1 tablet Oral BID  . docusate sodium  100 mg Oral BID  . levothyroxine  75 mcg Oral QAC breakfast  . magnesium sulfate 1 - 4 g bolus IVPB  1 g Intravenous Once  . pantoprazole  40 mg Oral Daily  . potassium chloride  40 mEq Oral Q4H   Continuous Infusions: . sodium chloride 10 mL/hr at 08/17/15 0854  . heparin 1,100 Units/hr (08/17/15 0838)   PRN Meds:.[DISCONTINUED] ondansetron **OR** ondansetron (ZOFRAN) IV  Antibiotics  :  Anti-infectives    None        Objective:   Filed Vitals:   08/17/15 0000 08/17/15 0330 08/17/15 0400 08/17/15 0751  BP: 103/87 135/63 157/76 124/91  Pulse: 75 79 80 65  Temp:  98 F (36.7 C)  98.1 F (36.7 C)  TempSrc:  Oral  Oral  Resp: 18 19 19 17   Height:      Weight:      SpO2: 95% 98% 97% 98%    Wt Readings from Last 3 Encounters:  08/16/15 76.5 kg (168 lb 10.4 oz)  02/27/15 77.066 kg (169 lb 14.4 oz)  08/07/14 75.978 kg (167 lb 8 oz)     Intake/Output Summary (Last 24 hours) at 08/17/15 0904 Last data filed at 08/17/15 0800  Gross per 24 hour  Intake 617.27 ml  Output      0 ml  Net 617.27 ml     Physical Exam  Awake  Alert, Oriented X 3, No new F.N deficits, Normal affect [place].AT,PERRAL Supple Neck,No JVD, No cervical lymphadenopathy appriciated.  Symmetrical Chest wall movement, Good air movement bilaterally, CTAB RRR,No Gallops,Rubs or new Murmurs, No Parasternal Heave +ve B.Sounds, Abd Soft, No tenderness, No organomegaly appriciated, No rebound - guarding or rigidity. No Cyanosis, Clubbing or edema, No new Rash or bruise, R leg swollen    Data Review:   Micro Results Recent Results (from the past 240 hour(s))  MRSA PCR Screening     Status: None   Collection Time: 08/15/15 11:58 PM  Result Value Ref Range Status   MRSA by PCR NEGATIVE NEGATIVE Final    Comment:        The GeneXpert MRSA Assay (FDA approved for NASAL specimens only), is one component of a comprehensive MRSA colonization surveillance program. It is not intended to diagnose MRSA infection nor to guide or monitor treatment for MRSA infections.     Radiology Reports Dg Chest 2 View  08/15/2015  CLINICAL DATA:  Acute shortness of breath since waking up this morning. EXAM: CHEST  2 VIEW COMPARISON:  01/08/2013 FINDINGS: The cardiomediastinal silhouette is unchanged, with a large hiatal hernia again seen. There remains elevation of the right hemidiaphragm. Chronic diffuse interstitial coarsening and more focal scarring or atelectasis in the left lung base do not appear significantly changed. There is no evidence of acute airspace consolidation, overt edema, pleural effusion, or pneumothorax. Diffuse aortic calcification is noted. No acute osseous abnormality is seen. IMPRESSION: Stable appearance of the chest without evidence of active cardiopulmonary disease. Electronically Signed   By: Sebastian Ache M.D.   On: 08/15/2015 15:33   Ct Angio Chest Pe W/cm &/or Wo Cm  08/15/2015  CLINICAL DATA:  Shortness of breath and chest pain for 1 week EXAM: CT ANGIOGRAPHY CHEST WITH CONTRAST TECHNIQUE: Multidetector CT imaging of the chest was  performed using the standard protocol during bolus administration of intravenous contrast. Multiplanar CT image reconstructions and MIPs were obtained to evaluate the vascular anatomy. CONTRAST:  OMNIPAQUE IOHEXOL 350 MG/ML SOLN COMPARISON:  Chest CT August 21, 2012; chest radiograph August 15, 2015 FINDINGS: There is extensive pulmonary embolism with pulmonary emboli arising from each main pulmonary artery and extending into upper and proximal lower lobe pulmonary arteries bilaterally. There is more pulmonary embolus on the right than on the left with extensive pulmonary embolus extending into several right lower lobe pulmonary branches as well as into the right middle and right upper lobe pulmonary branches. There is right heart strain with the  right ventricle to left ventricle diameter ratio of 1.5, normal less than 0.9. There is no thoracic aortic aneurysm or dissection. There is atherosclerotic change throughout much of the aorta. The visualized great vessels appear normal except for moderate atherosclerotic calcification at the origin of the left subclavian artery. There is left ventricular hypertrophy. The pericardium is not thickened. There are foci of coronary artery calcification. There is a small calcified granuloma in the lateral left base. There is a focal area of ground-glass opacity in the periphery of the anterior segment of the left upper lobe measuring 0.8 x 0.7 cm. There is similar-appearing ground-glass type opacity in the anterior left apex measuring 1.2 x 0.8 cm. Thyroid appears normal. There is no appreciable thoracic adenopathy. There is a sizable hiatal type hernia with most of the stomach above the diaphragm. In the visualized upper abdomen, there is atherosclerotic calcification in the aorta. Gallbladder is absent. Incomplete visualization of the pancreas suggests that there is moderate fatty infiltration in the pancreas. There is contrast in portions of the inferior vena cava  and hepatic arteries. There is degenerative change in the thoracic spine with increase in kyphosis. There are no blastic or lytic bone lesions. Review of the MIP images confirms the above findings. IMPRESSION: Positive for acute PE with CT evidence of right heart strain (RV/LV Ratio = 1.5) consistent with at least submassive (intermediate risk) PE. The presence of right heart strain has been associated with an increased risk of morbidity and mortality. Please activate Code PE by paging [phone]. Sizable hiatal hernia with most of the stomach above the diaphragm. Ground-glass opacities on the left, largest measuring 1.2 x 0.8 cm. Followup noncontrast enhanced CT in 3 months advised to assess for stability. It should be noted that atypical neoplasm could present in this manner. There are foci of coronary artery calcification. There is left ventricular hypertrophy. No demonstrable adenopathy. Critical Value/emergent results were called by telephone at the time of interpretation on 08/15/2015 at 5:19 pm to [facility] [place], who verbally acknowledged these results. Electronically Signed   By: Bretta Bang III M.D.   On: 08/15/2015 17:22   Ir Ivc Filter Plmt / S&i /img Guid/mod Sed  08/16/2015  INDICATION: History of pulmonary embolism and right lower extremity DVT. Patient with history of atrial fibrillation, however is not a candidate for anticoagulation secondary to multiple gynecological issues with associated anti coagulation induced bleeding. As such, request made for placement of an IVC filter for caval interruption purposes. EXAM: ULTRASOUND GUIDANCE FOR VASCULAR ACCESS IVC CATHETERIZATION AND VENOGRAM IVC FILTER INSERTION COMPARISON:  Chest CT - 08/15/2015; CT abdomen and pelvis - 01/08/2013 MEDICATIONS: None. ANESTHESIA/SEDATION: Moderate (conscious) sedation was employed during this procedure. A total of Versed 1 mg and Fentanyl 50 mcg was administered intravenously. Moderate Sedation Time: 10 minutes.  The patient's level of consciousness and vital signs were monitored continuously by radiology nursing throughout the procedure under my direct supervision. CONTRAST:  None, CO2 was utilized for this examination is secondary to patient's renal insufficiency. FLUOROSCOPY TIME:  1 minutes 24 seconds (55 mGy) COMPLICATIONS: None immediate PROCEDURE: Informed consent was obtained from the patient following explanation of the procedure, risks, benefits and alternatives. The patient understands, agrees and consents for the procedure. All questions were addressed. A time out was performed prior to the initiation of the procedure. Maximal barrier sterile technique utilized including caps, mask, sterile gowns, sterile gloves, large sterile drape, hand hygiene, and Betadine prep. Under sterile condition and local anesthesia, right internal  jugular venous access was performed with ultrasound. An ultrasound image was saved and sent to PACS. Over a guidewire, the IVC filter delivery sheath and inner dilator were advanced into the IVC just above the IVC bifurcation. Contrast injection was performed for an IVC venogram. Through the delivery sheath, a retrievable Denali IVC filter was deployed below the level of the renal veins and above the IVC bifurcation. Limited post deployment venacavagram was performed. The delivery sheath was removed and hemostasis was obtained with manual compression. A dressing was placed. The patient tolerated the procedure well without immediate post procedural complication. FINDINGS: The IVC is patent. No evidence of thrombus, stenosis, or occlusion. No variant venous anatomy. Successful placement of the IVC filter below the level of the renal veins. IMPRESSION: Successful ultrasound and fluoroscopically guided placement of an infrarenal retrievable IVC filter via right jugular approach. Due to patient related comorbidities and/or clinical necessity, this IVC filter should be considered a permanent  device. This patient will not be actively followed for future filter retrieval. Electronically Signed   By: Simonne Come M.D.   On: 08/16/2015 19:40     CBC  Recent Labs Lab 08/15/15 1545 08/15/15 1554 08/16/15 0526 08/17/15 0530  WBC 10.8*  --  10.6* 11.1*  HGB 13.6 16.3* 11.9* 12.1  HCT 42.8 48.0* 37.4 39.4  PLT 204  --  170 191  MCV 87.7  --  87.2 87.9  MCH 27.9  --  27.7 27.0  MCHC 31.8  --  31.8 30.7  RDW 18.3*  --  18.5* 18.7*  LYMPHSABS 0.9  --  2.0 1.6  MONOABS 1.2*  --  1.4* 1.5*  EOSABS 0.0  --  0.1 0.2  BASOSABS 0.0  --  0.1 0.1    Chemistries   Recent Labs Lab 08/15/15 1554 08/16/15 0007 08/16/15 0526 08/17/15 0530  NA 142 143 144 143  K 3.9 3.5 3.8 3.2*  CL 103 107 105 107  CO2  --  26 26 25   GLUCOSE 128* 146* 115* 118*  BUN 19 11 12 12   CREATININE 1.10* 1.24* 1.07* 1.08*  CALCIUM  --  8.3* 8.3* 8.2*  MG  --  1.7  --   --   AST  --   --  19 19  ALT  --   --  9* 10*  ALKPHOS  --   --  59 56  BILITOT  --   --  0.3 0.4   ------------------------------------------------------------------------------------------------------------------ No results for input(s): CHOL, HDL, LDLCALC, TRIG, CHOLHDL, LDLDIRECT in the last 72 hours.  Lab Results  Component Value Date   HGBA1C 6.1* 01/08/2013   ------------------------------------------------------------------------------------------------------------------ No results for input(s): TSH, T4TOTAL, T3FREE, THYROIDAB in the last 72 hours.  Invalid input(s): FREET3 ------------------------------------------------------------------------------------------------------------------ No results for input(s): VITAMINB12, FOLATE, FERRITIN, TIBC, IRON, RETICCTPCT in the last 72 hours.  Coagulation profile No results for input(s): INR, PROTIME in the last 168 hours.  No results for input(s): DDIMER in the last 72 hours.  Cardiac Enzymes  Recent Labs Lab 08/16/15 0007 08/16/15 0526  TROPONINI 0.82* 0.62*    ------------------------------------------------------------------------------------------------------------------    Component Value Date/Time   BNP 295.4* 08/15/2015 1545    Time Spent in minutes   35   SINGH,PRASHANT K M.D on 08/17/2015 at 9:04 AM  Between 7am to 7pm - Pager - [phone]  After 7pm go to [email or web address] - password [facility]  [facility] -  Office  [phone]

## 2015-08-17 NOTE — Progress Notes (Signed)
Pt had several loose bm's . Md notified to see if want to r/o c-dif.  Will continue to monitor. Karena Addison T

## 2015-08-17 NOTE — Progress Notes (Signed)
Referring Physician(s): Tyson Alias (CCM)  Chief Complaint:  F/U After IVC Filter placement by Dr. Grace [name] on 08/16/15  Subjective:  Carla [name] is doing well today.  She is sitting up in the chair. No complaints.  Allergies: Bactrim and Nsaids  Medications: Prior to Admission medications   Medication Sig Start Date End Date Taking? Authorizing Provider  acetaminophen (TYLENOL) 650 MG CR tablet Take 650 mg by mouth 2 (two) times daily.   Yes Historical Provider, MD  amiodarone (PACERONE) 200 MG tablet TAKE 1/2 TABLET BY MOUTH DAILY Patient taking differently: Take 100 mg by mouth daily. 08/13/15  Yes Peter M [place], MD  Calcium Carb-Cholecalciferol (CALCIUM-VITAMIN D) 600-400 MG-UNIT TABS Take 1 tablet by mouth 2 (two) times daily.    Yes Historical Provider, MD  docusate sodium (COLACE) 100 MG capsule Take 100 mg by mouth 2 (two) times daily.    Yes Historical Provider, MD  levothyroxine (SYNTHROID, LEVOTHROID) 75 MCG tablet Take 75 mcg by mouth daily before breakfast.   Yes Historical Provider, MD  nitrofurantoin (MACRODANTIN) 50 MG capsule Take 50 mg by mouth. 12/27/14  Yes Historical Provider, MD  pantoprazole (PROTONIX) 40 MG tablet Take 40 mg by mouth daily. 01/10/13  Yes Dorothea Ogle, MD     Vital Signs: BP 124/91 mmHg  Pulse 65  Temp(Src) 98.1 F (36.7 C) (Oral)  Resp 17  Ht 5' 2.5" (1.588 m)  Wt 168 lb 10.4 oz (76.5 kg)  BMI 30.34 kg/m2  SpO2 98%  Physical Exam  Awake and Alert NAD Right IJ stick site looks good, no bleeding or hematoma  Imaging: Dg Chest 2 View  08/15/2015  CLINICAL DATA:  Acute shortness of breath since waking up this morning. EXAM: CHEST  2 VIEW COMPARISON:  01/08/2013 FINDINGS: The cardiomediastinal silhouette is unchanged, with a large hiatal hernia again seen. There remains elevation of the right hemidiaphragm. Chronic diffuse interstitial coarsening and more focal scarring or atelectasis in the left lung base do not appear significantly  changed. There is no evidence of acute airspace consolidation, overt edema, pleural effusion, or pneumothorax. Diffuse aortic calcification is noted. No acute osseous abnormality is seen. IMPRESSION: Stable appearance of the chest without evidence of active cardiopulmonary disease. Electronically Signed   By: Sebastian Ache M.D.   On: 08/15/2015 15:33   Ct Angio Chest Pe W/cm &/or Wo Cm  08/15/2015  CLINICAL DATA:  Shortness of breath and chest pain for 1 week EXAM: CT ANGIOGRAPHY CHEST WITH CONTRAST TECHNIQUE: Multidetector CT imaging of the chest was performed using the standard protocol during bolus administration of intravenous contrast. Multiplanar CT image reconstructions and MIPs were obtained to evaluate the vascular anatomy. CONTRAST:  OMNIPAQUE IOHEXOL 350 MG/ML SOLN COMPARISON:  Chest CT August 21, 2012; chest radiograph August 15, 2015 FINDINGS: There is extensive pulmonary embolism with pulmonary emboli arising from each main pulmonary artery and extending into upper and proximal lower lobe pulmonary arteries bilaterally. There is more pulmonary embolus on the right than on the left with extensive pulmonary embolus extending into several right lower lobe pulmonary branches as well as into the right middle and right upper lobe pulmonary branches. There is right heart strain with the right ventricle to left ventricle diameter ratio of 1.5, normal less than 0.9. There is no thoracic aortic aneurysm or dissection. There is atherosclerotic change throughout much of the aorta. The visualized great vessels appear normal except for moderate atherosclerotic calcification at the origin of the left subclavian artery. There  is left ventricular hypertrophy. The pericardium is not thickened. There are foci of coronary artery calcification. There is a small calcified granuloma in the lateral left base. There is a focal area of ground-glass opacity in the periphery of the anterior segment of the left upper  lobe measuring 0.8 x 0.7 cm. There is similar-appearing ground-glass type opacity in the anterior left apex measuring 1.2 x 0.8 cm. Thyroid appears normal. There is no appreciable thoracic adenopathy. There is a sizable hiatal type hernia with most of the stomach above the diaphragm. In the visualized upper abdomen, there is atherosclerotic calcification in the aorta. Gallbladder is absent. Incomplete visualization of the pancreas suggests that there is moderate fatty infiltration in the pancreas. There is contrast in portions of the inferior vena cava and hepatic arteries. There is degenerative change in the thoracic spine with increase in kyphosis. There are no blastic or lytic bone lesions. Review of the MIP images confirms the above findings. IMPRESSION: Positive for acute PE with CT evidence of right heart strain (RV/LV Ratio = 1.5) consistent with at least submassive (intermediate risk) PE. The presence of right heart strain has been associated with an increased risk of morbidity and mortality. Please activate Code PE by paging [phone]. Sizable hiatal hernia with most of the stomach above the diaphragm. Ground-glass opacities on the left, largest measuring 1.2 x 0.8 cm. Followup noncontrast enhanced CT in 3 months advised to assess for stability. It should be noted that atypical neoplasm could present in this manner. There are foci of coronary artery calcification. There is left ventricular hypertrophy. No demonstrable adenopathy. Critical Value/emergent results were called by telephone at the time of interpretation on 08/15/2015 at 5:19 pm to [facility] [place], who verbally acknowledged these results. Electronically Signed   By: Bretta Bang III M.D.   On: 08/15/2015 17:22   Ir Ivc Filter Plmt / S&i /img Guid/mod Sed  08/16/2015  INDICATION: History of pulmonary embolism and right lower extremity DVT. Patient with history of atrial fibrillation, however is not a candidate for anticoagulation  secondary to multiple gynecological issues with associated anti coagulation induced bleeding. As such, request made for placement of an IVC filter for caval interruption purposes. EXAM: ULTRASOUND GUIDANCE FOR VASCULAR ACCESS IVC CATHETERIZATION AND VENOGRAM IVC FILTER INSERTION COMPARISON:  Chest CT - 08/15/2015; CT abdomen and pelvis - 01/08/2013 MEDICATIONS: None. ANESTHESIA/SEDATION: Moderate (conscious) sedation was employed during this procedure. A total of Versed 1 mg and Fentanyl 50 mcg was administered intravenously. Moderate Sedation Time: 10 minutes. The patient's level of consciousness and vital signs were monitored continuously by radiology nursing throughout the procedure under my direct supervision. CONTRAST:  None, CO2 was utilized for this examination is secondary to patient's renal insufficiency. FLUOROSCOPY TIME:  1 minutes 24 seconds (55 mGy) COMPLICATIONS: None immediate PROCEDURE: Informed consent was obtained from the patient following explanation of the procedure, risks, benefits and alternatives. The patient understands, agrees and consents for the procedure. All questions were addressed. A time out was performed prior to the initiation of the procedure. Maximal barrier sterile technique utilized including caps, mask, sterile gowns, sterile gloves, large sterile drape, hand hygiene, and Betadine prep. Under sterile condition and local anesthesia, right internal jugular venous access was performed with ultrasound. An ultrasound image was saved and sent to PACS. Over a guidewire, the IVC filter delivery sheath and inner dilator were advanced into the IVC just above the IVC bifurcation. Contrast injection was performed for an IVC venogram. Through the delivery sheath, a  retrievable Denali IVC filter was deployed below the level of the renal veins and above the IVC bifurcation. Limited post deployment venacavagram was performed. The delivery sheath was removed and hemostasis was obtained with  manual compression. A dressing was placed. The patient tolerated the procedure well without immediate post procedural complication. FINDINGS: The IVC is patent. No evidence of thrombus, stenosis, or occlusion. No variant venous anatomy. Successful placement of the IVC filter below the level of the renal veins. IMPRESSION: Successful ultrasound and fluoroscopically guided placement of an infrarenal retrievable IVC filter via right jugular approach. Due to patient related comorbidities and/or clinical necessity, this IVC filter should be considered a permanent device. This patient will not be actively followed for future filter retrieval. Electronically Signed   By: Simonne Come M.D.   On: 08/16/2015 19:40    Labs:  CBC:  Recent Labs  08/15/15 1545 08/15/15 1554 08/16/15 0526 08/17/15 0530  WBC 10.8*  --  10.6* 11.1*  HGB 13.6 16.3* 11.9* 12.1  HCT 42.8 48.0* 37.4 39.4  PLT 204  --  170 191    COAGS:  Recent Labs  08/17/15 0900  INR 1.17    BMP:  Recent Labs  08/15/15 1554 08/16/15 0007 08/16/15 0526 08/17/15 0530  NA 142 143 144 143  K 3.9 3.5 3.8 3.2*  CL 103 107 105 107  CO2  --  GLUCOSE 128* 146* 115* 118*  BUN CALCIUM  --  8.3* 8.3* 8.2*  CREATININE 1.10* 1.24* 1.07* 1.08*  GFRNONAA  --  37* 44* 44*  GFRAA  --  43* 51* 50*    LIVER FUNCTION TESTS:  Recent Labs  08/16/15 0526 08/17/15 0530  BILITOT 0.3 0.4  AST 19 19  ALT 9* 10*  ALKPHOS 59 56  PROT 5.7* 5.7*  ALBUMIN 2.5* 2.4*    Assessment and Plan:  S/P IVC filter placement by Dr. Grace [name].  This will likely need to remain lifelong given her history of bleeding on anticoagulation.  Electronically Signed: Gwynneth Macleod PA-C 08/17/2015, 10:12 AM   I spent a total of 15 Minutes at the the patient's bedside AND on the patient's hospital floor or unit, greater than 50% of which was counseling/coordinating care for F/U after IVC filter.

## 2015-08-18 DIAGNOSIS — R0902 Hypoxemia: Secondary | ICD-10-CM

## 2015-08-18 LAB — GLUCOSE, CAPILLARY: Glucose-Capillary: 121 mg/dL — ABNORMAL HIGH (ref 65–99)

## 2015-08-18 LAB — BASIC METABOLIC PANEL
ANION GAP: 13 (ref 5–15)
BUN: 20 mg/dL (ref 6–20)
CO2: 23 mmol/L (ref 22–32)
Calcium: 8.8 mg/dL — ABNORMAL LOW (ref 8.9–10.3)
Chloride: 106 mmol/L (ref 101–111)
Creatinine, Ser: 1.49 mg/dL — ABNORMAL HIGH (ref 0.44–1.00)
GFR calc Af Amer: 34 mL/min — ABNORMAL LOW (ref >60)
GFR, EST NON AFRICAN AMERICAN: 29 mL/min — AB (ref >60)
GLUCOSE: 111 mg/dL — AB (ref 65–99)
POTASSIUM: 5.1 mmol/L (ref 3.5–5.1)
Sodium: 142 mmol/L (ref 135–145)

## 2015-08-18 LAB — MAGNESIUM: MAGNESIUM: 2.1 mg/dL (ref 1.7–2.4)

## 2015-08-18 LAB — CBC
HEMATOCRIT: 39.1 % (ref 36.0–46.0)
HEMOGLOBIN: 11.9 g/dL — AB (ref 12.0–15.0)
MCH: 27 pg (ref 26.0–34.0)
MCHC: 30.4 g/dL (ref 30.0–36.0)
MCV: 88.7 fL (ref 78.0–100.0)
Platelets: 198 10*3/uL (ref 150–400)
RBC: 4.41 MIL/uL (ref 3.87–5.11)
RDW: 18.9 % — ABNORMAL HIGH (ref 11.5–15.5)
WBC: 10.5 10*3/uL (ref 4.0–10.5)

## 2015-08-18 LAB — HEPARIN LEVEL (UNFRACTIONATED): Heparin Unfractionated: 0.57 IU/mL (ref 0.30–0.70)

## 2015-08-18 MED ORDER — COUMADIN BOOK
Freq: Once | Status: AC
  Administered 2015-08-18: 12:00:00
  Filled 2015-08-18 (×2): qty 1

## 2015-08-18 MED ORDER — WARFARIN - PHARMACIST DOSING INPATIENT
Freq: Every day | Status: DC
  Administered 2015-08-19 – 2015-08-20 (×2)

## 2015-08-18 MED ORDER — WARFARIN VIDEO
Freq: Once | Status: AC
  Administered 2015-08-18: 12:00:00

## 2015-08-18 MED ORDER — WARFARIN SODIUM 5 MG PO TABS
5.0000 mg | ORAL_TABLET | Freq: Once | ORAL | Status: AC
  Administered 2015-08-18: 5 mg via ORAL
  Filled 2015-08-18: qty 1

## 2015-08-18 NOTE — Clinical Documentation Improvement (Signed)
Hospitalist  Can the diagnosis of pressure ulcer be further specified?   Document if pressure ulcer with stage is Present on Admission   Document Site with laterality - Elbow, Back (upper/lower), Sacral, Hip, Buttock, Ankle, Heel, Head, Other (Specify)  Pressure Ulcer Stage - Stage1, Stage 2, Stage 3, Stage 4, Unstageable, Unspecified, Unable to Clinically Determine  Other  Clinically Undetermined    Supporting Information: Patient with pressure ulcer per 02/11 progress notes.  Stage 1 pressure ulcer of sacrum per 02/11 RN note.   Please exercise your independent, professional judgment when responding. A specific answer is not anticipated or expected.   Thank Sabino Donovan [facility] [employer] [facility] [place] [phone]

## 2015-08-18 NOTE — Care Management Note (Signed)
Case Management Note  Patient Details  Name: Carla [name] MRN: [id number] Date of Birth: 05-04-1922  Subjective/Objective:   Adm w pul embolus                 Action/Plan: lives at home, has da   Expected Discharge Date:                  Expected Discharge Plan:     In-House Referral:     Discharge planning Services     Post Acute Care Choice:    Choice offered to:     DME Arranged:    DME Agency:     HH Arranged:    HH Agency:     Status of Service:     Medicare Important Message Given:    Date Medicare IM Given:    Medicare IM give by:    Date Additional Medicare IM Given:    Additional Medicare Important Message give by:     If discussed at Long Length of Stay Meetings, dates discussed:    Additional Comments: ur review done  Hanley Hays, RN 08/18/2015, 10:50 AM

## 2015-08-18 NOTE — Care Management Important Message (Signed)
Important Message  Patient Details  Name: Carla [name] MRN: [id number] Date of Birth: 1922/07/02   Medicare Important Message Given:  Yes    Wilberta Dorvil P Mechel Schutter 08/18/2015, 1:32 PM

## 2015-08-18 NOTE — Progress Notes (Addendum)
ANTICOAGULATION CONSULT NOTE - Follow Up Consult  Pharmacy Consult for Heparin; add Coumadin Indication: PE/DVT  Allergies  Allergen Reactions  . Bactrim [Sulfamethoxazole-Trimethoprim] Other (See Comments)    Has chronic kidney disease.  This is not recommended by nephrologist.    . Nsaids Other (See Comments)    Has chronic kidney disease.  This is not recommended by nephrologist.    Patient Measurements: Height: 5' 2.5" (158.8 cm) Weight: 168 lb 10.4 oz (76.5 kg) IBW/kg (Calculated) : 51.25 Heparin Dosing Weight: 67.6kg  Vital Signs: Temp: 97.7 F (36.5 C) (02/13 0741) Temp Source: Oral (02/13 0741) BP: 160/81 mmHg (02/13 0741) Pulse Rate: 69 (02/13 0741)  Labs:  Recent Labs  08/16/15 0007  08/16/15 0526 08/16/15 1206 08/17/15 0530 08/17/15 0900 08/18/15 0231  HGB  --   --  11.9*  --  12.1  --  11.9*  HCT  --   --  37.4  --  39.4  --  39.1  PLT  --   --  170  --  191  --  198  LABPROT  --   --   --   --   --  15.1  --   INR  --   --   --   --   --  1.17  --   HEPARINUNFRC  --   < >  --  0.73* 0.50  --  0.57  CREATININE 1.24*  --  1.07*  --  1.08*  --  1.49*  TROPONINI 0.82*  --  0.62*  --   --   --   --   < > = values in this interval not displayed.  Estimated Creatinine Clearance: 23.8 mL/min (by C-G formula based on Cr of 1.49).   Medications:  Heparin @ 1100 units/hr  Assessment: 89yof continues on heparin for PE/DVT. Heparin level is therapeutic at 0.57. CBC stable. No bleeding reported.  2/10 CT angio: submassive PE with mild right heart strain 2/11 Bilateral LE dopplers revealed multiple thrombi within the right common femoral artery  2/11 IVC filter placed  Goal of Therapy:  Heparin level 0.3-0.7 units/ml Monitor platelets by anticoagulation protocol: Yes   Plan:  1) Continue heparin at 1100 units/hr 2) Heparin level, CBC in AM 3) Follow up transition to PO anticoagulation  Fredrik Rigger 08/18/2015,9:04 AM   Addendum: Patient to  now start coumadin. Baseline INR 1.17. Coumadin score = 3. Will need to watch for drug interaction with amiodarone.  Plan: 1) Coumadin  x 1 2) Daily INR 3) Coumadin education, book, video  She will need at least 5 days of overlap therapy with heparin and coumadin. Today will be day #1.  Fredrik Rigger 08/18/2015, 11:24 AM

## 2015-08-18 NOTE — Progress Notes (Signed)
8:18 PM report received

## 2015-08-18 NOTE — Progress Notes (Signed)
SATURATION QUALIFICATIONS: (This note is used to comply with regulatory documentation for home oxygen)  Patient Saturations on Room Air at Rest = 96%  Patient Saturations on Room Air while Ambulating = 88% limited distance  Patient Saturations on 2 Liters of oxygen while Ambulating = 94%  Please briefly explain why patient needs home oxygen:Patient desaturating on room air with minimal ambulation.  Charlotte Crumb, PT DPT  [phone]

## 2015-08-18 NOTE — Progress Notes (Signed)
Pt satting 92% on RA while at rest.  Ambulated pt on RA approximately 170 ft.  O2 sat while ambulating dropped to 87%.  Pt described, "feeling slightly sob". After ambulating returned pt to chair and pt recovered back to 91% on RA while at rest.

## 2015-08-18 NOTE — Progress Notes (Signed)
Patient Demographics:    Carla [name], is a 82 y.o. female, DOB - 06/30/1922, ZOX:[id number]  Admit date - 08/15/2015   Admitting Physician Bobette Mo, MD  Outpatient Primary MD for the patient is Carla Inch, MD  LOS - 3   Chief Complaint  Patient presents with  . Shortness of Breath        Subjective:    [name] today has, No headache, No chest pain, No abdominal pain - No Nausea, No new weakness tingling or numbness, No Cough - SOB.     Assessment  & Plan :     1. Acute submassive PE with mild right heart strain. Stable in stepdown, currently completely symptom free, is needing 2 L nasal cannula oxygen and will qualify for home o2, continue heparin drip with coumadin D/W Dr [name] PCCM , Stable echogram , Vas US shows large R Leg clot therefore got an IVC filter placed. PCCM following.  2. Chronic atrial fibrillation [place] vasc 2 score of 4 or above. Continue amiodarone for rate control, continue heparin and transitioned to Coumadin.  3. Right lower extremity swelling. Large DVT Rx as in #1.  4. Hypothyroidism. Continue home dose Synthroid.  5. Mildly elevated troponin. No chest pain currently, EKG nonacute, troponin rise is in non-ACS pattern likely demand ischemia from PE, supportive care. Check echogram to evaluate wall motion and EF.  6. CKD 4 - at baseline of creat 1.4.    Code Status : Full  Family Communication  : Son and son in law 08-16-15, daughter on 08-18-15  Disposition Plan  : Stay in stepdown for today  Consults  :  PCCM, IR  Procedures  :   CTA chest - submassive PE with mild right heart strain   Lower extremity venous duplex - Large R leg DVT  TTE -  Left ventricle: The cavity size was normal. There was mild focalbasal hypertrophy of the  septum. Systolic function was normal. The estimated ejection fraction was in the range of 50% to 55%. Wall motion was normal; there were no regional wall motion abnormalities. Doppler parameters are consistent with abnormalleft ventricular relaxation (grade 1 diastolic dysfunction). - Aortic valve: There was trivial regurgitation. - Mitral valve: Calcified annulus. There was mild regurgitation.  Pulmonary arteries: Systolic pressure was mildly increased. PA peak pressure: 34 mm Hg (S).  Impressions:  Normal LV systolic function; grade 1 diastolic dysfunction; traceAI; mild MR and TR; mildly elevated pulmonary pressure.   IVC filter - 08-16-15 Dr [name]    DVT Prophylaxis  :  Heparin/Coumadin    Lab Results  Component Value Date   PLT 198 08/18/2015    Inpatient Medications  Scheduled Meds: . acetaminophen  650 mg Oral BID  . amiodarone  100 mg Oral Daily  . calcium-vitamin D  1 tablet Oral BID  . docusate sodium  100 mg Oral BID  . levothyroxine  75 mcg Oral QAC breakfast  . pantoprazole  40 mg Oral Daily   Continuous Infusions: . sodium chloride 10 mL/hr at 08/18/15 1000  . heparin 1,100 Units/hr (08/18/15 1000)   PRN Meds:.[DISCONTINUED] ondansetron **OR** ondansetron (ZOFRAN) IV  Antibiotics  :     Anti-infectives    None  Objective:   Filed Vitals:   08/18/15 0800 08/18/15 0815 08/18/15 0931 08/18/15 1000  BP: 152/63     Pulse:   91 75  Temp:      TempSrc:      Resp:   27 28  Height:      Weight:      SpO2:  98% 93% 91%    Wt Readings from Last 3 Encounters:  08/16/15 76.5 kg (168 lb 10.4 oz)  02/27/15 77.066 kg (169 lb 14.4 oz)  08/07/14 75.978 kg (167 lb 8 oz)     Intake/Output Summary (Last 24 hours) at 08/18/15 1048 Last data filed at 08/18/15 1000  Gross per 24 hour  Intake   1589 ml  Output      0 ml  Net   1589 ml     Physical Exam  Awake Alert, Oriented X 3, No new F.N deficits, Normal affect [place].AT,PERRAL Supple Neck,No JVD,  No cervical lymphadenopathy appriciated.  Symmetrical Chest wall movement, Good air movement bilaterally, CTAB RRR,No Gallops,Rubs or new Murmurs, No Parasternal Heave +ve B.Sounds, Abd Soft, No tenderness, No organomegaly appriciated, No rebound - guarding or rigidity. No Cyanosis, Clubbing or edema, No new Rash or bruise, R leg swollen    Data Review:   Micro Results Recent Results (from the past 240 hour(s))  MRSA PCR Screening     Status: None   Collection Time: 08/15/15 11:58 PM  Result Value Ref Range Status   MRSA by PCR NEGATIVE NEGATIVE Final    Comment:        The GeneXpert MRSA Assay (FDA approved for NASAL specimens only), is one component of a comprehensive MRSA colonization surveillance program. It is not intended to diagnose MRSA infection nor to guide or monitor treatment for MRSA infections.     Radiology Reports Dg Chest 2 View  08/15/2015  CLINICAL DATA:  Acute shortness of breath since waking up this morning. EXAM: CHEST  2 VIEW COMPARISON:  01/08/2013 FINDINGS: The cardiomediastinal silhouette is unchanged, with a large hiatal hernia again seen. There remains elevation of the right hemidiaphragm. Chronic diffuse interstitial coarsening and more focal scarring or atelectasis in the left lung base do not appear significantly changed. There is no evidence of acute airspace consolidation, overt edema, pleural effusion, or pneumothorax. Diffuse aortic calcification is noted. No acute osseous abnormality is seen. IMPRESSION: Stable appearance of the chest without evidence of active cardiopulmonary disease. Electronically Signed   By: Sebastian Ache M.D.   On: 08/15/2015 15:33   Ct Angio Chest Pe W/cm &/or Wo Cm  08/15/2015  CLINICAL DATA:  Shortness of breath and chest pain for 1 week EXAM: CT ANGIOGRAPHY CHEST WITH CONTRAST TECHNIQUE: Multidetector CT imaging of the chest was performed using the standard protocol during bolus administration of intravenous contrast.  Multiplanar CT image reconstructions and MIPs were obtained to evaluate the vascular anatomy. CONTRAST:  OMNIPAQUE IOHEXOL 350 MG/ML SOLN COMPARISON:  Chest CT August 21, 2012; chest radiograph August 15, 2015 FINDINGS: There is extensive pulmonary embolism with pulmonary emboli arising from each main pulmonary artery and extending into upper and proximal lower lobe pulmonary arteries bilaterally. There is more pulmonary embolus on the right than on the left with extensive pulmonary embolus extending into several right lower lobe pulmonary branches as well as into the right middle and right upper lobe pulmonary branches. There is right heart strain with the right ventricle to left ventricle diameter ratio of 1.5, normal less than 0.9. There  is no thoracic aortic aneurysm or dissection. There is atherosclerotic change throughout much of the aorta. The visualized great vessels appear normal except for moderate atherosclerotic calcification at the origin of the left subclavian artery. There is left ventricular hypertrophy. The pericardium is not thickened. There are foci of coronary artery calcification. There is a small calcified granuloma in the lateral left base. There is a focal area of ground-glass opacity in the periphery of the anterior segment of the left upper lobe measuring 0.8 x 0.7 cm. There is similar-appearing ground-glass type opacity in the anterior left apex measuring 1.2 x 0.8 cm. Thyroid appears normal. There is no appreciable thoracic adenopathy. There is a sizable hiatal type hernia with most of the stomach above the diaphragm. In the visualized upper abdomen, there is atherosclerotic calcification in the aorta. Gallbladder is absent. Incomplete visualization of the pancreas suggests that there is moderate fatty infiltration in the pancreas. There is contrast in portions of the inferior vena cava and hepatic arteries. There is degenerative change in the thoracic spine with increase in  kyphosis. There are no blastic or lytic bone lesions. Review of the MIP images confirms the above findings. IMPRESSION: Positive for acute PE with CT evidence of right heart strain (RV/LV Ratio = 1.5) consistent with at least submassive (intermediate risk) PE. The presence of right heart strain has been associated with an increased risk of morbidity and mortality. Please activate Code PE by paging [phone]. Sizable hiatal hernia with most of the stomach above the diaphragm. Ground-glass opacities on the left, largest measuring 1.2 x 0.8 cm. Followup noncontrast enhanced CT in 3 months advised to assess for stability. It should be noted that atypical neoplasm could present in this manner. There are foci of coronary artery calcification. There is left ventricular hypertrophy. No demonstrable adenopathy. Critical Value/emergent results were called by telephone at the time of interpretation on 08/15/2015 at 5:19 pm to [facility], who verbally acknowledged these results. Electronically Signed   By: Bretta Bang III M.D.   On: 08/15/2015 17:22   Ir Ivc Filter Plmt / S&i /img Guid/mod Sed  08/16/2015  INDICATION: History of pulmonary embolism and right lower extremity DVT. Patient with history of atrial fibrillation, however is not a candidate for anticoagulation secondary to multiple gynecological issues with associated anti coagulation induced bleeding. As such, request made for placement of an IVC filter for caval interruption purposes. EXAM: ULTRASOUND GUIDANCE FOR VASCULAR ACCESS IVC CATHETERIZATION AND VENOGRAM IVC FILTER INSERTION COMPARISON:  Chest CT - 08/15/2015; CT abdomen and pelvis - 01/08/2013 MEDICATIONS: None. ANESTHESIA/SEDATION: Moderate (conscious) sedation was employed during this procedure. A total of Versed 1 mg and Fentanyl 50 mcg was administered intravenously. Moderate Sedation Time: 10 minutes. The patient's level of consciousness and vital signs were monitored continuously by  radiology nursing throughout the procedure under my direct supervision. CONTRAST:  None, CO2 was utilized for this examination is secondary to patient's renal insufficiency. FLUOROSCOPY TIME:  1 minutes 24 seconds (55 mGy) COMPLICATIONS: None immediate PROCEDURE: Informed consent was obtained from the patient following explanation of the procedure, risks, benefits and alternatives. The patient understands, agrees and consents for the procedure. All questions were addressed. A time out was performed prior to the initiation of the procedure. Maximal barrier sterile technique utilized including caps, mask, sterile gowns, sterile gloves, large sterile drape, hand hygiene, and Betadine prep. Under sterile condition and local anesthesia, right internal jugular venous access was performed with ultrasound. An ultrasound image was saved and sent  to PACS. Over a guidewire, the IVC filter delivery sheath and inner dilator were advanced into the IVC just above the IVC bifurcation. Contrast injection was performed for an IVC venogram. Through the delivery sheath, a retrievable Denali IVC filter was deployed below the level of the renal veins and above the IVC bifurcation. Limited post deployment venacavagram was performed. The delivery sheath was removed and hemostasis was obtained with manual compression. A dressing was placed. The patient tolerated the procedure well without immediate post procedural complication. FINDINGS: The IVC is patent. No evidence of thrombus, stenosis, or occlusion. No variant venous anatomy. Successful placement of the IVC filter below the level of the renal veins. IMPRESSION: Successful ultrasound and fluoroscopically guided placement of an infrarenal retrievable IVC filter via right jugular approach. Due to patient related comorbidities and/or clinical necessity, this IVC filter should be considered a permanent device. This patient will not be actively followed for future filter retrieval.  Electronically Signed   By: Simonne Come M.D.   On: 08/16/2015 19:40     CBC  Recent Labs Lab 08/15/15 1545 08/15/15 1554 08/16/15 0526 08/17/15 0530 08/18/15 0231  WBC 10.8*  --  10.6* 11.1* 10.5  HGB 13.6 16.3* 11.9* 12.1 11.9*  HCT 42.8 48.0* 37.4 39.4 39.1  PLT 204  --  170 191 198  MCV 87.7  --  87.2 87.9 88.7  MCH 27.9  --  27.7 27.0 27.0  MCHC 31.8  --  31.8 30.7 30.4  RDW 18.3*  --  18.5* 18.7* 18.9*  LYMPHSABS 0.9  --  2.0 1.6  --   MONOABS 1.2*  --  1.4* 1.5*  --   EOSABS 0.0  --  0.1 0.2  --   BASOSABS 0.0  --  0.1 0.1  --     Chemistries   Recent Labs Lab 08/15/15 1554 08/16/15 0007 08/16/15 0526 08/17/15 0530 08/18/15 0231  NA 142 143 144 143 142  K 3.9 3.5 3.8 3.2* 5.1  CL 103 107 105 107 106  CO2  --  26 26 25 23   GLUCOSE 128* 146* 115* 118* 111*  BUN 19 11 12 12 20   CREATININE 1.10* 1.24* 1.07* 1.08* 1.49*  CALCIUM  --  8.3* 8.3* 8.2* 8.8*  MG  --  1.7  --   --  2.1  AST  --   --  19 19  --   ALT  --   --  9* 10*  --   ALKPHOS  --   --  59 56  --   BILITOT  --   --  0.3 0.4  --    ------------------------------------------------------------------------------------------------------------------ No results for input(s): CHOL, HDL, LDLCALC, TRIG, CHOLHDL, LDLDIRECT in the last 72 hours.  Lab Results  Component Value Date   HGBA1C 6.1* 01/08/2013   ------------------------------------------------------------------------------------------------------------------ No results for input(s): TSH, T4TOTAL, T3FREE, THYROIDAB in the last 72 hours.  Invalid input(s): FREET3 ------------------------------------------------------------------------------------------------------------------ No results for input(s): VITAMINB12, FOLATE, FERRITIN, TIBC, IRON, RETICCTPCT in the last 72 hours.  Coagulation profile  Recent Labs Lab 08/17/15 0900  INR 1.17    No results for input(s): DDIMER in the last 72 hours.  Cardiac Enzymes  Recent Labs Lab  08/16/15 0007 08/16/15 0526  TROPONINI 0.82* 0.62*   ------------------------------------------------------------------------------------------------------------------    Component Value Date/Time   BNP 295.4* 08/15/2015 1545    Time Spent in minutes   35   SINGH,PRASHANT K M.D on 08/18/2015 at 10:48 AM  Between 7am to 7pm - Pager -  [phone]  After 7pm go to [email or web address] - password [facility]  [facility] -  Office  [phone]

## 2015-08-18 NOTE — Clinical Social Work Note (Signed)
CSW received referral for SNF.  Case discussed with case manager, and plan is to discharge home with home health.  CSW to sign off please re-consult if social work needs arise.  Tywanda Rice R. Andrew Blasius, MSW, LCSWA [phone]  

## 2015-08-18 NOTE — Progress Notes (Signed)
Physical Therapy Treatment Patient Details Name: Carla [name] MRN: [id number] DOB: [birth year] Today's Date: 08/18/2015    History of Present Illness Patient is a 82 yo female admitted 08/15/15 with SOB and weakness.  Patient with pulmonary embolism, RLE DVT, and now s/p IVC filter placement.   PMH:  Afib, osteoporosis, scoliosis    PT Comments    Patient seen for mobility progression, daughter present at start of session. Patient assisted with self care and hygiene tasks. Patient expressed desire to return home. At this time, patient is performing transfers and ambulation at min assist to supervision levels. Highly recommend HHPT and 24/7 supervision upon initial discharge, However, if patient is not able to arrange this level of assist, recommend ST SNF.   Follow Up Recommendations  Home health PT;Supervision/Assistance - 24 hour (if can not arrange inital 24/7 may need SNF)     Equipment Recommendations  None recommended by PT    Recommendations for Other Services       Precautions / Restrictions Precautions Precautions: Fall Restrictions Weight Bearing Restrictions: No    Mobility  Bed Mobility               General bed mobility comments: received from tech on [facility]  Transfers Overall transfer level: Needs assistance Equipment used: Rolling walker (2 wheeled) Transfers: Sit to/from Stand Sit to Stand: Min guard         General transfer comment: Verbal cues for positioning and hand placement when coming to and from sitting.   Ambulation/Gait Ambulation/Gait assistance: Supervision;Min guard Ambulation Distance (Feet): 180 Feet Assistive device: Rolling walker (2 wheeled) Gait Pattern/deviations: Step-through pattern;Decreased stride length;Shuffle;Trunk flexed   Gait velocity interpretation: at or above normal speed for age/gender General Gait Details: ambulated on various levels of oxygen, on room air desaturated to 87% on room air, improved to 89% on 1  liters, 94% on 2 liters (2 standing rest breaks, cued for pursed lip breathing)   Stairs            Wheelchair Mobility    Modified Rankin (Stroke Patients Only)       Balance Overall balance assessment: Needs assistance Sitting-balance support: Feet supported       Standing balance support: During functional activity Standing balance-Leahy Scale: Poor Standing balance comment: reliance on RW                    Cognition Arousal/Alertness: Awake/alert Behavior During Therapy: WFL for tasks assessed/performed;Impulsive Overall Cognitive Status: Within Functional Limits for tasks assessed                      Exercises      General Comments  Educated patient regarding safety and mobility expectations upon discharge.       Pertinent Vitals/Pain Pain Assessment: No/denies pain    Home Living                      Prior Function            PT Goals (current goals can now be found in the care plan section) Acute Rehab PT Goals Patient Stated Goal: To be able to return home PT Goal Formulation: With patient/family Time For Goal Achievement: 08/24/15 Potential to Achieve Goals: Good Progress towards PT goals: Progressing toward goals    Frequency  Min 3X/week    PT Plan Current plan remains appropriate    Co-evaluation  End of Session Equipment Utilized During Treatment: Gait belt;Oxygen Activity Tolerance: Patient tolerated treatment well Patient left: in chair;with call bell/phone within reach     Time: 1422-1447 PT Time Calculation (min) (ACUTE ONLY): 25 min  Charges:  $Gait Training: 8-22 mins $Therapeutic Activity: 8-22 mins                    G CodesFabio Asa 09/17/2015, 3:00 PM Charlotte Crumb, PT DPT  [phone]

## 2015-08-18 NOTE — Progress Notes (Signed)
Name: Carla [name] MRN: [id number] DOB: [birth year]    ADMISSION DATE:  08/15/2015 CONSULTATION DATE:  2/11  REFERRING MD :  [facility]  CHIEF COMPLAINT:  Acute sob  BRIEF PATIENT DESCRIPTION: WNWDWF  SIGNIFICANT EVENTS  2/10 PE  STUDIES:  Ct as noted LEDS>>   HISTORY OF PRESENT ILLNESS:  82 yo WF with a plethora of health issues but is ambulatory at baseline without SOB who developed sudden SOB, mild substernal chest pain 2/10 and when presented to hospital was found to have submassive PE with rt heart strain on CT scan. She notes rt leg pain also. Denies any lower ext trauma but reports leg pain. She has a hx of Afib but is not on anticoagulation due to hx of GIB. PCCM asked to evaluate. She is not in acute distress is coherent and reports she has improved since admission.   SUBJECTIVE: No events overnight, no new complaints.  VITAL SIGNS: Temp:  [97.5 F (36.4 C)-98.2 F (36.8 C)] 97.5 F (36.4 C) (02/13 1200) Pulse Rate:  [66-91] 76 (02/13 1300) Resp:  [18-28] 24 (02/13 1300) BP: (127-160)/(61-81) 146/62 mmHg (02/13 1200) SpO2:  [89 %-98 %] 94 % (02/13 1300)  PHYSICAL EXAMINATION: General:  WNWDWF NAD at rest Neuro: Intact, moving all ext to command. HEENT: No jvd/lan, [place]/AT, PERRL, EOM-I. Cardiovascular: RRR, Nl S1/S2, -M/R/G. Lungs: CTA bilaterally. Abdomen:  Soft, NT, ND and +BS. Musculoskeletal:  Intact. Skin: Left lower ext tender.  Recent Labs Lab 08/16/15 0526 08/17/15 0530 08/18/15 0231  NA 144 143 142  K 3.8 3.2* 5.1  CL 105 107 106  CO2 BUN CREATININE 1.07* 1.08* 1.49*  GLUCOSE 115* 118* 111*   Recent Labs Lab 08/16/15 0526 08/17/15 0530 08/18/15 0231  HGB 11.9* 12.1 11.9*  HCT 37.4 39.4 39.1  WBC 10.6* 11.1* 10.5  PLT 170 191 198   Ir Ivc Filter Plmt / S&i /img Guid/mod Sed  08/16/2015  INDICATION: History of pulmonary embolism and right lower extremity DVT. Patient with history of atrial fibrillation, however  is not a candidate for anticoagulation secondary to multiple gynecological issues with associated anti coagulation induced bleeding. As such, request made for placement of an IVC filter for caval interruption purposes. EXAM: ULTRASOUND GUIDANCE FOR VASCULAR ACCESS IVC CATHETERIZATION AND VENOGRAM IVC FILTER INSERTION COMPARISON:  Chest CT - 08/15/2015; CT abdomen and pelvis - 01/08/2013 MEDICATIONS: None. ANESTHESIA/SEDATION: Moderate (conscious) sedation was employed during this procedure. A total of Versed 1 mg and Fentanyl 50 mcg was administered intravenously. Moderate Sedation Time: 10 minutes. The patient's level of consciousness and vital signs were monitored continuously by radiology nursing throughout the procedure under my direct supervision. CONTRAST:  None, CO2 was utilized for this examination is secondary to patient's renal insufficiency. FLUOROSCOPY TIME:  1 minutes 24 seconds (55 mGy) COMPLICATIONS: None immediate PROCEDURE: Informed consent was obtained from the patient following explanation of the procedure, risks, benefits and alternatives. The patient understands, agrees and consents for the procedure. All questions were addressed. A time out was performed prior to the initiation of the procedure. Maximal barrier sterile technique utilized including caps, mask, sterile gowns, sterile gloves, large sterile drape, hand hygiene, and Betadine prep. Under sterile condition and local anesthesia, right internal jugular venous access was performed with ultrasound. An ultrasound image was saved and sent to PACS. Over a guidewire, the IVC filter delivery sheath and inner dilator were advanced into the IVC just above the IVC bifurcation. Contrast injection  was performed for an IVC venogram. Through the delivery sheath, a retrievable Denali IVC filter was deployed below the level of the renal veins and above the IVC bifurcation. Limited post deployment venacavagram was performed. The delivery sheath was  removed and hemostasis was obtained with manual compression. A dressing was placed. The patient tolerated the procedure well without immediate post procedural complication. FINDINGS: The IVC is patent. No evidence of thrombus, stenosis, or occlusion. No variant venous anatomy. Successful placement of the IVC filter below the level of the renal veins. IMPRESSION: Successful ultrasound and fluoroscopically guided placement of an infrarenal retrievable IVC filter via right jugular approach. Due to patient related comorbidities and/or clinical necessity, this IVC filter should be considered a permanent device. This patient will not be actively followed for future filter retrieval. Electronically Signed   By: Simonne Come M.D.   On: 08/16/2015 19:40   I reviewed chest CT myself, PE noted.  ASSESSMENT     Acute pulmonary embolism (HCC) with rt heart strin on CT   Atrial fibrillation (HCC)   Adult hypothyroidism   Elevated troponin level   Elevated brain natriuretic peptide (BNP) level   Pressure ulcer   CKD Lab Results  Component Value Date   CREATININE 1.49* 08/18/2015   CREATININE 1.08* 08/17/2015   CREATININE 1.07* 08/16/2015   CREATININE 1.14* 07/11/2014   CREATININE 1.22* 05/15/2014       Discussion: 82 yo WF with a plethora of health issues but is ambulatory at baseline without SOB who developed sudden SOB, mild substernal chest pain 2/10 and when presented to hospital was found to have submassive PE with rt heart strain on CT scan. She notes rt leg pain also. Denies any lower ext trauma but reports leg pain. She has a hx of Afib but is not on anticoagulation due to hx of GIB. PCCM asked to evaluate. She is not in acute distress is coherent and reports she has improved since admission.   PLAN:  Continue heparin drip until therapeutic. Coumadin ordered per pharmacy. IVC filter noted, would leave for 3 months then may retrieve. O2 as needed, titrate for sat of 88-92%. May need an  ambulatory desat prior to discharge inorder to wean O2.  PCCM will sign off, please call back if needed.  Alyson Reedy, M.D. [facility] Pulmonary/Critical Care Medicine. Pager: [phone]. After hours pager: [phone].  08/18/2015 2:16 PM

## 2015-08-19 DIAGNOSIS — I2699 Other pulmonary embolism without acute cor pulmonale: Secondary | ICD-10-CM

## 2015-08-19 LAB — CBC
HCT: 38.2 % (ref 36.0–46.0)
HEMOGLOBIN: 11.6 g/dL — AB (ref 12.0–15.0)
MCH: 27 pg (ref 26.0–34.0)
MCHC: 30.4 g/dL (ref 30.0–36.0)
MCV: 88.8 fL (ref 78.0–100.0)
Platelets: 211 10*3/uL (ref 150–400)
RBC: 4.3 MIL/uL (ref 3.87–5.11)
RDW: 19.1 % — ABNORMAL HIGH (ref 11.5–15.5)
WBC: 8.8 10*3/uL (ref 4.0–10.5)

## 2015-08-19 LAB — C DIFFICILE QUICK SCREEN W PCR REFLEX
C Diff antigen: NEGATIVE
C Diff interpretation: NEGATIVE
C Diff toxin: NEGATIVE

## 2015-08-19 LAB — GLUCOSE, CAPILLARY: GLUCOSE-CAPILLARY: 101 mg/dL — AB (ref 65–99)

## 2015-08-19 LAB — PROTIME-INR
INR: 1.17 (ref 0.00–1.49)
Prothrombin Time: 15.1 seconds (ref 11.6–15.2)

## 2015-08-19 LAB — HEPARIN LEVEL (UNFRACTIONATED): HEPARIN UNFRACTIONATED: 0.57 [IU]/mL (ref 0.30–0.70)

## 2015-08-19 MED ORDER — WARFARIN SODIUM 5 MG PO TABS
5.0000 mg | ORAL_TABLET | Freq: Once | ORAL | Status: AC
  Administered 2015-08-19: 5 mg via ORAL
  Filled 2015-08-19 (×2): qty 1

## 2015-08-19 NOTE — Progress Notes (Signed)
Physical Therapy Treatment Patient Details Name: Carla [name] MRN: [id number] DOB: 1922/05/31 Today's Date: 08/19/2015    History of Present Illness Patient is a 82 yo female admitted 08/15/15 with SOB and weakness.  Patient with pulmonary embolism, RLE DVT, and now s/p IVC filter placement.   PMH:  Afib, osteoporosis, scoliosis    PT Comments    Pt remains to desaturate on RA with activty/ambulation.  Pt required decreased assist with transfer but required min guard with gait training as pt fatigues easily and tends to rush when fatigue requiring cues for safety.  Pt reports she has a friend that can help part time but is unable to have 24 hour care arranged.    Follow Up Recommendations  Home health PT;Supervision/Assistance - 24 hour (if 24/7 care not provided will require SNF.)     Equipment Recommendations  None recommended by PT    Recommendations for Other Services       Precautions / Restrictions Precautions Precautions: Fall Restrictions Weight Bearing Restrictions: No    Mobility  Bed Mobility Overal bed mobility:  (Pt recieved in recliner chair.  )                Transfers Overall transfer level: Needs assistance Equipment used: Rolling walker (2 wheeled) Transfers: Sit to/from Stand Sit to Stand: Supervision         General transfer comment: Verbal cues for hand placement.    Ambulation/Gait Ambulation/Gait assistance: Min guard Ambulation Distance (Feet): 220 Feet Assistive device: Rolling walker (2 wheeled) Gait Pattern/deviations: Step-through pattern;Decreased stride length;Trunk flexed Gait velocity: increased cadence with poor trunk control.  Demonstrated forward flexion despite cues to correct.   Gait velocity interpretation: at or above normal speed for age/gender General Gait Details: Performed ambulation on RA for qualification of O2 at home.  Pt desats to 88% on RA and required 2L to improve to 93% and maintain.     Stairs             Wheelchair Mobility    Modified Rankin (Stroke Patients Only)       Balance           Standing balance support: Bilateral upper extremity supported Standing balance-Leahy Scale: Fair Standing balance comment: reliance of RW                    Cognition Arousal/Alertness: Awake/alert Behavior During Therapy: WFL for tasks assessed/performed;Impulsive Overall Cognitive Status: Within Functional Limits for tasks assessed                      Exercises      General Comments        Pertinent Vitals/Pain Pain Assessment: No/denies pain    Home Living                      Prior Function            PT Goals (current goals can now be found in the care plan section) Acute Rehab PT Goals Potential to Achieve Goals: Good Progress towards PT goals: Progressing toward goals    Frequency  Min 3X/week    PT Plan Current plan remains appropriate    Co-evaluation             End of Session Equipment Utilized During Treatment: Gait belt;Oxygen Activity Tolerance: Patient tolerated treatment well Patient left: in chair;with call bell/phone within reach;with chair alarm set     Time:  4098-1191 PT Time Calculation (min) (ACUTE ONLY): 25 min  Charges:  $Gait Training: 8-22 mins $Therapeutic Activity: 8-22 mins                    G Codes:      Florestine Avers 08/20/2015, 4:31 PM Joycelyn Rua, PTA pager [phone]

## 2015-08-19 NOTE — Progress Notes (Signed)
ANTICOAGULATION CONSULT NOTE - Follow Up Consult  Pharmacy Consult for Heparin; add Coumadin Indication: PE/DVT  Allergies  Allergen Reactions  . Bactrim [Sulfamethoxazole-Trimethoprim] Other (See Comments)    Has chronic kidney disease.  This is not recommended by nephrologist.    . Nsaids Other (See Comments)    Has chronic kidney disease.  This is not recommended by nephrologist.    Patient Measurements: Height: 5' 2.5" (158.8 cm) Weight: 168 lb 10.4 oz (76.5 kg) IBW/kg (Calculated) : 51.25 Heparin Dosing Weight: 67.6kg  Vital Signs: Temp: 97.7 F (36.5 C) (02/14 0552) Temp Source: Oral (02/14 0552) BP: 152/66 mmHg (02/14 0552) Pulse Rate: 79 (02/14 0552)  Labs:  Recent Labs  08/16/15 1206 08/17/15 0530 08/17/15 0900 08/18/15 0231  HGB  --  12.1  --  11.9*  HCT  --  39.4  --  39.1  PLT  --  191  --  198  LABPROT  --   --  15.1  --   INR  --   --  1.17  --   HEPARINUNFRC 0.73* 0.50  --  0.57  CREATININE  --  1.08*  --  1.49*    Estimated Creatinine Clearance: 23.8 mL/min (by C-G formula based on Cr of 1.49).   Medications:  Heparin @ 1100 units/hr  Assessment: 89yof continues on heparin+warfarin for PE/DVT. Heparin level is therapeutic at 0.57. CBC stable. No bleeding reported. Patient started coumadin 2/13. Baseline INR 1.17. Coumadin score = 3. Will need to watch for drug interaction with amiodarone. INR remains 1.17 today. Hg low stable, plt wnl. No bleed documented.  She will need at least 5 days of overlap therapy with heparin and coumadin. Today will be day #2.  2/10 CT angio: submassive PE with mild right heart strain 2/11 Bilateral LE dopplers revealed multiple thrombi within the right common femoral artery  2/11 IVC filter placed  Goal of Therapy:  Heparin level 0.3-0.7 units/ml Monitor platelets by anticoagulation protocol: Yes   Plan:  Continue heparin at 1100 units/h Coumadin  x 1 Heparin level/INR/CBC in AM  Babs Bertin, PharmD,  [facility] Clinical Pharmacist Pager [phone] 08/19/2015 8:35 AM

## 2015-08-19 NOTE — Progress Notes (Signed)
Patient Demographics:    Carla [name], is a 82 y.o. female, DOB - Oct 22, 1921, ZOX:[id number]  Admit date - 08/15/2015   Admitting Physician Bobette Mo, MD  Outpatient Primary MD for the patient is Carla Inch, MD  LOS - 4   Summary  Pleasant 82 year old occasional female who still lives at home and fairly independent with history of chronic atrial fibrillation, anemia of chronic disease, osteoporosis, dyslipidemia, diverticulosis, GI bleed in the past who came to the hospital with chief complaints of shortness of breath, in the ER she was found to have a submassive PE with some evidence of right heart strain on CT scan, she also had a large mobile clot in the right lower extremity. She was seen by pulmonary critical care. She is currently been placed on heparin drip with Coumadin. Per pulmonary continue the bridge with these medications until INR is therapeutic for 2 days. Per pulmonary do not place on newer anticoagulants or Lovenox.   Chief Complaint  Patient presents with  . Shortness of Breath        Subjective:    [name] today has, No headache, No chest pain, No abdominal pain - No Nausea, No new weakness tingling or numbness, No Cough - SOB.  Feels better no subjective complaints.   Assessment  & Plan :     1. Acute submassive PE with mild right heart strain. Stable in stepdown, currently completely symptom free, is needing 2 L nasal cannula oxygen and will qualify for home o2, continue heparin drip with coumadin D/W Dr [name] PCCM , Stable echogram , Vas US shows large R Leg clot therefore got an IVC filter placed, potentially to be removed in 3 months by Dr. Simonne Come. Per pulmonary patient to be continued on heparin and Coumadin only until she is therapeutic for 2  days.   Per pulmonary No newer agents or Lovenox. She has no follow risk, has not fallen at home, good balance, discussed risks and benefits of long-term anticoagulation with patient and her daughter. Monitor for any GI bleed.   2. Chronic atrial fibrillation [place] vasc 2 score of 4 or above. Continue amiodarone for rate control, continue heparin and transitioned to Coumadin.    3. Right lower extremity swelling. Large DVT Rx as in #1.  4. Hypothyroidism. Continue home dose Synthroid.  5. Mildly elevated troponin. No chest pain currently, EKG nonacute, troponin rise is in non-ACS pattern likely demand ischemia from PE, supportive care. Check echogram to evaluate wall motion and EF.  6. CKD 4 - at baseline of creat 1.4.    Code Status : Full  Family Communication  : Son and son in law 08-16-15, daughter on 08-18-15  Disposition Plan  : Likely home with home health. Fairly active and mobile.  Consults  :  PCCM, IR  Procedures  :   CTA chest - submassive PE with mild right heart strain   Lower extremity venous duplex - Large R leg DVT  TTE -  Left ventricle: The cavity size was normal. There was mild focalbasal hypertrophy of the septum. Systolic function was normal. The estimated ejection fraction was in the range of 50% to 55%. Wall motion was normal; there were no regional wall motion abnormalities.  Doppler parameters are consistent with abnormalleft ventricular relaxation (grade 1 diastolic dysfunction). - Aortic valve: There was trivial regurgitation. - Mitral valve: Calcified annulus. There was mild regurgitation.  Pulmonary arteries: Systolic pressure was mildly increased. PA peak pressure: 34 mm Hg (S).  Impressions:  Normal LV systolic function; grade 1 diastolic dysfunction; traceAI; mild MR and TR; mildly elevated pulmonary pressure.   IVC filter - 08-16-15 Dr [name]    DVT Prophylaxis  :  Heparin/Coumadin    Lab Results  Component Value Date   PLT 211 08/19/2015      Inpatient Medications  Scheduled Meds: . acetaminophen  650 mg Oral BID  . amiodarone  100 mg Oral Daily  . calcium-vitamin D  1 tablet Oral BID  . docusate sodium  100 mg Oral BID  . levothyroxine  75 mcg Oral QAC breakfast  . pantoprazole  40 mg Oral Daily  . warfarin  5 mg Oral ONCE-1800  . Warfarin - Pharmacist Dosing Inpatient   Does not apply q1800   Continuous Infusions: . sodium chloride 10 mL/hr at 08/18/15 1800  . heparin 1,100 Units/hr (08/19/15 1038)   PRN Meds:.[DISCONTINUED] ondansetron **OR** ondansetron (ZOFRAN) IV  Antibiotics  :     Anti-infectives    None        Objective:   Filed Vitals:   08/18/15 1800 08/18/15 1925 08/18/15 2128 08/19/15 0552  BP:  151/69 159/71 152/66  Pulse: 72  83 79  Temp:  97.8 F (36.6 C) 97.6 F (36.4 C) 97.7 F (36.5 C)  TempSrc:  Oral Oral Oral  Resp: 22 23 18 16   Height:      Weight:      SpO2: 93% 97% 95% 90%    Wt Readings from Last 3 Encounters:  08/16/15 76.5 kg (168 lb 10.4 oz)  02/27/15 77.066 kg (169 lb 14.4 oz)  08/07/14 75.978 kg (167 lb 8 oz)     Intake/Output Summary (Last 24 hours) at 08/19/15 1131 Last data filed at 08/18/15 2000  Gross per 24 hour  Intake    409 ml  Output      0 ml  Net    409 ml     Physical Exam  Awake Alert, Oriented X 3, No new F.N deficits, Normal affect [place].AT,PERRAL Supple Neck,No JVD, No cervical lymphadenopathy appriciated.  Symmetrical Chest wall movement, Good air movement bilaterally, CTAB RRR,No Gallops,Rubs or new Murmurs, No Parasternal Heave +ve B.Sounds, Abd Soft, No tenderness, No organomegaly appriciated, No rebound - guarding or rigidity. No Cyanosis, Clubbing or edema, No new Rash or bruise, R leg swollen    Data Review:   Micro Results Recent Results (from the past 240 hour(s))  MRSA PCR Screening     Status: None   Collection Time: 08/15/15 11:58 PM  Result Value Ref Range Status   MRSA by PCR NEGATIVE NEGATIVE Final    Comment:         The GeneXpert MRSA Assay (FDA approved for NASAL specimens only), is one component of a comprehensive MRSA colonization surveillance program. It is not intended to diagnose MRSA infection nor to guide or monitor treatment for MRSA infections.     Radiology Reports Dg Chest 2 View  08/15/2015  CLINICAL DATA:  Acute shortness of breath since waking up this morning. EXAM: CHEST  2 VIEW COMPARISON:  01/08/2013 FINDINGS: The cardiomediastinal silhouette is unchanged, with a large hiatal hernia again seen. There remains elevation of the right hemidiaphragm. Chronic diffuse interstitial coarsening and  more focal scarring or atelectasis in the left lung base do not appear significantly changed. There is no evidence of acute airspace consolidation, overt edema, pleural effusion, or pneumothorax. Diffuse aortic calcification is noted. No acute osseous abnormality is seen. IMPRESSION: Stable appearance of the chest without evidence of active cardiopulmonary disease. Electronically Signed   By: Sebastian Ache M.D.   On: 08/15/2015 15:33   Ct Angio Chest Pe W/cm &/or Wo Cm  08/15/2015  CLINICAL DATA:  Shortness of breath and chest pain for 1 week EXAM: CT ANGIOGRAPHY CHEST WITH CONTRAST TECHNIQUE: Multidetector CT imaging of the chest was performed using the standard protocol during bolus administration of intravenous contrast. Multiplanar CT image reconstructions and MIPs were obtained to evaluate the vascular anatomy. CONTRAST:  OMNIPAQUE IOHEXOL 350 MG/ML SOLN COMPARISON:  Chest CT August 21, 2012; chest radiograph August 15, 2015 FINDINGS: There is extensive pulmonary embolism with pulmonary emboli arising from each main pulmonary artery and extending into upper and proximal lower lobe pulmonary arteries bilaterally. There is more pulmonary embolus on the right than on the left with extensive pulmonary embolus extending into several right lower lobe pulmonary branches as well as into the right  middle and right upper lobe pulmonary branches. There is right heart strain with the right ventricle to left ventricle diameter ratio of 1.5, normal less than 0.9. There is no thoracic aortic aneurysm or dissection. There is atherosclerotic change throughout much of the aorta. The visualized great vessels appear normal except for moderate atherosclerotic calcification at the origin of the left subclavian artery. There is left ventricular hypertrophy. The pericardium is not thickened. There are foci of coronary artery calcification. There is a small calcified granuloma in the lateral left base. There is a focal area of ground-glass opacity in the periphery of the anterior segment of the left upper lobe measuring 0.8 x 0.7 cm. There is similar-appearing ground-glass type opacity in the anterior left apex measuring 1.2 x 0.8 cm. Thyroid appears normal. There is no appreciable thoracic adenopathy. There is a sizable hiatal type hernia with most of the stomach above the diaphragm. In the visualized upper abdomen, there is atherosclerotic calcification in the aorta. Gallbladder is absent. Incomplete visualization of the pancreas suggests that there is moderate fatty infiltration in the pancreas. There is contrast in portions of the inferior vena cava and hepatic arteries. There is degenerative change in the thoracic spine with increase in kyphosis. There are no blastic or lytic bone lesions. Review of the MIP images confirms the above findings. IMPRESSION: Positive for acute PE with CT evidence of right heart strain (RV/LV Ratio = 1.5) consistent with at least submassive (intermediate risk) PE. The presence of right heart strain has been associated with an increased risk of morbidity and mortality. Please activate Code PE by paging [phone]. Sizable hiatal hernia with most of the stomach above the diaphragm. Ground-glass opacities on the left, largest measuring 1.2 x 0.8 cm. Followup noncontrast enhanced CT in 3  months advised to assess for stability. It should be noted that atypical neoplasm could present in this manner. There are foci of coronary artery calcification. There is left ventricular hypertrophy. No demonstrable adenopathy. Critical Value/emergent results were called by telephone at the time of interpretation on 08/15/2015 at 5:19 pm to [facility], who verbally acknowledged these results. Electronically Signed   By: Bretta Bang III M.D.   On: 08/15/2015 17:22   Ir Ivc Filter Plmt / S&i /img Guid/mod Sed  08/16/2015  INDICATION:  History of pulmonary embolism and right lower extremity DVT. Patient with history of atrial fibrillation, however is not a candidate for anticoagulation secondary to multiple gynecological issues with associated anti coagulation induced bleeding. As such, request made for placement of an IVC filter for caval interruption purposes. EXAM: ULTRASOUND GUIDANCE FOR VASCULAR ACCESS IVC CATHETERIZATION AND VENOGRAM IVC FILTER INSERTION COMPARISON:  Chest CT - 08/15/2015; CT abdomen and pelvis - 01/08/2013 MEDICATIONS: None. ANESTHESIA/SEDATION: Moderate (conscious) sedation was employed during this procedure. A total of Versed 1 mg and Fentanyl 50 mcg was administered intravenously. Moderate Sedation Time: 10 minutes. The patient's level of consciousness and vital signs were monitored continuously by radiology nursing throughout the procedure under my direct supervision. CONTRAST:  None, CO2 was utilized for this examination is secondary to patient's renal insufficiency. FLUOROSCOPY TIME:  1 minutes 24 seconds (55 mGy) COMPLICATIONS: None immediate PROCEDURE: Informed consent was obtained from the patient following explanation of the procedure, risks, benefits and alternatives. The patient understands, agrees and consents for the procedure. All questions were addressed. A time out was performed prior to the initiation of the procedure. Maximal barrier sterile technique utilized  including caps, mask, sterile gowns, sterile gloves, large sterile drape, hand hygiene, and Betadine prep. Under sterile condition and local anesthesia, right internal jugular venous access was performed with ultrasound. An ultrasound image was saved and sent to PACS. Over a guidewire, the IVC filter delivery sheath and inner dilator were advanced into the IVC just above the IVC bifurcation. Contrast injection was performed for an IVC venogram. Through the delivery sheath, a retrievable Denali IVC filter was deployed below the level of the renal veins and above the IVC bifurcation. Limited post deployment venacavagram was performed. The delivery sheath was removed and hemostasis was obtained with manual compression. A dressing was placed. The patient tolerated the procedure well without immediate post procedural complication. FINDINGS: The IVC is patent. No evidence of thrombus, stenosis, or occlusion. No variant venous anatomy. Successful placement of the IVC filter below the level of the renal veins. IMPRESSION: Successful ultrasound and fluoroscopically guided placement of an infrarenal retrievable IVC filter via right jugular approach. Due to patient related comorbidities and/or clinical necessity, this IVC filter should be considered a permanent device. This patient will not be actively followed for future filter retrieval. Electronically Signed   By: Simonne Come M.D.   On: 08/16/2015 19:40     CBC  Recent Labs Lab 08/15/15 1545 08/15/15 1554 08/16/15 0526 08/17/15 0530 08/18/15 0231 08/19/15 0825  WBC 10.8*  --  10.6* 11.1* 10.5 8.8  HGB 13.6 16.3* 11.9* 12.1 11.9* 11.6*  HCT 42.8 48.0* 37.4 39.4 39.1 38.2  PLT 204  --  170 191 198 211  MCV 87.7  --  87.2 87.9 88.7 88.8  MCH 27.9  --  27.7 27.0 27.0 27.0  MCHC 31.8  --  31.8 30.7 30.4 30.4  RDW 18.3*  --  18.5* 18.7* 18.9* 19.1*  LYMPHSABS 0.9  --  2.0 1.6  --   --   MONOABS 1.2*  --  1.4* 1.5*  --   --   EOSABS 0.0  --  0.1 0.2  --   --    BASOSABS 0.0  --  0.1 0.1  --   --     Chemistries   Recent Labs Lab 08/15/15 1554 08/16/15 0007 08/16/15 0526 08/17/15 0530 08/18/15 0231  NA 142 143 144 143 142  K 3.9 3.5 3.8 3.2* 5.1  CL 103 107  105 107 106  CO2  --  GLUCOSE 128* 146* 115* 118* 111*  BUN CREATININE 1.10* 1.24* 1.07* 1.08* 1.49*  CALCIUM  --  8.3* 8.3* 8.2* 8.8*  MG  --  1.7  --   --  2.1  AST  --   --  19 19  --   ALT  --   --  9* 10*  --   ALKPHOS  --   --  59 56  --   BILITOT  --   --  0.3 0.4  --    ------------------------------------------------------------------------------------------------------------------ No results for input(s): CHOL, HDL, LDLCALC, TRIG, CHOLHDL, LDLDIRECT in the last 72 hours.  Lab Results  Component Value Date   HGBA1C 6.1* 01/08/2013   ------------------------------------------------------------------------------------------------------------------ No results for input(s): TSH, T4TOTAL, T3FREE, THYROIDAB in the last 72 hours.  Invalid input(s): FREET3 ------------------------------------------------------------------------------------------------------------------ No results for input(s): VITAMINB12, FOLATE, FERRITIN, TIBC, IRON, RETICCTPCT in the last 72 hours.  Coagulation profile  Recent Labs Lab 08/17/15 0900 08/19/15 0825  INR 1.17 1.17    No results for input(s): DDIMER in the last 72 hours.  Cardiac Enzymes  Recent Labs Lab 08/16/15 0007 08/16/15 0526  TROPONINI 0.82* 0.62*   ------------------------------------------------------------------------------------------------------------------    Component Value Date/Time   BNP 295.4* 08/15/2015 1545    Time Spent in minutes   35   Khrystina Bonnes K M.D on 08/19/2015 at 11:31 AM  Between 7am to 7pm - Pager - [phone]  After 7pm go to [email or web address] - password [facility]  [facility] -  Office  [phone]

## 2015-08-19 NOTE — Therapy (Signed)
SATURATION QUALIFICATIONS: (This note is used to comply with regulatory documentation for home oxygen)  Patient Saturations on Room Air at Rest = 92%  Patient Saturations on Room Air while Ambulating = 88%  Patient Saturations on 2 Liters of oxygen while Ambulating = 93%  Please briefly explain why patient needs home oxygen: Desaturation with ambulation and activity.

## 2015-08-19 NOTE — Progress Notes (Signed)
NURSING PROGRESS NOTE  COURTENY EGLER [id number] Transfer Data: 08/19/2015 2:15 AM Attending Provider: Leroy Sea, MD BMW:[id number] C, MD Code Status: Full  YAN OKRAY is a 82 y.o. female patient transferred from 2H -No acute distress noted.  -No complaints of shortness of breath.  -No complaints of chest pain.   Cardiac Monitoring: Box #19 in place. Cardiac monitor yields:normal sinus rhythm.  Blood pressure 159/71, pulse 83, temperature 97.6 F (36.4 C), temperature source Oral, resp. rate 18, height 5' 2.5" (1.588 m), weight 76.5 kg (168 lb 10.4 oz), SpO2 95 %.   IV Fluids:  IV in place, occlusive dsg intact without redness, IV cath forearm left, condition patent and no redness normal saline and heparin gtt.   Allergies:  Bactrim and Nsaids  Past Medical History:   has a past medical history of Iron deficiency anemia; High cholesterol; Osteoarthritis; Female bladder prolapse; Osteoporosis; Cataracts, bilateral; Gallstones; Hemorrhoids; Diverticulosis; Hiatal hernia; Atrial fibrillation (HCC); Cystocele; Rectocele; Scoliosis; Lordosis; Hyperlipidemia; Osteoarthritis; Dysrhythmia; Pneumonia; Hypothyroidism; Chronic kidney disease; Encounter for fitting and adjustment of pessary (04/23/2002); and Renal insufficiency.  Past Surgical History:   has past surgical history that includes Cholecystectomy; Cataract extraction; Esophagogastroduodenoscopy; Colonoscopy; Endometrial biopsy (09/2009); Esophagogastroduodenoscopy (N/A, 01/09/2013); and Rectal ultrasound (N/A, 04/10/2014).  Social History:   reports that she has never smoked. She has never used smokeless tobacco. She reports that she does not drink alcohol or use illicit drugs.  Skin: dry, bruises noted on bilateral arm and stage 1 pressure ulcer noted on sacrum   Patient/Family orientated to room. Information packet given to patient/family. Admission inpatient armband information verified with patient/family to  include name and date of birth and placed on patient arm. Side rails up x 2, fall assessment and education completed with patient/family. Patient/family able to verbalize understanding of risk associated with falls and verbalized understanding to call for assistance before getting out of bed. Call light within reach. Patient/family able to voice and demonstrate understanding of unit orientation instructions.    Will continue to evaluate and treat per MD orders.

## 2015-08-19 NOTE — Progress Notes (Signed)
SATURATION QUALIFICATIONS: (This note is used to comply with regulatory documentation for home oxygen)  Patient Saturations on 2L at Rest = 92%  Patient Saturations on room air while Ambulating = 88-92%  Patient Saturations on 2 Liters of oxygen while Ambulating = 92%  Please briefly explain why patient needs home oxygen:

## 2015-08-20 DIAGNOSIS — I482 Chronic atrial fibrillation: Secondary | ICD-10-CM

## 2015-08-20 DIAGNOSIS — R799 Abnormal finding of blood chemistry, unspecified: Secondary | ICD-10-CM

## 2015-08-20 LAB — CBC
HEMATOCRIT: 37.3 % (ref 36.0–46.0)
Hemoglobin: 11.2 g/dL — ABNORMAL LOW (ref 12.0–15.0)
MCH: 26.7 pg (ref 26.0–34.0)
MCHC: 30 g/dL (ref 30.0–36.0)
MCV: 88.8 fL (ref 78.0–100.0)
Platelets: 197 10*3/uL (ref 150–400)
RBC: 4.2 MIL/uL (ref 3.87–5.11)
RDW: 19 % — AB (ref 11.5–15.5)
WBC: 9.9 10*3/uL (ref 4.0–10.5)

## 2015-08-20 LAB — PROTIME-INR
INR: 1.75 — ABNORMAL HIGH (ref 0.00–1.49)
Prothrombin Time: 20.4 seconds — ABNORMAL HIGH (ref 11.6–15.2)

## 2015-08-20 LAB — GLUCOSE, CAPILLARY: Glucose-Capillary: 108 mg/dL — ABNORMAL HIGH (ref 65–99)

## 2015-08-20 LAB — HEPARIN LEVEL (UNFRACTIONATED): Heparin Unfractionated: 0.58 IU/mL (ref 0.30–0.70)

## 2015-08-20 MED ORDER — WARFARIN SODIUM 2.5 MG PO TABS
2.5000 mg | ORAL_TABLET | Freq: Once | ORAL | Status: AC
  Administered 2015-08-20: 2.5 mg via ORAL
  Filled 2015-08-20: qty 1

## 2015-08-20 MED ORDER — NITROFURANTOIN MACROCRYSTAL 50 MG PO CAPS: 50.0000 mg | ORAL_CAPSULE | Freq: Every day | ORAL | Status: DC

## 2015-08-20 NOTE — Progress Notes (Signed)
ANTICOAGULATION CONSULT NOTE - Follow Up Consult  Pharmacy Consult for Heparin; add Coumadin Indication: PE/DVT  Allergies  Allergen Reactions  . Bactrim [Sulfamethoxazole-Trimethoprim] Other (See Comments)    Has chronic kidney disease.  This is not recommended by nephrologist.    . Nsaids Other (See Comments)    Has chronic kidney disease.  This is not recommended by nephrologist.    Patient Measurements: Height: 5' 2.5" (158.8 cm) Weight: 168 lb 10.4 oz (76.5 kg) IBW/kg (Calculated) : 51.25 Heparin Dosing Weight: 67.6kg  Vital Signs: Temp: 97.9 F (36.6 C) (02/15 0659) Temp Source: Oral (02/15 0659) BP: 128/56 mmHg (02/15 0659) Pulse Rate: 75 (02/15 0659)  Labs:  Recent Labs  08/17/15 0900 08/18/15 0231 08/19/15 0825  HGB  --  11.9* 11.6*  HCT  --  39.1 38.2  PLT  --  198 211  LABPROT 15.1  --  15.1  INR 1.17  --  1.17  HEPARINUNFRC  --  0.57 0.57  CREATININE  --  1.49*  --     Estimated Creatinine Clearance: 23.8 mL/min (by C-G formula based on Cr of 1.49).   Medications:  Heparin @ 1100 units/hr  Assessment: 89yof continues on heparin+warfarin for PE/DVT. Heparin level is therapeutic at 0.57. CBC stable. No bleeding reported. Patient started coumadin 2/13. Baseline INR 1.17. Coumadin score = 3. Will need to watch for drug interaction with amiodarone. INR increased 1.17>>1.75 this AM. Hg low stable, plt wnl. No bleed documented.  She will need at least 5 days of overlap therapy with heparin and coumadin. Today will be day #3.  2/10 CT angio: submassive PE with mild right heart strain 2/11 Bilateral LE dopplers revealed multiple thrombi within the right common femoral artery  2/11 IVC filter placed  Goal of Therapy:  Heparin level 0.3-0.7 units/ml Monitor platelets by anticoagulation protocol: Yes   Plan:  Continue heparin at 1100 units/h Coumadin 2.5mg  x 1 Heparin level/INR/CBC in AM  Babs Bertin, PharmD, [facility] Clinical Pharmacist Pager  [phone] 08/20/2015 8:39 AM

## 2015-08-20 NOTE — Progress Notes (Signed)
[facility] PROGRESS NOTE    Progress Note   Carla [name]:[id number] DOB: [birth year] DOA: 08/15/2015 PCP: Carla Inch, MD   Brief Narrative:   Carla [name] is an 82 y.o. female lives at home fairly independent with a history of chronic A. Fib not on Coumadin anemia of chronic disease history of GI bleed in the past comes into the hospital complaining of shortness of breath, CT in June of the chest was done and was found to have a submassive PE with right heart strain.  Assessment/Plan:   Acute respiratory failure with hypoxia due to Acute submassive Pulmonary embolism (HCC)/  Acute pulmonary embolism (HCC) with rt heart strin on CT Continue nasal cannula oxygen. She is on heparin drip, was discussed with PCCM,lower extremity Doppler that showed a large lower extremity clot, so IVC filter. Continue IV heparin and Coumadin for 2 additional days until she is therapeutic. She is on Macrobid and amiodarone, we'll resume both to see her she reacts to these agents. Saturations dropped to 88% with ambulation on room air. Require home oxygen.  Chronic Atrial fibrillation (HCC): [place] vasc 2 score of 4. She is rate control continue amiodarone and heparin.  Right lower chest remedy swelling due to large DVT: As per #1.   Adult hypothyroidism: Continue Synthroid   Elevated troponin level Mildly elevated troponins in the setting of submassive PE unlikely ACS pattern. 2-D echo showed no wall motion abnormality.      DVT Prophylaxis Heparin and Coumadin  Family Communication: none Disposition Plan: Home 2 days Code Status:     Code Status Orders        Start     Ordered   08/15/15 2305  Full code   Continuous     08/15/15 2305    Code Status History    Date Active Date Inactive Code Status Order ID Comments User Context   01/08/2013 12:45 PM 01/10/2013  6:19 PM Full Code [id number]  Carla Bun Rama, MD Inpatient   08/19/2012  3:14 PM 08/27/2012  8:53 PM  Full Code [id number]  Carla Fickle, MD Inpatient    Advance Directive Documentation        Most Recent Value   Type of Advance Directive  Living will   Pre-existing out of facility DNR order (yellow form or pink MOST form)     "MOST" Form in Place?          IV Access:    Peripheral IV   Procedures and diagnostic studies:   No results found.   Medical Consultants:    None.  Anti-Infectives:   Anti-infectives    None      Subjective:    Carla [name] she relates she feels winded especially with ambulation.  Objective:    Filed Vitals:   08/19/15 1635 08/19/15 2323 08/20/15 0659 08/20/15 0938  BP: 129/69 157/71 128/56   Pulse: 82 81 75 112  Temp: 97.6 F (36.4 C) 98.2 F (36.8 C) 97.9 F (36.6 C)   TempSrc: Oral Oral Oral   Resp: Height:      Weight:      SpO2: 95% 97% 95% 93%    Intake/Output Summary (Last 24 hours) at 08/20/15 1137 Last data filed at 08/19/15 1900  Gross per 24 hour  Intake    360 ml  Output      0 ml  Net    360 ml   Filed Weights   08/15/15 1445  08/16/15 0350  Weight: 79.379 kg (175 lb) 76.5 kg (168 lb 10.4 oz)    Exam: Gen:  NAD Cardiovascular:  RRR. Chest and lungs:   Good air movement clear to auscultation. Abdomen:  Abdomen soft, NT/ND, + BS Extremities:  No edema   Data Reviewed:    Labs: Basic Metabolic Panel:  Recent Labs Lab 08/15/15 1554 08/16/15 0007 08/16/15 0526 08/17/15 0530 08/18/15 0231  NA 142 143 144 143 142  K 3.9 3.5 3.8 3.2* 5.1  CL 103 107 105 107 106  CO2  --  GLUCOSE 128* 146* 115* 118* 111*  BUN CREATININE 1.10* 1.24* 1.07* 1.08* 1.49*  CALCIUM  --  8.3* 8.3* 8.2* 8.8*  MG  --  1.7  --   --  2.1  PHOS  --  3.0  --   --   --    GFR Estimated Creatinine Clearance: 23.8 mL/min (by C-G formula based on Cr of 1.49). Liver Function Tests:  Recent Labs Lab 08/16/15 0526 08/17/15 0530  AST 19 19  ALT 9* 10*  ALKPHOS 59 56    BILITOT 0.3 0.4  PROT 5.7* 5.7*  ALBUMIN 2.5* 2.4*   No results for input(s): LIPASE, AMYLASE in the last 168 hours. No results for input(s): AMMONIA in the last 168 hours. Coagulation profile  Recent Labs Lab 08/17/15 0900 08/19/15 0825 08/20/15 0905  INR 1.17 1.17 1.75*    CBC:  Recent Labs Lab 08/15/15 1545  08/16/15 0526 08/17/15 0530 08/18/15 0231 08/19/15 0825 08/20/15 0905  WBC 10.8*  --  10.6* 11.1* 10.5 8.8 9.9  NEUTROABS 8.6*  --  7.2 7.8*  --   --   --   HGB 13.6  < > 11.9* 12.1 11.9* 11.6* 11.2*  HCT 42.8  < > 37.4 39.4 39.1 38.2 37.3  MCV 87.7  --  87.2 87.9 88.7 88.8 88.8  PLT 204  --  170 191 198 211 197  < > = values in this interval not displayed. Cardiac Enzymes:  Recent Labs Lab 08/16/15 0007 08/16/15 0526  TROPONINI 0.82* 0.62*   BNP (last 3 results) No results for input(s): PROBNP in the last 8760 hours. CBG:  Recent Labs Lab 08/16/15 0806 08/17/15 0754 08/18/15 2131 08/19/15 0804 08/20/15 0737  GLUCAP 91 92 121* 101* 108*   D-Dimer: No results for input(s): DDIMER in the last 72 hours. Hgb A1c: No results for input(s): HGBA1C in the last 72 hours. Lipid Profile: No results for input(s): CHOL, HDL, LDLCALC, TRIG, CHOLHDL, LDLDIRECT in the last 72 hours. Thyroid function studies: No results for input(s): TSH, T4TOTAL, T3FREE, THYROIDAB in the last 72 hours.  Invalid input(s): FREET3 Anemia work up: No results for input(s): VITAMINB12, FOLATE, FERRITIN, TIBC, IRON, RETICCTPCT in the last 72 hours. Sepsis Labs:  Recent Labs Lab 08/17/15 0530 08/18/15 0231 08/19/15 0825 08/20/15 0905  WBC 11.1* 10.5 8.8 9.9   Microbiology Recent Results (from the past 240 hour(s))  MRSA PCR Screening     Status: None   Collection Time: 08/15/15 11:58 PM  Result Value Ref Range Status   MRSA by PCR NEGATIVE NEGATIVE Final    Comment:        The GeneXpert MRSA Assay (FDA approved for NASAL specimens only), is one component of  a comprehensive MRSA colonization surveillance program. It is not intended to diagnose MRSA infection nor to guide or monitor treatment for MRSA infections.   C  difficile quick scan w PCR reflex     Status: None   Collection Time: 08/19/15  4:31 PM  Result Value Ref Range Status   C Diff antigen NEGATIVE NEGATIVE Final   C Diff toxin NEGATIVE NEGATIVE Final   C Diff interpretation Negative for toxigenic C. difficile  Final     Medications:   . acetaminophen  650 mg Oral BID  . amiodarone  100 mg Oral Daily  . calcium-vitamin D  1 tablet Oral BID  . docusate sodium  100 mg Oral BID  . levothyroxine  75 mcg Oral QAC breakfast  . pantoprazole  40 mg Oral Daily  . warfarin  2.5 mg Oral ONCE-1800  . Warfarin - Pharmacist Dosing Inpatient   Does not apply q1800   Continuous Infusions: . sodium chloride 10 mL/hr at 08/18/15 1800  . heparin 1,100 Units/hr (08/19/15 1038)    Time spent: 25 min   LOS: 5 days   Marinda Elk  [facility] Pager [phone]  *Please refer to [email or web address], password [email or web address] to get updated schedule on who will round on this patient, as hospitalists switch teams weekly. If 7PM-7AM, please contact night-coverage at [email or web address], password [email or web address] for any overnight needs.  08/20/2015, 11:37 AM

## 2015-08-20 NOTE — Progress Notes (Addendum)
Physical Therapy Treatment Patient Details Name: Carla [name] MRN: [id number] DOB: 28-Oct-1921 Today's Date: 08/20/2015    History of Present Illness Patient is a 82 yo female admitted 08/15/15 with SOB and weakness.  Patient with pulmonary embolism, RLE DVT, and now s/p IVC filter placement.   PMH:  Afib, osteoporosis, scoliosis    PT Comments    Pt performed increased gait training and required cues for erect posture and RW safety.  Pt performed stair training with RW and required mod assist to negotiate.  Pt remains adamant to refuse placement.  Pt may need ambulance transport home to negotiate stair safely at d/c.  Pt remains to report that she does not have 24 hour assist but does have a friend that can stay with her part time.    Follow Up Recommendations  Supervision - Intermittent;Home health PT (if 24/7 care cannot be provided  pt will require SNF placement.  )     Equipment Recommendations  None recommended by PT    Recommendations for Other Services       Precautions / Restrictions Precautions Precautions: Fall Restrictions Weight Bearing Restrictions: No    Mobility  Bed Mobility Overal bed mobility:  (Pt recieved in recliner chair.  )                Transfers Overall transfer level: Needs assistance Equipment used: Rolling walker (2 wheeled) Transfers: Sit to/from Stand Sit to Stand: Supervision;Min guard         General transfer comment: Verbal cues for hand placement.  demonstrates LOB posterior but able to correct.    Ambulation/Gait Ambulation/Gait assistance: Min guard Ambulation Distance (Feet): 250 Feet Assistive device: Rolling walker (2 wheeled) Gait Pattern/deviations: Step-through pattern;Decreased stride length;Trunk flexed Gait velocity: increased cadence with poor trunk control.  Demonstrated forward flexion despite cues to correct.     General Gait Details: Pt performed increased gait distance with cues for sequencing and RW  placement to maintain safety.  Pt required constant cues for erect posture.     Stairs Stairs: Yes Stairs assistance: Mod assist Stair Management: Backwards;With walker Number of Stairs: 2 General stair comments: Pt required increased assist and has difficulty with foot placement onto stair to ascend.  required x 2 attempts and remains to fatigue with each trial.  pt required cues foer sequencing and RW placement.    Wheelchair Mobility    Modified Rankin (Stroke Patients Only)       Balance Overall balance assessment: Needs assistance Sitting-balance support: Feet supported Sitting balance-Leahy Scale: Good     Standing balance support: Bilateral upper extremity supported Standing balance-Leahy Scale: Fair                      Cognition Arousal/Alertness: Awake/alert Behavior During Therapy: WFL for tasks assessed/performed;Impulsive Overall Cognitive Status: Within Functional Limits for tasks assessed                      Exercises      General Comments        Pertinent Vitals/Pain Pain Assessment: No/denies pain    Home Living                      Prior Function            PT Goals (current goals can now be found in the care plan section) Acute Rehab PT Goals Patient Stated Goal: To be able to return home Potential  to Achieve Goals: Good Progress towards PT goals: Progressing toward goals    Frequency  Min 3X/week    PT Plan Current plan remains appropriate    Co-evaluation             End of Session Equipment Utilized During Treatment: Gait belt;Oxygen Activity Tolerance: Patient tolerated treatment well Patient left: in chair;with call bell/phone within reach;with chair alarm set     Time: [phone] PT Time Calculation (min) (ACUTE ONLY): 30 min  Charges:  $Gait Training: 8-22 mins $Therapeutic Activity: 8-22 mins                    G Codes:      Florestine Avers 16-Sep-2015, 9:49 AM Joycelyn Rua, PTA pager  [phone]  Pt performed seated there ex AROM to B LE in seated position 1x10 reps.  Pt performed marching, long arc quads, hip abduction with resistance, and pillow squeeze, ankle pumps.  Pt performed to improve strength and promote functional carryover.  Pt required cues for technique and pacing to avoid use of momentum to complete.    Joycelyn Rua, PTA pager [phone]

## 2015-08-21 LAB — HEPARIN LEVEL (UNFRACTIONATED): Heparin Unfractionated: 0.59 IU/mL (ref 0.30–0.70)

## 2015-08-21 LAB — CBC
HCT: 35.6 % — ABNORMAL LOW (ref 36.0–46.0)
Hemoglobin: 10.7 g/dL — ABNORMAL LOW (ref 12.0–15.0)
MCH: 26.8 pg (ref 26.0–34.0)
MCHC: 30.1 g/dL (ref 30.0–36.0)
MCV: 89 fL (ref 78.0–100.0)
PLATELETS: 199 10*3/uL (ref 150–400)
RBC: 4 MIL/uL (ref 3.87–5.11)
RDW: 18.9 % — AB (ref 11.5–15.5)
WBC: 9.7 10*3/uL (ref 4.0–10.5)

## 2015-08-21 LAB — PROTIME-INR
INR: 2.48 — ABNORMAL HIGH (ref 0.00–1.49)
Prothrombin Time: 26.5 seconds — ABNORMAL HIGH (ref 11.6–15.2)

## 2015-08-21 LAB — GLUCOSE, CAPILLARY: Glucose-Capillary: 99 mg/dL (ref 65–99)

## 2015-08-21 MED ORDER — WARFARIN 0.5 MG HALF TABLET
0.5000 mg | ORAL_TABLET | Freq: Once | ORAL | Status: DC
  Filled 2015-08-21: qty 1

## 2015-08-21 NOTE — Progress Notes (Signed)
[facility] PROGRESS NOTE    Progress Note   Carla [name] ZOX:[id number] DOB: 1921/09/07 DOA: 08/15/2015 PCP: [name], MD   Brief Narrative:   Carla [name] is an 82 y.o. female lives at home fairly independent with a history of chronic A. Fib not on Coumadin anemia of chronic disease history of GI bleed in the past comes into the hospital complaining of shortness of breath, CT in June of the chest was done and was found to have a submassive PE with right heart strain.  Assessment/Plan:   Acute respiratory failure with hypoxia due to Acute submassive Pulmonary embolism (HCC)/  Acute pulmonary embolism (HCC) with rt heart strin on CT: Continue nasal cannula oxygen, Saturations dropped to 88% with ambulation on room air, will go home on oxygen. She is on heparin drip, was discussed with PCCM,lower extremity Doppler that showed a large lower extremity clot, so IVC filter. Continue IV heparin and Coumadin, INR is therapeutic overlap for 1 additional day.  Chronic Atrial fibrillation (HCC): [place] vasc 2 score of 4. She is rate control continue amiodarone and heparin.  Right lower chest remedy swelling due to large DVT: As per #1.   Adult hypothyroidism: Continue Synthroid   Elevated troponin level Mildly elevated troponins in the setting of submassive PE unlikely ACS pattern. 2-D echo showed no wall motion abnormality.      DVT Prophylaxis Heparin and Coumadin  Family Communication: none Disposition Plan: Home in am Code Status:     Code Status Orders        Start     Ordered   08/15/15 2305  Full code   Continuous     08/15/15 2305    Code Status History    Date Active Date Inactive Code Status Order ID Comments User Context   01/08/2013 12:45 PM 01/10/2013  6:19 PM Full Code [id number]  Maryruth Bun Rama, MD Inpatient   08/19/2012  3:14 PM 08/27/2012  8:53 PM Full Code [id number]  Renae Fickle, MD Inpatient    Advance Directive Documentation      Most Recent Value   Type of Advance Directive  Living will   Pre-existing out of facility DNR order (yellow form or pink MOST form)     "MOST" Form in Place?          IV Access:    Peripheral IV   Procedures and diagnostic studies:   No results found.   Medical Consultants:    None.  Anti-Infectives:   Anti-infectives    None      Subjective:    Carla [name] no complains.  Objective:    Filed Vitals:   08/20/15 2109 08/21/15 0527 08/21/15 0620 08/21/15 0932  BP: 150/76 173/70 153/71 154/72  Pulse: 84 80  80  Temp: 97.5 F (36.4 C) 98.4 F (36.9 C)    TempSrc: Oral Oral    Resp: 18 18    Height:      Weight:      SpO2: 96% 95%      Intake/Output Summary (Last 24 hours) at 08/21/15 1059 Last data filed at 08/21/15 0733  Gross per 24 hour  Intake    829 ml  Output      0 ml  Net    829 ml   Filed Weights   08/15/15 1445 08/16/15 0350  Weight: 79.379 kg (175 lb) 76.5 kg (168 lb 10.4 oz)    Exam: Gen:  NAD Cardiovascular:  RRR. Chest and lungs:  Good air movement clear to auscultation. Abdomen:  Abdomen soft, NT/ND, + BS Extremities:  No edema   Data Reviewed:    Labs: Basic Metabolic Panel:  Recent Labs Lab 08/15/15 1554 08/16/15 0007 08/16/15 0526 08/17/15 0530 08/18/15 0231  NA 142 143 144 143 142  K 3.9 3.5 3.8 3.2* 5.1  CL 103 107 105 107 106  CO2  --  26 26 25 23   GLUCOSE 128* 146* 115* 118* 111*  BUN 19 11 12 12 20   CREATININE 1.10* 1.24* 1.07* 1.08* 1.49*  CALCIUM  --  8.3* 8.3* 8.2* 8.8*  MG  --  1.7  --   --  2.1  PHOS  --  3.0  --   --   --    GFR Estimated Creatinine Clearance: 23.8 mL/min (by C-G formula based on Cr of 1.49). Liver Function Tests:  Recent Labs Lab 08/16/15 0526 08/17/15 0530  AST 19 19  ALT 9* 10*  ALKPHOS 59 56  BILITOT 0.3 0.4  PROT 5.7* 5.7*  ALBUMIN 2.5* 2.4*   No results for input(s): LIPASE, AMYLASE in the last 168 hours. No results for input(s): AMMONIA in the last  168 hours. Coagulation profile  Recent Labs Lab 08/17/15 0900 08/19/15 0825 08/20/15 0905 08/21/15 0633  INR 1.17 1.17 1.75* 2.48*    CBC:  Recent Labs Lab 08/15/15 1545  08/16/15 0526 08/17/15 0530 08/18/15 0231 08/19/15 0825 08/20/15 0905 08/21/15 0633  WBC 10.8*  --  10.6* 11.1* 10.5 8.8 9.9 9.7  NEUTROABS 8.6*  --  7.2 7.8*  --   --   --   --   HGB 13.6  < > 11.9* 12.1 11.9* 11.6* 11.2* 10.7*  HCT 42.8  < > 37.4 39.4 39.1 38.2 37.3 35.6*  MCV 87.7  --  87.2 87.9 88.7 88.8 88.8 89.0  PLT 204  --  170 191 198 211 197 199  < > = values in this interval not displayed. Cardiac Enzymes:  Recent Labs Lab 08/16/15 0007 08/16/15 0526  TROPONINI 0.82* 0.62*   BNP (last 3 results) No results for input(s): PROBNP in the last 8760 hours. CBG:  Recent Labs Lab 08/17/15 0754 08/18/15 2131 08/19/15 0804 08/20/15 0737 08/21/15 0747  GLUCAP 92 121* 101* 108* 99   D-Dimer: No results for input(s): DDIMER in the last 72 hours. Hgb A1c: No results for input(s): HGBA1C in the last 72 hours. Lipid Profile: No results for input(s): CHOL, HDL, LDLCALC, TRIG, CHOLHDL, LDLDIRECT in the last 72 hours. Thyroid function studies: No results for input(s): TSH, T4TOTAL, T3FREE, THYROIDAB in the last 72 hours.  Invalid input(s): FREET3 Anemia work up: No results for input(s): VITAMINB12, FOLATE, FERRITIN, TIBC, IRON, RETICCTPCT in the last 72 hours. Sepsis Labs:  Recent Labs Lab 08/18/15 0231 08/19/15 0825 08/20/15 0905 08/21/15 0633  WBC 10.5 8.8 9.9 9.7   Microbiology Recent Results (from the past 240 hour(s))  MRSA PCR Screening     Status: None   Collection Time: 08/15/15 11:58 PM  Result Value Ref Range Status   MRSA by PCR NEGATIVE NEGATIVE Final    Comment:        The GeneXpert MRSA Assay (FDA approved for NASAL specimens only), is one component of a comprehensive MRSA colonization surveillance program. It is not intended to diagnose MRSA infection nor  to guide or monitor treatment for MRSA infections.   C difficile quick scan w PCR reflex     Status: None   Collection Time: 08/19/15  4:31 PM  Result Value Ref Range Status   C Diff antigen NEGATIVE NEGATIVE Final   C Diff toxin NEGATIVE NEGATIVE Final   C Diff interpretation Negative for toxigenic C. difficile  Final     Medications:   . acetaminophen  650 mg Oral BID  . amiodarone  100 mg Oral Daily  . calcium-vitamin D  1 tablet Oral BID  . docusate sodium  100 mg Oral BID  . levothyroxine  75 mcg Oral QAC breakfast  . pantoprazole  40 mg Oral Daily  . warfarin  0.5 mg Oral ONCE-1800  . Warfarin - Pharmacist Dosing Inpatient   Does not apply q1800   Continuous Infusions: . sodium chloride 10 mL/hr at 08/20/15 1900  . heparin 1,100 Units/hr (08/20/15 1900)    Time spent: 15 min   LOS: 6 days   Marinda Elk  [facility] Pager [phone]  *Please refer to [email or web address], password [email or web address] to get updated schedule on who will round on this patient, as hospitalists switch teams weekly. If 7PM-7AM, please contact night-coverage at [email or web address], password [email or web address] for any overnight needs.  08/21/2015, 10:59 AM

## 2015-08-21 NOTE — Progress Notes (Signed)
ANTICOAGULATION CONSULT NOTE - Follow Up Consult  Pharmacy Consult for Heparin; add Coumadin Indication: PE/DVT  Allergies  Allergen Reactions  . Bactrim [Sulfamethoxazole-Trimethoprim] Other (See Comments)    Has chronic kidney disease.  This is not recommended by nephrologist.    . Nsaids Other (See Comments)    Has chronic kidney disease.  This is not recommended by nephrologist.    Patient Measurements: Height: 5' 2.5" (158.8 cm) Weight: 168 lb 10.4 oz (76.5 kg) IBW/kg (Calculated) : 51.25 Heparin Dosing Weight: 67.6kg  Vital Signs: Temp: 98.4 F (36.9 C) (02/16 0527) Temp Source: Oral (02/16 0527) BP: 153/71 mmHg (02/16 0620) Pulse Rate: 80 (02/16 0527)  Labs:  Recent Labs  08/19/15 0825 08/20/15 0905 08/21/15 0633  HGB 11.6* 11.2* 10.7*  HCT 38.2 37.3 35.6*  PLT 211 197 199  LABPROT 15.1 20.4* 26.5*  INR 1.17 1.75* 2.48*  HEPARINUNFRC 0.57 0.58 0.59    Estimated Creatinine Clearance: 23.8 mL/min (by C-G formula based on Cr of 1.49).   Medications:  Heparin @ 1100 units/hr  Assessment: 89yof continues on heparin+warfarin for PE/DVT. Heparin level is therapeutic at 0.59. Patient started coumadin 2/13. Baseline INR 1.17. Coumadin score = 3. Watch for drug interaction with amiodarone/Macrobid (on chronically). INR increased 1.75>>2.48 this AM. Will give very small dose of warfarin tonight due to large INR jump in last 2 days. Hg low stable, plt wnl. No bleed documented.  She will need at least 5 days of overlap therapy with heparin and coumadin. Today will be day #4.  2/10 CT angio: submassive PE with mild right heart strain 2/11 Bilateral LE dopplers revealed multiple thrombi within the right common femoral artery  2/11 IVC filter placed  Goal of Therapy:  Heparin level 0.3-0.7 units/ml Monitor platelets by anticoagulation protocol: Yes   Plan:  Continue heparin at 1100 units/h - D#4/5 overlap Coumadin 0.5mg  x 1 Heparin level/INR/CBC Mon s/sx  bleeding  Babs Bertin, PharmD, BCPS Clinical Pharmacist Pager [phone] 08/21/2015 8:48 AM

## 2015-08-22 DIAGNOSIS — J9601 Acute respiratory failure with hypoxia: Secondary | ICD-10-CM

## 2015-08-22 LAB — CBC
HEMATOCRIT: 37.6 % (ref 36.0–46.0)
HEMOGLOBIN: 11.9 g/dL — AB (ref 12.0–15.0)
MCH: 28.4 pg (ref 26.0–34.0)
MCHC: 31.6 g/dL (ref 30.0–36.0)
MCV: 89.7 fL (ref 78.0–100.0)
Platelets: 194 10*3/uL (ref 150–400)
RBC: 4.19 MIL/uL (ref 3.87–5.11)
RDW: 19 % — ABNORMAL HIGH (ref 11.5–15.5)
WBC: 9.9 10*3/uL (ref 4.0–10.5)

## 2015-08-22 LAB — PROTIME-INR
INR: 2.77 — AB (ref 0.00–1.49)
Prothrombin Time: 28.8 seconds — ABNORMAL HIGH (ref 11.6–15.2)

## 2015-08-22 LAB — HEPARIN LEVEL (UNFRACTIONATED): HEPARIN UNFRACTIONATED: 0.75 [IU]/mL — AB (ref 0.30–0.70)

## 2015-08-22 LAB — GLUCOSE, CAPILLARY: Glucose-Capillary: 128 mg/dL — ABNORMAL HIGH (ref 65–99)

## 2015-08-22 MED ORDER — WARFARIN SODIUM 1 MG PO TABS: 2.0000 mg | ORAL_TABLET | Freq: Once | ORAL | Status: DC

## 2015-08-22 MED ORDER — WARFARIN SODIUM 1 MG PO TABS
1.0000 mg | ORAL_TABLET | Freq: Once | ORAL | Status: AC
  Administered 2015-08-22: 1 mg via ORAL
  Filled 2015-08-22: qty 1

## 2015-08-22 MED ORDER — COUMADIN BOOK
Freq: Once | Status: DC
  Filled 2015-08-22: qty 1

## 2015-08-22 NOTE — Care Management Important Message (Signed)
Important Message  Patient Details  Name: Carla [name] MRN: [id number] Date of Birth: Apr 20, 1922   Medicare Important Message Given:  Yes    [name] Abena 08/22/2015, 11:40 AM

## 2015-08-22 NOTE — Progress Notes (Signed)
NURSING PROGRESS NOTE  Carla [name] [id number] Discharge Data: 08/22/2015 7:27 PM Attending Provider: No att. providers found WUJ:[id number],NWGNFAO C, MD     [name] to be D/C'd Home per MD order.  Discussed with the patient the After Visit Summary and all questions fully answered. All IV's discontinued with no bleeding noted. All belongings returned to patient for patient to take home.   Last Vital Signs:  Blood pressure 146/50, pulse 74, temperature 98.7 F (37.1 C), temperature source Oral, resp. rate 20, height 5' 2.5" (1.588 m), weight 76.5 kg (168 lb 10.4 oz), SpO2 94 %.  Discharge Medication List   Medication List    TAKE these medications        acetaminophen 650 MG CR tablet  Commonly known as:  TYLENOL  Take 650 mg by mouth 2 (two) times daily.     amiodarone 200 MG tablet  Commonly known as:  PACERONE  TAKE 1/2 TABLET BY MOUTH DAILY     Calcium-Vitamin D 600-400 MG-UNIT Tabs  Take 1 tablet by mouth 2 (two) times daily.     docusate sodium 100 MG capsule  Commonly known as:  COLACE  Take 100 mg by mouth 2 (two) times daily.     levothyroxine 75 MCG tablet  Commonly known as:  SYNTHROID, LEVOTHROID  Take 75 mcg by mouth daily before breakfast.     nitrofurantoin 50 MG capsule  Commonly known as:  MACRODANTIN  Take 50 mg by mouth.     pantoprazole 40 MG tablet  Commonly known as:  PROTONIX  Take 40 mg by mouth daily.     warfarin 1 MG tablet  Commonly known as:  COUMADIN  Take 2 tablets (2 mg total) by mouth one time only at 6 PM.         Bennie Pierini, RN

## 2015-08-22 NOTE — Discharge Summary (Addendum)
Physician Discharge Summary  Carla [name] ZOX:[id number] DOB: Feb 25, 1922 DOA: 08/15/2015  PCP: [name], MD  Admit date: 08/15/2015 Discharge date: 08/22/2015  Time spent: 35 minutes  Recommendations for Outpatient Follow-up:  1. Follow-up with Coumadin clinic on Monday to check an INR. 2. Follow-up with PCP in 2-4 weeks later and see if she still requires oxygen. 3. Home with physical therapy   Discharge Diagnoses:  Principal Problem:   Acute respiratory failure with hypoxia (HCC) Active Problems:   Atrial fibrillation (HCC)   Adult hypothyroidism   Hypothyroid   Pulmonary embolism (HCC)   Elevated troponin level   Elevated brain natriuretic peptide (BNP) level   Pressure ulcer   Acute pulmonary embolism (HCC) with rt heart strin on CT   Pulmonary embolism with acute cor pulmonale (HCC)   Hypoxemia   Discharge Condition: guarded  Diet recommendation: regular  Filed Weights   08/15/15 1445 08/16/15 0350  Weight: 79.379 kg (175 lb) 76.5 kg (168 lb 10.4 oz)    History of present illness:  82 y.o. female with a past medical history of atrial fibrillation, anemia, hyperlipidemia, osteoporosis, female bladder prolapse, cataracts, diverticulosis, hemorrhoids, gallstones, hypothyroidism comes to the ER with her daughter due to sudden onset of shortness of breath earlier this morning  Hospital Course:  Acute respiratory failure with hypoxia due to submassive pulmonary embolism with right heart strain on CT scan and lower extremity DVT. Due to her massive PE she was started on IV heparin PCCM was consulted, lower extremity Doppler was done that showed a large extremity DVT, so an IVC filter was placed.  She was continued on Coumadin and IV heparin for 5 days once her INR was 2 heparin was DC'd. She was ambulated and her oxygen saturations with ambulation and 85-88% so she will go home on 2 L of oxygen.  Chronic atrial fibrillation: Continue amiodarone and Coumadin  she's currently rate controlled in sinus rhythm. [place] vasc 2 score of 4.  Right lower extremity large DVT: As per No. 1.  Hypothyroidism: Continue Synthroid.  Elevated troponins: Likely due to massive PE, 2-D echo showed normal motion abnormality.  Procedures:  CT angiogram of the chest  From a Doppler  Chest x-ray  IVC filter placement on 08/16/2015.  2-D echo  Consultations:  PCCM  IR  Discharge Exam: Filed Vitals:   08/21/15 2129 08/22/15 0552  BP: 151/64 158/64  Pulse: 75 67  Temp:  97.6 F (36.4 C)  Resp: 18 20    General: A&O x3 Cardiovascular: RRR Respiratory: good air movement CTA B/L  Discharge Instructions   Discharge Instructions    Diet - low sodium heart healthy    Complete by:  As directed      Increase activity slowly    Complete by:  As directed           Current Discharge Medication List    START taking these medications   Details  warfarin (COUMADIN) 1 MG tablet Take 2 tablets (2 mg total) by mouth one time only at 6 PM. Qty: 30 tablet, Refills: 0      CONTINUE these medications which have NOT CHANGED   Details  acetaminophen (TYLENOL) 650 MG CR tablet Take 650 mg by mouth 2 (two) times daily.    amiodarone (PACERONE) 200 MG tablet TAKE 1/2 TABLET BY MOUTH DAILY Qty: 45 tablet, Refills: 2    Calcium Carb-Cholecalciferol (CALCIUM-VITAMIN D) 600-400 MG-UNIT TABS Take 1 tablet by mouth 2 (two) times daily.  docusate sodium (COLACE) 100 MG capsule Take 100 mg by mouth 2 (two) times daily.     levothyroxine (SYNTHROID, LEVOTHROID) 75 MCG tablet Take 75 mcg by mouth daily before breakfast.    nitrofurantoin (MACRODANTIN) 50 MG capsule Take 50 mg by mouth.    pantoprazole (PROTONIX) 40 MG tablet Take 40 mg by mouth daily.       Allergies  Allergen Reactions  . Bactrim [Sulfamethoxazole-Trimethoprim] Other (See Comments)    Has chronic kidney disease.  This is not recommended by nephrologist.    . Nsaids Other (See  Comments)    Has chronic kidney disease.  This is not recommended by nephrologist.   Follow-up Information    Follow up with BADGER,MICHAEL C, MD. Schedule an appointment as soon as possible for a visit in 1 week.   Specialty:  Family Medicine   Contact information:   [address] [place] [address] [phone]       Follow up with Lawanda Cousins, MD. Schedule an appointment as soon as possible for a visit in 1 week.   Specialty:  Pulmonary Disease   Contact information:   [address] [place] [address] [phone]       Follow up with Simonne Come, MD. Schedule an appointment as soon as possible for a visit on 10/20/2015.   Specialty:  Interventional Radiology   Why:  IVC filter removal   Contact information:   [address] [place] [address] [phone]       Follow up with [facility].   Why:  OXYGEN   Contact information:   [address] [place] [address] [phone]       Follow up with [facility].   Specialty:  Home Health Services   Why:  RN, PT   Contact information:   [address] [place] [address] [phone]       Follow up with [facility].   Why:  Oxygen arranged, will be delivered to bedside   Contact information:   [address] [place] [address] [phone]       Follow up with [facility].   Specialty:  Home Health Services   Why:  Home health RN,PT,OT arranged   Contact information:   [address] [place] [address] [phone]        The results of significant diagnostics from this hospitalization (including imaging, microbiology, ancillary and laboratory) are listed below for reference.    Significant Diagnostic Studies: Dg Chest 2 View  08/15/2015  CLINICAL DATA:  Acute shortness of breath since waking up this morning. EXAM: CHEST  2 VIEW COMPARISON:  01/08/2013 FINDINGS:  The cardiomediastinal silhouette is unchanged, with a large hiatal hernia again seen. There remains elevation of the right hemidiaphragm. Chronic diffuse interstitial coarsening and more focal scarring or atelectasis in the left lung base do not appear significantly changed. There is no evidence of acute airspace consolidation, overt edema, pleural effusion, or pneumothorax. Diffuse aortic calcification is noted. No acute osseous abnormality is seen. IMPRESSION: Stable appearance of the chest without evidence of active cardiopulmonary disease. Electronically Signed   By: Sebastian Ache M.D.   On: 08/15/2015 15:33   Ct Angio Chest Pe W/cm &/or Wo Cm  08/15/2015  CLINICAL DATA:  Shortness of breath and chest pain for 1 week EXAM: CT ANGIOGRAPHY CHEST WITH CONTRAST  TECHNIQUE: Multidetector CT imaging of the chest was performed using the standard protocol during bolus administration of intravenous contrast. Multiplanar CT image reconstructions and MIPs were obtained to evaluate the vascular anatomy. CONTRAST:  OMNIPAQUE IOHEXOL 350 MG/ML SOLN COMPARISON:  Chest CT August 21, 2012; chest radiograph August 15, 2015 FINDINGS: There is extensive pulmonary embolism with pulmonary emboli arising from each main pulmonary artery and extending into upper and proximal lower lobe pulmonary arteries bilaterally. There is more pulmonary embolus on the right than on the left with extensive pulmonary embolus extending into several right lower lobe pulmonary branches as well as into the right middle and right upper lobe pulmonary branches. There is right heart strain with the right ventricle to left ventricle diameter ratio of 1.5, normal less than 0.9. There is no thoracic aortic aneurysm or dissection. There is atherosclerotic change throughout much of the aorta. The visualized great vessels appear normal except for moderate atherosclerotic calcification at the origin of the left subclavian artery. There is left  ventricular hypertrophy. The pericardium is not thickened. There are foci of coronary artery calcification. There is a small calcified granuloma in the lateral left base. There is a focal area of ground-glass opacity in the periphery of the anterior segment of the left upper lobe measuring 0.8 x 0.7 cm. There is similar-appearing ground-glass type opacity in the anterior left apex measuring 1.2 x 0.8 cm. Thyroid appears normal. There is no appreciable thoracic adenopathy. There is a sizable hiatal type hernia with most of the stomach above the diaphragm. In the visualized upper abdomen, there is atherosclerotic calcification in the aorta. Gallbladder is absent. Incomplete visualization of the pancreas suggests that there is moderate fatty infiltration in the pancreas. There is contrast in portions of the inferior vena cava and hepatic arteries. There is degenerative change in the thoracic spine with increase in kyphosis. There are no blastic or lytic bone lesions. Review of the MIP images confirms the above findings. IMPRESSION: Positive for acute PE with CT evidence of right heart strain (RV/LV Ratio = 1.5) consistent with at least submassive (intermediate risk) PE. The presence of right heart strain has been associated with an increased risk of morbidity and mortality. Please activate Code PE by paging [phone]. Sizable hiatal hernia with most of the stomach above the diaphragm. Ground-glass opacities on the left, largest measuring 1.2 x 0.8 cm. Followup noncontrast enhanced CT in 3 months advised to assess for stability. It should be noted that atypical neoplasm could present in this manner. There are foci of coronary artery calcification. There is left ventricular hypertrophy. No demonstrable adenopathy. Critical Value/emergent results were called by telephone at the time of interpretation on 08/15/2015 at 5:19 pm to [facility], [facility], who verbally acknowledged these results. Electronically Signed   By: Bretta Bang III M.D.   On: 08/15/2015 17:22   Ir Ivc Filter Plmt / S&i /img Guid/mod Sed  08/16/2015  INDICATION: History of pulmonary embolism and right lower extremity DVT. Patient with history of atrial fibrillation, however is not a candidate for anticoagulation secondary to multiple gynecological issues with associated anti coagulation induced bleeding. As such, request made for placement of an IVC filter for caval interruption purposes. EXAM: ULTRASOUND GUIDANCE FOR VASCULAR ACCESS IVC CATHETERIZATION AND VENOGRAM IVC FILTER INSERTION COMPARISON:  Chest CT - 08/15/2015; CT abdomen and pelvis - 01/08/2013 MEDICATIONS: None. ANESTHESIA/SEDATION: Moderate (conscious) sedation was employed during this procedure. A total of Versed 1 mg and Fentanyl 50 mcg was administered intravenously. Moderate  Sedation Time: 10 minutes. The patient's level of consciousness and vital signs were monitored continuously by radiology nursing throughout the procedure under my direct supervision. CONTRAST:  None, CO2 was utilized for this examination is secondary to patient's renal insufficiency. FLUOROSCOPY TIME:  1 minutes 24 seconds (55 mGy) COMPLICATIONS: None immediate PROCEDURE: Informed consent was obtained from the patient following explanation of the procedure, risks, benefits and alternatives. The patient understands, agrees and consents for the procedure. All questions were addressed. A time out was performed prior to the initiation of the procedure. Maximal barrier sterile technique utilized including caps, mask, sterile gowns, sterile gloves, large sterile drape, hand hygiene, and Betadine prep. Under sterile condition and local anesthesia, right internal jugular venous access was performed with ultrasound. An ultrasound image was saved and sent to PACS. Over a guidewire, the IVC filter delivery sheath and inner dilator were advanced into the IVC just above the IVC bifurcation. Contrast injection was performed for an IVC  venogram. Through the delivery sheath, a retrievable Denali IVC filter was deployed below the level of the renal veins and above the IVC bifurcation. Limited post deployment venacavagram was performed. The delivery sheath was removed and hemostasis was obtained with manual compression. A dressing was placed. The patient tolerated the procedure well without immediate post procedural complication. FINDINGS: The IVC is patent. No evidence of thrombus, stenosis, or occlusion. No variant venous anatomy. Successful placement of the IVC filter below the level of the renal veins. IMPRESSION: Successful ultrasound and fluoroscopically guided placement of an infrarenal retrievable IVC filter via right jugular approach. Due to patient related comorbidities and/or clinical necessity, this IVC filter should be considered a permanent device. This patient will not be actively followed for future filter retrieval. Electronically Signed   By: Simonne Come M.D.   On: 08/16/2015 19:40    Microbiology: Recent Results (from the past 240 hour(s))  MRSA PCR Screening     Status: None   Collection Time: 08/15/15 11:58 PM  Result Value Ref Range Status   MRSA by PCR NEGATIVE NEGATIVE Final    Comment:        The GeneXpert MRSA Assay (FDA approved for NASAL specimens only), is one component of a comprehensive MRSA colonization surveillance program. It is not intended to diagnose MRSA infection nor to guide or monitor treatment for MRSA infections.   C difficile quick scan w PCR reflex     Status: None   Collection Time: 08/19/15  4:31 PM  Result Value Ref Range Status   C Diff antigen NEGATIVE NEGATIVE Final   C Diff toxin NEGATIVE NEGATIVE Final   C Diff interpretation Negative for toxigenic C. difficile  Final     Labs: Basic Metabolic Panel:  Recent Labs Lab 08/15/15 1554 08/16/15 0007 08/16/15 0526 08/17/15 0530 08/18/15 0231  NA 142 143 144 143 142  K 3.9 3.5 3.8 3.2* 5.1  CL 103 107 105 107 106   CO2  --  26 26 25 23   GLUCOSE 128* 146* 115* 118* 111*  BUN 19 11 12 12 20   CREATININE 1.10* 1.24* 1.07* 1.08* 1.49*  CALCIUM  --  8.3* 8.3* 8.2* 8.8*  MG  --  1.7  --   --  2.1  PHOS  --  3.0  --   --   --    Liver Function Tests:  Recent Labs Lab 08/16/15 0526 08/17/15 0530  AST 19 19  ALT 9* 10*  ALKPHOS 59 56  BILITOT 0.3 0.4  PROT 5.7* 5.7*  ALBUMIN 2.5* 2.4*   No results for input(s): LIPASE, AMYLASE in the last 168 hours. No results for input(s): AMMONIA in the last 168 hours. CBC:  Recent Labs Lab 08/15/15 1545  08/16/15 0526 08/17/15 0530 08/18/15 0231 08/19/15 0825 08/20/15 0905 08/21/15 0633 08/22/15 0617  WBC 10.8*  --  10.6* 11.1* 10.5 8.8 9.9 9.7 9.9  NEUTROABS 8.6*  --  7.2 7.8*  --   --   --   --   --   HGB 13.6  < > 11.9* 12.1 11.9* 11.6* 11.2* 10.7* 11.9*  HCT 42.8  < > 37.4 39.4 39.1 38.2 37.3 35.6* 37.6  MCV 87.7  --  87.2 87.9 88.7 88.8 88.8 89.0 89.7  PLT 204  --  170 191 198 211 197 199 194  < > = values in this interval not displayed. Cardiac Enzymes:  Recent Labs Lab 08/16/15 0007 08/16/15 0526  TROPONINI 0.82* 0.62*   BNP: BNP (last 3 results)  Recent Labs  08/15/15 1545  BNP 295.4*    ProBNP (last 3 results) No results for input(s): PROBNP in the last 8760 hours.  CBG:  Recent Labs Lab 08/18/15 2131 08/19/15 0804 08/20/15 0737 08/21/15 0747 08/22/15 0801  GLUCAP 121* 101* 108* 99 128*     Signed:  Marinda Elk MD.  [facility] 08/22/2015, 2:03 PM

## 2015-08-22 NOTE — Discharge Instructions (Signed)

## 2015-08-22 NOTE — Progress Notes (Signed)
Physical Therapy Treatment Patient Details Name: Carla [name] MRN: [id number] DOB: [birth year] Today's Date: 08/22/2015    History of Present Illness Patient is a 82 yo female admitted 08/15/15 with SOB and weakness.  Patient with pulmonary embolism, RLE DVT, and now s/p IVC filter placement.   PMH:  Afib, osteoporosis, scoliosis    PT Comments    Pt performed improved stair training and required decreased assist to negotiate stair safely.  Pt reports daughter is flying in from [facility] to stay x 1 week with patient.    Follow Up Recommendations  Supervision - Intermittent;Home health PT (if 24/hour care cannot be provided pt will require SNF.  )     Equipment Recommendations  None recommended by PT    Recommendations for Other Services       Precautions / Restrictions Precautions Precautions: Fall Restrictions Weight Bearing Restrictions: No    Mobility  Bed Mobility Overal bed mobility:  (pt recieved in recliner chair.  )                Transfers Overall transfer level: Needs assistance Equipment used: Rolling walker (2 wheeled) Transfers: Sit to/from Stand Sit to Stand: Supervision         General transfer comment: remains to require VCs for hand placement to improve safety.  NO LOB noted demonstrated improved forward weight shifting.  Ambulation/Gait Ambulation/Gait assistance: Supervision Ambulation Distance (Feet): 250 Feet Assistive device: Rolling walker (2 wheeled) Gait Pattern/deviations: Step-through pattern;Decreased stride length;Trunk flexed     General Gait Details: Pt performed increased gait distance with cues for sequencing and RW placement to maintain safety.  Pt required constant cues for erect posture.     Stairs Stairs: Yes Stairs assistance: Min guard   Number of Stairs: 2 General stair comments: Pt demonstrated improved carryover with foot placement to improve safety.  pt able to follow commands to negotiate x1 stairs and able to  teach back method to PTA.    Wheelchair Mobility    Modified Rankin (Stroke Patients Only)       Balance Overall balance assessment: Needs assistance Sitting-balance support: Feet supported Sitting balance-Leahy Scale: Good     Standing balance support: Bilateral upper extremity supported;During functional activity Standing balance-Leahy Scale: Fair                      Cognition Arousal/Alertness: Awake/alert Behavior During Therapy: WFL for tasks assessed/performed;Impulsive Overall Cognitive Status: Within Functional Limits for tasks assessed                      Exercises      General Comments        Pertinent Vitals/Pain Pain Assessment: No/denies pain    Home Living                      Prior Function            PT Goals (current goals can now be found in the care plan section) Acute Rehab PT Goals Patient Stated Goal: To be able to return home Potential to Achieve Goals: Good Progress towards PT goals: Progressing toward goals    Frequency  Min 3X/week    PT Plan Current plan remains appropriate    Co-evaluation             End of Session Equipment Utilized During Treatment: Gait belt;Oxygen Activity Tolerance: Patient tolerated treatment well Patient left: in chair;with call bell/phone within reach;with  chair alarm set     Time: 1029-1056 PT Time Calculation (min) (ACUTE ONLY): 27 min  Charges:  $Gait Training: 8-22 mins $Therapeutic Activity: 8-22 mins                    G Codes:      Florestine Avers 03-Sep-2015, 2:32 PM  Joycelyn Rua, PTA pager [phone]

## 2015-08-22 NOTE — Progress Notes (Signed)
ANTICOAGULATION CONSULT NOTE - Follow Up Consult  Pharmacy Consult for Heparin; Coumadin Indication: PE/DVT  Allergies  Allergen Reactions  . Bactrim [Sulfamethoxazole-Trimethoprim] Other (See Comments)    Has chronic kidney disease.  This is not recommended by nephrologist.    . Nsaids Other (See Comments)    Has chronic kidney disease.  This is not recommended by nephrologist.    Patient Measurements: Height: 5' 2.5" (158.8 cm) Weight: 168 lb 10.4 oz (76.5 kg) IBW/kg (Calculated) : 51.25 Heparin Dosing Weight: 67.6kg  Vital Signs: Temp: 97.6 F (36.4 C) (02/17 0552) Temp Source: Oral (02/17 0552) BP: 158/64 mmHg (02/17 0552) Pulse Rate: 67 (02/17 0552)  Labs:  Recent Labs  08/20/15 0905 08/21/15 1610 08/22/15 0617  HGB 11.2* 10.7* 11.9*  HCT 37.3 35.6* 37.6  PLT 197 199 194  LABPROT 20.4* 26.5* 28.8*  INR 1.75* 2.48* 2.77*  HEPARINUNFRC 0.58 0.59 0.75*    Estimated Creatinine Clearance: 23.8 mL/min (by C-G formula based on Cr of 1.49).   Medications:  Heparin @ 1100 units/hr  Assessment: 89yof continues on heparin+warfarin for PE/DVT. Heparin level is therapeutic at 0.59. Patient started coumadin 2/13. Baseline INR 1.17. Coumadin score = 3. Watch for drug interaction with amiodarone/Macrobid (on chronically). INR increased 1.75>>2.48>>2.77. Pt did not get dose 2/16, however INR remains therapeutic. Hg low stable, plt wnl. No bleed documented. Heparin level supratherapeutic this AM at 0.75  She will need at least 5 days of overlap therapy with heparin and coumadin. Today will be day #5.  2/10 CT angio: submassive PE with mild right heart strain 2/11 Bilateral LE dopplers revealed multiple thrombi within the right common femoral artery  2/11 IVC filter placed  Goal of Therapy:  Heparin level 0.3-0.7 units/ml Monitor platelets by anticoagulation protocol: Yes   Plan:  Decrease heparin to 1000 units/h and check 8 hour heparin level - D#5/5 overlap. Plan to  discontinue in the morning if INR remains therapeutic Coumadin 1 mg x 1 Daily heparin level/INR/CBC Mon s/sx bleeding  Thank you for allowing [place] to participate in this patients care. Signe Colt, PharmD Pager: [phone] 08/22/2015 9:02 AM

## 2015-08-25 ENCOUNTER — Telehealth: Payer: Self-pay

## 2015-08-25 ENCOUNTER — Telehealth: Payer: Self-pay | Admitting: Cardiology

## 2015-08-25 NOTE — Telephone Encounter (Signed)
Spoke with patient's daughter - patient was discharged on 2/17 with warfarin for pulmonary embolism.  Patient's daughter is unsure who is to follow PT/INR Patient is scheduled to see Dr. [place] on 2/27 Patient's dgtr is awaiting a return call from pulmonary for appointment and will check on warfarin monitoring with them.  [facility] came to do lab work and reported labs below (per dgtr): -- INR 4.6 -- PT 58.4 Daughter states [facility] nurse was unsure who the orders for home PT/INR checks came from and was calling someone named Arline Asp - informed her there is no Administrator, sports at this practice.  Warfarin is new despite patient having AF.  No further action necessary at this time.

## 2015-08-25 NOTE — Telephone Encounter (Signed)
Pt can see Danford Bad, NP on 08/27/15 LM x 1

## 2015-08-25 NOTE — Telephone Encounter (Signed)
New Message  Pt dtr calling to speak w/ RN concerning pt being followin in Coum clinic- pt dtr stated that she was to have immediate appt. Please call back and discuss.

## 2015-08-25 NOTE — Telephone Encounter (Signed)
Pt made an apt. Nothing further needed at this time.

## 2015-08-27 ENCOUNTER — Inpatient Hospital Stay: Payer: Medicare Other | Admitting: Adult Health

## 2015-09-01 ENCOUNTER — Encounter: Payer: Self-pay | Admitting: Cardiology

## 2015-09-01 ENCOUNTER — Ambulatory Visit (INDEPENDENT_AMBULATORY_CARE_PROVIDER_SITE_OTHER): Payer: Medicare Other | Admitting: Cardiology

## 2015-09-01 VITALS — BP 170/96 | HR 98 | Ht 62.5 in | Wt 157.1 lb

## 2015-09-01 DIAGNOSIS — I2699 Other pulmonary embolism without acute cor pulmonale: Secondary | ICD-10-CM

## 2015-09-01 DIAGNOSIS — I480 Paroxysmal atrial fibrillation: Secondary | ICD-10-CM

## 2015-09-01 NOTE — Patient Instructions (Signed)
Continue your current therapy  Check thyroid levels with next visit with Georgette Shell  I will see you in 6 months.

## 2015-09-02 NOTE — Progress Notes (Signed)
Carla [name] Date of Birth: August 25, 1921 Medical Record #[id number]  History of Present Illness: Carla [name] is seen today for followup after recent hospitalization. She was hospitalized in February 2014 with atrial fibrillation in the setting of urosepsis. She was placed on amiodarone and converted to normal sinus rhythm. Echocardiogram at that time showed mild right ventricular and right atrial enlargement. There was moderate pulmonary hypertension. In July 2014  Upper endoscopy demonstrated a massive hiatal hernia with esophagitis and gastritis. She does have a history of GI bleeding. She has been started on synthroid for hypothyroidism. She also has a history of vesicovaginal and vesicorectal fistulas. This is followed at [facility] and managed conservatively. Recently she was hospitalized with increased SOB and leg swelling. She was diagnosed with a PE and right leg DVT. She was anticoagulated and is now on Coumadin. Daughter reports INR 2.9 today. She also has an IVC filter in place. She was noted to have Afib initially on admission but then converted to NSR. Echo  Showed normal LV function with mild pulmonary HTN.  On follow up today she denies any chest pain, SOB, or palpitations. Her leg swelling has resolved.    Current Outpatient Prescriptions on File Prior to Visit  Medication Sig Dispense Refill  . acetaminophen (TYLENOL) 650 MG CR tablet Take 650 mg by mouth 2 (two) times daily.    Marland Kitchen amiodarone (PACERONE) 200 MG tablet TAKE 1/2 TABLET BY MOUTH DAILY (Patient taking differently: Take 100 mg by mouth daily.) 45 tablet 2  . Calcium Carb-Cholecalciferol (CALCIUM-VITAMIN D) 600-400 MG-UNIT TABS Take 1 tablet by mouth 2 (two) times daily.     Marland Kitchen docusate sodium (COLACE) 100 MG capsule Take 100 mg by mouth daily as needed (constipation).     Marland Kitchen levothyroxine (SYNTHROID, LEVOTHROID) 75 MCG tablet Take 75 mcg by mouth daily before breakfast.    . nitrofurantoin (MACRODANTIN) 50 MG capsule Take 50  mg by mouth.    . pantoprazole (PROTONIX) 40 MG tablet Take 40 mg by mouth daily.    Marland Kitchen warfarin (COUMADIN) 1 MG tablet Take 2 tablets (2 mg total) by mouth one time only at 6 PM. 30 tablet 0   No current facility-administered medications on file prior to visit.    Allergies  Allergen Reactions  . Bactrim [Sulfamethoxazole-Trimethoprim] Other (See Comments)    Has chronic kidney disease.  This is not recommended by nephrologist.    . Nsaids Other (See Comments)    Has chronic kidney disease.  This is not recommended by nephrologist.    Past Medical History  Diagnosis Date  . Iron deficiency anemia   . High cholesterol   . Osteoarthritis   . Female bladder prolapse   . Osteoporosis   . Cataracts, bilateral   . Gallstones   . Hemorrhoids   . Diverticulosis   . Hiatal hernia   . Atrial fibrillation (HCC)   . Cystocele   . Rectocele   . Scoliosis   . Lordosis   . Hyperlipidemia   . Osteoarthritis   . Dysrhythmia     A- fib  . Pneumonia     hx aspiration pneumonia  . Hypothyroidism   . Chronic kidney disease     chronic kidney disease  . Encounter for fitting and adjustment of pessary 04/23/2002  . Renal insufficiency     Past Surgical History  Procedure Laterality Date  . Cholecystectomy    . Cataract extraction    . Esophagogastroduodenoscopy    . Colonoscopy    .  Endometrial biopsy  09/2009  . Esophagogastroduodenoscopy N/A 01/09/2013    Procedure: ESOPHAGOGASTRODUODENOSCOPY (EGD);  Surgeon: Vertell Novak., MD;  Location: [facility];  Service: Endoscopy;  Laterality: N/A;  . Rectal ultrasound N/A 04/10/2014    Procedure: RECTAL ULTRASOUND;  Surgeon: Romie Levee, MD;  Location: [facility];  Service: Endoscopy;  Laterality: N/A;    History  Smoking status  . Never Smoker   Smokeless tobacco  . Never Used    History  Alcohol Use No    Family History  Problem Relation Age of Onset  . Breast cancer Paternal Aunt   . Breast cancer Paternal Aunt    . Cancer      several family members on dad's side of family  . Diabetes Father   . Leukemia Father   . Heart failure Brother     Pt cannot confirm this history    Review of Systems: As noted in history of present illness.  All other systems were reviewed and are negative.  Physical Exam: BP 170/96 mmHg  Pulse 98  Ht 5' 2.5" (1.588 m)  Wt 71.26 kg (157 lb 1.6 oz)  BMI 28.26 kg/m2 She is a pleasant overweight white female in no acute distress. HEENT is unremarkable. Lungs: Clear Cardiovascular: Regular rate and rhythm without gallop, murmur, or click. Abdomen: Soft and nontender. Bowel sounds positive. No masses or bruits. Extremities: Pulses are 2+ and symmetric. She has no edema. Neuro: Nonfocal.  LABORATORY DATA: Lab Results  Component Value Date   WBC 9.9 08/22/2015   HGB 11.9* 08/22/2015   HCT 37.6 08/22/2015   PLT 194 08/22/2015   GLUCOSE 111* 08/18/2015   ALT 10* 08/17/2015   AST 19 08/17/2015   NA 142 08/18/2015   K 5.1 08/18/2015   CL 106 08/18/2015   CREATININE 1.49* 08/18/2015   BUN 20 08/18/2015   CO2 23 08/18/2015   TSH 15.367* 01/08/2013   INR 2.77* 08/22/2015   HGBA1C 6.1* 01/08/2013   Echo: 08/16/15:Study Conclusions  - Left ventricle: The cavity size was normal. There was mild focal basal hypertrophy of the septum. Systolic function was normal. The estimated ejection fraction was in the range of 50% to 55%. Wall motion was normal; there were no regional wall motion abnormalities. Doppler parameters are consistent with abnormal left ventricular relaxation (grade 1 diastolic dysfunction). - Aortic valve: There was trivial regurgitation. - Mitral valve: Calcified annulus. There was mild regurgitation. - Pulmonary arteries: Systolic pressure was mildly increased. PA peak pressure: 34 mm Hg (S).  Impressions:  - Normal LV systolic function; grade 1 diastolic dysfunction; trace AI; mild MR and TR; mildly elevated pulmonary  pressure.   Assessment / Plan: 1. Atrial fibrillation- paroxysmal. Some recurrence in setting of acute PE. Now in NSR on amiodarone.  She is tolerating this well. Continue 100 mg daily. Recommend she have TSH every 6 months- will have primary care check this. 2. Hypertension-controlled.  3. Large hiatal hernia with esophagitis and gastritis.  4. PE and right leg DVT. Now on coumadin. IVC filter in place. Will follow up with interventional radiology concerning this. INRs followed by Dr. Cyndia Bent. I will follow up in 6 months.

## 2015-09-05 ENCOUNTER — Encounter: Payer: Self-pay | Admitting: Acute Care

## 2015-09-05 ENCOUNTER — Ambulatory Visit (INDEPENDENT_AMBULATORY_CARE_PROVIDER_SITE_OTHER): Payer: Medicare Other | Admitting: Acute Care

## 2015-09-05 VITALS — BP 148/78 | HR 83 | Ht 62.5 in | Wt 171.0 lb

## 2015-09-05 DIAGNOSIS — I2699 Other pulmonary embolism without acute cor pulmonale: Secondary | ICD-10-CM | POA: Diagnosis not present

## 2015-09-05 DIAGNOSIS — I2609 Other pulmonary embolism with acute cor pulmonale: Secondary | ICD-10-CM | POA: Diagnosis not present

## 2015-09-05 NOTE — Assessment & Plan Note (Addendum)
Therapeutic INR is 2.7 today. PE with Mildly elevated PAP of 34 mmHg. EF: 50-55% per echo 08/16/15. IVC placed 08/16/15 Atrial Fib on Amiodarone to treat ( Dr. SwazilandJordan)  Not requiring oxygen. Right leg swelling resolving. Plan: Coumadin therapy and INR management per Dr. Cyndia BentBadger  with goal of 2-3. Continue physical therapy to improve deconditioning. Encouraged fall precautions with anti-coagulation. Follow up with [facility] re: IVC Filter in April . We will schedule you for a 3 month follow up  enhanced non- contrast CT to evaluate ground glass opacities ( Left) Follow up with Dr. Jamison NeighborNestor  in 3 months. Consider follow up Echo in 6 months to evaluate resolution of elevated PAP. Please contact office for sooner follow up if symptoms do not improve or worsen or seek emergency care

## 2015-09-05 NOTE — Patient Instructions (Addendum)
It is nice to meet you today. Great work with your INR. Continue coumadin therapy per Dr. Cyndia BentBadger. Follow up with [facility] re: IVC Filter in April. We will schedule you for a 3 month follow up  enhanced non- contrast CT to evaluate the changes noted in your 08/14/15  CT. Follow up with Dr. Jamison NeighborNestor  in 3 months. Call us sooner if you need us. Please contact office for sooner follow up if symptoms do not improve or worsen or seek emergency care

## 2015-09-05 NOTE — Progress Notes (Signed)
Subjective:    Patient ID: Carla [name], female    DOB: 1921-11-04, 82 y.o.   MRN: [id number]  HPI Carla [name] is a 82 year old female with a history of atrial fibrillation, anemia, hyperlipidemia, and recent  R leg DVT/PE currently being treated with amiodarone and warfarin with goal INR of 2-3. She has a history of GI bleed,hypothyroidism.  Significant events/procedures:   Admit date: 08/15/2015 Discharge date: 08/22/2015 Discharge Diagnoses:  Principal Problem:  Acute respiratory failure with hypoxia [facility]) Active Problems:  Atrial fibrillation (HCC)  Adult hypothyroidism  Hypothyroid  Pulmonary embolism (HCC)  Elevated troponin level  Elevated brain natriuretic peptide (BNP) level  Pressure ulcer  Acute pulmonary embolism (HCC) with rt heart strin on CT  Pulmonary embolism with acute cor pulmonale (HCC)  Hypoxemia  08/15/15:  CT Angio Chest:  IMPRESSION: Positive for acute PE with CT evidence of right heart strain (RV/LV Ratio = 1.5) consistent with at least submassive (intermediate risk) PE. The presence of right heart strain has been associated with an increased risk of morbidity and mortality.   Sizable hiatal hernia with most of the stomach above the diaphragm.  Ground-glass opacities on the left, largest measuring 1.2 x 0.8 cm. Followup noncontrast enhanced CT in 3 months advised to assess for stability. It should be noted that atypical neoplasm could present in this manner.  There are foci of coronary artery calcification. There is left ventricular hypertrophy. No demonstrable adenopathy.   Echo: 08/16/15:Study Conclusions  - Left ventricle: The cavity size was normal. There was mild focal basal hypertrophy of the septum. Systolic function was normal. The estimated ejection fraction was in the range of 50% to 55%. Wall motion was normal; there were no regional wall motion abnormalities. Doppler parameters are consistent with  abnormal left ventricular relaxation (grade 1 diastolic dysfunction). - Aortic valve: There was trivial regurgitation. - Mitral valve: Calcified annulus. There was mild regurgitation. - Pulmonary arteries: Systolic pressure was mildly increased. PA peak pressure: 34 mm Hg (S).  Impressions:  - Normal LV systolic function; grade 1 diastolic dysfunction; trace AI; mild MR and TR; mildly elevated pulmonary pressure.   Warfarin/INR Management by Dr. Cyndia Bent, PCP.  09/05/15: Follow Up Hospital Office Visit:   Carla [name] presents to the office today with her daughter. She is looking well, and states she feels better. She denies chest pain , hemoptysis , shortness of breath or palpitations. She is no longer using oxygen. Dr. Cyndia Bent, the patient's primary care physician, is managing INR monitoring and warfarin dosing. Goal is INR 2-3. Patient's most recent INR today was 2.7. She is due for monitoring again Monday, 09/08/2015. She has resolving  trace edema in her right leg, about 1+. She has no leg pain or tenderness.She is managing on her amiodarone and had a follow up with Dr. Swaziland 09/01/15.    Current outpatient prescriptions:  .  acetaminophen (TYLENOL) 650 MG CR tablet, Take 650 mg by mouth 2 (two) times daily., Disp: , Rfl:  .  amiodarone (PACERONE) 200 MG tablet, TAKE 1/2 TABLET BY MOUTH DAILY (Patient taking differently: Take 100 mg by mouth daily.), Disp: 45 tablet, Rfl: 2 .  Calcium Carb-Cholecalciferol (CALCIUM-VITAMIN D) 600-400 MG-UNIT TABS, Take 1 tablet by mouth 2 (two) times daily. , Disp: , Rfl:  .  docusate sodium (COLACE) 100 MG capsule, Take 100 mg by mouth daily as needed (constipation). , Disp: , Rfl:  .  levothyroxine (SYNTHROID, LEVOTHROID) 75 MCG tablet, Take 75 mcg by mouth daily  before breakfast., Disp: , Rfl:  .  nitrofurantoin (MACRODANTIN) 50 MG capsule, Take 50 mg by mouth., Disp: , Rfl:  .  pantoprazole (PROTONIX) 40 MG tablet, Take 40 mg by mouth daily., Disp:  , Rfl:  .  warfarin (COUMADIN) 1 MG tablet, Take 2 tablets (2 mg total) by mouth one time only at 6 PM., Disp: 30 tablet, Rfl: 0   Past Medical History  Diagnosis Date  . Iron deficiency anemia   . High cholesterol   . Osteoarthritis   . Female bladder prolapse   . Osteoporosis   . Cataracts, bilateral   . Gallstones   . Hemorrhoids   . Diverticulosis   . Hiatal hernia   . Atrial fibrillation (HCC)   . Cystocele   . Rectocele   . Scoliosis   . Lordosis   . Hyperlipidemia   . Osteoarthritis   . Dysrhythmia     A- fib  . Pneumonia     hx aspiration pneumonia  . Hypothyroidism   . Chronic kidney disease     chronic kidney disease  . Encounter for fitting and adjustment of pessary 04/23/2002  . Renal insufficiency     Allergies  Allergen Reactions  . Bactrim [Sulfamethoxazole-Trimethoprim] Other (See Comments)    Has chronic kidney disease.  This is not recommended by nephrologist.    . Nsaids Other (See Comments)    Has chronic kidney disease.  This is not recommended by nephrologist.    Review of Systems Constitutional:   No  weight loss, night sweats,  Fevers, chills, fatigue, or  lassitude.  HEENT:   No headaches,  Difficulty swallowing,  Tooth/dental problems, or  Sore throat,                No sneezing, itching, ear ache, nasal congestion, post nasal drip,   CV:  No chest pain,  Orthopnea, PND, trace swelling in lower extremities, anasarca, dizziness, palpitations, syncope.   GI  No heartburn, indigestion, abdominal pain, nausea, vomiting, diarrhea, change in bowel habits, loss of appetite, bloody stools.   Resp: + shortness of breath with exertion not at rest.  No excess mucus, no productive cough,  No non-productive cough,  No coughing up of blood.  No change in color of mucus.  No wheezing.  No chest wall deformity  Skin: no rash or lesions.  GU: no dysuria, change in color of urine, no urgency or frequency.  No flank pain, no hematuria   MS:  No joint  pain or swelling.  No decreased range of motion.  No back pain.  Psych:  No change in mood or affect. No depression or anxiety.  No memory loss.        Objective:   Physical Exam  BP 148/78 mmHg  Pulse 83  Ht 5' 2.5" (1.588 m)  Wt 171 lb (77.565 kg)  BMI 30.76 kg/m2  SpO2 95%  Physical Exam:  General- No distress,  A&Ox3, elderly female using a walker. ENT: No sinus tenderness, TM clear, pale nasal mucosa, no oral exudate,no post nasal drip, no LAN Cardiac: S1, S2, regular rate and rhythm, no murmur, no click Chest: No wheeze/ rales/ dullness; no accessory muscle use, no nasal flaring, no sternal retractions Abd.: Soft Non-tender Ext: No clubbing cyanosis, 1+edema per right leg/foot Neuro:  normal strength Skin: No rashes, warm and dry Psych: normal mood and behavior      Bevelyn NgoSarah F. Rheba Diamond, AGACNP 09/05/2015 Assessment & Plan:

## 2015-09-08 ENCOUNTER — Other Ambulatory Visit (HOSPITAL_COMMUNITY): Payer: Self-pay | Admitting: Interventional Radiology

## 2015-09-08 DIAGNOSIS — I2699 Other pulmonary embolism without acute cor pulmonale: Secondary | ICD-10-CM

## 2015-10-07 ENCOUNTER — Telehealth: Payer: Self-pay | Admitting: *Deleted

## 2015-10-07 NOTE — Telephone Encounter (Signed)
Patient Daughter Corrie DandyMary called to cancel the appointment for eval of IVC filter. She states that Dr. Hines-Hematology feels the IVC filter should remain in place. The patient is on coumadin.

## 2015-10-23 ENCOUNTER — Other Ambulatory Visit: Payer: Medicare Other

## 2015-11-14 ENCOUNTER — Telehealth: Payer: Self-pay | Admitting: Nurse Practitioner

## 2015-11-14 NOTE — Telephone Encounter (Signed)
Patient had recent PE and is now on coumadin.  Called to wish her Happy Mother's day and check her status.  States she is feeling well and no other new problems.  She is alert and seems to be mentally clear during the telephone visit.

## 2015-12-03 ENCOUNTER — Ambulatory Visit (INDEPENDENT_AMBULATORY_CARE_PROVIDER_SITE_OTHER)
Admission: RE | Admit: 2015-12-03 | Discharge: 2015-12-03 | Disposition: A | Discharge status: Home / Self Care | Payer: Medicare Other | Source: Ambulatory Visit | Attending: Pulmonary Disease | Admitting: Pulmonary Disease

## 2015-12-03 DIAGNOSIS — I2609 Other pulmonary embolism with acute cor pulmonale: Secondary | ICD-10-CM

## 2015-12-08 ENCOUNTER — Inpatient Hospital Stay: Admission: RE | Admit: 2015-12-08 | Payer: Medicare Other | Source: Ambulatory Visit

## 2015-12-08 ENCOUNTER — Ambulatory Visit: Payer: Medicare Other | Admitting: Pulmonary Disease

## 2015-12-09 ENCOUNTER — Encounter: Payer: Self-pay | Admitting: Pulmonary Disease

## 2015-12-09 ENCOUNTER — Telehealth: Payer: Self-pay | Admitting: Pulmonary Disease

## 2015-12-09 ENCOUNTER — Ambulatory Visit (INDEPENDENT_AMBULATORY_CARE_PROVIDER_SITE_OTHER): Payer: Medicare Other | Admitting: Pulmonary Disease

## 2015-12-09 ENCOUNTER — Ambulatory Visit: Payer: Medicare Other | Admitting: Pulmonary Disease

## 2015-12-09 VITALS — BP 118/86 | HR 78 | Wt 172.0 lb

## 2015-12-09 DIAGNOSIS — R0981 Nasal congestion: Secondary | ICD-10-CM | POA: Diagnosis not present

## 2015-12-09 DIAGNOSIS — I2699 Other pulmonary embolism without acute cor pulmonale: Secondary | ICD-10-CM | POA: Diagnosis not present

## 2015-12-09 DIAGNOSIS — R918 Other nonspecific abnormal finding of lung field: Secondary | ICD-10-CM | POA: Diagnosis not present

## 2015-12-09 NOTE — Telephone Encounter (Signed)
IMAGING CT CHEST W/O 12/03/15 (personally reviewed by me): Glass opacity left upper lobe now measuring 1.1 x 0.7 cm. Groundglass nodule in anterior segment of left upper lobe measures 1.1 x 0.8 cm now. No new nodules or opacities. Calcified granuloma in left lung base unchanged. No pleural effusion or thickening. No pericardial effusion. No pathologic mediastinal adenopathy. Hiatal hernia noted.  CTA CHEST 08/15/15 (per radiologist): Consult pulmonary emboli arising from each Main pulmonary artery extending into upper & proximal lower lobe pulmonary arteries bilaterally. Embolism larger on the right than the left. RV/LV ratio 1.5. No thoracic aortic aneurysm or dissection. LVH noted. Calcified granuloma lateral left base. Groundglass nodule anterior segment left upper lobe measuring 0.8 x 0.7 cm with similar groundglass nodule left upper lobe measuring 1.2 x 0.8 cm. Small sliding type hiatal hernia.  VENOUS DUPLEX (08/16/15): Acute DVT involving right common femoral vein with mobile thrombus, right popliteal vein, & right gastrocnemius veins. No left lower extremity DVT.  CARDIAC TTE (08/16/15): Mild focal basal septal hypertrophy. EF 50-55%. Normal regional wall motion. Grade 1 diastolic dysfunction. LA & RA normal in size. RV normal in size and function. Pulmonary artery systolic pressure 34 mmHg. Trivial aortic regurgitation without stenosis. Aortic root normal in size. Mild mitral regurgitation without stenosis. No pulmonic stenosis. Mild tricuspid regurgitation. No pericardial effusion.

## 2015-12-09 NOTE — Patient Instructions (Signed)
   Call me if you have any new breathing problems before your next appointment.  I would recommend Dr. Suszanne Connerseoh with ENT.  We will see you back in February 2018 after your CT scan or sooner if needed.  TESTS ORDERED: 1. CT CHEST W/O February 2018

## 2015-12-09 NOTE — Progress Notes (Signed)
Subjective:    Patient ID: [name], female    DOB: 1921-12-03, 82 y.o.   MRN: [id number]  C.C.:  Follow-up for Bilateral PE & LUL Nodules.  HPI Bilateral PE: Found on CT angiogram of the chest 08/15/15. Venous duplex revealed right lower extremity mobile clot. Patient placed on systemic anticoagulation with Coumadin & IVC filter was placed. She was seen by Hematology [facility] Dr. Kennith [place]) and recommended to leave in her IVC filter and discontinued Coumadin after 3 months of treatment (May). The patient had no hematuria but did have hematochezia during one episode only. Patient still having dyspnea on exertion. Patient is ambulatory at home but is in her recliner for several hours at a time at home routinely. She denies any recurrent cough.  LUL Nodules: Noted on CT imaging February 2017. No evidence of progression on CT imaging from May 2017.  Review of Systems No chest pain or pressure. No fever, chills, or sweats. No syncope or near syncope.   Allergies  Allergen Reactions  . Bactrim [Sulfamethoxazole-Trimethoprim] Other (See Comments)    Has chronic kidney disease.  This is not recommended by nephrologist.    . Nsaids Other (See Comments)    Has chronic kidney disease.  This is not recommended by nephrologist.    Current Outpatient Prescriptions on File Prior to Visit  Medication Sig Dispense Refill  . acetaminophen (TYLENOL) 650 MG CR tablet Take 650 mg by mouth 2 (two) times daily.    Marland Kitchen amiodarone (PACERONE) 200 MG tablet TAKE 1/2 TABLET BY MOUTH DAILY (Patient taking differently: Take 100 mg by mouth daily.) 45 tablet 2  . Calcium Carb-Cholecalciferol (CALCIUM-VITAMIN D) 600-400 MG-UNIT TABS Take 1 tablet by mouth 2 (two) times daily.     Marland Kitchen docusate sodium (COLACE) 100 MG capsule Take 100 mg by mouth daily as needed (constipation).     Marland Kitchen levothyroxine (SYNTHROID, LEVOTHROID) 75 MCG tablet Take 75 mcg by mouth daily before breakfast.    . nitrofurantoin (MACRODANTIN) 50 MG  capsule Take 50 mg by mouth.    . pantoprazole (PROTONIX) 40 MG tablet Take 40 mg by mouth daily.    Marland Kitchen warfarin (COUMADIN) 1 MG tablet Take 2 tablets (2 mg total) by mouth one time only at 6 PM. (Patient not taking: Reported on 12/09/2015) 30 tablet 0   No current facility-administered medications on file prior to visit.    Past Medical History  Diagnosis Date  . Iron deficiency anemia   . High cholesterol   . Osteoarthritis   . Female bladder prolapse   . Osteoporosis   . Cataracts, bilateral   . Gallstones   . Hemorrhoids   . Diverticulosis   . Hiatal hernia   . Atrial fibrillation (HCC)   . Cystocele   . Rectocele   . Scoliosis   . Lordosis   . Hyperlipidemia   . Osteoarthritis   . Dysrhythmia     A- fib  . Pneumonia     hx aspiration pneumonia  . Hypothyroidism   . Chronic kidney disease     chronic kidney disease  . Encounter for fitting and adjustment of pessary 04/23/2002  . Renal insufficiency     Past Surgical History  Procedure Laterality Date  . Cholecystectomy    . Cataract extraction    . Esophagogastroduodenoscopy    . Colonoscopy    . Endometrial biopsy  09/2009  . Esophagogastroduodenoscopy N/A 01/09/2013    Procedure: ESOPHAGOGASTRODUODENOSCOPY (EGD);  Surgeon: Vertell Novak., MD;  Location: [facility];  Service: Endoscopy;  Laterality: N/A;  . Rectal ultrasound N/A 04/10/2014    Procedure: RECTAL ULTRASOUND;  Surgeon: Romie LeveeAlicia Thomas, MD;  Location: [facility];  Service: Endoscopy;  Laterality: N/A;    Family History  Problem Relation Age of Onset  . Breast cancer Paternal Aunt   . Breast cancer Paternal Aunt   . Cancer      several family members on dad's side of family  . Diabetes Father   . Leukemia Father   . Heart failure Brother     Pt cannot confirm this history    Social History   Social History  . Marital Status: Widowed    Spouse Name: N/A  . Number of Children: 4  . Years of Education: N/A   Occupational History  .  Retired Chartered loss adjusterschoolteacher    Social History Main Topics  . Smoking status: Never Smoker   . Smokeless tobacco: Never Used  . Alcohol Use: No  . Drug Use: No  . Sexual Activity: No   Other Topics Concern  . None   Social History Narrative   Widowed.  Lives alone.  Uses a cane/walker to ambulate but independent of ADLs.      Objective:   Physical Exam BP 118/86 mmHg  Pulse 78  Wt 172 lb (78.019 kg)  SpO2 94% General:  Awake. Alert. No acute distress. Daughter at bedside.  Integument:  Warm & dry. No rash on exposed skin. Venous stasis changes in feet bilaterally.  HEENT:  Moist mucus membranes. No oral ulcers. Moderate bilateral nasal turbinate swelling. Cardiovascular:  Regular rate. No edema. Bilateral pedal edema.  Pulmonary:  Good aeration & clear to auscultation bilaterally with symmetrically decreased breath sounds. Normal work of breathing on room air. Abdomen: Soft. Normal bowel sounds. Nondistended.  Musculoskeletal:  Normal bulk and tone. No joint deformity or effusion appreciated.  WALK TEST 12/09/15:  Walked 2 laps / Nadir Sat 98% on RA  IMAGING CT CHEST W/O 12/03/15 (personally reviewed by me): Glass opacity left upper lobe now measuring 1.1 x 0.7 cm. Groundglass nodule in anterior segment of left upper lobe measures 1.1 x 0.8 cm now. No new nodules or opacities. Calcified granuloma in left lung base unchanged. No pleural effusion or thickening. No pericardial effusion. No pathologic mediastinal adenopathy. Hiatal hernia noted.  CTA CHEST 08/15/15 (per radiologist): Consult pulmonary emboli arising from each Main pulmonary artery extending into upper & proximal lower lobe pulmonary arteries bilaterally. Embolism larger on the right than the left. RV/LV ratio 1.5. No thoracic aortic aneurysm or dissection. LVH noted. Calcified granuloma lateral left base. Groundglass nodule anterior segment left upper lobe measuring 0.8 x 0.7 cm with similar groundglass nodule left upper lobe  measuring 1.2 x 0.8 cm. Small sliding type hiatal hernia.  VENOUS DUPLEX (08/16/15): Acute DVT involving right common femoral vein with mobile thrombus, right popliteal vein, & right gastrocnemius veins. No left lower extremity DVT.  CARDIAC TTE (08/16/15): Mild focal basal septal hypertrophy. EF 50-55%. Normal regional wall motion. Grade 1 diastolic dysfunction. LA & RA normal in size. RV normal in size and function. Pulmonary artery systolic pressure 34 mmHg. Trivial aortic regurgitation without stenosis. Aortic root normal in size. Mild mitral regurgitation without stenosis. No pulmonic stenosis. Mild tricuspid regurgitation. No pericardial effusion.    Assessment & Plan:  82 year old female with extensive bilateral pulmonary emboli in February 2017. Likely secondary to sedentary lifestyle. Since last appointment patient was taken off of systemic anticoagulation after  3 months of therapy. Continues to have IVC filter in place. We had a lengthy discussion today regarding the potential complications of leaving her IVC filter in place without systemic anticoagulation. Certainly with her bleeding episode on systemic anticoagulation I would be hesitant to restart. The patient is going to further discuss plans for her IVC filter with hematology. We also reviewed her chest CT scan which does show continued left upper lobe nodules. The patient wishes to continue monitoring with CT imaging. She also reports some wheezing and intermittent sinus congestion. She is requesting evaluation by ENT. I instructed the patient contact my office if she had any new breathing problems or questions before next appointment.  1. Left upper lobe lung nodule: Plan for repeat CT chest without contrast February 2018. 2. Bilateral pulmonary emboli: IVC filter still in place. Patient has follow-up with hematology. Currently off anticoagulation. 3. Sinus congestion: Referring to ENT for further evaluation and treatment  recommendation. 4. Follow-up: Patient to return to clinic in February 2018 or sooner if needed.  Donna Christen Jamison Neighbor, M.D. [facility] Pager:  [phone] After 3pm or if no response, call [phone] 12:58 PM 12/09/2015

## 2015-12-12 ENCOUNTER — Inpatient Hospital Stay (HOSPITAL_COMMUNITY)
Admission: EM | Admit: 2015-12-12 | Discharge: 2015-12-16 | DRG: 481 | Disposition: A | Discharge status: Skilled Nursing Facility | Payer: Medicare Other | Attending: Internal Medicine | Admitting: Internal Medicine

## 2015-12-12 ENCOUNTER — Emergency Department (HOSPITAL_COMMUNITY): Payer: Medicare Other

## 2015-12-12 ENCOUNTER — Encounter (HOSPITAL_COMMUNITY): Payer: Self-pay

## 2015-12-12 DIAGNOSIS — N189 Chronic kidney disease, unspecified: Secondary | ICD-10-CM | POA: Diagnosis present

## 2015-12-12 DIAGNOSIS — Z6832 Body mass index (BMI) 32.0-32.9, adult: Secondary | ICD-10-CM

## 2015-12-12 DIAGNOSIS — Z881 Allergy status to other antibiotic agents status: Secondary | ICD-10-CM

## 2015-12-12 DIAGNOSIS — Z888 Allergy status to other drugs, medicaments and biological substances status: Secondary | ICD-10-CM | POA: Diagnosis not present

## 2015-12-12 DIAGNOSIS — D72829 Elevated white blood cell count, unspecified: Secondary | ICD-10-CM | POA: Diagnosis present

## 2015-12-12 DIAGNOSIS — W010XXA Fall on same level from slipping, tripping and stumbling without subsequent striking against object, initial encounter: Secondary | ICD-10-CM | POA: Diagnosis present

## 2015-12-12 DIAGNOSIS — I272 Other secondary pulmonary hypertension: Secondary | ICD-10-CM | POA: Diagnosis present

## 2015-12-12 DIAGNOSIS — Z833 Family history of diabetes mellitus: Secondary | ICD-10-CM

## 2015-12-12 DIAGNOSIS — Z419 Encounter for procedure for purposes other than remedying health state, unspecified: Secondary | ICD-10-CM

## 2015-12-12 DIAGNOSIS — Z86711 Personal history of pulmonary embolism: Secondary | ICD-10-CM | POA: Diagnosis not present

## 2015-12-12 DIAGNOSIS — S72101A Unspecified trochanteric fracture of right femur, initial encounter for closed fracture: Secondary | ICD-10-CM

## 2015-12-12 DIAGNOSIS — Z806 Family history of leukemia: Secondary | ICD-10-CM | POA: Diagnosis not present

## 2015-12-12 DIAGNOSIS — Z8249 Family history of ischemic heart disease and other diseases of the circulatory system: Secondary | ICD-10-CM

## 2015-12-12 DIAGNOSIS — N179 Acute kidney failure, unspecified: Secondary | ICD-10-CM | POA: Diagnosis not present

## 2015-12-12 DIAGNOSIS — D62 Acute posthemorrhagic anemia: Secondary | ICD-10-CM | POA: Diagnosis not present

## 2015-12-12 DIAGNOSIS — W19XXXA Unspecified fall, initial encounter: Secondary | ICD-10-CM

## 2015-12-12 DIAGNOSIS — E785 Hyperlipidemia, unspecified: Secondary | ICD-10-CM | POA: Diagnosis present

## 2015-12-12 DIAGNOSIS — Y92009 Unspecified place in unspecified non-institutional (private) residence as the place of occurrence of the external cause: Secondary | ICD-10-CM | POA: Diagnosis not present

## 2015-12-12 DIAGNOSIS — Z91041 Radiographic dye allergy status: Secondary | ICD-10-CM | POA: Diagnosis not present

## 2015-12-12 DIAGNOSIS — M419 Scoliosis, unspecified: Secondary | ICD-10-CM | POA: Diagnosis present

## 2015-12-12 DIAGNOSIS — Z79899 Other long term (current) drug therapy: Secondary | ICD-10-CM | POA: Diagnosis not present

## 2015-12-12 DIAGNOSIS — Z86718 Personal history of other venous thrombosis and embolism: Secondary | ICD-10-CM | POA: Diagnosis not present

## 2015-12-12 DIAGNOSIS — S72141A Displaced intertrochanteric fracture of right femur, initial encounter for closed fracture: Secondary | ICD-10-CM | POA: Diagnosis present

## 2015-12-12 DIAGNOSIS — Z09 Encounter for follow-up examination after completed treatment for conditions other than malignant neoplasm: Secondary | ICD-10-CM

## 2015-12-12 DIAGNOSIS — M810 Age-related osteoporosis without current pathological fracture: Secondary | ICD-10-CM | POA: Diagnosis present

## 2015-12-12 DIAGNOSIS — E860 Dehydration: Secondary | ICD-10-CM | POA: Diagnosis not present

## 2015-12-12 DIAGNOSIS — Z7901 Long term (current) use of anticoagulants: Secondary | ICD-10-CM | POA: Diagnosis not present

## 2015-12-12 DIAGNOSIS — D509 Iron deficiency anemia, unspecified: Secondary | ICD-10-CM | POA: Diagnosis present

## 2015-12-12 DIAGNOSIS — Z886 Allergy status to analgesic agent status: Secondary | ICD-10-CM | POA: Diagnosis not present

## 2015-12-12 DIAGNOSIS — Z803 Family history of malignant neoplasm of breast: Secondary | ICD-10-CM

## 2015-12-12 DIAGNOSIS — Z87440 Personal history of urinary (tract) infections: Secondary | ICD-10-CM

## 2015-12-12 DIAGNOSIS — E7800 Pure hypercholesterolemia, unspecified: Secondary | ICD-10-CM | POA: Diagnosis present

## 2015-12-12 DIAGNOSIS — I4891 Unspecified atrial fibrillation: Secondary | ICD-10-CM | POA: Diagnosis present

## 2015-12-12 DIAGNOSIS — E039 Hypothyroidism, unspecified: Secondary | ICD-10-CM | POA: Diagnosis present

## 2015-12-12 DIAGNOSIS — S72142A Displaced intertrochanteric fracture of left femur, initial encounter for closed fracture: Secondary | ICD-10-CM

## 2015-12-12 DIAGNOSIS — I480 Paroxysmal atrial fibrillation: Secondary | ICD-10-CM | POA: Diagnosis not present

## 2015-12-12 DIAGNOSIS — S7291XA Unspecified fracture of right femur, initial encounter for closed fracture: Secondary | ICD-10-CM | POA: Diagnosis present

## 2015-12-12 DIAGNOSIS — M25551 Pain in right hip: Secondary | ICD-10-CM | POA: Diagnosis present

## 2015-12-12 DIAGNOSIS — E038 Other specified hypothyroidism: Secondary | ICD-10-CM | POA: Diagnosis not present

## 2015-12-12 LAB — BASIC METABOLIC PANEL
ANION GAP: 6 (ref 5–15)
BUN: 21 mg/dL — ABNORMAL HIGH (ref 6–20)
CALCIUM: 8.5 mg/dL — AB (ref 8.9–10.3)
CO2: 28 mmol/L (ref 22–32)
CREATININE: 1.35 mg/dL — AB (ref 0.44–1.00)
Chloride: 104 mmol/L (ref 101–111)
GFR, EST AFRICAN AMERICAN: 39 mL/min — AB (ref >60)
GFR, EST NON AFRICAN AMERICAN: 33 mL/min — AB (ref >60)
Glucose, Bld: 154 mg/dL — ABNORMAL HIGH (ref 65–99)
Potassium: 4.3 mmol/L (ref 3.5–5.1)
Sodium: 138 mmol/L (ref 135–145)

## 2015-12-12 LAB — CBC WITH DIFFERENTIAL/PLATELET
BASOS ABS: 0 10*3/uL (ref 0.0–0.1)
Basophils Relative: 0 %
Eosinophils Absolute: 0.1 10*3/uL (ref 0.0–0.7)
Eosinophils Relative: 0 %
HEMATOCRIT: 35.5 % — AB (ref 36.0–46.0)
HEMOGLOBIN: 11.1 g/dL — AB (ref 12.0–15.0)
LYMPHS ABS: 1.9 10*3/uL (ref 0.7–4.0)
LYMPHS PCT: 13 %
MCH: 24.9 pg — AB (ref 26.0–34.0)
MCHC: 31.3 g/dL (ref 30.0–36.0)
MCV: 79.6 fL (ref 78.0–100.0)
Monocytes Absolute: 1.6 10*3/uL — ABNORMAL HIGH (ref 0.1–1.0)
Monocytes Relative: 11 %
NEUTROS ABS: 10.7 10*3/uL — AB (ref 1.7–7.7)
NEUTROS PCT: 76 %
Platelets: 294 10*3/uL (ref 150–400)
RBC: 4.46 MIL/uL (ref 3.87–5.11)
RDW: 16.4 % — ABNORMAL HIGH (ref 11.5–15.5)
WBC: 14.3 10*3/uL — AB (ref 4.0–10.5)

## 2015-12-12 LAB — PROTIME-INR
INR: 0.91 (ref 0.00–1.49)
Prothrombin Time: 12.5 seconds (ref 11.6–15.2)

## 2015-12-12 MED ORDER — MORPHINE SULFATE (PF) 4 MG/ML IV SOLN
4.0000 mg | Freq: Once | INTRAVENOUS | Status: AC
  Administered 2015-12-12: 4 mg via INTRAVENOUS
  Filled 2015-12-12: qty 1

## 2015-12-12 MED ORDER — ONDANSETRON HCL 4 MG/2ML IJ SOLN
4.0000 mg | Freq: Once | INTRAMUSCULAR | Status: AC
  Administered 2015-12-12: 4 mg via INTRAVENOUS
  Filled 2015-12-12: qty 2

## 2015-12-12 NOTE — ED Notes (Signed)
Per EMS- Pt fell while bending over for newspaper, slipped and fell on bottom. External rotation and shortening of right leg, pain in right hip area.

## 2015-12-12 NOTE — Consult Note (Signed)
ORTHOPAEDIC CONSULTATION  REQUESTING PHYSICIAN: Jacalyn Lefevre, MD  PCP:  [name], MD  Chief Complaint: Right hip fracture  HPI: Carla [name] is a 82 y.o. female with multiple medical problems including recent treatment for bilateral pulmonary embolism who spent 3 months on Coumadin and has an IVC filter. She was recently taken off of the Coumadin, and she is not on any anticoagulation. She slipped and fell on her but at home earlier today and injured her right hip. She has right hip pain and inability to weight-bear. She was brought to the emergency department at [facility] where x-rays revealed a right intertrochanteric femur fracture. Orthopedic consultation was placed for management of her fracture. She denies other injuries.  Past Medical History  Diagnosis Date  . Iron deficiency anemia   . High cholesterol   . Osteoarthritis   . Female bladder prolapse   . Osteoporosis   . Cataracts, bilateral   . Gallstones   . Hemorrhoids   . Diverticulosis   . Hiatal hernia   . Atrial fibrillation (HCC)   . Cystocele   . Rectocele   . Scoliosis   . Lordosis   . Hyperlipidemia   . Osteoarthritis   . Dysrhythmia     A- fib  . Pneumonia     hx aspiration pneumonia  . Hypothyroidism   . Chronic kidney disease     chronic kidney disease  . Encounter for fitting and adjustment of pessary 04/23/2002  . Renal insufficiency    Past Surgical History  Procedure Laterality Date  . Cholecystectomy    . Cataract extraction    . Esophagogastroduodenoscopy    . Colonoscopy    . Endometrial biopsy  09/2009  . Esophagogastroduodenoscopy N/A 01/09/2013    Procedure: ESOPHAGOGASTRODUODENOSCOPY (EGD);  Surgeon: Vertell Novak., MD;  Location: [facility];  Service: Endoscopy;  Laterality: N/A;  . Rectal ultrasound N/A 04/10/2014    Procedure: RECTAL ULTRASOUND;  Surgeon: Romie Levee, MD;  Location: [facility];  Service: Endoscopy;  Laterality: N/A;   Social History    Social History  . Marital Status: Widowed    Spouse Name: N/A  . Number of Children: 4  . Years of Education: N/A   Occupational History  . Retired Chartered loss adjuster    Social History Main Topics  . Smoking status: Never Smoker   . Smokeless tobacco: Never Used  . Alcohol Use: No  . Drug Use: No  . Sexual Activity: No   Other Topics Concern  . None   Social History Narrative   Widowed.  Lives alone.  Uses a cane/walker to ambulate but independent of ADLs.   Family History  Problem Relation Age of Onset  . Breast cancer Paternal Aunt   . Breast cancer Paternal Aunt   . Cancer      several family members on dad's side of family  . Diabetes Father   . Leukemia Father   . Heart failure Brother     Pt cannot confirm this history   Allergies  Allergen Reactions  . Bactrim [Sulfamethoxazole-Trimethoprim] Other (See Comments)    Has chronic kidney disease.  This is not recommended by nephrologist.    . Ardine Bjork [Iodinated Diagnostic Agents]   . Nsaids Other (See Comments)    Has chronic kidney disease.  This is not recommended by nephrologist.   Prior to Admission medications   Medication Sig Start Date End Date Taking? Authorizing Provider  acetaminophen (TYLENOL) 650 MG CR tablet Take 650  mg by mouth 2 (two) times daily.    Historical Provider, MD  amiodarone (PACERONE) 200 MG tablet TAKE 1/2 TABLET BY MOUTH DAILY Patient taking differently: Take 100 mg by mouth daily. 08/13/15   Peter M SwazilandJordan, MD  Calcium Carb-Cholecalciferol (CALCIUM-VITAMIN D) 600-400 MG-UNIT TABS Take 1 tablet by mouth 2 (two) times daily.     Historical Provider, MD  docusate sodium (COLACE) 100 MG capsule Take 100 mg by mouth daily as needed (constipation).     Historical Provider, MD  levothyroxine (SYNTHROID, LEVOTHROID) 75 MCG tablet Take 75 mcg by mouth daily before breakfast.    Historical Provider, MD  nitrofurantoin (MACRODANTIN) 50 MG capsule Take 50 mg by mouth. 12/27/14   Historical Provider,  MD  pantoprazole (PROTONIX) 40 MG tablet Take 40 mg by mouth daily. 01/10/13   Dorothea OgleIskra M Myers, MD  warfarin (COUMADIN) 1 MG tablet Take 2 tablets (2 mg total) by mouth one time only at 6 PM. Patient not taking: Reported on 12/09/2015 08/22/15   Marinda ElkAbraham Feliz Ortiz, MD   No results found.  Positive ROS: All other systems have been reviewed and were otherwise negative with the exception of those mentioned in the HPI and as above.  Physical Exam: General: Alert, no acute distress Cardiovascular: No pedal edema Respiratory: No cyanosis, no use of accessory musculature GI: No organomegaly, abdomen is soft and non-tender Skin: No lesions in the area of chief complaint Neurologic: Sensation intact distally Psychiatric: Patient is competent for consent with normal mood and affect Lymphatic: No axillary or cervical lymphadenopathy  MUSCULOSKELETAL: Right lower extremity is shortened and externally rotated. Her skin is intact. She has pain with attempted logrolling of the hip. She has a 1+ DP pulse. She can wiggle her toes. She reports intact sensation.  Assessment: Right intertrochanteric femur fracture  Plan: I discussed the findings with the patient. She has a displaced right intertrochanteric femur fracture, and I recommended intramedullary fixation. We discussed the risks, benefits, and alternatives. She has an increased risk for perioperative medical complications given her recent pulmonary embolism and multiple medical problems. Luckily she has an IVC filter in place. We will plan for surgery in the morning. Nothing by mouth after midnight.     The risks, benefits, and alternatives were discussed with the patient. There are risks associated with the surgery including, but not limited to, problems with anesthesia (death), infection, differences in leg length/angulation/rotation, fracture of bones, loosening or failure of implants, malunion, nonunion, hematoma (blood accumulation) which may  require surgical drainage, blood clots, pulmonary embolism, nerve injury (foot drop), and blood vessel injury. The patient understands these risks and elects to proceed.     Alesandra Smart, Cloyde ReamsBrian James, MD Cell [phone]    12/12/2015 9:37 PM

## 2015-12-12 NOTE — H&P (Addendum)
Carla [name]:[id number] DOB: Nov 19, 1921 DOA: 12/12/2015     PCP: [name], MD   Outpatient Specialists: Cardiology Peter [place], pulmonology, [facility]  Nephrology [place] Patient coming from:   home Lives alone,        Chief Complaint: Fall  HPI: Carla [name] is a 82 y.o. female with medical history significant of A. fib developed the setting of urosepsis treated amiodarone,  Pulmonary hypertension, history of upper GI bleeding, hypothyroidism, right leg DVT, PE in February 2017, status post IVC filter History of recto-vaginal and vesico-vaginal fistulas followed by [facility], recurrent UTIs secondary to fistula  Presented with mechanical Fall today. She was bending over to pick up newspaper and lost balance. Resulting in right hip shortening and rotation Patient was not able to stand up and 911 was called. Patient's pain has been constant. She was brought in to emergency department Patient still endorses a baseline having some mild shortness of breath with activity she is able to get around the house and to the mail box without trouble. no chest pain at rest or with activity.  She is currently not on anticoagulation. Denies head injury or loss of consciousness  Regarding pertinent Chronic problems: Patient had in the past complicated medical history developed an atrial flutter ablation in the setting of urosepsis was treated with amiodarone and subsequently converted to normal sinus rhythm. She developed right leg DVT and PE in [place] 2017 and had IVC filter placed. Patient has history of large hiatal hernia with gastritis at high risk of bleeding. Subsequently had Coumadin has been discontinued after 3 months of treatment. Answer P patient have had chronic dyspnea on exertion. He has known left upper lobe lung nodule will plan to repeat CT chest in [place] 2018  IN ER: Afebrile satting 85% room air after administration of narcotic but  improved with  oxygenation. Plain films showing acute intertrochanteric femur fracture in the right Chest x-ray nonacute At the [facility] has been consulted   Hospitalist was called for admission for right hip intertrochanteric fracture  Review of Systems:    Pertinent positives include: Right hip pain and shortness of breath  Constitutional:  No weight loss, night sweats, Fevers, chills, fatigue, weight loss  HEENT:  No headaches, Difficulty swallowing,Tooth/dental problems,Sore throat,  No sneezing, itching, ear ache, nasal congestion, post nasal drip,  Cardio-vascular:  No chest pain, Orthopnea, PND, anasarca, dizziness, palpitations.no Bilateral lower extremity swelling  GI:  No heartburn, indigestion, abdominal pain, nausea, vomiting, diarrhea, change in bowel habits, loss of appetite, melena, blood in stool, hematemesis Resp:   No excess mucus, no productive cough, No non-productive cough, No coughing up of blood.No change in color of mucus.No wheezing. Skin:  no rash or lesions. No jaundice GU:  no dysuria, change in color of urine, no urgency or frequency. No straining to urinate.  No flank pain.  Musculoskeletal:  No joint pain or no joint swelling. No decreased range of motion. No back pain.  Psych:  No change in mood or affect. No depression or anxiety. No memory loss.  Neuro: no localizing neurological complaints, no tingling, no weakness, no double vision, no gait abnormality, no slurred speech, no confusion  As per HPI otherwise 10 point review of systems negative.   Past Medical History: Past Medical History  Diagnosis Date  . Iron deficiency anemia   . High cholesterol   . Osteoarthritis   . Female bladder prolapse   . Osteoporosis   . Cataracts, bilateral   .  Gallstones   . Hemorrhoids   . Diverticulosis   . Hiatal hernia   . Atrial fibrillation (HCC)   . Cystocele   . Rectocele   . Scoliosis   . Lordosis   . Hyperlipidemia   . Osteoarthritis   . Dysrhythmia       A- fib  . Pneumonia     hx aspiration pneumonia  . Hypothyroidism   . Chronic kidney disease     chronic kidney disease  . Encounter for fitting and adjustment of pessary 04/23/2002  . Renal insufficiency    Past Surgical History  Procedure Laterality Date  . Cholecystectomy    . Cataract extraction    . Esophagogastroduodenoscopy    . Colonoscopy    . Endometrial biopsy  09/2009  . Esophagogastroduodenoscopy N/A 01/09/2013    Procedure: ESOPHAGOGASTRODUODENOSCOPY (EGD);  Surgeon: Vertell Novak., MD;  Location: [facility];  Service: Endoscopy;  Laterality: N/A;  . Rectal ultrasound N/A 04/10/2014    Procedure: RECTAL ULTRASOUND;  Surgeon: Romie Levee, MD;  Location: [facility];  Service: Endoscopy;  Laterality: N/A;     Social History:  Ambulatory  Dan Humphreys     reports that she has never smoked. She has never used smokeless tobacco. She reports that she does not drink alcohol or use illicit drugs.  Allergies:   Allergies  Allergen Reactions  . Bactrim [Sulfamethoxazole-Trimethoprim] Other (See Comments)    Has chronic kidney disease.  This is not recommended by nephrologist.    . Ardine Bjork [Iodinated Diagnostic Agents]   . Nsaids Other (See Comments)    Has chronic kidney disease.  This is not recommended by nephrologist.       Family History:    Family History  Problem Relation Age of Onset  . Breast cancer Paternal Aunt   . Breast cancer Paternal Aunt   . Cancer      several family members on dad's side of family  . Diabetes Father   . Leukemia Father   . Heart failure Brother     Pt cannot confirm this history    Medications: Prior to Admission medications   Medication Sig Start Date End Date Taking? Authorizing Provider  acetaminophen (TYLENOL) 650 MG CR tablet Take 650 mg by mouth daily as needed for pain.    Yes Historical Provider, MD  amiodarone (PACERONE) 200 MG tablet TAKE 1/2 TABLET BY MOUTH DAILY Patient taking differently: Take 100 mg  by mouth daily. 08/13/15  Yes Peter M [place], MD  Calcium Carb-Cholecalciferol (CALCIUM-VITAMIN D) 600-400 MG-UNIT TABS Take 1 tablet by mouth 2 (two) times daily.    Yes Historical Provider, MD  docusate sodium (COLACE) 100 MG capsule Take 100 mg by mouth daily as needed (constipation).    Yes Historical Provider, MD  levothyroxine (SYNTHROID, LEVOTHROID) 75 MCG tablet Take 75 mcg by mouth daily before breakfast.   Yes Historical Provider, MD  nitrofurantoin (MACRODANTIN) 50 MG capsule Take 50 mg by mouth daily.  12/27/14  Yes Historical Provider, MD  pantoprazole (PROTONIX) 40 MG tablet Take 40 mg by mouth daily. 01/10/13  Yes Dorothea Ogle, MD  warfarin (COUMADIN) 1 MG tablet Take 2 tablets (2 mg total) by mouth one time only at 6 PM. Patient not taking: Reported on 12/09/2015 08/22/15   Marinda Elk, MD    Physical Exam: Patient Vitals for the past 24 hrs:  BP Temp Temp src Pulse Resp SpO2  12/12/15 2234 167/72 mmHg - - 82 18 97 %  12/12/15 2029 166/74 mmHg 97.6 F (36.4 C) Oral 68 19 (!) 85 %    1. General:  in No Acute distress 2. Psychological: Alert and   Oriented 3. Head/ENT:   Dry Mucous Membranes                          Head Non traumatic, neck supple                            Poor Dentition 4. SKIN:   decreased Skin turgor,  Skin clean Dry and intact no rash 5. Heart: Regular rate and rhythm no  Murmur, Rub or gallop 6. Lungs:   no wheezes or crackles   7. Abdomen: Soft, non-tender, Non distended 8. Lower extremities: no clubbing, cyanosis, or edema 9. Neurologically Grossly intact, moving all 4 extremities equally 10. MSK: Normal range of motion Range limited due to pain in her right lower extremity   body mass index is unknown because there is no weight on file.  Labs on Admission:   Labs on Admission: I have personally reviewed following labs and imaging studies  CBC:  Recent Labs Lab 12/12/15 2109  WBC 14.3*  NEUTROABS 10.7*  HGB 11.1*  HCT 35.5*  MCV  79.6  PLT 294   Basic Metabolic Panel: No results for input(s): NA, K, CL, CO2, GLUCOSE, BUN, CREATININE, CALCIUM, MG, PHOS in the last 168 hours. GFR: CrCl cannot be calculated (Patient has no serum creatinine result on file.). Liver Function Tests: No results for input(s): AST, ALT, ALKPHOS, BILITOT, PROT, ALBUMIN in the last 168 hours. No results for input(s): LIPASE, AMYLASE in the last 168 hours. No results for input(s): AMMONIA in the last 168 hours. Coagulation Profile:  Recent Labs Lab 12/12/15 2109  INR 0.91   Cardiac Enzymes: No results for input(s): CKTOTAL, CKMB, CKMBINDEX, TROPONINI in the last 168 hours. BNP (last 3 results) No results for input(s): PROBNP in the last 8760 hours. HbA1C: No results for input(s): HGBA1C in the last 72 hours. CBG: No results for input(s): GLUCAP in the last 168 hours. Lipid Profile: No results for input(s): CHOL, HDL, LDLCALC, TRIG, CHOLHDL, LDLDIRECT in the last 72 hours. Thyroid Function Tests: No results for input(s): TSH, T4TOTAL, FREET4, T3FREE, THYROIDAB in the last 72 hours. Anemia Panel: No results for input(s): VITAMINB12, FOLATE, FERRITIN, TIBC, IRON, RETICCTPCT in the last 72 hours. Urine analysis:    Component Value Date/Time   COLORURINE YELLOW 01/08/2013 0759   APPEARANCEUR CLOUDY* 01/08/2013 0759   LABSPEC 1.018 01/08/2013 0759   PHURINE 8.0 01/08/2013 0759   GLUCOSEU NEGATIVE 01/08/2013 0759   HGBUR LARGE* 01/08/2013 0759   BILIRUBINUR NEGATIVE 01/08/2013 0759   KETONESUR NEGATIVE 01/08/2013 0759   PROTEINUR 100* 01/08/2013 0759   UROBILINOGEN 0.2 01/08/2013 0759   NITRITE NEGATIVE 01/08/2013 0759   LEUKOCYTESUR LARGE* 01/08/2013 0759   Sepsis Labs: (procalcitonin:4,lacticidven:4) )No results found for this or any previous visit (from the past 240 hour(s)).       UA ordered  Lab Results  Component Value Date   HGBA1C 6.1* 01/08/2013    CrCl cannot be calculated (Patient has no serum  creatinine result on file.).  BNP (last 3 results) No results for input(s): PROBNP in the last 8760 hours.   ECG REPORT ordered  There were no vitals filed for this visit.   Cultures:    Component Value Date/Time   SDES URINE,  CATHETERIZED 01/08/2013 0759   SPECREQUEST NONE 01/08/2013 0759   CULT PROTEUS MIRABILIS 01/08/2013 0759   REPTSTATUS 01/10/2013 FINAL 01/08/2013 0759     Radiological Exams on Admission: Dg Chest 1 View  12/12/2015  CLINICAL DATA:  Larey SeatFell, hip pain EXAM: CHEST 1 VIEW COMPARISON:  08/15/2015 FINDINGS: Large hiatal hernia. Heart size normal. Elevation of the right diaphragm. Right greater than left shoulder arthropathy. Lungs clear. No effusion. IMPRESSION: Numerous chronic findings with no acute abnormality. Electronically Signed   By: Esperanza Heiraymond  Rubner M.D.   On: 12/12/2015 21:57   Dg Pelvis 1-2 Views  12/12/2015  CLINICAL DATA:  Fall, right hip pain EXAM: PELVIS - 1-2 VIEW COMPARISON:  None. FINDINGS: Intertrochanteric right femur fracture as described in report of right femur. Pelvic bones intact. IMPRESSION: Right femur fracture Electronically Signed   By: Esperanza Heiraymond  Rubner M.D.   On: 12/12/2015 21:58   Dg Femur, Min 2 Views Right  12/12/2015  CLINICAL DATA:  Fall, femur pain EXAM: RIGHT FEMUR 2 VIEWS COMPARISON:  None. FINDINGS: Mildly impacted intertrochanteric right femur fracture with mild varus angulation. Extensive femoral artery calcification. IMPRESSION: Acute femur fracture Electronically Signed   By: Esperanza Heiraymond  Rubner M.D.   On: 12/12/2015 21:58    Chart has been reviewed    Assessment/Plan  82 y.o. female with medical history significant of A. fib developed the setting of urosepsis treated amiodarone,  Pulmonary hypertension, history of upper GI bleeding, hypothyroidism, right leg DVT, PE in February 2017, status post IVC filter History of recto-vaginal and vesico-vaginal fistulas followed by [facility] here with right intertrochanteric hip fracture  orthopedics aware plant to operate in AM  Present on Admission:  . Closed right femoral fracture (HCC)- as per orthopedics. Plan for intramedullary fixation in a.m. patient is moderate to increased risk secondary to recent PE will keep nothing by mouth post midnight. Given good exercise tolerance no further cardiac workup indicated prior to proceeding  . Atrial fibrillation (HCC)- currently back to sinus rhythm CHA2DS2 vasc score 4 but given history of multiple medical problems and in the past findings of gastritis due to risk of bleeding patient not on anticoagulation  . Leukocytosis to stress related but will obtain UA to evaluate for any evidence of urinary tract infection patient has history of recurrent UTIs secondary to fistula formation but so far has been asymptomatic   history of PE  - currently not on anticoagulation , continues to have some shortness of breath which is likely residual. Keep on oxygen this patient becomes hypoxic when oversedated.  . Hypothyroid and tinea home medications currently stable  Other plan as per orders.  DVT prophylaxis:  SCD   Code Status:  FULL CODE as per patient    Family Communication:   Family   at  Bedside  plan of care was discussed with  Daughter Stevphen MeuseMary Gilliam [phone]  Disposition Plan:    likely will need placement for rehabilitation                     Consults called: Orthopedics [place]  Admission status:    inpatient      Level of care     tele     Caydan Mctavish 12/12/2015, 11:41 PM    [facility]  Pager [phone]   after 2 AM please page floor coverage PA If 7AM-7PM, please contact the day team taking care of the patient  [email or web address]  Password [email or web address]

## 2015-12-12 NOTE — Progress Notes (Signed)
EDCM spoke to patient and her daughter [name] at bedside [phone].  Patient lives alone.  Patient's daughter reports she is in the process of moving in with the patient.  Patient has a walker and a shower bench at home.  Patient's daughter expresses interest in elevated toilet seat for patient's bathroom at home.  Patient's daughter reports if patient is able to be discharged home, they would [facility].  Patient confirms her pcp is Dr. Cyndia BentBadger.  Patient's cardiologist is Dr. [place] and her Urologist is Dr. Lennox LaityEstridge.  Patient to be admitted for right femur fracture post fall.  EDCM discussed possibility of reab at SNF if needed.  Patient and her daughter thankful for services.  No further EDCM needs at this time.

## 2015-12-12 NOTE — ED Provider Notes (Signed)
CSN: [id number]     Arrival date & time 12/12/15  2016 History   First MD Initiated Contact with Patient 12/12/15 2027     Chief Complaint  Patient presents with  . Hip Pain  . Fall  PT PRESENTS VIA EMS.  SHE FELL WHILE BENDING OVER FOR A NEWSPAPER.  SHE HURTS IN HER RIGHT HIP.  PT WAS UNABLE TO GET UP, BUT SHE HAD A LIFE ALERT BUTTON TO PRESS AND CALL EMS.  PT DOES NOT WANT ANYTHING FOR PAIN AT THIS TIME.   (Consider location/radiation/quality/duration/timing/severity/associated sxs/prior Treatment) Patient is a 82 y.o. female presenting with hip pain and fall. The history is provided by the patient.  Hip Pain This is a new problem. The current episode started less than 1 hour ago. The problem occurs constantly.  Fall    Past Medical History  Diagnosis Date  . Iron deficiency anemia   . High cholesterol   . Osteoarthritis   . Female bladder prolapse   . Osteoporosis   . Cataracts, bilateral   . Gallstones   . Hemorrhoids   . Diverticulosis   . Hiatal hernia   . Atrial fibrillation (HCC)   . Cystocele   . Rectocele   . Scoliosis   . Lordosis   . Hyperlipidemia   . Osteoarthritis   . Dysrhythmia     A- fib  . Pneumonia     hx aspiration pneumonia  . Hypothyroidism   . Chronic kidney disease     chronic kidney disease  . Encounter for fitting and adjustment of pessary 04/23/2002  . Renal insufficiency    Past Surgical History  Procedure Laterality Date  . Cholecystectomy    . Cataract extraction    . Esophagogastroduodenoscopy    . Colonoscopy    . Endometrial biopsy  09/2009  . Esophagogastroduodenoscopy N/A 01/09/2013    Procedure: ESOPHAGOGASTRODUODENOSCOPY (EGD);  Surgeon: Vertell Novak., MD;  Location: [facility];  Service: Endoscopy;  Laterality: N/A;  . Rectal ultrasound N/A 04/10/2014    Procedure: RECTAL ULTRASOUND;  Surgeon: Romie Levee, MD;  Location: [facility];  Service: Endoscopy;  Laterality: N/A;   Family History  Problem Relation Age of  Onset  . Breast cancer Paternal Aunt   . Breast cancer Paternal Aunt   . Cancer      several family members on dad's side of family  . Diabetes Father   . Leukemia Father   . Heart failure Brother     Pt cannot confirm this history   Social History  Substance Use Topics  . Smoking status: Never Smoker   . Smokeless tobacco: Never Used  . Alcohol Use: No   OB History    Gravida Para Term Preterm AB TAB SAB Ectopic Multiple Living   4 4        4      Review of Systems  Musculoskeletal:       RIGHT HIP PAIN  All other systems reviewed and are negative.     Allergies  Bactrim; Ivp dye; and Nsaids  Home Medications   Prior to Admission medications   Medication Sig Start Date End Date Taking? Authorizing Provider  acetaminophen (TYLENOL) 650 MG CR tablet Take 650 mg by mouth daily as needed for pain.    Yes Historical Provider, MD  amiodarone (PACERONE) 200 MG tablet TAKE 1/2 TABLET BY MOUTH DAILY Patient taking differently: Take 100 mg by mouth daily. 08/13/15  Yes Peter M [place], MD  Calcium Carb-Cholecalciferol (CALCIUM-VITAMIN  D) 600-400 MG-UNIT TABS Take 1 tablet by mouth 2 (two) times daily.    Yes Historical Provider, MD  docusate sodium (COLACE) 100 MG capsule Take 100 mg by mouth daily as needed (constipation).    Yes Historical Provider, MD  levothyroxine (SYNTHROID, LEVOTHROID) 75 MCG tablet Take 75 mcg by mouth daily before breakfast.   Yes Historical Provider, MD  nitrofurantoin (MACRODANTIN) 50 MG capsule Take 50 mg by mouth daily.  12/27/14  Yes Historical Provider, MD  pantoprazole (PROTONIX) 40 MG tablet Take 40 mg by mouth daily. 01/10/13  Yes Dorothea OgleIskra M Myers, MD  warfarin (COUMADIN) 1 MG tablet Take 2 tablets (2 mg total) by mouth one time only at 6 PM. Patient not taking: Reported on 12/09/2015 08/22/15   Marinda ElkAbraham Feliz Ortiz, MD   BP 166/74 mmHg  Pulse 68  Temp(Src) 97.6 F (36.4 C) (Oral)  Resp 19  SpO2 85% Physical Exam  Constitutional: She is oriented to  person, place, and time. She appears well-developed and well-nourished.  HENT:  Head: Normocephalic and atraumatic.  Right Ear: External ear normal.  Left Ear: External ear normal.  Nose: Nose normal.  Mouth/Throat: Oropharynx is clear and moist.  Eyes: Conjunctivae and EOM are normal. Pupils are equal, round, and reactive to light.  Neck: Normal range of motion. Neck supple.  Cardiovascular: Normal rate, regular rhythm, normal heart sounds and intact distal pulses.   Pulmonary/Chest: Effort normal and breath sounds normal.  Abdominal: Soft. Bowel sounds are normal.  Musculoskeletal:       Right hip: She exhibits bony tenderness and deformity.  Neurological: She is alert and oriented to person, place, and time.  Skin: Skin is warm and dry.  Psychiatric: She has a normal mood and affect. Her behavior is normal. Judgment and thought content normal.  Nursing note and vitals reviewed.   ED Course  Procedures (including critical care time) Labs Review Labs Reviewed  CBC WITH DIFFERENTIAL/PLATELET - Abnormal; Notable for the following:    WBC 14.3 (*)    Hemoglobin 11.1 (*)    HCT 35.5 (*)    MCH 24.9 (*)    RDW 16.4 (*)    Neutro Abs 10.7 (*)    Monocytes Absolute 1.6 (*)    All other components within normal limits  PROTIME-INR  URINALYSIS, ROUTINE W REFLEX MICROSCOPIC (NOT AT [facility])  BASIC METABOLIC PANEL  TYPE AND SCREEN  ABO/RH    Imaging Review Dg Chest 1 View  12/12/2015  CLINICAL DATA:  Larey SeatFell, hip pain EXAM: CHEST 1 VIEW COMPARISON:  08/15/2015 FINDINGS: Large hiatal hernia. Heart size normal. Elevation of the right diaphragm. Right greater than left shoulder arthropathy. Lungs clear. No effusion. IMPRESSION: Numerous chronic findings with no acute abnormality. Electronically Signed   By: Esperanza Heiraymond  Rubner M.D.   On: 12/12/2015 21:57   Dg Pelvis 1-2 Views  12/12/2015  CLINICAL DATA:  Fall, right hip pain EXAM: PELVIS - 1-2 VIEW COMPARISON:  None. FINDINGS: Intertrochanteric  right femur fracture as described in report of right femur. Pelvic bones intact. IMPRESSION: Right femur fracture Electronically Signed   By: Esperanza Heiraymond  Rubner M.D.   On: 12/12/2015 21:58   Dg Femur, Min 2 Views Right  12/12/2015  CLINICAL DATA:  Fall, femur pain EXAM: RIGHT FEMUR 2 VIEWS COMPARISON:  None. FINDINGS: Mildly impacted intertrochanteric right femur fracture with mild varus angulation. Extensive femoral artery calcification. IMPRESSION: Acute femur fracture Electronically Signed   By: Esperanza Heiraymond  Rubner M.D.   On: 12/12/2015 21:58  I have personally reviewed and evaluated these images and lab results as part of my medical decision-making.   EKG Interpretation None      MDM  PT D/W DR. Linna Caprice WHO SAW PT IN THE ED.  PT D/W DR. Adela Glimpse ([facility]) WHO WILL ADMIT PT TO THE HOSPITAL. Final diagnoses:  Intertrochanteric fracture of left hip, closed, initial encounter (HCC)        Jacalyn Lefevre, MD 12/13/15 [phone]

## 2015-12-12 NOTE — ED Notes (Signed)
Bed: RESA Expected date:  Expected time:  Means of arrival:  Comments: EMS 82yo F Fall, Rt hip shortening and rotation

## 2015-12-13 ENCOUNTER — Inpatient Hospital Stay (HOSPITAL_COMMUNITY): Payer: Medicare Other

## 2015-12-13 ENCOUNTER — Encounter (HOSPITAL_COMMUNITY): Payer: Self-pay | Admitting: Oncology

## 2015-12-13 ENCOUNTER — Inpatient Hospital Stay (HOSPITAL_COMMUNITY): Payer: Medicare Other | Admitting: Anesthesiology

## 2015-12-13 ENCOUNTER — Encounter (HOSPITAL_COMMUNITY)
Admission: EM | Disposition: A | Discharge status: Skilled Nursing Facility | Payer: Self-pay | Source: Home / Self Care | Attending: Internal Medicine

## 2015-12-13 DIAGNOSIS — I4891 Unspecified atrial fibrillation: Secondary | ICD-10-CM

## 2015-12-13 DIAGNOSIS — D72829 Elevated white blood cell count, unspecified: Secondary | ICD-10-CM

## 2015-12-13 DIAGNOSIS — E038 Other specified hypothyroidism: Secondary | ICD-10-CM

## 2015-12-13 DIAGNOSIS — S72141A Displaced intertrochanteric fracture of right femur, initial encounter for closed fracture: Principal | ICD-10-CM

## 2015-12-13 HISTORY — PX: INTRAMEDULLARY (IM) NAIL INTERTROCHANTERIC: SHX5875

## 2015-12-13 LAB — COMPREHENSIVE METABOLIC PANEL
ALBUMIN: 3.3 g/dL — AB (ref 3.5–5.0)
ALK PHOS: 55 U/L (ref 38–126)
ALT: 11 U/L — AB (ref 14–54)
ANION GAP: 7 (ref 5–15)
AST: 20 U/L (ref 15–41)
BUN: 18 mg/dL (ref 6–20)
CALCIUM: 8.2 mg/dL — AB (ref 8.9–10.3)
CO2: 28 mmol/L (ref 22–32)
Chloride: 105 mmol/L (ref 101–111)
Creatinine, Ser: 1.36 mg/dL — ABNORMAL HIGH (ref 0.44–1.00)
GFR calc Af Amer: 38 mL/min — ABNORMAL LOW (ref >60)
GFR calc non Af Amer: 33 mL/min — ABNORMAL LOW (ref >60)
GLUCOSE: 136 mg/dL — AB (ref 65–99)
Potassium: 4.7 mmol/L (ref 3.5–5.1)
SODIUM: 140 mmol/L (ref 135–145)
Total Bilirubin: 0.7 mg/dL (ref 0.3–1.2)
Total Protein: 6.8 g/dL (ref 6.5–8.1)

## 2015-12-13 LAB — CBC
HCT: 34.7 % — ABNORMAL LOW (ref 36.0–46.0)
HEMOGLOBIN: 10.5 g/dL — AB (ref 12.0–15.0)
MCH: 24.4 pg — AB (ref 26.0–34.0)
MCHC: 30.3 g/dL (ref 30.0–36.0)
MCV: 80.7 fL (ref 78.0–100.0)
Platelets: 292 10*3/uL (ref 150–400)
RBC: 4.3 MIL/uL (ref 3.87–5.11)
RDW: 16.3 % — ABNORMAL HIGH (ref 11.5–15.5)
WBC: 17.5 10*3/uL — ABNORMAL HIGH (ref 4.0–10.5)

## 2015-12-13 LAB — PHOSPHORUS: Phosphorus: 3.8 mg/dL (ref 2.5–4.6)

## 2015-12-13 LAB — MAGNESIUM: MAGNESIUM: 1.8 mg/dL (ref 1.7–2.4)

## 2015-12-13 LAB — SURGICAL PCR SCREEN
MRSA, PCR: NEGATIVE
Staphylococcus aureus: NEGATIVE

## 2015-12-13 LAB — TSH: TSH: 0.887 u[IU]/mL (ref 0.350–4.500)

## 2015-12-13 LAB — ABO/RH: ABO/RH(D): O NEG

## 2015-12-13 SURGERY — FIXATION, FRACTURE, INTERTROCHANTERIC, WITH INTRAMEDULLARY ROD
Anesthesia: Spinal | Site: Hip | Laterality: Right

## 2015-12-13 MED ORDER — PHENYLEPHRINE HCL 10 MG/ML IJ SOLN
INTRAMUSCULAR | Status: AC
  Filled 2015-12-13: qty 2

## 2015-12-13 MED ORDER — ONDANSETRON HCL 4 MG/2ML IJ SOLN
INTRAMUSCULAR | Status: AC
  Filled 2015-12-13: qty 2

## 2015-12-13 MED ORDER — ACETAMINOPHEN 650 MG RE SUPP: 650.0000 mg | Freq: Four times a day (QID) | RECTAL | Status: DC | PRN

## 2015-12-13 MED ORDER — PHENYLEPHRINE HCL 10 MG/ML IJ SOLN
INTRAMUSCULAR | Status: DC | PRN
  Administered 2015-12-13 (×2): 80 ug via INTRAVENOUS

## 2015-12-13 MED ORDER — MAGNESIUM CITRATE PO SOLN
1.0000 | Freq: Once | ORAL | Status: DC | PRN
Start: 2015-12-13 — End: 2015-12-16

## 2015-12-13 MED ORDER — APIXABAN 2.5 MG PO TABS
2.5000 mg | ORAL_TABLET | Freq: Two times a day (BID) | ORAL | Status: DC
  Administered 2015-12-13 – 2015-12-16 (×6): 2.5 mg via ORAL
  Filled 2015-12-13 (×6): qty 1

## 2015-12-13 MED ORDER — SODIUM CHLORIDE 0.9 % IJ SOLN
INTRAMUSCULAR | Status: AC
  Filled 2015-12-13: qty 50

## 2015-12-13 MED ORDER — SODIUM CHLORIDE 0.9% FLUSH: 3.0000 mL | Freq: Two times a day (BID) | INTRAVENOUS | Status: DC

## 2015-12-13 MED ORDER — PHENYLEPHRINE HCL 10 MG/ML IJ SOLN
20.0000 mg | INTRAVENOUS | Status: DC | PRN
  Administered 2015-12-13: 50 ug/min via INTRAVENOUS

## 2015-12-13 MED ORDER — ONDANSETRON HCL 4 MG PO TABS: 4.0000 mg | ORAL_TABLET | Freq: Four times a day (QID) | ORAL | Status: DC | PRN

## 2015-12-13 MED ORDER — CEFAZOLIN SODIUM-DEXTROSE 2-4 GM/100ML-% IV SOLN
2.0000 g | INTRAVENOUS | Status: AC
  Administered 2015-12-13: 2 g via INTRAVENOUS

## 2015-12-13 MED ORDER — CHLORHEXIDINE GLUCONATE 4 % EX LIQD
60.0000 mL | Freq: Once | CUTANEOUS | Status: AC
  Administered 2015-12-13: 4 via TOPICAL
  Filled 2015-12-13: qty 60

## 2015-12-13 MED ORDER — LACTATED RINGERS IV SOLN
INTRAVENOUS | Status: DC | PRN
  Administered 2015-12-13 (×2): via INTRAVENOUS

## 2015-12-13 MED ORDER — CEFAZOLIN SODIUM-DEXTROSE 2-4 GM/100ML-% IV SOLN
INTRAVENOUS | Status: AC
  Filled 2015-12-13: qty 100

## 2015-12-13 MED ORDER — EPHEDRINE SULFATE 50 MG/ML IJ SOLN
INTRAMUSCULAR | Status: AC
  Filled 2015-12-13: qty 1

## 2015-12-13 MED ORDER — PHENOL 1.4 % MT LIQD: 1.0000 | OROMUCOSAL | Status: DC | PRN

## 2015-12-13 MED ORDER — METHOCARBAMOL 500 MG PO TABS: 500.0000 mg | ORAL_TABLET | Freq: Four times a day (QID) | ORAL | Status: DC | PRN

## 2015-12-13 MED ORDER — PROPOFOL 10 MG/ML IV BOLUS
INTRAVENOUS | Status: AC
  Filled 2015-12-13: qty 20

## 2015-12-13 MED ORDER — MORPHINE SULFATE (PF) 2 MG/ML IV SOLN: 0.5000 mg | INTRAVENOUS | Status: DC | PRN

## 2015-12-13 MED ORDER — CEFAZOLIN SODIUM-DEXTROSE 2-4 GM/100ML-% IV SOLN
2.0000 g | Freq: Four times a day (QID) | INTRAVENOUS | Status: AC
  Administered 2015-12-13 (×2): 2 g via INTRAVENOUS
  Filled 2015-12-13 (×2): qty 100

## 2015-12-13 MED ORDER — LEVOTHYROXINE SODIUM 75 MCG PO TABS
75.0000 ug | ORAL_TABLET | Freq: Every day | ORAL | Status: DC
  Administered 2015-12-13 – 2015-12-16 (×4): 75 ug via ORAL
  Filled 2015-12-13 (×4): qty 1

## 2015-12-13 MED ORDER — ROCURONIUM BROMIDE 100 MG/10ML IV SOLN
INTRAVENOUS | Status: AC
  Filled 2015-12-13: qty 1

## 2015-12-13 MED ORDER — ONDANSETRON HCL 4 MG/2ML IJ SOLN
4.0000 mg | Freq: Once | INTRAMUSCULAR | Status: AC | PRN
  Administered 2015-12-13 (×2): 4 mg via INTRAVENOUS

## 2015-12-13 MED ORDER — SENNA 8.6 MG PO TABS
1.0000 | ORAL_TABLET | Freq: Two times a day (BID) | ORAL | Status: DC
  Administered 2015-12-13 – 2015-12-16 (×6): 8.6 mg via ORAL
  Filled 2015-12-13 (×6): qty 1

## 2015-12-13 MED ORDER — ISOPROPYL ALCOHOL 70 % SOLN
Status: DC | PRN
  Administered 2015-12-13: 1 via TOPICAL

## 2015-12-13 MED ORDER — POLYETHYLENE GLYCOL 3350 17 G PO PACK: 17.0000 g | PACK | Freq: Every day | ORAL | Status: DC | PRN

## 2015-12-13 MED ORDER — METOCLOPRAMIDE HCL 5 MG/ML IJ SOLN: 5.0000 mg | Freq: Three times a day (TID) | INTRAMUSCULAR | Status: DC | PRN

## 2015-12-13 MED ORDER — ONDANSETRON HCL 4 MG/2ML IJ SOLN: 4.0000 mg | Freq: Four times a day (QID) | INTRAMUSCULAR | Status: DC | PRN

## 2015-12-13 MED ORDER — METHOCARBAMOL 1000 MG/10ML IJ SOLN
500.0000 mg | Freq: Four times a day (QID) | INTRAMUSCULAR | Status: DC | PRN
  Filled 2015-12-13: qty 5

## 2015-12-13 MED ORDER — PANTOPRAZOLE SODIUM 40 MG PO TBEC
40.0000 mg | DELAYED_RELEASE_TABLET | Freq: Every day | ORAL | Status: DC
  Administered 2015-12-13 – 2015-12-16 (×4): 40 mg via ORAL
  Filled 2015-12-13 (×4): qty 1

## 2015-12-13 MED ORDER — ACETAMINOPHEN 325 MG PO TABS: 650.0000 mg | ORAL_TABLET | Freq: Four times a day (QID) | ORAL | Status: DC | PRN

## 2015-12-13 MED ORDER — METHOCARBAMOL 500 MG PO TABS
500.0000 mg | ORAL_TABLET | Freq: Four times a day (QID) | ORAL | Status: DC | PRN
  Administered 2015-12-13 – 2015-12-16 (×7): 500 mg via ORAL
  Filled 2015-12-13 (×9): qty 1

## 2015-12-13 MED ORDER — FENTANYL CITRATE (PF) 100 MCG/2ML IJ SOLN
INTRAMUSCULAR | Status: DC | PRN
  Administered 2015-12-13: 50 ug via INTRAVENOUS

## 2015-12-13 MED ORDER — PHENYLEPHRINE 40 MCG/ML (10ML) SYRINGE FOR IV PUSH (FOR BLOOD PRESSURE SUPPORT)
PREFILLED_SYRINGE | INTRAVENOUS | Status: AC
  Filled 2015-12-13: qty 10

## 2015-12-13 MED ORDER — PROMETHAZINE HCL 25 MG/ML IJ SOLN
INTRAMUSCULAR | Status: AC
  Administered 2015-12-13 (×2): 12.5 mg via INTRAVENOUS
  Filled 2015-12-13: qty 1

## 2015-12-13 MED ORDER — KETAMINE HCL 10 MG/ML IJ SOLN
INTRAMUSCULAR | Status: AC
  Filled 2015-12-13: qty 1

## 2015-12-13 MED ORDER — HYDROCODONE-ACETAMINOPHEN 5-325 MG PO TABS
1.0000 | ORAL_TABLET | ORAL | Status: DC | PRN
  Administered 2015-12-13: 2 via ORAL
  Filled 2015-12-13: qty 2

## 2015-12-13 MED ORDER — SORBITOL 70 % SOLN
30.0000 mL | Freq: Every day | Status: DC | PRN
  Filled 2015-12-13: qty 30

## 2015-12-13 MED ORDER — HYDROCODONE-ACETAMINOPHEN 5-325 MG PO TABS
1.0000 | ORAL_TABLET | Freq: Four times a day (QID) | ORAL | Status: DC | PRN
  Administered 2015-12-13 – 2015-12-14 (×3): 2 via ORAL
  Filled 2015-12-13 (×3): qty 2

## 2015-12-13 MED ORDER — DOCUSATE SODIUM 100 MG PO CAPS
100.0000 mg | ORAL_CAPSULE | Freq: Two times a day (BID) | ORAL | Status: DC
  Administered 2015-12-13 – 2015-12-16 (×6): 100 mg via ORAL
  Filled 2015-12-13 (×6): qty 1

## 2015-12-13 MED ORDER — AMIODARONE HCL 100 MG PO TABS
100.0000 mg | ORAL_TABLET | Freq: Every day | ORAL | Status: DC
  Administered 2015-12-13 – 2015-12-16 (×4): 100 mg via ORAL
  Filled 2015-12-13 (×4): qty 1

## 2015-12-13 MED ORDER — PROPOFOL 10 MG/ML IV BOLUS
INTRAVENOUS | Status: DC | PRN
  Administered 2015-12-13: 25 mg via INTRAVENOUS

## 2015-12-13 MED ORDER — METOCLOPRAMIDE HCL 10 MG PO TABS: 5.0000 mg | ORAL_TABLET | Freq: Three times a day (TID) | ORAL | Status: DC | PRN

## 2015-12-13 MED ORDER — POVIDONE-IODINE 10 % EX SWAB
2.0000 | Freq: Once | CUTANEOUS | Status: AC
Start: 2015-12-13 — End: 2015-12-13
  Administered 2015-12-13: 2 via TOPICAL

## 2015-12-13 MED ORDER — SODIUM CHLORIDE 0.9 % IJ SOLN
INTRAMUSCULAR | Status: AC
  Filled 2015-12-13: qty 10

## 2015-12-13 MED ORDER — LIDOCAINE HCL (CARDIAC) 20 MG/ML IV SOLN
INTRAVENOUS | Status: AC
  Filled 2015-12-13: qty 5

## 2015-12-13 MED ORDER — LEVOTHYROXINE SODIUM 100 MCG IV SOLR
25.0000 ug | Freq: Once | INTRAVENOUS | Status: DC
  Filled 2015-12-13: qty 5

## 2015-12-13 MED ORDER — SODIUM CHLORIDE 0.9 % IR SOLN
Status: DC | PRN
  Administered 2015-12-13: 1000 mL

## 2015-12-13 MED ORDER — KETAMINE HCL 10 MG/ML IJ SOLN
INTRAMUSCULAR | Status: DC | PRN
  Administered 2015-12-13: 30 mg via INTRAVENOUS

## 2015-12-13 MED ORDER — FENTANYL CITRATE (PF) 100 MCG/2ML IJ SOLN
INTRAMUSCULAR | Status: AC
  Filled 2015-12-13: qty 2

## 2015-12-13 MED ORDER — SODIUM CHLORIDE 0.9 % IV SOLN
INTRAVENOUS | Status: AC
  Administered 2015-12-13: 01:00:00 via INTRAVENOUS

## 2015-12-13 MED ORDER — METHOCARBAMOL 1000 MG/10ML IJ SOLN
500.0000 mg | Freq: Four times a day (QID) | INTRAVENOUS | Status: DC | PRN
  Filled 2015-12-13: qty 5

## 2015-12-13 MED ORDER — ISOPROPYL ALCOHOL 70 % SOLN
Status: AC
Start: 2015-12-13 — End: 2015-12-13
  Filled 2015-12-13: qty 480

## 2015-12-13 MED ORDER — FENTANYL CITRATE (PF) 100 MCG/2ML IJ SOLN: 25.0000 ug | INTRAMUSCULAR | Status: DC | PRN

## 2015-12-13 MED ORDER — MENTHOL 3 MG MT LOZG: 1.0000 | LOZENGE | OROMUCOSAL | Status: DC | PRN

## 2015-12-13 MED ORDER — MORPHINE SULFATE (PF) 2 MG/ML IV SOLN
0.5000 mg | INTRAVENOUS | Status: DC | PRN
  Administered 2015-12-14 – 2015-12-15 (×2): 0.5 mg via INTRAVENOUS
  Filled 2015-12-13 (×2): qty 1

## 2015-12-13 MED ORDER — BUPIVACAINE HCL (PF) 0.25 % IJ SOLN
INTRAMUSCULAR | Status: AC
  Filled 2015-12-13: qty 30

## 2015-12-13 SURGICAL SUPPLY — 38 items
BAG SPEC THK2 15X12 ZIP CLS (MISCELLANEOUS) ×1
BAG ZIPLOCK 12X15 (MISCELLANEOUS) ×2 IMPLANT
BIT DRILL 4.2 (DRILL) IMPLANT
BLADE HELICAL TFNA 95 STRL (Anchor) ×1 IMPLANT
BLADE HELICAL TFNA 95MM STRL (Anchor) ×1 IMPLANT
CHLORAPREP W/TINT 26ML (MISCELLANEOUS) ×3 IMPLANT
COVER PERINEAL POST (MISCELLANEOUS) ×3 IMPLANT
DRAPE C-ARM 42X120 X-RAY (DRAPES) ×3 IMPLANT
DRAPE C-ARMOR (DRAPES) ×3 IMPLANT
DRAPE SHEET LG 3/4 BI-LAMINATE (DRAPES) ×3 IMPLANT
DRAPE STERI IOBAN 125X83 (DRAPES) ×3 IMPLANT
DRAPE U-SHAPE 47X51 STRL (DRAPES) ×6 IMPLANT
DRILL 4.2 (DRILL) ×3
DRSG AQUACEL AG ADV 3.5X 4 (GAUZE/BANDAGES/DRESSINGS) ×4 IMPLANT
DRSG AQUACEL AG ADV 3.5X 6 (GAUZE/BANDAGES/DRESSINGS) ×2 IMPLANT
DRSG MEPILEX BORDER 4X4 (GAUZE/BANDAGES/DRESSINGS) ×6 IMPLANT
DRSG MEPILEX BORDER 4X8 (GAUZE/BANDAGES/DRESSINGS) IMPLANT
ELECT BLADE TIP CTD 4 INCH (ELECTRODE) ×2 IMPLANT
FACESHIELD WRAPAROUND (MASK) ×3 IMPLANT
FACESHIELD WRAPAROUND OR TEAM (MASK) ×1 IMPLANT
GAUZE SPONGE 4X4 12PLY STRL (GAUZE/BANDAGES/DRESSINGS) ×3 IMPLANT
GLOVE BIO SURGEON STRL SZ8.5 (GLOVE) ×6 IMPLANT
GLOVE BIOGEL PI IND STRL 8.5 (GLOVE) ×1 IMPLANT
GLOVE BIOGEL PI INDICATOR 8.5 (GLOVE) ×2
GOWN SPEC L3 XXLG W/TWL (GOWN DISPOSABLE) ×3 IMPLANT
GUIDEWIRE 3.2X400 (WIRE) ×4 IMPLANT
KIT BASIN OR (CUSTOM PROCEDURE TRAY) ×3 IMPLANT
LIQUID BAND (GAUZE/BANDAGES/DRESSINGS) ×6 IMPLANT
MANIFOLD NEPTUNE II (INSTRUMENTS) ×3 IMPLANT
MARKER SKIN DUAL TIP RULER LAB (MISCELLANEOUS) ×3 IMPLANT
NAIL CANN TFNA 10MM/130DEG (Nail) ×2 IMPLANT
PACK TOTAL JOINT WL LF (CUSTOM PROCEDURE TRAY) ×3 IMPLANT
REAMER ROD DEEP FLUTE 2.5X950 (INSTRUMENTS) ×2 IMPLANT
SCREW LOCKING 5.0X38MM (Screw) ×2 IMPLANT
SUT MNCRL AB 3-0 PS2 18 (SUTURE) ×3 IMPLANT
SUT MONO 2 0 UR5 27IN (SUTURE) ×2 IMPLANT
SUT VIC AB 1 CT1 36 (SUTURE) ×3 IMPLANT
YANKAUER SUCT BULB TIP NO VENT (SUCTIONS) IMPLANT

## 2015-12-13 NOTE — Anesthesia Preprocedure Evaluation (Signed)
Anesthesia Evaluation  Patient identified by MRN, date of birth, ID band Patient awake    Reviewed: Allergy & Precautions, NPO status , Patient's Chart, lab work & pertinent test results  Airway Mallampati: II  TM Distance: >3 FB Neck ROM: Full    Dental  (+) Teeth Intact, Dental Advisory Given   Pulmonary    breath sounds clear to auscultation       Cardiovascular  Rhythm:Regular Rate:Normal     Neuro/Psych    GI/Hepatic   Endo/Other    Renal/GU      Musculoskeletal   Abdominal   Peds  Hematology   Anesthesia Other Findings   Reproductive/Obstetrics                             Anesthesia Physical Anesthesia Plan  ASA: III  Anesthesia Plan: MAC and Spinal   Post-op Pain Management:    Induction:   Airway Management Planned: Simple Face Mask and Natural Airway  Additional Equipment:   Intra-op Plan:   Post-operative Plan:   Informed Consent: I have reviewed the patients History and Physical, chart, labs and discussed the procedure including the risks, benefits and alternatives for the proposed anesthesia with the patient or authorized representative who has indicated his/her understanding and acceptance.     Plan Discussed with: CRNA and Anesthesiologist  Anesthesia Plan Comments:         Anesthesia Quick Evaluation

## 2015-12-13 NOTE — Anesthesia Procedure Notes (Signed)
Spinal  Start time: 12/13/2015 8:00 AM End time: 12/13/2015 8:05 AM Staffing Performed by: anesthesiologist  Preanesthetic Checklist Completed: patient identified, site marked, surgical consent, pre-op evaluation, timeout performed, IV checked, risks and benefits discussed and monitors and equipment checked Spinal Block Patient position: right lateral decubitus Prep: ChloraPrep Patient monitoring: heart rate, cardiac monitor, continuous pulse ox and blood pressure Location: L4-5 Injection technique: single-shot Needle Needle type: Tuohy  Needle gauge: 22 G Needle length: 9 cm Additional Notes 10 mg 0.75% bupivacaine injected easily

## 2015-12-13 NOTE — Interval H&P Note (Signed)
History and Physical Interval Note:  12/13/2015 7:05 AM  Carla [name]  has presented today for surgery, with the diagnosis of Right intertrochantric femur fractue  The various methods of treatment have been discussed with the patient and family. After consideration of risks, benefits and other options for treatment, the patient has consented to  Procedure(s): INTRAMEDULLARY (IM) NAIL INTERTROCHANTRIC (Right) as a surgical intervention .  The patient's history has been reviewed, patient examined, no change in status, stable for surgery.  I have reviewed the patient's chart and labs.  Questions were answered to the patient's satisfaction.    The risks, benefits, and alternatives were discussed with the patient. There are risks associated with the surgery including, but not limited to, problems with anesthesia (death), infection, differences in leg length/angulation/rotation, fracture of bones, loosening or failure of implants, malunion, nonunion, hematoma (blood accumulation) which may require surgical drainage, blood clots, pulmonary embolism, nerve injury (foot drop), and blood vessel injury. The patient understands these risks and elects to proceed.    Madissen Wyse, Cloyde ReamsBrian James

## 2015-12-13 NOTE — Discharge Instructions (Signed)
°Dr. Brian Swinteck °Adult Hip & Knee Specialist °[facility] °[address] °[place], [place] [address] °[phone] ° ° °POSTOPERATIVE DIRECTIONS ° ° ° °Hip Rehabilitation, Guidelines Following Surgery  ° °WEIGHT BEARING °Partial weight bearing with assist device as directed.  50% with walker ° ° °HOME CARE INSTRUCTIONS  °Remove items at home which could result in a fall. This includes throw rugs or furniture in walking pathways.  °Continue medications as instructed at time of discharge. °· You may have some home medications which will be placed on hold until you complete the course of blood thinner medication. °· 4 days after discharge, you may start showering. No tub baths or soaking your incisions. °Do not put on socks or shoes without following the instructions of your caregivers.   °Sit on chairs with arms. Use the chair arms to help push yourself up when arising.  °Arrange for the use of a toilet seat elevator so you are not sitting low.  °· Walk with walker as instructed.  °You may resume a sexual relationship in one month or when given the OK by your caregiver.  °Use walker as long as suggested by your caregivers.  °Avoid periods of inactivity such as sitting longer than an hour when not asleep. This helps prevent blood clots.  °You may return to work once you are cleared by your surgeon.  °Do not drive a car for 6 weeks or until released by your surgeon.  °Do not drive while taking narcotics.  °Wear elastic stockings for two weeks following surgery during the day but you may remove then at night.  °Make sure you keep all of your appointments after your operation with all of your doctors and caregivers. You should call the office at the above phone number and make an appointment for approximately two weeks after the date of your surgery. °Please pick up a stool softener and laxative for home use as long as you are requiring pain medications. °· ICE to the affected hip every three  hours for 30 minutes at a time and then as needed for pain and swelling. Continue to use ice on the hip for pain and swelling from surgery. You may notice swelling that will progress down to the foot and ankle.  This is normal after surgery.  Elevate the leg when you are not up walking on it.   °It is important for you to complete the blood thinner medication as prescribed by your doctor. °· Continue to use the breathing machine which will help keep your temperature down.  It is common for your temperature to cycle up and down following surgery, especially at night when you are not up moving around and exerting yourself.  The breathing machine keeps your lungs expanded and your temperature down. ° °RANGE OF MOTION AND STRENGTHENING EXERCISES  °These exercises are designed to help you keep full movement of your hip joint. Follow your caregiver's or physical therapist's instructions. Perform all exercises about fifteen times, three times per day or as directed. Exercise both hips, even if you have had only one joint replacement. These exercises can be done on a training (exercise) mat, on the floor, on a table or on a bed. Use whatever works the best and is most comfortable for you. Use music or television while you are exercising so that the exercises are a pleasant break in your day. This will make your life better with the exercises acting as a break in routine you can look   forward to.  Lying on your back, slowly slide your foot toward your buttocks, raising your knee up off the floor. Then slowly slide your foot back down until your leg is straight again.  Lying on your back spread your legs as far apart as you can without causing discomfort.  Lying on your side, raise your upper leg and foot straight up from the floor as far as is comfortable. Slowly lower the leg and repeat.  Lying on your back, tighten up the muscle in the front of your thigh (quadriceps muscles). You can do this by keeping your leg  straight and trying to raise your heel off the floor. This helps strengthen the largest muscle supporting your knee.  Lying on your back, tighten up the muscles of your buttocks both with the legs straight and with the knee bent at a comfortable angle while keeping your heel on the floor.   SKILLED REHAB INSTRUCTIONS: If the patient is transferred to a skilled rehab facility following release from the hospital, a list of the current medications will be sent to the facility for the patient to continue.  When discharged from the skilled rehab facility, please have the facility set up the patient's Home Health Physical Therapy prior to being released. Also, the skilled facility will be responsible for providing the patient with their medications at time of release from the facility to include their pain medication and their blood thinner medication. If the patient is still at the rehab facility at time of the two week follow up appointment, the skilled rehab facility will also need to assist the patient in arranging follow up appointment in our office and any transportation needs.  MAKE SURE YOU:  Understand these instructions.  Will watch your condition.  Will get help right away if you are not doing well or get worse.  Pick up stool softner and laxative for home use following surgery while on pain medications. Daily dry dressing changes as needed. In 4 days, you may remove your dressings and begin taking showers - no tub baths or soaking the incisions. Continue to use ice for pain and swelling after surgery. Do not use any lotions or creams on the incision until instructed by your surgeon.    Information on my medicine - ELIQUIS (apixaban) This medication education was reviewed with me or my healthcare representative as part of my discharge preparation.  The pharmacist that spoke with me during my hospital stay was:  Wynona Canes  Why was Eliquis prescribed for you? Eliquis was prescribed for  you to reduce the risk of blood clots forming after orthopedic surgery.    What do You need to know about Eliquis? Take your Eliquis TWICE DAILY - one tablet in the morning and one tablet in the evening with or without food.  It would be best to take the dose about the same time each day.  If you have difficulty swallowing the tablet whole please discuss with your pharmacist how to take the medication safely.  Take Eliquis exactly as prescribed by your doctor and DO NOT stop taking Eliquis without talking to the doctor who prescribed the medication.  Stopping without other medication to take the place of Eliquis may increase your risk of developing a clot.  After discharge, you should have regular check-up appointments with your healthcare provider that is prescribing your Eliquis.  What do you do if you miss a dose? If a dose of ELIQUIS is not taken at the scheduled time, take  it as soon as possible on the same day and twice-daily administration should be resumed.  The dose should not be doubled to make up for a missed dose.  Do not take more than one tablet of ELIQUIS at the same time.  Important Safety Information A possible side effect of Eliquis is bleeding. You should call your healthcare provider right away if you experience any of the following: ? Bleeding from an injury or your nose that does not stop. ? Unusual colored urine (red or dark brown) or unusual colored stools (red or black). ? Unusual bruising for unknown reasons. ? A serious fall or if you hit your head (even if there is no bleeding).  Some medicines may interact with Eliquis and might increase your risk of bleeding or clotting while on Eliquis. To help avoid this, consult your healthcare provider or pharmacist prior to using any new prescription or non-prescription medications, including herbals, vitamins, non-steroidal anti-inflammatory drugs (NSAIDs) and supplements.  This website has more information on  Eliquis (apixaban): http://www.eliquis.com/eliquis/home

## 2015-12-13 NOTE — H&P (View-Only) (Signed)
 ORTHOPAEDIC CONSULTATION  REQUESTING PHYSICIAN: Julie Haviland, MD  PCP:  BADGER,MICHAEL C, MD  Chief Complaint: Right hip fracture  HPI: Carla [name] is a 82 y.o. female with multiple medical problems including recent treatment for bilateral pulmonary embolism who spent 3 months on Coumadin and has an IVC filter. She was recently taken off of the Coumadin, and she is not on any anticoagulation. She slipped and fell on her but at home earlier today and injured her right hip. She has right hip pain and inability to weight-bear. She was brought to the emergency department at [facility] where x-rays revealed a right intertrochanteric femur fracture. Orthopedic consultation was placed for management of her fracture. She denies other injuries.  Past Medical History  Diagnosis Date  . Iron deficiency anemia   . High cholesterol   . Osteoarthritis   . Female bladder prolapse   . Osteoporosis   . Cataracts, bilateral   . Gallstones   . Hemorrhoids   . Diverticulosis   . Hiatal hernia   . Atrial fibrillation (HCC)   . Cystocele   . Rectocele   . Scoliosis   . Lordosis   . Hyperlipidemia   . Osteoarthritis   . Dysrhythmia     A- fib  . Pneumonia     hx aspiration pneumonia  . Hypothyroidism   . Chronic kidney disease     chronic kidney disease  . Encounter for fitting and adjustment of pessary 04/23/2002  . Renal insufficiency    Past Surgical History  Procedure Laterality Date  . Cholecystectomy    . Cataract extraction    . Esophagogastroduodenoscopy    . Colonoscopy    . Endometrial biopsy  09/2009  . Esophagogastroduodenoscopy N/A 01/09/2013    Procedure: ESOPHAGOGASTRODUODENOSCOPY (EGD);  Surgeon: James L Edwards Jr., MD;  Location: [facility];  Service: Endoscopy;  Laterality: N/A;  . Rectal ultrasound N/A 04/10/2014    Procedure: RECTAL ULTRASOUND;  Surgeon: Alicia Thomas, MD;  Location: [facility];  Service: Endoscopy;  Laterality: N/A;   Social History    Social History  . Marital Status: Widowed    Spouse Name: N/A  . Number of Children: 4  . Years of Education: N/A   Occupational History  . Retired schoolteacher    Social History Main Topics  . Smoking status: Never Smoker   . Smokeless tobacco: Never Used  . Alcohol Use: No  . Drug Use: No  . Sexual Activity: No   Other Topics Concern  . None   Social History Narrative   Widowed.  Lives alone.  Uses a cane/walker to ambulate but independent of ADLs.   Family History  Problem Relation Age of Onset  . Breast cancer Paternal Aunt   . Breast cancer Paternal Aunt   . Cancer      several family members on dad's side of family  . Diabetes Father   . Leukemia Father   . Heart failure Brother     Pt cannot confirm this history   Allergies  Allergen Reactions  . Bactrim [Sulfamethoxazole-Trimethoprim] Other (See Comments)    Has chronic kidney disease.  This is not recommended by nephrologist.    . Ivp Dye [Iodinated Diagnostic Agents]   . Nsaids Other (See Comments)    Has chronic kidney disease.  This is not recommended by nephrologist.   Prior to Admission medications   Medication Sig Start Date End Date Taking? Authorizing Provider  acetaminophen (TYLENOL) 650 MG CR tablet Take 650   mg by mouth 2 (two) times daily.    Historical Provider, MD  amiodarone (PACERONE) 200 MG tablet TAKE 1/2 TABLET BY MOUTH DAILY Patient taking differently: Take 100 mg by mouth daily. 08/13/15   Peter M SwazilandJordan, MD  Calcium Carb-Cholecalciferol (CALCIUM-VITAMIN D) 600-400 MG-UNIT TABS Take 1 tablet by mouth 2 (two) times daily.     Historical Provider, MD  docusate sodium (COLACE) 100 MG capsule Take 100 mg by mouth daily as needed (constipation).     Historical Provider, MD  levothyroxine (SYNTHROID, LEVOTHROID) 75 MCG tablet Take 75 mcg by mouth daily before breakfast.    Historical Provider, MD  nitrofurantoin (MACRODANTIN) 50 MG capsule Take 50 mg by mouth. 12/27/14   Historical Provider,  MD  pantoprazole (PROTONIX) 40 MG tablet Take 40 mg by mouth daily. 01/10/13   Dorothea OgleIskra M Myers, MD  warfarin (COUMADIN) 1 MG tablet Take 2 tablets (2 mg total) by mouth one time only at 6 PM. Patient not taking: Reported on 12/09/2015 08/22/15   Marinda ElkAbraham Feliz Ortiz, MD   No results found.  Positive ROS: All other systems have been reviewed and were otherwise negative with the exception of those mentioned in the HPI and as above.  Physical Exam: General: Alert, no acute distress Cardiovascular: No pedal edema Respiratory: No cyanosis, no use of accessory musculature GI: No organomegaly, abdomen is soft and non-tender Skin: No lesions in the area of chief complaint Neurologic: Sensation intact distally Psychiatric: Patient is competent for consent with normal mood and affect Lymphatic: No axillary or cervical lymphadenopathy  MUSCULOSKELETAL: Right lower extremity is shortened and externally rotated. Her skin is intact. She has pain with attempted logrolling of the hip. She has a 1+ DP pulse. She can wiggle her toes. She reports intact sensation.  Assessment: Right intertrochanteric femur fracture  Plan: I discussed the findings with the patient. She has a displaced right intertrochanteric femur fracture, and I recommended intramedullary fixation. We discussed the risks, benefits, and alternatives. She has an increased risk for perioperative medical complications given her recent pulmonary embolism and multiple medical problems. Luckily she has an IVC filter in place. We will plan for surgery in the morning. Nothing by mouth after midnight.     The risks, benefits, and alternatives were discussed with the patient. There are risks associated with the surgery including, but not limited to, problems with anesthesia (death), infection, differences in leg length/angulation/rotation, fracture of bones, loosening or failure of implants, malunion, nonunion, hematoma (blood accumulation) which may  require surgical drainage, blood clots, pulmonary embolism, nerve injury (foot drop), and blood vessel injury. The patient understands these risks and elects to proceed.     Colsen Modi, Cloyde ReamsBrian James, MD Cell [phone]    12/12/2015 9:37 PM

## 2015-12-13 NOTE — Anesthesia Postprocedure Evaluation (Signed)
Anesthesia Post Note  Patient: Carla FellerEarline S [name]  Procedure(s) Performed: Procedure(s) (LRB): INTRAMEDULLARY (IM) NAIL INTERTROCHANTRIC (Right)  Patient location during evaluation: PACU Anesthesia Type: Spinal and MAC Level of consciousness: awake, awake and alert and oriented Pain management: pain level controlled Vital Signs Assessment: post-procedure vital signs reviewed and stable Respiratory status: spontaneous breathing and nonlabored ventilation Postop Assessment: no headache Anesthetic complications: no    Last Vitals:  Filed Vitals:   12/13/15 1045 12/13/15 1100  BP: 140/96 126/58  Pulse: 75   Temp: 36.3 C 36.8 C  Resp: 16     Last Pain:  Filed Vitals:   12/13/15 1203  PainSc: 0-No pain                 Dayona Shaheen COKER

## 2015-12-13 NOTE — Brief Op Note (Signed)
12/13/2015  9:01 AM  PATIENT:  Carla FellerEarline S [name]  82 y.o. female  PRE-OPERATIVE DIAGNOSIS:  Right intertrochantric femur fracture  POST-OPERATIVE DIAGNOSIS:  Right intertrochantric femur fracture  PROCEDURE:  Procedure(s): INTRAMEDULLARY (IM) NAIL INTERTROCHANTRIC (Right)  SURGEON:  Surgeon(s) and Role:    * Samson FredericBrian Deepak Bless, MD - Primary  PHYSICIAN ASSISTANT: Georgena SpurlingJackie Bissel, PA-C  ASSISTANTS: none   ANESTHESIA:   spinal  EBL:   100 mL.  BLOOD ADMINISTERED:none  DRAINS: none   LOCAL MEDICATIONS USED:  NONE  SPECIMEN:  No Specimen  DISPOSITION OF SPECIMEN:  N/A  COUNTS:  YES  TOURNIQUET:  * No tourniquets in log *  DICTATION: .Other Dictation: Dictation Number [id number]  PLAN OF CARE: Admit to inpatient   PATIENT DISPOSITION:  PACU - hemodynamically stable.   Delay start of Pharmacological VTE agent (>24hrs) due to surgical blood loss or risk of bleeding: no

## 2015-12-13 NOTE — Progress Notes (Signed)
PROGRESS NOTE    Carla [name]  WJX:[id number] DOB: [birth year] DOA: 12/12/2015 PCP: Eartha InchBADGER,MICHAEL C, MD   Brief Narrative:  Carla [name] is a 82 year old female with a complex past medical history including atrial fibrillation, history of urosepsis, history of upper GI bleeding, hypothyroidism, pulmonary embolism status post IVC filter placement, admitted to medicine service on 12/13/2015 which presented after having a fall at home. She reportedly bent over to pick up newspaper lost balance and fell on her right hip. Was unable to bear weight afterwards which she was brought to the emergency room. Imaging studies revealed an acute intertrochanteric right femoral fracture.   Assessment & Plan:   Active Problems:   Atrial fibrillation (HCC)   Leukocytosis   Hypothyroid   Closed right femoral fracture (HCC)   Femur fracture, right (HCC)   Fracture, intertrochanteric, right femur (HCC)   1.  Intertrochanteric right femur fracture -Patient is a pleasant 82 year old with multiple medical problems, having a fall at home landing on her right side and injuring her right hip. -Imaging studies performed in the emergency department revealing an intertrochanteric femoral fracture of right hip. -Orthopedic surgery was consulted she was taken to the operating room on 12/13/2015 undergoing intramedullary nail placement. Procedure performed by Dr. Linna CapriceSwinteck orthopedic surgery. -She is being seen postoperatively, no immediate complications. -Follow-up on CBC. -Physical therapy consultation, will likely require skilled nursing facility placement. Social work consult for disposition.  2.  Atrial fibrillation -CHADSVasc score of 4 -Having history of GI bleed she had not been chronically anticoagulated prior to this hospitalization -Ventricular rates in the 70s to 80s on telemetry. -Continue amiodarone 100 mg by mouth daily  3.  Leukocytosis. -Lab work showing a white count of 17,500, suspect  related to hip fracture. -Otherwise she is afebrile and hemodynamically stable  4. History of bilateral pulmonary emboli -Had been anticoagulated with warfarin which was discontinued after 3 months of [facility] -Now on eliquis for DVT prophylaxis  5.  Hypothyroidism.  -Continue thyroid replacement therapy   DVT prophylaxis: Eliquis Code Status: Full code Family Communication: Spoke with her daughter was present at bedside Disposition Plan: Patient will likely require nursing facility placement  Consultants:   Orthopedic surgery  Procedures:  INTRAMEDULLARY (IM) NAIL INTERTROCHANTRIC (Right)  Subjective: Carla [name] is being seen postoperatively, she is in no acute distress.   Objective: Filed Vitals:   12/13/15 1030 12/13/15 1031 12/13/15 1045 12/13/15 1100  BP:  126/63 140/96 126/58  Pulse:  74 75   Temp:   97.4 F (36.3 C) 98.2 F (36.8 C)  TempSrc:      Resp: 20 15 16    Height:      Weight:      SpO2:  100% 100%     Intake/Output Summary (Last 24 hours) at 12/13/15 1425 Last data filed at 12/13/15 1000  Gross per 24 hour  Intake   1200 ml  Output    200 ml  Net   1000 ml   Filed Weights   12/13/15 0105  Weight: 78 kg (171 lb 15.3 oz)    Examination:  General exam: Appears calm and comfortable, mildly confused  Respiratory system: Clear to auscultation. Respiratory effort normal. Cardiovascular system: S1 & S2 heard, RRR. No JVD, murmurs, rubs, gallops or clicks. No pedal edema. Gastrointestinal system: Abdomen is nondistended, soft and nontender. No organomegaly or masses felt. Normal bowel sounds heard. Central nervous system: Alert and oriented. No focal neurological deficits.  Extremities: surgical incision sites to right hip appeaqr clean no evidence of hematoma Skin: No rashes, lesions or ulcers Psychiatry: Judgement and insight appear normal. Mood & affect appropriate.     Data Reviewed: I have personally  reviewed following labs and imaging studies  CBC:  Recent Labs Lab 12/12/15 2109 12/13/15 0507  WBC 14.3* 17.5*  NEUTROABS 10.7*  --   HGB 11.1* 10.5*  HCT 35.5* 34.7*  MCV 79.6 80.7  PLT 294 292   Basic Metabolic Panel:  Recent Labs Lab 12/12/15 2228 12/13/15 0507  NA 138 140  K 4.3 4.7  CL 104 105  CO2 28 28  GLUCOSE 154* 136*  BUN 21* 18  CREATININE 1.35* 1.36*  CALCIUM 8.5* 8.2*  MG  --  1.8  PHOS  --  3.8   GFR: Estimated Creatinine Clearance: 25.5 mL/min (by C-G formula based on Cr of 1.36). Liver Function Tests:  Recent Labs Lab 12/13/15 0507  AST 20  ALT 11*  ALKPHOS 55  BILITOT 0.7  PROT 6.8  ALBUMIN 3.3*   No results for input(s): LIPASE, AMYLASE in the last 168 hours. No results for input(s): AMMONIA in the last 168 hours. Coagulation Profile:  Recent Labs Lab 12/12/15 2109  INR 0.91   Cardiac Enzymes: No results for input(s): CKTOTAL, CKMB, CKMBINDEX, TROPONINI in the last 168 hours. BNP (last 3 results) No results for input(s): PROBNP in the last 8760 hours. HbA1C: No results for input(s): HGBA1C in the last 72 hours. CBG: No results for input(s): GLUCAP in the last 168 hours. Lipid Profile: No results for input(s): CHOL, HDL, LDLCALC, TRIG, CHOLHDL, LDLDIRECT in the last 72 hours. Thyroid Function Tests:  Recent Labs  12/13/15 0507  TSH 0.887   Anemia Panel: No results for input(s): VITAMINB12, FOLATE, FERRITIN, TIBC, IRON, RETICCTPCT in the last 72 hours. Sepsis Labs: No results for input(s): PROCALCITON, LATICACIDVEN in the last 168 hours.  Recent Results (from the past 240 hour(s))  Surgical PCR screen     Status: None   Collection Time: 12/13/15  5:55 AM  Result Value Ref Range Status   MRSA, PCR NEGATIVE NEGATIVE Final   Staphylococcus aureus NEGATIVE NEGATIVE Final    Comment:        The Xpert SA Assay (FDA approved for NASAL specimens in patients over 24 years of age), is one component of a comprehensive  surveillance program.  Test performance has been validated by [facility] for patients greater than or equal to 38 year old. It is not intended to diagnose infection nor to guide or monitor treatment.          Radiology Studies: Dg Chest 1 View  12/12/2015  CLINICAL DATA:  Larey Seat, hip pain EXAM: CHEST 1 VIEW COMPARISON:  08/15/2015 FINDINGS: Large hiatal hernia. Heart size normal. Elevation of the right diaphragm. Right greater than left shoulder arthropathy. Lungs clear. No effusion. IMPRESSION: Numerous chronic findings with no acute abnormality. Electronically Signed   By: Esperanza Heir M.D.   On: 12/12/2015 21:57   Dg Pelvis 1-2 Views  12/12/2015  CLINICAL DATA:  Fall, right hip pain EXAM: PELVIS - 1-2 VIEW COMPARISON:  None. FINDINGS: Intertrochanteric right femur fracture as described in report of right femur. Pelvic bones intact. IMPRESSION: Right femur fracture Electronically Signed   By: Esperanza Heir M.D.   On: 12/12/2015 21:58   Pelvis Portable  12/13/2015  CLINICAL DATA:  Status post ORIF today. EXAM: PORTABLE PELVIS 1-2 VIEWS COMPARISON:  One day prior FINDINGS:  Placement of an intra medullary rod with proximal fixation screw across the previously described intratrochanteric femur fracture. Distal portion of the intramedullary rod is not imaged. No acute hardware complication. Osteopenia. Vascular calcifications. IMPRESSION: Interval fixation of previously described right femur fracture. Electronically Signed   By: Jeronimo Greaves M.D.   On: 12/13/2015 10:02   Dg C-arm 1-60 Min-no Report  12/13/2015  CLINICAL DATA: IM Nail right hip C-ARM 1-60 MINUTES Fluoroscopy was utilized by the requesting physician.  No radiographic interpretation.   Dg Hip Operative Unilat W Or W/o Pelvis Right  12/13/2015  CLINICAL DATA:  82 year old female with a history of hip fracture EXAM: OPERATIVE RIGHT HIP WITH PELVIS; DG C-ARM 1-60 MIN-NO REPORT COMPARISON:  12/12/2015 FINDINGS: Multiple  intraoperative fluoroscopic spot images during open reduction internal fixation of right hip fracture demonstrates placement of antegrade right femoral intra medullary rod with gamma nail fixation of the femoral neck. No immediate IMPRESSION: Intraoperative fluoroscopic spot images during open reduction internal fixation of right femoral neck fracture, with antegrade intra medullary rod placement and gamma nail fixation. Please refer to the dictated operative report for full details of intraoperative findings and procedure. Signed, Yvone Neu. Loreta Ave, DO [facility] Electronically Signed   By: Gilmer Mor D.O.   On: 12/13/2015 09:58   Dg Femur, Min 2 Views Right  12/12/2015  CLINICAL DATA:  Fall, femur pain EXAM: RIGHT FEMUR 2 VIEWS COMPARISON:  None. FINDINGS: Mildly impacted intertrochanteric right femur fracture with mild varus angulation. Extensive femoral artery calcification. IMPRESSION: Acute femur fracture Electronically Signed   By: Esperanza Heir M.D.   On: 12/12/2015 21:58   Dg Femur Port, Min 2 Views Right  12/13/2015  CLINICAL DATA:  Fracture repair. EXAM: RIGHT FEMUR PORTABLE 1 VIEW COMPARISON:  December 12, 2015 FINDINGS: A gamma nail and rod have been placed across the right hip fracture. Hardware is in good position, affixed distally with a single screw. IMPRESSION: Fracture repair. Electronically Signed   By: Gerome Sam III M.D   On: 12/13/2015 12:18        Scheduled Meds: . amiodarone  100 mg Oral Daily  . apixaban  2.5 mg Oral BID  .  ceFAZolin (ANCEF) IV  2 g Intravenous Q6H  . docusate sodium  100 mg Oral BID  . levothyroxine  25 mcg Intravenous Once  . [START ON 12/14/2015] levothyroxine  75 mcg Oral QAC breakfast  . senna  1 tablet Oral BID   Continuous Infusions:    LOS: 1 day    Time spent: 35 min    Jeralyn Bennett, MD [facility] Pager [phone]  If 7PM-7AM, please contact  night-coverage [email or web address] Password [email or web address] 12/13/2015, 2:25 PM

## 2015-12-13 NOTE — Transfer of Care (Signed)
Immediate Anesthesia Transfer of Care Note  Patient: Carla [name]  Procedure(s) Performed: Procedure(s): INTRAMEDULLARY (IM) NAIL INTERTROCHANTRIC (Right)  Patient Location: PACU  Anesthesia Type:Spinal  Level of Consciousness: awake, alert  and oriented  Airway & Oxygen Therapy: Patient Spontanous Breathing and Patient connected to face mask oxygen  Post-op Assessment: Report given to RN and Post -op Vital signs reviewed and stable  Post vital signs: Reviewed and stable  Last Vitals:  Filed Vitals:   12/13/15 0105 12/13/15 0613  BP: 164/84 130/80  Pulse: 79 76  Temp: 36.7 C 36.7 C  Resp: 18 18    Last Pain:  Filed Vitals:   12/13/15 0614  PainSc: Asleep      Patients Stated Pain Goal: 3 (12/13/15 0103)  Complications: No apparent anesthesia complications

## 2015-12-14 DIAGNOSIS — N179 Acute kidney failure, unspecified: Secondary | ICD-10-CM

## 2015-12-14 LAB — CBC
HCT: 28 % — ABNORMAL LOW (ref 36.0–46.0)
Hemoglobin: 8.6 g/dL — ABNORMAL LOW (ref 12.0–15.0)
MCH: 24.9 pg — ABNORMAL LOW (ref 26.0–34.0)
MCHC: 30.7 g/dL (ref 30.0–36.0)
MCV: 80.9 fL (ref 78.0–100.0)
PLATELETS: 220 10*3/uL (ref 150–400)
RBC: 3.46 MIL/uL — AB (ref 3.87–5.11)
RDW: 16.5 % — AB (ref 11.5–15.5)
WBC: 13.6 10*3/uL — AB (ref 4.0–10.5)

## 2015-12-14 LAB — BASIC METABOLIC PANEL
ANION GAP: 6 (ref 5–15)
BUN: 18 mg/dL (ref 6–20)
CALCIUM: 7.7 mg/dL — AB (ref 8.9–10.3)
CO2: 28 mmol/L (ref 22–32)
Chloride: 104 mmol/L (ref 101–111)
Creatinine, Ser: 1.55 mg/dL — ABNORMAL HIGH (ref 0.44–1.00)
GFR, EST AFRICAN AMERICAN: 33 mL/min — AB (ref >60)
GFR, EST NON AFRICAN AMERICAN: 28 mL/min — AB (ref >60)
Glucose, Bld: 119 mg/dL — ABNORMAL HIGH (ref 65–99)
POTASSIUM: 4.4 mmol/L (ref 3.5–5.1)
SODIUM: 138 mmol/L (ref 135–145)

## 2015-12-14 MED ORDER — NITROFURANTOIN MACROCRYSTAL 50 MG PO CAPS
50.0000 mg | ORAL_CAPSULE | Freq: Every day | ORAL | Status: DC
  Administered 2015-12-14 – 2015-12-15 (×2): 50 mg via ORAL
  Filled 2015-12-14 (×2): qty 1

## 2015-12-14 MED ORDER — SODIUM CHLORIDE 0.9 % IV BOLUS (SEPSIS)
500.0000 mL | Freq: Once | INTRAVENOUS | Status: AC
  Administered 2015-12-14: 500 mL via INTRAVENOUS

## 2015-12-14 MED ORDER — HYDROCODONE-ACETAMINOPHEN 5-325 MG PO TABS: 1.0000 | ORAL_TABLET | Freq: Four times a day (QID) | ORAL | Status: DC | PRN

## 2015-12-14 MED ORDER — OXYCODONE HCL 5 MG PO TABS
5.0000 mg | ORAL_TABLET | Freq: Four times a day (QID) | ORAL | Status: DC | PRN
  Administered 2015-12-14 – 2015-12-15 (×2): 5 mg via ORAL
  Filled 2015-12-14 (×2): qty 1

## 2015-12-14 MED ORDER — ACETAMINOPHEN 500 MG PO TABS
1000.0000 mg | ORAL_TABLET | Freq: Three times a day (TID) | ORAL | Status: DC
  Administered 2015-12-14 (×2): 1000 mg via ORAL
  Filled 2015-12-14 (×2): qty 2

## 2015-12-14 MED ORDER — APIXABAN 2.5 MG PO TABS: 2.5000 mg | ORAL_TABLET | Freq: Two times a day (BID) | ORAL | Status: DC

## 2015-12-14 MED ORDER — SODIUM CHLORIDE 0.9 % IV SOLN
INTRAVENOUS | Status: DC
  Administered 2015-12-14 – 2015-12-16 (×3): via INTRAVENOUS

## 2015-12-14 MED ORDER — SODIUM CHLORIDE 0.9 % IV BOLUS (SEPSIS)
250.0000 mL | Freq: Once | INTRAVENOUS | Status: AC
  Administered 2015-12-14: 250 mL via INTRAVENOUS

## 2015-12-14 NOTE — Progress Notes (Signed)
Pt desat to 50s on room air when night time vitals were checked by tech. This RN placed patient on 5 L nasal cannula, sats picked up quickly to 98%. RN placed pt on 2 L, has maintained 94-97% all night. Will continue to monitor closely.

## 2015-12-14 NOTE — Evaluation (Signed)
Physical Therapy Evaluation Patient Details Name: Carla [name] MRN: [id number] DOB: January 14, 1922 Today's Date: 12/14/2015   History of Present Illness  82 yo female 82 yo female s/p IM nail R IT femur 12/13/15, A fib.  Hx of PE, R LE DVT, IVC filter, A fib, osteoporosis, scolisos.   Clinical Impression  On eval, pt required Total assist +2 for mobility. Pt was able to sit EOB with Min guard assist once positioned there. Mobility is limited by pain and weakness. Family present during session. Recommend SNF for continued rehab.     Follow Up Recommendations SNF    Equipment Recommendations  Rolling walker with 5" wheels    Recommendations for Other Services       Precautions / Restrictions Precautions Precautions: Fall Restrictions Weight Bearing Restrictions: Yes RLE Weight Bearing: Partial weight bearing RLE Partial Weight Bearing Percentage or Pounds: 50%      Mobility  Bed Mobility Overal bed mobility: Needs Assistance Bed Mobility: Supine to Sit;Sit to Supine     Supine to sit: Total assist;+2 for physical assistance;+2 for safety/equipment;HOB elevated Sit to supine: +2 for physical assistance;Total assist;+2 for safety/equipment   General bed mobility comments: Assist for trunk and bil LEs. Utilized bedpad for scooting, positioning.   Transfers                 General transfer comment: NT-pt unable on today  Ambulation/Gait             General Gait Details: NT-pt unable on today  Stairs            Wheelchair Mobility    Modified Rankin (Stroke Patients Only)       Balance Overall balance assessment: History of Falls;Needs assistance   Sitting balance-Leahy Scale: Fair Sitting balance - Comments: Sat EOB at least 10 minutes with Min guard assist. Pt was able to brush teeth while sitting EOB without LOB     Standing balance-Leahy Scale: Zero                               Pertinent Vitals/Pain Pain Assessment: Faces Faces Pain  Scale: Hurts whole lot Pain Location: R LE with activity Pain Descriptors / Indicators: Sharp;Sore;Aching Pain Intervention(s): Limited activity within patient's tolerance;Repositioned    Home Living Family/patient expects to be discharged to:: Skilled nursing facility Living Arrangements: Alone Available Help at Discharge: Family;Available PRN/intermittently Type of Home: House Home Access: Stairs to enter   Entrance Stairs-Number of Steps: 1 Home Layout: Two level;Able to live on main level with bedroom/bathroom Home Equipment: Walker - 4 wheels;Cane - single point;Shower seat      Prior Function Level of Independence: Independent with assistive device(s)         Comments: Uses rollator for ambulation.  Does not drive.  Patient is incontinent of bladder, so is up at night changing.     Hand Dominance        Extremity/Trunk Assessment   Upper Extremity Assessment: Generalized weakness (noted crepitus in shoulder during bed mobility)           Lower Extremity Assessment: RLE deficits/detail;LLE deficits/detail RLE Deficits / Details: moves ankle well LLE Deficits / Details: at least 3/5  Cervical / Trunk Assessment: Kyphotic  Communication   Communication: No difficulties  Cognition Arousal/Alertness: Awake/alert Behavior During Therapy: WFL for tasks assessed/performed Overall Cognitive Status: Within Functional Limits for tasks assessed  General Comments      Exercises General Exercises - Lower Extremity Ankle Circles/Pumps: AROM;Both;10 reps;Supine;Seated Long Arc Quad: AROM;Left;10 reps;Seated      Assessment/Plan    PT Assessment Patient needs continued PT services  PT Diagnosis Difficulty walking;Generalized weakness;Acute pain   PT Problem List Decreased strength;Decreased range of motion;Decreased activity tolerance;Decreased balance;Decreased mobility;Decreased knowledge of use of DME  PT Treatment Interventions  DME instruction;Functional mobility training;Therapeutic activities;Patient/family education;Balance training;Therapeutic exercise   PT Goals (Current goals can be found in the Care Plan section) Acute Rehab PT Goals Patient Stated Goal: Regain PLOF. Less pain.  PT Goal Formulation: With patient/family Time For Goal Achievement: 12/28/15 Potential to Achieve Goals: Good    Frequency Min 3X/week   Barriers to discharge        Co-evaluation               End of Session   Activity Tolerance: Patient limited by pain Patient left: in bed;with call bell/phone within reach;with bed alarm set;with family/visitor present           Time: [id number] PT Time Calculation (min) (ACUTE ONLY): 30 min   Charges:   PT Evaluation $PT Eval Low Complexity: 1 Procedure PT Treatments $Therapeutic Activity: 8-22 mins   PT G Codes:        Rebeca AlertJannie Jasmyn Picha, MPT Pager: [phone]

## 2015-12-14 NOTE — Progress Notes (Signed)
RT placed PT on 40% (8 lpm) VM due to low Sp02 <88% due to mouth breathing on 4 lpm [place]. Sp02 on 40% = >97%, RT titrated VM down to 35% (6 lpm ), current Sp02 95%. RN aware.

## 2015-12-14 NOTE — Op Note (Signed)
[name], [name] [name].:  [id number]  MEDICAL RECORD [name].:  [id number]  LOCATION:  1414                         FACILITY:  [facility]  PHYSICIAN:  Samson Frederic, MD     DATE OF BIRTH:  1921-11-22  DATE OF PROCEDURE:  12/13/2015 DATE OF DISCHARGE:                              OPERATIVE REPORT   SURGEON:  Samson Frederic, MD.  ASSISTANT:  Irish Elders, PAC.  PREOPERATIVE DIAGNOSIS:  Right intertrochanteric femur fracture.  POSTOPERATIVE DIAGNOSIS:  Right intertrochanteric femur fracture.  PROCEDURE PERFORMED:  Intramedullary fixation of right intertrochanteric femur fracture.  IMPLANTS: 1. Synthes TFNA nail size 10 x 380 mm, 130 degrees. 2. 95 mm TFN helical blade. 3. 5 mm x 38 mm distal interlocking screw x1.  ANESTHESIA:  Spinal.  ESTIMATED BLOOD LOSS:  100 mL.  COMPLICATIONS:  None.  TUBES AND DRAINS:  None.  SPECIMENS:  None.  DISPOSITION:  Stable to PACU.  INDICATIONS:  The patient is a 82 year old female with multiple medical problems including recent bilateral pulmonary embolisms and completed 3 months of Coumadin.  She has an IVC filter in place.  She had a ground- level fall yesterday, fell on her right hip.  She was unable to weight bear.  She came to the emergency department.  X-rays revealed a right intertrochanteric femur fracture that was displaced.  She was admitted to the hospitalist service underwent perioperative risk stratification, medical optimization. Risks, benefits, and alternatives to surgical fixation were explained.  She elected to proceed.  DESCRIPTION OF PROCEDURE IN DETAIL:  I identified the patient in the holding area using 2 identifiers.  I marked the surgical site.  She was taken to the operating room.  Spinal anesthesia was induced.  She was transferred to the [facility] table.  Left lower extremity was scissored underneath the right.  I reduced the fracture with standard traction, internal rotation, and adduction.   The right hip was prepped and draped in normal sterile surgical fashion.  The patient did receive 2 g of IV Ancef within 60 minutes of beginning the procedure.  Time-out was called verifying the side and site of surgery.  I began by using fluoroscopy to define her anatomy.  I made a 4 cm incision proximal to the tip of the trochanter.  I used the awl to determine the standard starting point for trochanteric entry nail.  I placed a guide pin and then used the entry reamer to gain access to the canal.  I placed a guidewire down to the physeal scar of the knee.  I sequentially reamed to an 11.5, with excellent chatter.  The real 10 mm nail was selected.  After I measured the length, the nail was assembled on the jig.  The nail was inserted without any difficulty to the appropriate depth through a separate stab incision.  I inserted the cannula through the jig down to the bone for the cephalomedullary device.  I placed a guide pin under AP and lateral fluoroscopic control.  I measured reamed and placed the real blade device.  I removed the jig.  I then turned my attention distally.  I used perfect circle technique to place a single distal interlocking screw.  Final AP and lateral fluoroscopy views were obtained confirming near anatomic fracture reduction and appropriate hardware placement.  Tip-apex distance was appropriate.  There was [name] chondral penetration.  The patient was aroused from anesthesia, transferred to the stretcher and taken to PACU in stable condition. Sponge, needle, and instrument counts were correct the case x2.  There were [name] known complications.  Postoperatively, we will readmit the patient to the hospitalist.  She may be 50% weightbearing right lower extremity with a walker.  We will place her on apixaban for DVT prophylaxis.  She will work with physical and occupational therapy.  She will need disposition planning, likely skilled nursing facility placement.  I will  plan to see her in the office 2 weeks after discharge.          ______________________________ Samson FredericBrian Britteney Ayotte, MD     BS/MEDQ  D:  12/13/2015  T:  12/14/2015  Job:  [id number]

## 2015-12-14 NOTE — Progress Notes (Signed)
PT Cancellation Note  Patient Details Name: Alfonzo Fellerarline S Marinez MRN: [id number] DOB: 11-01-1921   Cancelled Treatment:    Reason Eval/Treat Not Completed: Medical issues which prohibited therapy. Per family, pt to receive IV fluids due to BP being a little low this am. Will check back later this pm for PT eval.     Rebeca AlertJannie Florentine Diekman, MPT Pager: [phone]

## 2015-12-14 NOTE — Progress Notes (Signed)
Pt BP 100/40 when checked manually, on-call paged and given orders for 250 cc bolus. Will recheck bolus after fluids and chart in epic. Will continue to monitor.

## 2015-12-14 NOTE — Progress Notes (Signed)
   Subjective: 1 Day Post-Op Procedure(s) (LRB): INTRAMEDULLARY (IM) NAIL INTERTROCHANTRIC (Right) Patient reports pain as mild.   Patient seen in rounds for Dr. Linna CapriceSwinteck. Patient is well, and has had no acute complaints or problems. No issues overnight. Reports that she is ready to get up with therapy today.   Objective: Vital signs in last 24 hours: Temp:  [97.4 F (36.3 C)-99.7 F (37.6 C)] 99.7 F (37.6 C) (06/11 0524) Pulse Rate:  [68-88] 86 (06/11 0524) Resp:  [14-20] 18 (06/11 0524) BP: (93-148)/(40-100) 128/54 mmHg (06/11 0658) SpO2:  [50 %-100 %] 93 % (06/11 0524)  Intake/Output from previous day:  Intake/Output Summary (Last 24 hours) at 12/14/15 0836 Last data filed at 12/14/15 0700  Gross per 24 hour  Intake 1611.67 ml  Output    925 ml  Net 686.67 ml     Labs:  Recent Labs  12/12/15 2109 12/13/15 0507 12/14/15 0535  HGB 11.1* 10.5* 8.6*    Recent Labs  12/13/15 0507 12/14/15 0535  WBC 17.5* 13.6*  RBC 4.30 3.46*  HCT 34.7* 28.0*  PLT 292 220    Recent Labs  12/13/15 0507 12/14/15 0535  NA 140 138  K 4.7 4.4  CL 105 104  CO2 28 28  BUN 18 18  CREATININE 1.36* 1.55*  GLUCOSE 136* 119*  CALCIUM 8.2* 7.7*    Recent Labs  12/12/15 2109  INR 0.91    EXAM General - Patient is Alert and Oriented Extremity - Neurologically intact Intact pulses distally Dorsiflexion/Plantar flexion intact Compartment soft Dressing/Incision - clean, dry, no drainage Motor Function - intact, moving foot and toes well on exam.   Past Medical History  Diagnosis Date  . Iron deficiency anemia   . High cholesterol   . Osteoarthritis   . Female bladder prolapse   . Osteoporosis   . Cataracts, bilateral   . Gallstones   . Hemorrhoids   . Diverticulosis   . Hiatal hernia   . Atrial fibrillation (HCC)   . Cystocele   . Rectocele   . Scoliosis   . Lordosis   . Hyperlipidemia   . Osteoarthritis   . Dysrhythmia     A- fib  . Pneumonia     hx  aspiration pneumonia  . Hypothyroidism   . Chronic kidney disease     chronic kidney disease  . Encounter for fitting and adjustment of pessary 04/23/2002  . Renal insufficiency     Assessment/Plan: 1 Day Post-Op Procedure(s) (LRB): INTRAMEDULLARY (IM) NAIL INTERTROCHANTRIC (Right) Active Problems:   Atrial fibrillation (HCC)   Leukocytosis   Hypothyroid   Closed right femoral fracture (HCC)   Femur fracture, right (HCC)   Fracture, intertrochanteric, right femur (HCC)  Estimated body mass index is 32.51 kg/(m^2) as calculated from the following:   Height as of this encounter: 5\' 1"  (1.549 m).   Weight as of this encounter: 78 kg (171 lb 15.3 oz). Advance diet Up with therapy PWB 50% DVT Prophylaxis - Lovenox   Up with therapy today. Likely will be ready for DC from ortho standpoint on Monday  Dimitri PedAmber Grisela Mesch, PA-C Orthopaedic Surgery 12/14/2015, 8:36 AM

## 2015-12-14 NOTE — Progress Notes (Signed)
PROGRESS NOTE    [name]  WUJ:[id number] DOB: [birth year] DOA: 12/12/2015 PCP: [name], MD   Brief Narrative:  Carla [name] is a 82 year old female with a complex past medical history including atrial fibrillation, history of urosepsis, history of upper GI bleeding, hypothyroidism, pulmonary embolism status post IVC filter placement, admitted to medicine service on 12/13/2015 which presented after having a fall at home. Carla [name] reportedly bent over to pick up newspaper lost balance and fell on Carla [name] right hip. Was unable to bear weight afterwards which Carla [name] was brought to the emergency room. Imaging studies revealed an acute intertrochanteric right femoral fracture.   Assessment & Plan:   Active Problems:   Atrial fibrillation (HCC)   Leukocytosis   Hypothyroid   Closed right femoral fracture (HCC)   Femur fracture, right (HCC)   Fracture, intertrochanteric, right femur (HCC)   1.  Intertrochanteric right femur fracture -Patient is a pleasant 82 year old with multiple medical problems, having a fall at home landing on Carla [name] right side and injuring Carla [name] right hip. -Imaging studies performed in the emergency department revealing an intertrochanteric femoral fracture of right hip. -Orthopedic surgery was consulted Carla [name] was taken to the operating room on 12/13/2015 undergoing intramedullary nail placement. Procedure performed by Dr. Linna Caprice orthopedic surgery. -Physical therapy consultation, will likely require skilled nursing facility placement. Social work consult for disposition. -Repeat lab work showing Hg of 8.6 -Looks dry will provide IV fluids  2.  Atrial fibrillation -CHADSVasc score of 4 -Having history of GI bleed Carla [name] had not been chronically anticoagulated prior to this hospitalization -Ventricular rates in the 70s to 80s on telemetry. -Continue amiodarone 100 mg by mouth daily  3.  Leukocytosis. -Lab work showing a white count of 17,500, suspect related to hip  fracture. -Otherwise Carla [name] is afebrile and hemodynamically stable -Labs showing downward trend in Hg to 13.6  4. History of bilateral pulmonary emboli -Had been anticoagulated with warfarin which was discontinued after 3 months of therapy by Carla [name] hematologist at [facility] -Now on eliquis for DVT prophylaxis  5.  Hypothyroidism.  -Continue thyroid replacement therapy  6.  Acute renal failure -Labs showing upward trend in Carla [name] creatinine to 1.55 from 1.36 -I think Carla [name] is dehydrated, was NPO yesterday for procedure -Will give 500 mL bolus of NS and run maintenance fluids at 75 mL/hour   DVT prophylaxis: Eliquis Code Status: Full code Family Communication: Spoke with Carla [name] daughter was present at bedside Disposition Plan: Patient will likely require nursing facility placement  Consultants:   Orthopedic surgery  Procedures:  INTRAMEDULLARY (IM) NAIL INTERTROCHANTRIC (Right)  Subjective: Carla [name] looks a little oversedated, Carla [name] is arousable. RN reported Carla [name] was given Norvo for pain management  Objective: Filed Vitals:   12/14/15 0524 12/14/15 0535 12/14/15 0658 12/14/15 0851  BP: 104/45 100/40 128/54 103/40  Pulse: 86     Temp: 99.7 F (37.6 C)     TempSrc: Oral     Resp: 18     Height:      Weight:      SpO2: 93%       Intake/Output Summary (Last 24 hours) at 12/14/15 0948 Last data filed at 12/14/15 0700  Gross per 24 hour  Intake 611.67 ml  Output    775 ml  Net -163.33 ml   Filed Weights   12/13/15 0105  Weight: 78 kg (171 lb 15.3 oz)    Examination:  General exam: Looks sedated Respiratory system: Clear to auscultation. Respiratory effort normal.  Cardiovascular system: S1 & S2 heard, RRR. No JVD, murmurs, rubs, gallops or clicks. No pedal edema. Gastrointestinal system: Abdomen is nondistended, soft and nontender. No organomegaly or masses felt. Normal bowel sounds heard. Central nervous system: Alert and oriented. No focal neurological  deficits. Extremities: surgical incision sites to right hip appeaqr clean no evidence of hematoma Skin: No rashes, lesions or ulcers Psychiatry: Judgement and insight appear normal. Mood & affect appropriate.     Data Reviewed: I have personally reviewed following labs and imaging studies  CBC:  Recent Labs Lab 12/12/15 2109 12/13/15 0507 12/14/15 0535  WBC 14.3* 17.5* 13.6*  NEUTROABS 10.7*  --   --   HGB 11.1* 10.5* 8.6*  HCT 35.5* 34.7* 28.0*  MCV 79.6 80.7 80.9  PLT 294 292 220   Basic Metabolic Panel:  Recent Labs Lab 12/12/15 2228 12/13/15 0507 12/14/15 0535  NA 138 140 138  K 4.3 4.7 4.4  CL 104 105 104  CO2 28 28 28   GLUCOSE 154* 136* 119*  BUN 21* 18 18  CREATININE 1.35* 1.36* 1.55*  CALCIUM 8.5* 8.2* 7.7*  MG  --  1.8  --   PHOS  --  3.8  --    GFR: Estimated Creatinine Clearance: 22.4 mL/min (by C-G formula based on Cr of 1.55). Liver Function Tests:  Recent Labs Lab 12/13/15 0507  AST 20  ALT 11*  ALKPHOS 55  BILITOT 0.7  PROT 6.8  ALBUMIN 3.3*   No results for input(s): LIPASE, AMYLASE in the last 168 hours. No results for input(s): AMMONIA in the last 168 hours. Coagulation Profile:  Recent Labs Lab 12/12/15 2109  INR 0.91   Cardiac Enzymes: No results for input(s): CKTOTAL, CKMB, CKMBINDEX, TROPONINI in the last 168 hours. BNP (last 3 results) No results for input(s): PROBNP in the last 8760 hours. HbA1C: No results for input(s): HGBA1C in the last 72 hours. CBG: No results for input(s): GLUCAP in the last 168 hours. Lipid Profile: No results for input(s): CHOL, HDL, LDLCALC, TRIG, CHOLHDL, LDLDIRECT in the last 72 hours. Thyroid Function Tests:  Recent Labs  12/13/15 0507  TSH 0.887   Anemia Panel: No results for input(s): VITAMINB12, FOLATE, FERRITIN, TIBC, IRON, RETICCTPCT in the last 72 hours. Sepsis Labs: No results for input(s): PROCALCITON, LATICACIDVEN in the last 168 hours.  Recent Results (from the past 240  hour(s))  Surgical PCR screen     Status: None   Collection Time: 12/13/15  5:55 AM  Result Value Ref Range Status   MRSA, PCR NEGATIVE NEGATIVE Final   Staphylococcus aureus NEGATIVE NEGATIVE Final    Comment:        The Xpert SA Assay (FDA approved for NASAL specimens in patients over 80 years of age), is one component of a comprehensive surveillance program.  Test performance has been validated by [facility] for patients greater than or equal to 80 year old. It is not intended to diagnose infection nor to guide or monitor treatment.          Radiology Studies: Dg Chest 1 View  12/12/2015  CLINICAL DATA:  Larey SeatFell, hip pain EXAM: CHEST 1 VIEW COMPARISON:  08/15/2015 FINDINGS: Large hiatal hernia. Heart size normal. Elevation of the right diaphragm. Right greater than left shoulder arthropathy. Lungs clear. No effusion. IMPRESSION: Numerous chronic findings with no acute abnormality. Electronically Signed   By: Esperanza Heiraymond  Rubner M.D.   On: 12/12/2015 21:57   Dg Pelvis 1-2 Views  12/12/2015  CLINICAL DATA:  Fall, right hip pain EXAM: PELVIS - 1-2 VIEW COMPARISON:  None. FINDINGS: Intertrochanteric right femur fracture as described in report of right femur. Pelvic bones intact. IMPRESSION: Right femur fracture Electronically Signed   By: Esperanza Heir M.D.   On: 12/12/2015 21:58   Pelvis Portable  12/13/2015  CLINICAL DATA:  Status post ORIF today. EXAM: PORTABLE PELVIS 1-2 VIEWS COMPARISON:  One day prior FINDINGS: Placement of an intra medullary rod with proximal fixation screw across the previously described intratrochanteric femur fracture. Distal portion of the intramedullary rod is not imaged. No acute hardware complication. Osteopenia. Vascular calcifications. IMPRESSION: Interval fixation of previously described right femur fracture. Electronically Signed   By: Jeronimo Greaves M.D.   On: 12/13/2015 10:02   Dg C-arm 1-60 Min-no Report  12/13/2015  CLINICAL DATA: IM Nail right hip  C-ARM 1-60 MINUTES Fluoroscopy was utilized by the requesting physician.  No radiographic interpretation.   Dg Hip Operative Unilat W Or W/o Pelvis Right  12/13/2015  CLINICAL DATA:  82 year old female with a history of hip fracture EXAM: OPERATIVE RIGHT HIP WITH PELVIS; DG C-ARM 1-60 MIN-NO REPORT COMPARISON:  12/12/2015 FINDINGS: Multiple intraoperative fluoroscopic spot images during open reduction internal fixation of right hip fracture demonstrates placement of antegrade right femoral intra medullary rod with gamma nail fixation of the femoral neck. No immediate IMPRESSION: Intraoperative fluoroscopic spot images during open reduction internal fixation of right femoral neck fracture, with antegrade intra medullary rod placement and gamma nail fixation. Please refer to the dictated operative report for full details of intraoperative findings and procedure. Signed, Yvone Neu. Loreta Ave, DO [facility] Electronically Signed   By: Gilmer Mor D.O.   On: 12/13/2015 09:58   Dg Femur, Min 2 Views Right  12/12/2015  CLINICAL DATA:  Fall, femur pain EXAM: RIGHT FEMUR 2 VIEWS COMPARISON:  None. FINDINGS: Mildly impacted intertrochanteric right femur fracture with mild varus angulation. Extensive femoral artery calcification. IMPRESSION: Acute femur fracture Electronically Signed   By: Esperanza Heir M.D.   On: 12/12/2015 21:58   Dg Femur [place], Min 2 Views Right  12/13/2015  CLINICAL DATA:  Fracture repair. EXAM: RIGHT FEMUR PORTABLE 1 VIEW COMPARISON:  December 12, 2015 FINDINGS: A gamma nail and rod have been placed across the right hip fracture. Hardware is in good position, affixed distally with a single screw. IMPRESSION: Fracture repair. Electronically Signed   By: Gerome Sam III M.D   On: 12/13/2015 12:18        Scheduled Meds: . amiodarone  100 mg Oral Daily  . apixaban  2.5 mg Oral BID  . docusate sodium  100 mg Oral BID  . levothyroxine   25 mcg Intravenous Once  . levothyroxine  75 mcg Oral QAC breakfast  . nitrofurantoin  50 mg Oral Daily  . pantoprazole  40 mg Oral Daily  . senna  1 tablet Oral BID  . sodium chloride  500 mL Intravenous Once   Continuous Infusions: . sodium chloride       LOS: 2 days    Time spent: 35 min    Jeralyn Bennett, MD [facility] Pager [phone]  If 7PM-7AM, please contact night-coverage [email or web address] Password [facility] 12/14/2015, 9:48 AM

## 2015-12-15 ENCOUNTER — Encounter (HOSPITAL_COMMUNITY): Payer: Self-pay | Admitting: Orthopedic Surgery

## 2015-12-15 LAB — BASIC METABOLIC PANEL
ANION GAP: 7 (ref 5–15)
BUN: 20 mg/dL (ref 6–20)
CALCIUM: 7.6 mg/dL — AB (ref 8.9–10.3)
CO2: 24 mmol/L (ref 22–32)
Chloride: 106 mmol/L (ref 101–111)
Creatinine, Ser: 1.39 mg/dL — ABNORMAL HIGH (ref 0.44–1.00)
GFR, EST AFRICAN AMERICAN: 37 mL/min — AB (ref >60)
GFR, EST NON AFRICAN AMERICAN: 32 mL/min — AB (ref >60)
GLUCOSE: 129 mg/dL — AB (ref 65–99)
Potassium: 4.4 mmol/L (ref 3.5–5.1)
Sodium: 137 mmol/L (ref 135–145)

## 2015-12-15 LAB — CBC
HCT: 25.7 % — ABNORMAL LOW (ref 36.0–46.0)
Hemoglobin: 7.8 g/dL — ABNORMAL LOW (ref 12.0–15.0)
MCH: 24.1 pg — ABNORMAL LOW (ref 26.0–34.0)
MCHC: 30.4 g/dL (ref 30.0–36.0)
MCV: 79.3 fL (ref 78.0–100.0)
PLATELETS: 185 10*3/uL (ref 150–400)
RBC: 3.24 MIL/uL — ABNORMAL LOW (ref 3.87–5.11)
RDW: 16.5 % — AB (ref 11.5–15.5)
WBC: 18.7 10*3/uL — AB (ref 4.0–10.5)

## 2015-12-15 LAB — PREPARE RBC (CROSSMATCH)

## 2015-12-15 MED ORDER — HYDROCODONE-ACETAMINOPHEN 5-325 MG PO TABS
1.0000 | ORAL_TABLET | ORAL | Status: DC | PRN
  Administered 2015-12-15 – 2015-12-16 (×6): 1 via ORAL
  Filled 2015-12-15 (×6): qty 1

## 2015-12-15 MED ORDER — HYDROCODONE-ACETAMINOPHEN 5-325 MG PO TABS
1.0000 | ORAL_TABLET | ORAL | Status: DC | PRN
Start: 2015-12-15 — End: 2015-12-15

## 2015-12-15 MED ORDER — SODIUM CHLORIDE 0.9 % IV SOLN
Freq: Once | INTRAVENOUS | Status: AC
Start: 2015-12-15 — End: 2015-12-15
  Administered 2015-12-15: 12:00:00 via INTRAVENOUS

## 2015-12-15 NOTE — Progress Notes (Signed)
Physical Therapy Treatment Patient Details Name: OCIA SIMEK MRN: [id number] DOB: 1922-02-21 Today's Date: 12/15/2015    History of Present Illness 82 yo female s/p IM nail R IT femur 12/13/15, A fib.  Hx of PE, R LE DVT, IVC filter, A fib, osteoporosis, scolisos.     PT Comments    Pt assisted to sitting EOB however declined attempting to stand today due to pain.  Continue to recommend SNF.  Follow Up Recommendations  SNF;Supervision/Assistance - 24 hour     Equipment Recommendations  Rolling walker with 5" wheels    Recommendations for Other Services       Precautions / Restrictions Precautions Precautions: Fall Restrictions Weight Bearing Restrictions: Yes RLE Weight Bearing: Partial weight bearing RLE Partial Weight Bearing Percentage or Pounds: 50%    Mobility  Bed Mobility Overal bed mobility: Needs Assistance;+2 for physical assistance Bed Mobility: Supine to Sit;Sit to Supine     Supine to sit: Total assist;+2 for physical assistance;+2 for safety/equipment;HOB elevated Sit to supine: +2 for physical assistance;Total assist;+2 for safety/equipment   General bed mobility comments: increased assist for upper and lower body despite time and multimodal cues provided  Transfers Overall transfer level:  (pt unwilling to attempt today)                  Ambulation/Gait                 Stairs            Wheelchair Mobility    Modified Rankin (Stroke Patients Only)       Balance Overall balance assessment: Needs assistance;History of Falls   Sitting balance-Leahy Scale: Fair Sitting balance - Comments: pt able to sit EOB with UE support min/guard                             Cognition Arousal/Alertness: Awake/alert Behavior During Therapy: WFL for tasks assessed/performed Overall Cognitive Status: Within Functional Limits for tasks assessed                      Exercises      General Comments         Pertinent Vitals/Pain Pain Assessment: Faces Faces Pain Scale: Hurts even more Pain Location: R LE with movement Pain Descriptors / Indicators: Sore;Tender;Discomfort;Grimacing Pain Intervention(s): Limited activity within patient's tolerance;Monitored during session;Repositioned;Premedicated before session;Ice applied  Pt remained on O2 [place] during session. Daughter reports pt to receive blood today.  No dizziness upon sitting.    Home Living                      Prior Function            PT Goals (current goals can now be found in the care plan section) Progress towards PT goals: Progressing toward goals    Frequency  Min 3X/week    PT Plan Current plan remains appropriate    Co-evaluation             End of Session Equipment Utilized During Treatment: Oxygen Activity Tolerance: Patient limited by pain Patient left: in bed;with call bell/phone within reach;with bed alarm set;with family/visitor present     Time: 0623-7628 PT Time Calculation (min) (ACUTE ONLY): 22 min  Charges:  $Therapeutic Activity: 8-22 mins                    G Codes:  Jayvan Mcshan,KATHrine E 12/15/2015, 12:37 PM Zenovia JarredKati Kedric Bumgarner, PT, DPT 12/15/2015 Pager: [phone]

## 2015-12-15 NOTE — Progress Notes (Signed)
PROGRESS NOTE    DEVYN [name]  QMV:[id number] DOB: 12-22-1921 DOA: 12/12/2015 PCP: Carla Inch, MD   Brief Narrative:  Carla [name] is a 82 year old female with a complex past medical history including atrial fibrillation, history of urosepsis, history of upper GI bleeding, hypothyroidism, pulmonary embolism status post IVC filter placement, admitted to medicine service on 12/13/2015 which presented after having a fall at home. She reportedly bent over to pick up newspaper lost balance and fell on her right hip. Was unable to bear weight afterwards which she was brought to the emergency room. Imaging studies revealed an acute intertrochanteric right femoral fracture.   Assessment & Plan:   Active Problems:   Atrial fibrillation (HCC)   Leukocytosis   Hypothyroid   Closed right femoral fracture (HCC)   Femur fracture, right (HCC)   Fracture, intertrochanteric, right femur (HCC)   1.  Intertrochanteric right femur fracture -Patient is a pleasant 82 year old with multiple medical problems, having a fall at home landing on her right side and injuring her right hip. -Imaging studies performed in the emergency department revealing an intertrochanteric femoral fracture of right hip. -Orthopedic surgery was consulted she was taken to the operating room on 12/13/2015 undergoing intramedullary nail placement. Procedure performed by Dr. Linna Caprice orthopedic surgery. -Physical therapy consultation, will likely require skilled nursing facility placement. Social work consult for disposition. -Family feels pain is not well controlled. Had been initially on norco however had issues with oversedation and hypoxemia, for which I attempted to manage pain with scheduled tylenol and prn oxycodone.  -Ortho managing pain regimen.    2.  Atrial fibrillation -CHADSVasc score of 4 -Having history of GI bleed she had not been chronically anticoagulated prior to this hospitalization -Ventricular rates in  the 70s to 80s on telemetry. -Continue amiodarone 100 mg by mouth daily  3.  Leukocytosis. -Lab work showing a white count of 17,500, suspect related to hip fracture. -Otherwise she is afebrile and hemodynamically stable -She remains afebrile  4. History of bilateral pulmonary emboli -Had been anticoagulated with warfarin which was discontinued after 3 months of therapy by her hematologist at [facility] -Now on eliquis for DVT prophylaxis  5.  Hypothyroidism.  -Continue thyroid replacement therapy  6.  Acute renal failure -Labs showing improvement to renal function having Cr of 1.39 from 1.55 with IV fluids  7.  Post Operative Anemia -Lab work showing downward trend in Hg 7.8, will type and cross and transfuse 1 units of PRBC's   DVT prophylaxis: Eliquis Code Status: Full code Family Communication: Spoke with her daughter was present at bedside Disposition Plan: Patient will likely require nursing facility placement, anticipate discharge in the next 24 hours  Consultants:   Orthopedic surgery  Procedures:  INTRAMEDULLARY (IM) NAIL INTERTROCHANTRIC (Right)  Subjective: Carla Burbano is more awake today has ongoing hip pain  Objective: Filed Vitals:   12/15/15 1040 12/15/15 1041 12/15/15 1126 12/15/15 1141  BP: 118/47 118/47 145/42 145/49  Pulse: 83 83 99 97  Temp: 98.1 F (36.7 C) 98.1 F (36.7 C) 98.2 F (36.8 C) 98.1 F (36.7 C)  TempSrc: Oral Oral Oral Oral  Resp: 20 20 18 18   Height:      Weight:      SpO2: 100% 100% 98% 100%    Intake/Output Summary (Last 24 hours) at 12/15/15 1207 Last data filed at 12/15/15 1127  Gross per 24 hour  Intake   1665 ml  Output    725 ml  Net  940 ml   Filed Weights   12/13/15 0105  Weight: 78 kg (171 lb 15.3 oz)    Examination:  General exam: Looks sedated Respiratory system: Clear to auscultation. Respiratory effort normal. Cardiovascular system: S1 & S2 heard, RRR. No JVD, murmurs, rubs, gallops or clicks.  No pedal edema. Gastrointestinal system: Abdomen is nondistended, soft and nontender. No organomegaly or masses felt. Normal bowel sounds heard. Central nervous system: Alert and oriented. No focal neurological deficits. Extremities: surgical incision sites to right hip appeaqr clean no evidence of hematoma Skin: No rashes, lesions or ulcers Psychiatry: Judgement and insight appear normal. Mood & affect appropriate.     Data Reviewed: I have personally reviewed following labs and imaging studies  CBC:  Recent Labs Lab 12/12/15 2109 12/13/15 0507 12/14/15 0535 12/15/15 0447  WBC 14.3* 17.5* 13.6* 18.7*  NEUTROABS 10.7*  --   --   --   HGB 11.1* 10.5* 8.6* 7.8*  HCT 35.5* 34.7* 28.0* 25.7*  MCV 79.6 80.7 80.9 79.3  PLT 294 292 220 185   Basic Metabolic Panel:  Recent Labs Lab 12/12/15 2228 12/13/15 0507 12/14/15 0535 12/15/15 0447  NA 138 140 138 137  K 4.3 4.7 4.4 4.4  CL 104 105 104 106  CO2 GLUCOSE 154* 136* 119* 129*  BUN 21* CREATININE 1.35* 1.36* 1.55* 1.39*  CALCIUM 8.5* 8.2* 7.7* 7.6*  MG  --  1.8  --   --   PHOS  --  3.8  --   --    GFR: Estimated Creatinine Clearance: 24.9 mL/min (by C-G formula based on Cr of 1.39). Liver Function Tests:  Recent Labs Lab 12/13/15 0507  AST 20  ALT 11*  ALKPHOS 55  BILITOT 0.7  PROT 6.8  ALBUMIN 3.3*   No results for input(s): LIPASE, AMYLASE in the last 168 hours. No results for input(s): AMMONIA in the last 168 hours. Coagulation Profile:  Recent Labs Lab 12/12/15 2109  INR 0.91   Cardiac Enzymes: No results for input(s): CKTOTAL, CKMB, CKMBINDEX, TROPONINI in the last 168 hours. BNP (last 3 results) No results for input(s): PROBNP in the last 8760 hours. HbA1C: No results for input(s): HGBA1C in the last 72 hours. CBG: No results for input(s): GLUCAP in the last 168 hours. Lipid Profile: No results for input(s): CHOL, HDL, LDLCALC, TRIG, CHOLHDL, LDLDIRECT in the last 72  hours. Thyroid Function Tests:  Recent Labs  12/13/15 0507  TSH 0.887   Anemia Panel: No results for input(s): VITAMINB12, FOLATE, FERRITIN, TIBC, IRON, RETICCTPCT in the last 72 hours. Sepsis Labs: No results for input(s): PROCALCITON, LATICACIDVEN in the last 168 hours.  Recent Results (from the past 240 hour(s))  Surgical PCR screen     Status: None   Collection Time: 12/13/15  5:55 AM  Result Value Ref Range Status   MRSA, PCR NEGATIVE NEGATIVE Final   Staphylococcus aureus NEGATIVE NEGATIVE Final    Comment:        The Xpert SA Assay (FDA approved for NASAL specimens in patients over 16 years of age), is one component of a comprehensive surveillance program.  Test performance has been validated by [facility] for patients greater than or equal to 37 year old. It is not intended to diagnose infection nor to guide or monitor treatment.          Radiology Studies: Dg Femur [place], [place] 2 Views Right  12/13/2015  CLINICAL DATA:  Fracture repair.  EXAM: RIGHT FEMUR PORTABLE 1 VIEW COMPARISON:  December 12, 2015 FINDINGS: A gamma nail and rod have been placed across the right hip fracture. Hardware is in good position, affixed distally with a single screw. IMPRESSION: Fracture repair. Electronically Signed   By: Gerome Samavid  Williams III M.D   On: 12/13/2015 12:18        Scheduled Meds: . amiodarone  100 mg Oral Daily  . apixaban  2.5 mg Oral BID  . docusate sodium  100 mg Oral BID  . levothyroxine  75 mcg Oral QAC breakfast  . pantoprazole  40 mg Oral Daily  . senna  1 tablet Oral BID   Continuous Infusions: . sodium chloride 75 mL/hr at 12/15/15 0931     LOS: 3 days    Time spent: 30min    Jeralyn BennettZAMORA, Shivaun Bilello, MD [facility] Pager [phone]  If 7PM-7AM, please contact night-coverage [email or web address] Password [email or web address] 12/15/2015, 12:07 PM

## 2015-12-15 NOTE — Clinical Documentation Improvement (Signed)
Hospitalist  Can the diagnosis of CKD be further specified?   CKD Stage I - GFR greater than or equal to 90  CKD Stage II - GFR 60-89  CKD Stage III - GFR 30-59  CKD Stage IV - GFR 15-29  CKD Stage V - GFR < 15  ESRD (End Stage Renal Disease)  Other condition  Unable to clinically determine   Supporting Information: : (risk factors, signs and symptoms, diagnostics, treatment) Patient with a history of chronic kidney disease per 6/09 H&P. Labs:  Bun   Creat   GFR: 6/12:    20      1.39      32 6/11:    18      1.55      28  Please exercise your independent, professional judgment when responding. A specific answer is not anticipated or expected.   Thank Sabino DonovanYou, Lundy Cozart [facility] [employer] [place] [phone]

## 2015-12-15 NOTE — Progress Notes (Signed)
   Subjective:  Patient reports pain as moderate to severe.  Family states current pain med regimen not working and causes excessive sedation - they think that norco was working better.  Objective:   VITALS:   Filed Vitals:   12/14/15 0851 12/14/15 1106 12/14/15 2047 12/15/15 0510  BP: 103/40 112/56 132/51 130/46  Pulse:  78 97 90  Temp:  98.4 F (36.9 C) 98.2 F (36.8 C) 97.9 F (36.6 C)  TempSrc:  Oral Oral Oral  Resp:  18 18 18   Height:      Weight:      SpO2:  95% 93% 98%   NAD ABD soft Sensation intact distally Intact pulses distally Dorsiflexion/Plantar flexion intact Incision: dressing C/D/I Compartment soft   Lab Results  Component Value Date   WBC 18.7* 12/15/2015   HGB 7.8* 12/15/2015   HCT 25.7* 12/15/2015   MCV 79.3 12/15/2015   PLT 185 12/15/2015   BMET    Component Value Date/Time   NA 137 12/15/2015 0447   K 4.4 12/15/2015 0447   CL 106 12/15/2015 0447   CO2 24 12/15/2015 0447   GLUCOSE 129* 12/15/2015 0447   BUN 20 12/15/2015 0447   CREATININE 1.39* 12/15/2015 0447   CREATININE 1.14* 07/11/2014 1306   CALCIUM 7.6* 12/15/2015 0447   GFRNONAA 32* 12/15/2015 0447   GFRAA 37* 12/15/2015 0447     Assessment/Plan: 2 Days Post-Op   Active Problems:   Atrial fibrillation (HCC)   Leukocytosis   Hypothyroid   Closed right femoral fracture (HCC)   Femur fracture, right (HCC)   Fracture, intertrochanteric, right femur (HCC)   50% WB RLE with walker DVT ppx: apixaban x30 days ABLA: per hospitalist team Pain control: will d/c morphine and oxycodone and restart Norco PT/OT D/C planning   Norissa Bartee, Cloyde ReamsBrian James 12/15/2015, 6:45 AM   Samson FredericBrian Dominika Losey, MD Cell [phone]

## 2015-12-15 NOTE — Progress Notes (Signed)
OT Cancellation Note  Patient Details Name: Carla [name] MRN: [id number] DOB: 01/11/1922   Cancelled Treatment:    Reason Eval/Treat Not Completed: Other (comment) -- Patient currently total A +2 with mobility and d/c plan is SNF. Will defer OT needs to SNF. Thank you.  Joya Willmott A 12/15/2015, 7:08 AM

## 2015-12-15 NOTE — Care Management Note (Signed)
Case Management Note  Patient Details  Name: Carla [name] MRN: [id number] Date of Birth: 1921-12-17  Subjective/Objective: 82 y/o f admitted w/R hip fx. S/p im nail r hip. PT-SNF. CSW following for SNF.                   Action/Plan:dj/c SNF.   Expected Discharge Date:                  Expected Discharge Plan:  Skilled Nursing Facility  In-House Referral:  Clinical Social Work  Discharge planning Services  CM Consult  Post Acute Care Choice:    Choice offered to:     DME Arranged:    DME Agency:     HH Arranged:    HH Agency:     Status of Service:  In process, will continue to follow  Medicare Important Message Given:  Yes Date Medicare IM Given:    Medicare IM give by:    Date Additional Medicare IM Given:    Additional Medicare Important Message give by:     If discussed at Long Length of Stay Meetings, dates discussed:    Additional Comments:  Carla [name], Carla Whitehead, RN 12/15/2015, 3:45 PM

## 2015-12-15 NOTE — Care Management Important Message (Signed)
Important Message  Patient Details  Name: Carla [name] Bang MRN: [id number] Date of Birth: August 17, 1921   Medicare Important Message Given:  Yes    Haskell FlirtJamison, Kortlyn Koltz 12/15/2015, 10:12 AMImportant Message  Patient Details  Name: Carla [name] Renne MRN: [id number] Date of Birth: August 17, 1921   Medicare Important Message Given:  Yes    Haskell FlirtJamison, Angella Montas 12/15/2015, 10:12 AM

## 2015-12-16 DIAGNOSIS — I480 Paroxysmal atrial fibrillation: Secondary | ICD-10-CM

## 2015-12-16 LAB — BASIC METABOLIC PANEL
Anion gap: 3 — ABNORMAL LOW (ref 5–15)
BUN: 17 mg/dL (ref 6–20)
CALCIUM: 7.7 mg/dL — AB (ref 8.9–10.3)
CHLORIDE: 110 mmol/L (ref 101–111)
CO2: 26 mmol/L (ref 22–32)
CREATININE: 1.25 mg/dL — AB (ref 0.44–1.00)
GFR, EST AFRICAN AMERICAN: 42 mL/min — AB (ref >60)
GFR, EST NON AFRICAN AMERICAN: 36 mL/min — AB (ref >60)
Glucose, Bld: 126 mg/dL — ABNORMAL HIGH (ref 65–99)
Potassium: 4.4 mmol/L (ref 3.5–5.1)
SODIUM: 139 mmol/L (ref 135–145)

## 2015-12-16 LAB — URINALYSIS, ROUTINE W REFLEX MICROSCOPIC
BILIRUBIN URINE: NEGATIVE
Glucose, UA: NEGATIVE mg/dL
Ketones, ur: NEGATIVE mg/dL
NITRITE: POSITIVE — AB
PROTEIN: 30 mg/dL — AB
SPECIFIC GRAVITY, URINE: 1.017 (ref 1.005–1.030)
pH: 5.5 (ref 5.0–8.0)

## 2015-12-16 LAB — TYPE AND SCREEN
ABO/RH(D): O NEG
ANTIBODY SCREEN: NEGATIVE
UNIT DIVISION: 0

## 2015-12-16 LAB — URINE MICROSCOPIC-ADD ON

## 2015-12-16 LAB — CBC
HCT: 26.9 % — ABNORMAL LOW (ref 36.0–46.0)
HEMOGLOBIN: 8.3 g/dL — AB (ref 12.0–15.0)
MCH: 24.6 pg — AB (ref 26.0–34.0)
MCHC: 30.9 g/dL (ref 30.0–36.0)
MCV: 79.6 fL (ref 78.0–100.0)
PLATELETS: 200 10*3/uL (ref 150–400)
RBC: 3.38 MIL/uL — ABNORMAL LOW (ref 3.87–5.11)
RDW: 16.8 % — AB (ref 11.5–15.5)
WBC: 17.1 10*3/uL — ABNORMAL HIGH (ref 4.0–10.5)

## 2015-12-16 MED ORDER — DOCUSATE SODIUM 100 MG PO CAPS: 100.0000 mg | ORAL_CAPSULE | Freq: Two times a day (BID) | ORAL | Status: DC

## 2015-12-16 NOTE — Progress Notes (Signed)
   Subjective:  Patient reports pain as mild to moderate. No c/o.   Objective:   VITALS:   Filed Vitals:   12/15/15 1141 12/15/15 1340 12/15/15 2053 12/16/15 0500  BP: 145/49 148/44 136/50 111/41  Pulse: 97 95 94 80  Temp: 98.1 F (36.7 C) 98.7 F (37.1 C) 98.6 F (37 C) 98.3 F (36.8 C)  TempSrc: Oral Oral Oral Oral  Resp: 18 20 20 20   Height:      Weight:      SpO2: 100% 97% 97% 98%   NAD ABD soft Sensation intact distally Intact pulses distally Dorsiflexion/Plantar flexion intact Incision: dressing C/D/I Compartment soft   Lab Results  Component Value Date   WBC 17.1* 12/16/2015   HGB 8.3* 12/16/2015   HCT 26.9* 12/16/2015   MCV 79.6 12/16/2015   PLT 200 12/16/2015   BMET    Component Value Date/Time   NA 139 12/16/2015 0504   K 4.4 12/16/2015 0504   CL 110 12/16/2015 0504   CO2 26 12/16/2015 0504   GLUCOSE 126* 12/16/2015 0504   BUN 17 12/16/2015 0504   CREATININE 1.25* 12/16/2015 0504   CREATININE 1.14* 07/11/2014 1306   CALCIUM 7.7* 12/16/2015 0504   GFRNONAA 36* 12/16/2015 0504   GFRAA 42* 12/16/2015 0504     Assessment/Plan: 3 Days Post-Op   Active Problems:   Atrial fibrillation (HCC)   Leukocytosis   Hypothyroid   Closed right femoral fracture (HCC)   Femur fracture, right (HCC)   Fracture, intertrochanteric, right femur (HCC)   50% WB RLE with walker DVT ppx: apixaban x30 days ABLA: received 1 unit PRBCs yesterday per hospitalist team Pain control: Norco PT/OT SNF placement pending   Rumi Kolodziej, Cloyde ReamsBrian James 12/16/2015, 6:53 AM   Samson FredericBrian Dalynn Jhaveri, MD Cell [phone]

## 2015-12-16 NOTE — Progress Notes (Signed)
[facility] called for transport.     Mckena Chern, LCSW [facility] Clinical Social Worker cell #: [phone]  

## 2015-12-16 NOTE — NC FL2 (Signed)
[place] MEDICAID FL2 LEVEL OF CARE SCREENING TOOL     IDENTIFICATION  Patient Name: Carla [name] Birthdate: May 08, 1922 Sex: female Admission Date (Current Location): 12/12/2015  [facility] [place]   Facility and Address:  [facility],  [address], [place] [id number]      Provider Number: [id number]  Attending Physician Name and Address:  Jeralyn BennettEzequiel Zamora, MD  Relative Name and Phone Number:       Current Level of Care: Hospital Recommended Level of Care: Skilled Nursing Facility Prior Approval Number:    Date Approved/Denied:   PASRR Number: [id number] [id number]  Discharge Plan: Home    Current Diagnoses: Patient Active Problem List   Diagnosis Date Noted  . Fracture, intertrochanteric, right femur (HCC) 12/13/2015  . Closed right femoral fracture (HCC) 12/12/2015  . Femur fracture, right (HCC) 12/12/2015  . Pressure ulcer 08/16/2015  . Pulmonary embolism (HCC) 08/15/2015  . Elevated troponin level 08/15/2015  . Elevated brain natriuretic peptide (BNP) level 08/15/2015  . Hypothyroid 02/27/2015  . 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 Acid reflux 02/21/2014  . Adult hypothyroidism 04/23/2013  . Abnormal TSH 01/09/2013  . Intractable nausea and vomiting 01/08/2013  . Elevated lipase 01/08/2013  . Hyperglycemia 01/08/2013  . Arthritis 12/19/2012  . Presence of pessary 09/26/2012  . Incomplete bladder emptying 09/04/2012  . Urinary tract infection 08/23/2012  . Gram negative sepsis (HCC) 08/23/2012  . Intractable nausea and vomiting 08/19/2012  . Hypotension, unspecified 08/19/2012  . Dehydration 08/19/2012  . Atrial fibrillation (HCC) 08/19/2012  . Leukocytosis 08/19/2012  . Acute kidney injury (HCC) 08/19/2012  . Hypovolemic shock (HCC) 08/19/2012  . Iron deficiency anemia   . High cholesterol   . Osteoarthritis   . Female bladder prolapse   . Osteoporosis   . Hemorrhoids   . Diverticulosis   . Hiatal hernia   . Back  ache 01/19/2012  . Scoliosis 01/19/2012  . Absolute anemia 09/30/2011  . HLD (hyperlipidemia) 09/23/2011    Orientation RESPIRATION BLADDER Height & Weight     Self, Time, Situation, Place  Normal Incontinent, Indwelling catheter Weight: 171 lb 15.3 oz (78 kg) Height:  5\' 1"  (154.9 cm)  BEHAVIORAL SYMPTOMS/MOOD NEUROLOGICAL BOWEL NUTRITION STATUS      Continent Diet (Regular)  AMBULATORY STATUS COMMUNICATION OF NEEDS Skin   Extensive Assist Verbally Surgical wounds (Incision(Closed)06/10/17HipRight)                       Personal Care Assistance Level of Assistance  Bathing, Dressing Bathing Assistance: Limited assistance   Dressing Assistance: Limited assistance     Functional Limitations Info             SPECIAL CARE FACTORS FREQUENCY  PT (By licensed PT), OT (By licensed OT)     PT Frequency: 5 OT Frequency: 5            Contractures      Additional Factors Info  Code Status, Allergies Code Status Info: Fullcode Allergies Info: Allergies:  Bactrim, Ivp Dye, Nsaids           Current Medications (12/16/2015):  This is the current hospital active medication list Current Facility-Administered Medications  Medication Dose Route Frequency Provider Last Rate Last Dose  . 0.9 %  sodium chloride infusion   Intravenous Continuous Jeralyn BennettEzequiel Zamora, MD 75 mL/hr at 12/16/15 0317    . amiodarone (PACERONE) tablet 100 mg  100 mg Oral Daily Therisa DoyneAnastassia Doutova, MD   100 mg  at 12/15/15 0919  . apixaban (ELIQUIS) tablet 2.5 mg  2.5 mg Oral BID Samson Frederic, MD   2.5 mg at 12/15/15 2049  . docusate sodium (COLACE) capsule 100 mg  100 mg Oral BID Samson Frederic, MD   100 mg at 12/15/15 2049  . HYDROcodone-acetaminophen (NORCO/VICODIN) 5-325 MG per tablet 1 tablet  1 tablet Oral Q4H PRN Jeralyn Bennett, MD   1 tablet at 12/16/15 0200  . levothyroxine (SYNTHROID, LEVOTHROID) tablet 75 mcg  75 mcg Oral QAC breakfast Therisa Doyne, MD   75 mcg at 12/16/15 0755  .  magnesium citrate solution 1 Bottle  1 Bottle Oral Once PRN Samson Frederic, MD      . menthol-cetylpyridinium (CEPACOL) lozenge 3 mg  1 lozenge Oral PRN Samson Frederic, MD       Or  . phenol (CHLORASEPTIC) mouth spray 1 spray  1 spray Mouth/Throat PRN Samson Frederic, MD      . methocarbamol (ROBAXIN) tablet 500 mg  500 mg Oral Q6H PRN Samson Frederic, MD   500 mg at 12/16/15 0200   Or  . methocarbamol (ROBAXIN) 500 mg in dextrose 5 % 50 mL IVPB  500 mg Intravenous Q6H PRN Samson Frederic, MD      . metoCLOPramide (REGLAN) tablet 5-10 mg  5-10 mg Oral Q8H PRN Samson Frederic, MD       Or  . metoCLOPramide (REGLAN) injection 5-10 mg  5-10 mg Intravenous Q8H PRN Samson Frederic, MD      . ondansetron (ZOFRAN) tablet 4 mg  4 mg Oral Q6H PRN Samson Frederic, MD       Or  . ondansetron (ZOFRAN) injection 4 mg  4 mg Intravenous Q6H PRN Samson Frederic, MD      . pantoprazole (PROTONIX) EC tablet 40 mg  40 mg Oral Daily Rolan Lipa, NP   40 mg at 12/15/15 0924  . polyethylene glycol (MIRALAX / GLYCOLAX) packet 17 g  17 g Oral Daily PRN Samson Frederic, MD      . senna (SENOKOT) tablet 8.6 mg  1 tablet Oral BID Samson Frederic, MD   8.6 mg at 12/15/15 2048  . sorbitol 70 % solution 30 mL  30 mL Oral Daily PRN Samson Frederic, MD         Discharge Medications: Please see discharge summary for a list of discharge medications.  Relevant Imaging Results:  Relevant Lab Results:   Additional Information SSN: [id number]  Arlyss Repress, LCSW

## 2015-12-16 NOTE — Progress Notes (Signed)
PT came to work with pt. And asked for pain medicine. Pain medicine given at [phone]. Once pt. Was in chair, MD was concerned about oversedation due to her pain medicines. Pt's daughter, Corrie DandyMary, was not present. [facility] explained to [facility] about [facility] concerns with pain medicines. She insists that her mother needs the pain medicine d/t her expressions of pain. Will continue to monitor pt. Closely for oversedation. Will try to wean off of pain meds and oxygen today. RN encouraging use of the incentive spirometer.

## 2015-12-16 NOTE — Care Management Note (Signed)
Case Management Note  Patient Details  Name: Carla [name] MRN: [id number] Date of Birth: 1921/12/19  Subjective/Objective:                    Action/Plan:d/c SNF.   Expected Discharge Date:                  Expected Discharge Plan:  Skilled Nursing Facility  In-House Referral:  Clinical Social Work  Discharge planning Services  CM Consult  Post Acute Care Choice:    Choice offered to:     DME Arranged:    DME Agency:     HH Arranged:    HH Agency:     Status of Service:  Completed, signed off  Medicare Important Message Given:  Yes Date Medicare IM Given:    Medicare IM give by:    Date Additional Medicare IM Given:    Additional Medicare Important Message give by:     If discussed at Long Length of Stay Meetings, dates discussed:    Additional Comments:  Lanier ClamMahabir, Raylene Carmickle, RN 12/16/2015, 1:02 PM

## 2015-12-16 NOTE — Progress Notes (Signed)
Physical Therapy Treatment Patient Details Name: Carla [name] MRN: [id number] DOB: 29-Nov-1921 Today's Date: 12/16/2015    History of Present Illness 82 yo female s/p IM nail R IT femur 12/13/15, A fib.  Hx of PE, R LE DVT, IVC filter, A fib, osteoporosis, scolisos.     PT Comments    POD # 2  Assisted PT OOB to recliner + 2 total Assist.  Pt very sleepy/groggy.  Pt was unable to support herself in standing enough to attempt amb.  Instructed pt on PWB.  Recd nursing use lift to assist back to bed.    Follow Up Recommendations  SNF     Equipment Recommendations       Recommendations for Other Services       Precautions / Restrictions Precautions Precautions: Fall Precaution Comments: Pre Medicate for pain Restrictions Weight Bearing Restrictions: Yes RLE Weight Bearing: Partial weight bearing RLE Partial Weight Bearing Percentage or Pounds: 50%    Mobility  Bed Mobility Overal bed mobility: Needs Assistance;+2 for physical assistance Bed Mobility: Supine to Sit     Supine to sit: Total assist;+2 for physical assistance;+2 for safety/equipment;HOB elevated (pt 5%)     General bed mobility comments: once EOB tolerated sitting x 3 min at [employer] Assist.   Sleepy/groggy (meds?)  Transfers Overall transfer level: Needs assistance Equipment used: Rolling walker (2 wheeled) Transfers: Pharmacologisttand Pivot Transfers;Sit to/from Stand Sit to Stand: Max assist;+2 physical assistance Stand pivot transfers: Max assist;+2 physical assistance       General transfer comment: Pt 15% with difficulty from elevated bed to recliner.  RA decreased to 87% and HR increased to 124  Ambulation/Gait                 Stairs            Wheelchair Mobility    Modified Rankin (Stroke Patients Only)       Balance                                    Cognition Arousal/Alertness: Awake/alert Behavior During Therapy: WFL for tasks assessed/performed Overall  Cognitive Status: Within Functional Limits for tasks assessed                      Exercises      General Comments        Pertinent Vitals/Pain Pain Assessment: 0-10 Pain Score: 8  Pain Location: R thigh Pain Descriptors / Indicators: Grimacing;Crying Pain Intervention(s): Monitored during session;Premedicated before session;Repositioned;Ice applied    Home Living                      Prior Function            PT Goals (current goals can now be found in the care plan section) Progress towards PT goals: Progressing toward goals    Frequency  Min 3X/week    PT Plan Current plan remains appropriate    Co-evaluation             End of Session Equipment Utilized During Treatment: Oxygen Activity Tolerance: Patient limited by pain Patient left: in chair;with call bell/phone within reach     Time: 1031-1058 PT Time Calculation (min) (ACUTE ONLY): 27 min  Charges:  $Gait Training: 8-22 mins $Therapeutic Activity: 8-22 mins  G Codes:      Rica Koyanagi  PTA [facility]  Acute  Rehab Pager      [phone]

## 2015-12-16 NOTE — Discharge Summary (Signed)
Physician Discharge Summary  ZOANNE NEWILL ZOX:[id number] DOB: Jun 22, 1922 DOA: 12/12/2015  PCP: [name], MD  Admit date: 12/12/2015 Discharge date: 12/16/2015  Time spent: 35 minutes  Recommendations for Outpatient Follow-up:  1. Please follow up on CBC and BMP in 4-5 days 2. During this hospitalization there were issues with oversedation with narcotic analgesics. Please monitor closely for oversedation.      Discharge Diagnoses:  Active Problems:   Atrial fibrillation (HCC)   Leukocytosis   Hypothyroid   Closed right femoral fracture (HCC)   Femur fracture, right (HCC)   Fracture, intertrochanteric, right femur (HCC)   Discharge Condition: Stable  Diet recommendation: Regular Diet  Filed Weights   12/13/15 0105  Weight: 78 kg (171 lb 15.3 oz)    History of present illness:  RIFKY LAPRE is a 82 y.o. female with medical history significant of A. fib developed the setting of urosepsis treated amiodarone, Pulmonary hypertension, history of upper GI bleeding, hypothyroidism, right leg DVT, PE in February 2017, status post IVC filter History of recto-vaginal and vesico-vaginal fistulas followed by [facility], recurrent UTIs secondary to fistula  Presented with mechanical Fall today. She was bending over to pick up newspaper and lost balance. Resulting in right hip shortening and rotation Patient was not able to stand up and 911 was called. Patient's pain has been constant. She was brought in to emergency department Patient still endorses a baseline having some mild shortness of breath with activity she is able to get around the house and to the mail box without trouble. no chest pain at rest or with activity.  She is currently not on anticoagulation. Denies head injury or loss of consciousness  Regarding pertinent Chronic problems: Patient had in the past complicated medical history developed an atrial flutter ablation in the setting of urosepsis was treated with  amiodarone and subsequently converted to normal sinus rhythm. She developed right leg DVT and PE in [place] 2017 and had IVC filter placed. Patient has history of large hiatal hernia with gastritis at high risk of bleeding. Subsequently had Coumadin has been discontinued after 3 months of treatment. Answer P patient have had chronic dyspnea on exertion. He has known left upper lobe lung nodule will plan to repeat CT chest in [facility] Course:  Mrs Macconnell is a 82 year old female with a complex past medical history including atrial fibrillation, history of urosepsis, history of upper GI bleeding, hypothyroidism, pulmonary embolism status post IVC filter placement, admitted to medicine service on 12/13/2015 which presented after having a fall at home. She reportedly bent over to pick up newspaper lost balance and fell on her right hip. Was unable to bear weight afterwards which she was brought to the emergency room. Imaging studies revealed an acute intertrochanteric right femoral fracture.  1. Intertrochanteric right femur fracture -Patient is a pleasant 82 year old with multiple medical problems, having a fall at home landing on her right side and injuring her right hip. -Imaging studies performed in the emergency department revealing an intertrochanteric femoral fracture of right hip. -Orthopedic surgery was consulted she was taken to the operating room on 12/13/2015 undergoing intramedullary nail placement. Procedure performed by Dr. Linna Caprice orthopedic surgery. -Physical therapy consultation, will likely require skilled nursing facility placement. Social work consult for disposition. -She was discharged on Norco for pain control  2. Atrial fibrillation -CHADSVasc score of 4 -Having history of GI bleed she had not been chronically anticoagulated prior to this hospitalization -Ventricular rates in the 70s  to 80s on telemetry. -Continue amiodarone 100 mg by mouth daily  3.  Leukocytosis. -Lab work showing a white count of 17,500, suspect related to hip fracture. -Otherwise she is afebrile and hemodynamically stable -She remains afebrile  4. History of bilateral pulmonary emboli -Had been anticoagulated with warfarin which was discontinued after 3 months of therapy by her hematologist at [facility] -Now on eliquis for DVT prophylaxis  5. Hypothyroidism.  -Continue thyroid replacement therapy  6. Acute renal failure -Improved with IV fluid administration  7. Post Operative Anemia -She was transfused with 1 unit of PRBC's  -Labs showing Hg of 8.3 on 12/16/2015  Procedures: INTRAMEDULLARY (IM) NAIL INTERTROCHANTRIC (Right)  Consultations:  Ortho  Discharge Exam: Filed Vitals:   12/15/15 2053 12/16/15 0500  BP: 136/50 111/41  Pulse: 94 80  Temp: 98.6 F (37 C) 98.3 F (36.8 C)  Resp: 20 20    General exam: Sitting at bedside, she is awake and alert.  Respiratory system: Clear to auscultation. Respiratory effort normal. Cardiovascular system: S1 & S2 heard, RRR. No JVD, murmurs, rubs, gallops or clicks. No pedal edema. Gastrointestinal system: Abdomen is nondistended, soft and nontender. No organomegaly or masses felt. Normal bowel sounds heard. Central nervous system: Alert and oriented. No focal neurological deficits. Extremities: surgical incision sites to right hip appeaqr clean no evidence of hematoma Skin: No rashes, lesions or ulcers Psychiatry: Judgement and insight appear normal. Mood & affect appropriate.   Discharge Instructions   Discharge Instructions    Call MD for:  difficulty breathing, headache or visual disturbances    Complete by:  As directed      Call MD for:  extreme fatigue    Complete by:  As directed      Call MD for:  hives    Complete by:  As directed      Call MD for:  persistant dizziness or light-headedness    Complete by:  As directed      Call MD for:  persistant nausea and vomiting    Complete by:   As directed      Call MD for:  redness, tenderness, or signs of infection (pain, swelling, redness, odor or green/yellow discharge around incision site)    Complete by:  As directed      Call MD for:  severe uncontrolled pain    Complete by:  As directed      Call MD for:  temperature >100.4    Complete by:  As directed      Call MD for:    Complete by:  As directed      Diet - low sodium heart healthy    Complete by:  As directed      Increase activity slowly    Complete by:  As directed      Partial weight bearing    Complete by:  As directed   % Body Weight:  50  Laterality:  right  Extremity:  Lower          Current Discharge Medication List    START taking these medications   Details  apixaban (ELIQUIS) 2.5 MG TABS tablet Take 1 tablet (2.5 mg total) by mouth 2 (two) times daily. Qty: 30 tablet, Refills: 0    !! docusate sodium (COLACE) 100 MG capsule Take 1 capsule (100 mg total) by mouth 2 (two) times daily. Qty: 10 capsule, Refills: 0    HYDROcodone-acetaminophen (NORCO/VICODIN) 5-325 MG tablet Take 1-2 tablets by mouth every 6 (  six) hours as needed for moderate pain. Qty: 60 tablet, Refills: 0     !! - Potential duplicate medications found. Please discuss with provider.    CONTINUE these medications which have NOT CHANGED   Details  amiodarone (PACERONE) 200 MG tablet TAKE 1/2 TABLET BY MOUTH DAILY Qty: 45 tablet, Refills: 2    Calcium Carb-Cholecalciferol (CALCIUM-VITAMIN D) 600-400 MG-UNIT TABS Take 1 tablet by mouth 2 (two) times daily.     !! docusate sodium (COLACE) 100 MG capsule Take 100 mg by mouth daily as needed (constipation).     levothyroxine (SYNTHROID, LEVOTHROID) 75 MCG tablet Take 75 mcg by mouth daily before breakfast.    nitrofurantoin (MACRODANTIN) 50 MG capsule Take 50 mg by mouth daily.     pantoprazole (PROTONIX) 40 MG tablet Take 40 mg by mouth daily.     !! - Potential duplicate medications found. Please discuss with provider.     STOP taking these medications     acetaminophen (TYLENOL) 650 MG CR tablet      warfarin (COUMADIN) 1 MG tablet        Allergies  Allergen Reactions  . Bactrim [Sulfamethoxazole-Trimethoprim] Other (See Comments)    Has chronic kidney disease.  This is not recommended by nephrologist.    . Ardine Bjork [Iodinated Diagnostic Agents]   . Nsaids Other (See Comments)    Has chronic kidney disease.  This is not recommended by nephrologist.   Follow-up Information    Follow up with Swinteck, Cloyde Reams, MD In 2 weeks.   Specialty:  Orthopedic Surgery   Why:  For wound re-check   Contact information:   [address] [place] [address] [phone]       Follow up with [name], MD In 1 week.   Specialty:  Family Medicine   Contact information:   [address] [place] [address] [phone]        The results of significant diagnostics from this hospitalization (including imaging, microbiology, ancillary and laboratory) are listed below for reference.    Significant Diagnostic Studies: Dg Chest 1 View  12/12/2015  CLINICAL DATA:  Larey Seat, hip pain EXAM: CHEST 1 VIEW COMPARISON:  08/15/2015 FINDINGS: Large hiatal hernia. Heart size normal. Elevation of the right diaphragm. Right greater than left shoulder arthropathy. Lungs clear. No effusion. IMPRESSION: Numerous chronic findings with no acute abnormality. Electronically Signed   By: Esperanza Heir M.D.   On: 12/12/2015 21:57   Dg Pelvis 1-2 Views  12/12/2015  CLINICAL DATA:  Fall, right hip pain EXAM: PELVIS - 1-2 VIEW COMPARISON:  None. FINDINGS: Intertrochanteric right femur fracture as described in report of right femur. Pelvic bones intact. IMPRESSION: Right femur fracture Electronically Signed   By: Esperanza Heir M.D.   On: 12/12/2015 21:58   Ct Chest Wo Contrast  12/03/2015  CLINICAL DATA:  Eval ground glass opacities in Lt lung. No contrast due to elevated kidney function EXAM: CT CHEST  WITHOUT CONTRAST TECHNIQUE: Multidetector CT imaging of the chest was performed following the standard protocol without IV contrast. COMPARISON:  08/15/2015 FINDINGS: Neck base and axilla:  No mass or adenopathy. Mediastinum and hila: Heart is normal in size and configuration. There are moderate coronary artery calcifications. Calcified atherosclerotic plaque is noted along the thoracic aorta and proximal innominate artery and left subclavian artery. There is a moderate size hiatal hernia. No mediastinal or hilar masses or enlarged lymph nodes. Lungs and pleura: Small area of irregular opacity with associated mild  bronchiectasis in the left upper lobe at the apex, centered on image 14, series 3, measuring approximately 11 x 7 mm, is stable from the most recent prior study. There is medial lower lobe opacity abutting the medial pleura, which is stable consistent with atelectasis. Some minor reticular scarring or atelectasis is noted in both anterior lung bases. Subtle area of ground-glass opacity in the left upper lobe, image 56, measuring 11 x 8 mm is smaller and less apparent than on the prior CT. Stable calcified granuloma in the lateral left lung base. No pleural effusion.  No pneumothorax. Limited upper abdomen: Low-density left upper pole renal mass consistent with a cyst stable from an abdomen pelvis CT dated 01/08/2013. Status postcholecystectomy. No acute findings. Musculoskeletal: Bones are demineralized. There are degenerative disc changes along the thoracic spine. No osteoblastic or osteolytic lesions. IMPRESSION: 1. There are 2 areas of residual focal opacity in the left upper lobe, 1 near the apex, which is most likely due to scarring, and the other in the anterior left upper lobe more inferiorly, which has mildly decreased in size and conspicuity since the prior CT. No new lung abnormalities. Recommend additional followup CT, without contrast, in 9 months to document longer term stability.  Electronically Signed   By: Amie Portland M.D.   On: 12/03/2015 14:46   Pelvis Portable  12/13/2015  CLINICAL DATA:  Status post ORIF today. EXAM: PORTABLE PELVIS 1-2 VIEWS COMPARISON:  One day prior FINDINGS: Placement of an intra medullary rod with proximal fixation screw across the previously described intratrochanteric femur fracture. Distal portion of the intramedullary rod is not imaged. No acute hardware complication. Osteopenia. Vascular calcifications. IMPRESSION: Interval fixation of previously described right femur fracture. Electronically Signed   By: Jeronimo Greaves M.D.   On: 12/13/2015 10:02   Dg C-arm 1-60 Min-no Report  12/13/2015  CLINICAL DATA: IM Nail right hip C-ARM 1-60 MINUTES Fluoroscopy was utilized by the requesting physician.  No radiographic interpretation.   Dg Hip Operative Unilat W Or W/o Pelvis Right  12/13/2015  CLINICAL DATA:  82 year old female with a history of hip fracture EXAM: OPERATIVE RIGHT HIP WITH PELVIS; DG C-ARM 1-60 MIN-NO REPORT COMPARISON:  12/12/2015 FINDINGS: Multiple intraoperative fluoroscopic spot images during open reduction internal fixation of right hip fracture demonstrates placement of antegrade right femoral intra medullary rod with gamma nail fixation of the femoral neck. No immediate IMPRESSION: Intraoperative fluoroscopic spot images during open reduction internal fixation of right femoral neck fracture, with antegrade intra medullary rod placement and gamma nail fixation. Please refer to the dictated operative report for full details of intraoperative findings and procedure. Signed, Yvone Neu. Loreta Ave, DO [facility] Electronically Signed   By: Gilmer Mor D.O.   On: 12/13/2015 09:58   Dg Femur, Min 2 Views Right  12/12/2015  CLINICAL DATA:  Fall, femur pain EXAM: RIGHT FEMUR 2 VIEWS COMPARISON:  None. FINDINGS: Mildly impacted intertrochanteric right femur fracture with mild varus  angulation. Extensive femoral artery calcification. IMPRESSION: Acute femur fracture Electronically Signed   By: Esperanza Heir M.D.   On: 12/12/2015 21:58   Dg Femur Port, Min 2 Views Right  12/13/2015  CLINICAL DATA:  Fracture repair. EXAM: RIGHT FEMUR PORTABLE 1 VIEW COMPARISON:  December 12, 2015 FINDINGS: A gamma nail and rod have been placed across the right hip fracture. Hardware is in good position, affixed distally with a single screw. IMPRESSION: Fracture repair. Electronically Signed   By: Gerome Sam III  M.D   On: 12/13/2015 12:18    Microbiology: Recent Results (from the past 240 hour(s))  Surgical PCR screen     Status: None   Collection Time: 12/13/15  5:55 AM  Result Value Ref Range Status   MRSA, PCR NEGATIVE NEGATIVE Final   Staphylococcus aureus NEGATIVE NEGATIVE Final    Comment:        The Xpert SA Assay (FDA approved for NASAL specimens in patients over 80 years of age), is one component of a comprehensive surveillance program.  Test performance has been validated by [facility] for patients greater than or equal to 80 year old. It is not intended to diagnose infection nor to guide or monitor treatment.      Labs: Basic Metabolic Panel:  Recent Labs Lab 12/12/15 2228 12/13/15 0507 12/14/15 0535 12/15/15 0447 12/16/15 0504  NA 138 140 138 137 139  K 4.3 4.7 4.4 4.4 4.4  CL 104 105 104 106 110  CO2 28 28 28 24 26   GLUCOSE 154* 136* 119* 129* 126*  BUN 21* 18 18 20 17   CREATININE 1.35* 1.36* 1.55* 1.39* 1.25*  CALCIUM 8.5* 8.2* 7.7* 7.6* 7.7*  MG  --  1.8  --   --   --   PHOS  --  3.8  --   --   --    Liver Function Tests:  Recent Labs Lab 12/13/15 0507  AST 20  ALT 11*  ALKPHOS 55  BILITOT 0.7  PROT 6.8  ALBUMIN 3.3*   No results for input(s): LIPASE, AMYLASE in the last 168 hours. No results for input(s): AMMONIA in the last 168 hours. CBC:  Recent Labs Lab 12/12/15 2109 12/13/15 0507 12/14/15 0535 12/15/15 0447 12/16/15 0504   WBC 14.3* 17.5* 13.6* 18.7* 17.1*  NEUTROABS 10.7*  --   --   --   --   HGB 11.1* 10.5* 8.6* 7.8* 8.3*  HCT 35.5* 34.7* 28.0* 25.7* 26.9*  MCV 79.6 80.7 80.9 79.3 79.6  PLT 294 292 220 185 200   Cardiac Enzymes: No results for input(s): CKTOTAL, CKMB, CKMBINDEX, TROPONINI in the last 168 hours. BNP: BNP (last 3 results)  Recent Labs  08/15/15 1545  BNP 295.4*    ProBNP (last 3 results) No results for input(s): PROBNP in the last 8760 hours.  CBG: No results for input(s): GLUCAP in the last 168 hours.     Signed:  Jeralyn BennettZAMORA, Kaylib Furness MD.  [facility] 12/16/2015, 10:51 AM

## 2015-12-16 NOTE — Clinical Social Work Placement (Signed)
Patient is set to [facility] today. Patient & daughter, Carla [name] at bedside made aware. Discharge packet given to RN, Carla [name]. [facility] will be called for transport once admission paperwork has been completed with Carla [name] from [facility] & daughter.     [name], LCSW [facility] Clinical Social Worker cell #: [phone]    CLINICAL SOCIAL WORK PLACEMENT  NOTE  Date:  12/16/2015  Patient Details  Name: Carla [name] MRN: [id number] Date of Birth: 1921-08-10  Clinical Social Work is seeking post-discharge placement for this patient at the Skilled  Nursing Facility level of care (*CSW will initial, date and re-position this form in  chart as items are completed):  Yes   Patient/family provided with [facility] Clinical Social Work Department's list of facilities offering this level of care within the geographic area requested by the patient (or if unable, by the patient's family).  Yes   Patient/family informed of their freedom to choose among providers that offer the needed level of care, that participate in Medicare, Medicaid or managed care program needed by the patient, have an available bed and are willing to accept the patient.  Yes   Patient/family informed of [facility]'s ownership interest in [facility] and [facility], as well as of the fact that they are under no obligation to receive care at these facilities.  PASRR submitted to EDS on 12/16/15     PASRR number received on 12/16/15     Existing PASRR number confirmed on       FL2 transmitted to all facilities in geographic area requested by pt/family on 12/16/15     FL2 transmitted to all facilities within larger geographic area on       Patient informed that his/her managed care company has contracts with or will negotiate with certain facilities, including the following:        Yes   Patient/family informed of bed offers received.  [facility]      Physician recommends and patient chooses bed at      [facility] 12[facility].  Patient to be transferred to facility by [facility]     Patient family notified on 12/16/15 of transfer.  Name of family member notified:  patient's daughter, Carla [name] at bedside     PHYSICIAN       Additional Comment:    _______________________________________________ Arlyss RepressHarrison, [name], LCSW 12/16/2015, 11:59 AM

## 2015-12-16 NOTE — Plan of Care (Addendum)
Problem: Activity: Goal: Ability to ambulate and perform ADLs will improve Outcome: Not Progressing Pt continuing to have a lot of pain with ambulation

## 2015-12-16 NOTE — Progress Notes (Signed)
Pt. And daughter given d/c instructions. AVS forms included with paperwork for SNF. Pt. Telemetry and IV d/c. Pt. Given pain medicine at 1432. Ice applied to Right hip and pt. Changed into paper gown. Pt. Voided before d/c. [facility] picked up pt at 1520.

## 2015-12-17 ENCOUNTER — Encounter: Payer: Self-pay | Admitting: Internal Medicine

## 2015-12-17 ENCOUNTER — Non-Acute Institutional Stay (SKILLED_NURSING_FACILITY): Payer: Medicare Other | Admitting: Internal Medicine

## 2015-12-17 DIAGNOSIS — M810 Age-related osteoporosis without current pathological fracture: Secondary | ICD-10-CM | POA: Diagnosis not present

## 2015-12-17 DIAGNOSIS — D72829 Elevated white blood cell count, unspecified: Secondary | ICD-10-CM | POA: Diagnosis not present

## 2015-12-17 DIAGNOSIS — K449 Diaphragmatic hernia without obstruction or gangrene: Secondary | ICD-10-CM | POA: Diagnosis not present

## 2015-12-17 DIAGNOSIS — N179 Acute kidney failure, unspecified: Secondary | ICD-10-CM

## 2015-12-17 DIAGNOSIS — R531 Weakness: Secondary | ICD-10-CM

## 2015-12-17 DIAGNOSIS — N3090 Cystitis, unspecified without hematuria: Secondary | ICD-10-CM

## 2015-12-17 DIAGNOSIS — N3080 Other cystitis without hematuria: Secondary | ICD-10-CM | POA: Diagnosis not present

## 2015-12-17 DIAGNOSIS — M79604 Pain in right leg: Secondary | ICD-10-CM

## 2015-12-17 DIAGNOSIS — I480 Paroxysmal atrial fibrillation: Secondary | ICD-10-CM

## 2015-12-17 DIAGNOSIS — K5900 Constipation, unspecified: Secondary | ICD-10-CM

## 2015-12-17 DIAGNOSIS — S72101S Unspecified trochanteric fracture of right femur, sequela: Secondary | ICD-10-CM | POA: Diagnosis not present

## 2015-12-17 DIAGNOSIS — D62 Acute posthemorrhagic anemia: Secondary | ICD-10-CM

## 2015-12-17 DIAGNOSIS — E038 Other specified hypothyroidism: Secondary | ICD-10-CM

## 2015-12-17 NOTE — Progress Notes (Signed)
LOCATION: [facility]  PCP: [name], MD   Code Status: Full Code  Goals of care: Advanced Directive information Advanced Directives 12/13/2015  Does patient have an advance directive? Yes  Type of Estate agent of [place];Living will  Does patient want to make changes to advanced directive? No - Patient declined  Copy of advanced directive(s) in chart? No - copy requested       Extended Emergency Contact Information Primary Emergency Contact: Gilliam,Mary Address: [address]          [place], [place] [address] Darden Amber of [place] Home Phone: [phone] Mobile Phone: [phone] Relation: Daughter Secondary Emergency Contact: Rodney Booze Address: [address]          [place], [place] [address] Darden Amber of [place] Home Phone: [phone] Mobile Phone: [phone] Relation: Relative  Allergies reviewed  Chief Complaint  Patient presents with  . New Admit To SNF    New Admission     HPI:  Patient is a 82 y.o. female seen today for short term rehabilitation post hospital admission from 12/12/15-12/16/15 post fall with right intertrochanteric femur fracture. She underwent IM nailing on 12/13/15. She had blood loss anemia and received 1 u prbc transfusion. She had acute renal failure and received iv fluids. She is seen in her room today. She complaints of pain to her right leg. She was not using oxygen at home.   Review of Systems:  Constitutional: Negative for fever, chills, diaphoresis. Energy level is slowly coming back.  HENT: Negative for headache, congestion, nasal discharge, hearing loss, sore throat, difficulty swallowing.   Eyes: Negative for blurred vision, double vision and discharge. Wears glasses.  Respiratory: Negative for cough, shortness of breath and wheezing.  On oxygen.  Cardiovascular: Negative for chest pain, palpitations, leg swelling.  Gastrointestinal: Negative for heartburn, nausea, vomiting,  abdominal pain, loss of appetite. Last bowel movement was this morning.  Genitourinary: Negative for dysuria and flank pain.  Musculoskeletal: Negative for back pain, fall in the facility.  Skin: Negative for itching, rash.  Neurological: Negative for dizziness. Psychiatric/Behavioral: Negative for depression   Past Medical History  Diagnosis Date  . Iron deficiency anemia   . High cholesterol   . Osteoarthritis   . Female bladder prolapse   . Osteoporosis   . Cataracts, bilateral   . Gallstones   . Hemorrhoids   . Diverticulosis   . Hiatal hernia   . Atrial fibrillation (HCC)   . Cystocele   . Rectocele   . Scoliosis   . Lordosis   . Hyperlipidemia   . Osteoarthritis   . Dysrhythmia     A- fib  . Pneumonia     hx aspiration pneumonia  . Hypothyroidism   . Chronic kidney disease     chronic kidney disease  . Encounter for fitting and adjustment of pessary 04/23/2002  . Renal insufficiency    Past Surgical History  Procedure Laterality Date  . Cholecystectomy    . Cataract extraction    . Esophagogastroduodenoscopy    . Colonoscopy    . Endometrial biopsy  09/2009  . Esophagogastroduodenoscopy N/A 01/09/2013    Procedure: ESOPHAGOGASTRODUODENOSCOPY (EGD);  Surgeon: Vertell Novak., MD;  Location: [facility];  Service: Endoscopy;  Laterality: N/A;  . Rectal ultrasound N/A 04/10/2014    Procedure: RECTAL ULTRASOUND;  Surgeon: Romie Levee, MD;  Location: [facility];  Service: Endoscopy;  Laterality: N/A;  . Intramedullary (im) nail intertrochanteric Right 12/13/2015    Procedure: INTRAMEDULLARY (  IM) NAIL INTERTROCHANTRIC;  Surgeon: Samson Frederic, MD;  Location: [facility];  Service: Orthopedics;  Laterality: Right;   Social History:   reports that she has never smoked. She has never used smokeless tobacco. She reports that she does not drink alcohol or use illicit drugs.  Family History  Problem Relation Age of Onset  . Breast cancer Paternal Aunt   . Breast  cancer Paternal Aunt   . Cancer      several family members on dad's side of family  . Diabetes Father   . Leukemia Father   . Heart failure Brother     Pt cannot confirm this history    Medications:   Medication List       This list is accurate as of: 12/17/15 12:33 PM.  Always use your most recent med list.               amiodarone 100 MG tablet  Commonly known as:  PACERONE  Take 100 mg by mouth daily.     apixaban 2.5 MG Tabs tablet  Commonly known as:  ELIQUIS  Take 1 tablet (2.5 mg total) by mouth 2 (two) times daily.     Calcium-Vitamin D 600-400 MG-UNIT Tabs  Take 1 tablet by mouth 2 (two) times daily.     docusate sodium 100 MG capsule  Commonly known as:  COLACE  Take 1 capsule (100 mg total) by mouth 2 (two) times daily.     docusate sodium 100 MG capsule  Commonly known as:  COLACE  Take 100 mg by mouth daily.     HYDROcodone-acetaminophen 5-325 MG tablet  Commonly known as:  NORCO/VICODIN  Take 1 tablet by mouth every 6 (six) hours as needed for moderate pain.     levothyroxine 75 MCG tablet  Commonly known as:  SYNTHROID, LEVOTHROID  Take 75 mcg by mouth daily before breakfast.     nitrofurantoin 50 MG capsule  Commonly known as:  MACRODANTIN  Take 50 mg by mouth daily.     pantoprazole 40 MG tablet  Commonly known as:  PROTONIX  Take 40 mg by mouth daily.        Immunizations: Immunization History  Administered Date(s) Administered  . DTaP 05/05/2006  . Influenza-Unspecified 02/02/2013  . PPD Test 12/16/2015  . Zoster 05/05/2006     Physical Exam: Filed Vitals:   12/17/15 1226  BP: 144/63  Pulse: 78  Temp: 97.1 F (36.2 C)  TempSrc: Oral  Resp: 18  Height: 5\' 1"  (1.549 m)  Weight: 171 lb (77.565 kg)  SpO2: 92%   Body mass index is 32.33 kg/(m^2).  General- elderly female, obese, in no acute distress Head- normocephalic, atraumatic Nose- no maxillary or frontal sinus tenderness, no nasal discharge Throat- moist mucus  membrane  Eyes- PERRLA, EOMI, no pallor, no icterus, no discharge, normal conjunctiva, normal sclera Neck- no cervical lymphadenopathy Cardiovascular- normal s1,s2, no murmur, trace leg edema Respiratory- bilateral clear to auscultation, no wheeze, no rhonchi, no crackles, no use of accessory muscles Abdomen- bowel sounds present, soft, non tender Musculoskeletal- able to move all 4 extremities, limited right leg range of motion Neurological- alert and oriented to person, place and time Skin- warm and dry, right hip surgical incision with aquacel dressing in place Psychiatry- normal mood and affect    Labs reviewed: Basic Metabolic Panel:  Recent Labs  16/10/96 0007  08/18/15 0231  12/13/15 0507 12/14/15 0535 12/15/15 0447 12/16/15 0504  NA 143  < > 142  < >  140 138 137 139  K 3.5  < > 5.1  < > 4.7 4.4 4.4 4.4  CL 107  < > 106  < > 105 104 106 110  CO2 26  < > 23  < > 28 28 24 26   GLUCOSE 146*  < > 111*  < > 136* 119* 129* 126*  BUN 11  < > 20  < > 18 18 20 17   CREATININE 1.24*  < > 1.49*  < > 1.36* 1.55* 1.39* 1.25*  CALCIUM 8.3*  < > 8.8*  < > 8.2* 7.7* 7.6* 7.7*  MG 1.7  --  2.1  --  1.8  --   --   --   PHOS 3.0  --   --   --  3.8  --   --   --   < > = values in this interval not displayed. Liver Function Tests:  Recent Labs  08/16/15 0526 08/17/15 0530 12/13/15 0507  AST 19 19 20   ALT 9* 10* 11*  ALKPHOS 59 56 55  BILITOT 0.3 0.4 0.7  PROT 5.7* 5.7* 6.8  ALBUMIN 2.5* 2.4* 3.3*   No results for input(s): LIPASE, AMYLASE in the last 8760 hours. No results for input(s): AMMONIA in the last 8760 hours. CBC:  Recent Labs  08/16/15 0526 08/17/15 0530  12/12/15 2109  12/14/15 0535 12/15/15 0447 12/16/15 0504  WBC 10.6* 11.1*  < > 14.3*  < > 13.6* 18.7* 17.1*  NEUTROABS 7.2 7.8*  --  10.7*  --   --   --   --   HGB 11.9* 12.1  < > 11.1*  < > 8.6* 7.8* 8.3*  HCT 37.4 39.4  < > 35.5*  < > 28.0* 25.7* 26.9*  MCV 87.2 87.9  < > 79.6  < > 80.9 79.3 79.6  PLT  170 191  < > 294  < > 220 185 200  < > = values in this interval not displayed. Cardiac Enzymes:  Recent Labs  08/16/15 0007 08/16/15 0526  TROPONINI 0.82* 0.62*   BNP: Invalid input(s): POCBNP CBG:  Recent Labs  08/20/15 0737 08/21/15 0747 08/22/15 0801  GLUCAP 108* 99 128*    Radiological Exams: Dg Chest 1 View  12/12/2015  CLINICAL DATA:  Larey SeatFell, hip pain EXAM: CHEST 1 VIEW COMPARISON:  08/15/2015 FINDINGS: Large hiatal hernia. Heart size normal. Elevation of the right diaphragm. Right greater than left shoulder arthropathy. Lungs clear. No effusion. IMPRESSION: Numerous chronic findings with no acute abnormality. Electronically Signed   By: Esperanza Heiraymond  Rubner M.D.   On: 12/12/2015 21:57   Dg Pelvis 1-2 Views  12/12/2015  CLINICAL DATA:  Fall, right hip pain EXAM: PELVIS - 1-2 VIEW COMPARISON:  None. FINDINGS: Intertrochanteric right femur fracture as described in report of right femur. Pelvic bones intact. IMPRESSION: Right femur fracture Electronically Signed   By: Esperanza Heiraymond  Rubner M.D.   On: 12/12/2015 21:58   Ct Chest Wo Contrast  12/03/2015  CLINICAL DATA:  Eval ground glass opacities in Lt lung. No contrast due to elevated kidney function EXAM: CT CHEST WITHOUT CONTRAST TECHNIQUE: Multidetector CT imaging of the chest was performed following the standard protocol without IV contrast. COMPARISON:  08/15/2015 FINDINGS: Neck base and axilla:  No mass or adenopathy. Mediastinum and hila: Heart is normal in size and configuration. There are moderate coronary artery calcifications. Calcified atherosclerotic plaque is noted along the thoracic aorta and proximal innominate artery and left subclavian artery. There is a moderate size  hiatal hernia. No mediastinal or hilar masses or enlarged lymph nodes. Lungs and pleura: Small area of irregular opacity with associated mild bronchiectasis in the left upper lobe at the apex, centered on image 14, series 3, measuring approximately 11 x 7 mm, is  stable from the most recent prior study. There is medial lower lobe opacity abutting the medial pleura, which is stable consistent with atelectasis. Some minor reticular scarring or atelectasis is noted in both anterior lung bases. Subtle area of ground-glass opacity in the left upper lobe, image 56, measuring 11 x 8 mm is smaller and less apparent than on the prior CT. Stable calcified granuloma in the lateral left lung base. No pleural effusion.  No pneumothorax. Limited upper abdomen: Low-density left upper pole renal mass consistent with a cyst stable from an abdomen pelvis CT dated 01/08/2013. Status postcholecystectomy. No acute findings. Musculoskeletal: Bones are demineralized. There are degenerative disc changes along the thoracic spine. No osteoblastic or osteolytic lesions. IMPRESSION: 1. There are 2 areas of residual focal opacity in the left upper lobe, 1 near the apex, which is most likely due to scarring, and the other in the anterior left upper lobe more inferiorly, which has mildly decreased in size and conspicuity since the prior CT. No new lung abnormalities. Recommend additional followup CT, without contrast, in 9 months to document longer term stability. Electronically Signed   By: Amie Portland M.D.   On: 12/03/2015 14:46   Pelvis Portable  12/13/2015  CLINICAL DATA:  Status post ORIF today. EXAM: PORTABLE PELVIS 1-2 VIEWS COMPARISON:  One day prior FINDINGS: Placement of an intra medullary rod with proximal fixation screw across the previously described intratrochanteric femur fracture. Distal portion of the intramedullary rod is not imaged. No acute hardware complication. Osteopenia. Vascular calcifications. IMPRESSION: Interval fixation of previously described right femur fracture. Electronically Signed   By: Jeronimo Greaves M.D.   On: 12/13/2015 10:02   Dg C-arm 1-60 Min-no Report  12/13/2015  CLINICAL DATA: IM Nail right hip C-ARM 1-60 MINUTES Fluoroscopy was utilized by the requesting  physician.  No radiographic interpretation.   Dg Hip Operative Unilat W Or W/o Pelvis Right  12/13/2015  CLINICAL DATA:  82 year old female with a history of hip fracture EXAM: OPERATIVE RIGHT HIP WITH PELVIS; DG C-ARM 1-60 MIN-NO REPORT COMPARISON:  12/12/2015 FINDINGS: Multiple intraoperative fluoroscopic spot images during open reduction internal fixation of right hip fracture demonstrates placement of antegrade right femoral intra medullary rod with gamma nail fixation of the femoral neck. No immediate IMPRESSION: Intraoperative fluoroscopic spot images during open reduction internal fixation of right femoral neck fracture, with antegrade intra medullary rod placement and gamma nail fixation. Please refer to the dictated operative report for full details of intraoperative findings and procedure. Signed, Yvone Neu. Loreta Ave, DO [facility] Electronically Signed   By: Gilmer Mor D.O.   On: 12/13/2015 09:58   Dg Femur, Min 2 Views Right  12/12/2015  CLINICAL DATA:  Fall, femur pain EXAM: RIGHT FEMUR 2 VIEWS COMPARISON:  None. FINDINGS: Mildly impacted intertrochanteric right femur fracture with mild varus angulation. Extensive femoral artery calcification. IMPRESSION: Acute femur fracture Electronically Signed   By: Esperanza Heir M.D.   On: 12/12/2015 21:58   Dg Femur Port, Min 2 Views Right  12/13/2015  CLINICAL DATA:  Fracture repair. EXAM: RIGHT FEMUR PORTABLE 1 VIEW COMPARISON:  December 12, 2015 FINDINGS: A gamma nail and rod have been placed across the right hip fracture. Hardware  is in good position, affixed distally with a single screw. IMPRESSION: Fracture repair. Electronically Signed   By: Gerome Sam III M.D   On: 12/13/2015 12:18    Assessment/Plan   Generalized weakness Will have patient work with PT/OT as tolerated to regain strength and restore function.  Fall precautions are in place.  Right intratrochanteric femur  fracture S/p IM nailing. Continue eliquis for dvt prophylaxis. Has orthopedic follow up. Will have her work with physical therapy and occupational therapy team to help with gait training and muscle strengthening exercises.fall precautions. Skin care. Encourage to be out of bed. With her pain not under control, make changes as below  Right leg pain Post fracture and now post surgery. Change her norco to 7.5-325 mg qid prn for now and get PMR consult  Leukocytosis Afebrile, monitor cbc  Blood loss anemia Post op with transfusion of PRBC, monitor cbc  Acute renal failure Monitor bmp, hydration to be maintained  afib Rate controlled. Continue amiodarone with eliquis for anticoagulation and monitor BP/HR  Constipation Continue colace 100 mg bid  Hiatal hernia Stable, continue protonix for now  Hypothyroidism Continue synthroid and monitor  Osteoporosis Continue calcium and vitamin d supplement, continue assistance with ADLs for now and fall precautions  Recurrent cystitis Currently asymptomatic, continue macrodantin prophylactic dosing    Goals of care: short term rehabilitation   Labs/tests ordered: cbc, cmp  Family/ staff Communication: reviewed care plan with patient and nursing supervisor    Oneal Grout, MD Internal Medicine [facility] [address] [place], [place] [address] Cell Phone (Monday-Friday 8 am - 5 pm): [phone] On Call: [phone] and follow prompts after 5 pm and on weekends Office Phone: [phone] Office Fax: [phone]

## 2015-12-18 ENCOUNTER — Other Ambulatory Visit: Payer: Self-pay | Admitting: *Deleted

## 2015-12-18 MED ORDER — HYDROCODONE-ACETAMINOPHEN 7.5-325 MG PO TABS: ORAL_TABLET | ORAL | Status: DC

## 2015-12-18 NOTE — Telephone Encounter (Signed)
[facility] 

## 2015-12-19 ENCOUNTER — Other Ambulatory Visit: Payer: Self-pay | Admitting: *Deleted

## 2015-12-19 MED ORDER — HYDROCODONE-ACETAMINOPHEN 2.5-325 MG PO TABS: ORAL_TABLET | ORAL | Status: DC

## 2015-12-19 NOTE — Telephone Encounter (Signed)
[facility] 

## 2015-12-22 LAB — BASIC METABOLIC PANEL
BUN: 19 mg/dL (ref 4–21)
CREATININE: 1.2 mg/dL — AB (ref 0.5–1.1)
Glucose: 95 mg/dL
Potassium: 3.9 mmol/L (ref 3.4–5.3)
SODIUM: 142 mmol/L (ref 137–147)

## 2015-12-22 LAB — CBC AND DIFFERENTIAL
HCT: 27 % — AB (ref 36–46)
Hemoglobin: 8.3 g/dL — AB (ref 12.0–16.0)
Platelets: 527 10*3/uL — AB (ref 150–399)
WBC: 21.8 10^3/mL

## 2015-12-24 LAB — CBC AND DIFFERENTIAL
HCT: 28 % — AB (ref 36–46)
HEMOGLOBIN: 8.4 g/dL — AB (ref 12.0–16.0)
Neutrophils Absolute: 11 /uL
Platelets: 589 10*3/uL — AB (ref 150–399)
WBC: 15.4 10*3/mL

## 2015-12-29 ENCOUNTER — Encounter: Payer: Self-pay | Admitting: Adult Health

## 2015-12-29 ENCOUNTER — Non-Acute Institutional Stay (SKILLED_NURSING_FACILITY): Payer: Medicare Other | Admitting: Adult Health

## 2015-12-29 DIAGNOSIS — D72829 Elevated white blood cell count, unspecified: Secondary | ICD-10-CM

## 2015-12-29 DIAGNOSIS — N189 Chronic kidney disease, unspecified: Secondary | ICD-10-CM | POA: Diagnosis not present

## 2015-12-29 DIAGNOSIS — N390 Urinary tract infection, site not specified: Secondary | ICD-10-CM | POA: Diagnosis not present

## 2015-12-29 NOTE — Progress Notes (Signed)
Patient ID: Carla [name], female   DOB: Oct 13, 1921, 82 y.o.   MRN: [id number]    DATE:  12/29/15  MRN:  [id number]  BIRTHDAY: Oct 13, 1921  Facility:  Nursing Home Location:  [facility]  Nursing Home Room Number: 303-A  LEVEL OF CARE:  SNF [phone]  Contact Information    Name Relation Home Work [place]   Gilliam,Mary Daughter [phone]  [phone]   Gilliam,Michael Relative [phone]  [phone]   [facility],James & Darryll CapersStephen Son [phone]  [phone]       Code Status History    Date Active Date Inactive Code Status Order ID Comments User Context   12/13/2015 12:50 AM 12/16/2015  6:24 PM Full Code [id number]  Therisa DoyneAnastassia Doutova, MD Inpatient   08/15/2015 11:05 PM 08/22/2015  8:48 PM Full Code [id number]  Bobette Moavid Manuel Ortiz, MD ED   01/08/2013 12:45 PM 01/10/2013  6:19 PM Full Code [id number]  Maryruth Bunhristina P Rama, MD Inpatient   08/19/2012  3:14 PM 08/27/2012  8:53 PM Full Code [id number]  Renae FickleMackenzie Short, MD Inpatient       Chief Complaint  Patient presents with  . Acute Visit    UTI    HISTORY OF PRESENT ILLNESS:  This is a 82 year old female who was complaining of dysuria so urinalysis with CS was done. Result showed > 100,000 CFU/ml K pneumoniae, and Proteus mirabilis. Latest wbc 15.4. No fever nor hematuria noted. Talked to daughter and explained lab results and intent to put patient on antibiotics.  Daughter requested for Dr. Abel Prestoolodonato to approve antibiotics since patient has CKD and follows up with him.  [facility] sustaining a right intertrochanteric femur fracture for which she had IM nailing on 12/13/15. She had blood loss anemia and had transfusion of 1 unit PRBC. She had acute renal failure and received IV fluids.  She is currently here for a short-term rehabilitation.  PAST MEDICAL HISTORY:  Past Medical History  Diagnosis Date  . Iron deficiency anemia   . High cholesterol    . Osteoarthritis   . Female bladder prolapse   . Osteoporosis   . Cataracts, bilateral   . Gallstones   . Hemorrhoids   . Diverticulosis   . Hiatal hernia   . Atrial fibrillation (HCC)   . Cystocele   . Rectocele   . Scoliosis   . Lordosis   . Hyperlipidemia   . Osteoarthritis   . Dysrhythmia     A- fib  . Pneumonia     hx aspiration pneumonia  . Hypothyroidism   . Chronic kidney disease     chronic kidney disease  . Encounter for fitting and adjustment of pessary 04/23/2002  . Renal insufficiency      CURRENT MEDICATIONS: Reviewed  Patient's Medications  New Prescriptions   No medications on file  Previous Medications   ACETAMINOPHEN (TYLENOL) 500 MG TABLET    Take 500 mg by mouth every 8 (eight) hours as needed for mild pain. Give at 8AM, 2PM, 10PM   AMIODARONE (PACERONE) 100 MG TABLET    Take 100 mg by mouth daily.   APIXABAN (ELIQUIS) 2.5 MG TABS TABLET    Take 1 tablet (2.5 mg total) by mouth 2 (two) times daily.   CALCIUM CARB-CHOLECALCIFEROL (CALCIUM-VITAMIN D) 600-400 MG-UNIT TABS    Take 1 tablet by mouth 2 (two) times daily.    CIPROFLOXACIN (CIPRO) 500 MG TABLET    Take 500 mg  by mouth 2 (two) times daily.   DOCUSATE SODIUM (COLACE) 100 MG CAPSULE    Take 100 mg by mouth 2 (two) times daily.    DOCUSATE SODIUM (COLACE) 100 MG CAPSULE    Take 1 capsule (100 mg total) by mouth 2 (two) times daily.   HYDROCODONE-ACETAMINOPHEN 2.5-325 MG TABS    Take one tablet by mouth every night at bedtime as needed for severe pain. Do not exceed 3gm of Tylenol in 24 hours   LEVOTHYROXINE (SYNTHROID, LEVOTHROID) 75 MCG TABLET    Take 75 mcg by mouth daily before breakfast.   NITROFURANTOIN (MACRODANTIN) 50 MG CAPSULE    Take 50 mg by mouth daily.    PANTOPRAZOLE (PROTONIX) 40 MG TABLET    Take 40 mg by mouth daily.   SACCHAROMYCES BOULARDII (FLORASTOR) 250 MG CAPSULE    Take 250 mg by mouth 2 (two) times daily.   SODIUM POLYSTYRENE (KAYEXALATE) 15 GM/60ML SUSPENSION    Take 15  g by mouth once.  Modified Medications   No medications on file  Discontinued Medications   HYDROCODONE-ACETAMINOPHEN (NORCO) 7.5-325 MG TABLET    Take one tablet by mouth every 6 hours as needed for pain. DNE 4gm of Tylenol in 24 hours   HYDROCODONE-ACETAMINOPHEN (NORCO/VICODIN) 5-325 MG TABLET    Take 1 tablet by mouth every 6 (six) hours as needed for moderate pain.     Allergies  Allergen Reactions  . Bactrim [Sulfamethoxazole-Trimethoprim] Other (See Comments)    Has chronic kidney disease.  This is not recommended by nephrologist.    . Ardine Bjork [Iodinated Diagnostic Agents]   . Nsaids Other (See Comments)    Has chronic kidney disease.  This is not recommended by nephrologist.     REVIEW OF SYSTEMS:  GENERAL: no change in appetite, no fatigue, no weight changes, no fever, chills or weakness EYES: Denies change in vision, dry eyes, eye pain, itching or discharge EARS: Denies change in hearing, ringing in ears, or earache NOSE: Denies nasal congestion or epistaxis MOUTH and THROAT: Denies oral discomfort, gingival pain or bleeding, pain from teeth or hoarseness   RESPIRATORY: no cough, SOB, DOE, wheezing, hemoptysis CARDIAC: no chest pain, edema or palpitations GI: no abdominal pain, diarrhea, constipation, heart burn, nausea or vomiting GU: Denies dysuria, frequency, hematuria, incontinence, or discharge PSYCHIATRIC: Denies feeling of depression or anxiety. No report of hallucinations, insomnia, paranoia, or agitation    PHYSICAL EXAMINATION  GENERAL APPEARANCE: Well nourished. In no acute distress. Obese SKIN:  Skin is warm and dry.  HEAD: Normal in size and contour. No evidence of trauma EYES: Lids open and close normally. No blepharitis, entropion or ectropion. PERRL. Conjunctivae are clear and sclerae are white. Lenses are without opacity EARS: Pinnae are normal. Patient hears normal voice tunes of the examiner MOUTH and THROAT: Lips are without lesions. Oral mucosa  is moist and without lesions. Tongue is normal in shape, size, and color and without lesions NECK: supple, trachea midline, no neck masses, no thyroid tenderness, no thyromegaly LYMPHATICS: no LAN in the neck, no supraclavicular LAN RESPIRATORY: breathing is even & unlabored, BS CTAB CARDIAC: RRR, no murmur,no extra heart sounds, no edema GI: abdomen soft, normal BS, no masses, no tenderness, no hepatomegaly, no splenomegaly EXTREMITIES:  Able to move X 4 extremities PSYCHIATRIC: Alert and oriented X 3. Affect and behavior are appropriate  LABS/RADIOLOGY: Labs reviewed: Basic Metabolic Panel:  Recent Labs  14/78/29 0007  08/18/15 0231  12/13/15 0507 12/14/15 0535 12/15/15 0447 12/16/15  0504   NA 143  < > 142  < > 140 138 137 139   K 3.5  < > 5.1  < > 4.7 4.4 4.4 4.4   CL 107  < > 106  < > 105 104 106 110   CO2 26  < > 23  < > 28 28 24 26    GLUCOSE 146*  < > 111*  < > 136* 119* 129* 126*   BUN 11  < > 20  < > 18 18 20 17    CREATININE 1.24*  < > 1.49*  < > 1.36* 1.55* 1.39* 1.25*   CALCIUM 8.3*  < > 8.8*  < > 8.2* 7.7* 7.6* 7.7*   MG 1.7  --  2.1  --  1.8  --   --   --    PHOS 3.0  --   --   --  3.8  --   --   --    < > = values in this interval not displayed. Liver Function Tests:  Recent Labs  08/16/15 0526 08/17/15 0530 12/13/15 0507  AST 19 19 20   ALT 9* 10* 11*  ALKPHOS 59 56 55  BILITOT 0.3 0.4 0.7  PROT 5.7* 5.7* 6.8  ALBUMIN 2.5* 2.4* 3.3*   CBC:  Recent Labs  08/17/15 0530  12/12/15 2109  12/14/15 0535 12/15/15 0447 12/16/15 0504 12/24/15   WBC 11.1*  < > 14.3*  < > 13.6* 18.7* 17.1* 15.4   NEUTROABS 7.8*  --  10.7*  --   --   --   --  11   HGB 12.1  < > 11.1*  < > 8.6* 7.8* 8.3* 8.4*   HCT 39.4  < > 35.5*  < > 28.0* 25.7* 26.9* 28*   MCV 87.9  < > 79.6  < > 80.9 79.3 79.6  --    PLT 191  < > 294  < > 220 185 200 589*    Cardiac Enzymes:  Recent Labs  08/16/15 0007 08/16/15 0526  TROPONINI 0.82* 0.62*   BNP: Invalid input(s):  POCBNP CBG:  Recent Labs  08/20/15 0737 08/21/15 0747 08/22/15 0801  GLUCAP 108* 99 128*      Dg Chest 1 View  12/12/2015  CLINICAL DATA:  Larey Seat, hip pain EXAM: CHEST 1 VIEW COMPARISON:  08/15/2015 FINDINGS: Large hiatal hernia. Heart size normal. Elevation of the right diaphragm. Right greater than left shoulder arthropathy. Lungs clear. No effusion. IMPRESSION: Numerous chronic findings with no acute abnormality. Electronically Signed   By: Esperanza Heir M.D.   On: 12/12/2015 21:57   Dg Pelvis 1-2 Views  12/12/2015  CLINICAL DATA:  Fall, right hip pain EXAM: PELVIS - 1-2 VIEW COMPARISON:  None. FINDINGS: Intertrochanteric right femur fracture as described in report of right femur. Pelvic bones intact. IMPRESSION: Right femur fracture Electronically Signed   By: Esperanza Heir M.D.   On: 12/12/2015 21:58   Ct Chest Wo Contrast  12/03/2015  CLINICAL DATA:  Eval ground glass opacities in Lt lung. No contrast due to elevated kidney function EXAM: CT CHEST WITHOUT CONTRAST TECHNIQUE: Multidetector CT imaging of the chest was performed following the standard protocol without IV contrast. COMPARISON:  08/15/2015 FINDINGS: Neck base and axilla:  No mass or adenopathy. Mediastinum and hila: Heart is normal in size and configuration. There are moderate coronary artery calcifications. Calcified atherosclerotic plaque is noted along the thoracic aorta and proximal innominate artery and left subclavian artery. There is a moderate  size hiatal hernia. No mediastinal or hilar masses or enlarged lymph nodes. Lungs and pleura: Small area of irregular opacity with associated mild bronchiectasis in the left upper lobe at the apex, centered on image 14, series 3, measuring approximately 11 x 7 mm, is stable from the most recent prior study. There is medial lower lobe opacity abutting the medial pleura, which is stable consistent with atelectasis. Some minor reticular scarring or atelectasis is noted in both  anterior lung bases. Subtle area of ground-glass opacity in the left upper lobe, image 56, measuring 11 x 8 mm is smaller and less apparent than on the prior CT. Stable calcified granuloma in the lateral left lung base. No pleural effusion.  No pneumothorax. Limited upper abdomen: Low-density left upper pole renal mass consistent with a cyst stable from an abdomen pelvis CT dated 01/08/2013. Status postcholecystectomy. No acute findings. Musculoskeletal: Bones are demineralized. There are degenerative disc changes along the thoracic spine. No osteoblastic or osteolytic lesions. IMPRESSION: 1. There are 2 areas of residual focal opacity in the left upper lobe, 1 near the apex, which is most likely due to scarring, and the other in the anterior left upper lobe more inferiorly, which has mildly decreased in size and conspicuity since the prior CT. No new lung abnormalities. Recommend additional followup CT, without contrast, in 9 months to document longer term stability. Electronically Signed   By: Amie Portlandavid  Ormond M.D.   On: 12/03/2015 14:46   Pelvis Portable  12/13/2015  CLINICAL DATA:  Status post ORIF today. EXAM: PORTABLE PELVIS 1-2 VIEWS COMPARISON:  One day prior FINDINGS: Placement of an intra medullary rod with proximal fixation screw across the previously described intratrochanteric femur fracture. Distal portion of the intramedullary rod is not imaged. No acute hardware complication. Osteopenia. Vascular calcifications. IMPRESSION: Interval fixation of previously described right femur fracture. Electronically Signed   By: Jeronimo GreavesKyle  Talbot M.D.   On: 12/13/2015 10:02   Dg C-arm 1-60 Min-no Report  12/13/2015  CLINICAL DATA: IM Nail right hip C-ARM 1-60 MINUTES Fluoroscopy was utilized by the requesting physician.  No radiographic interpretation.   Dg Hip Operative Unilat W Or W/o Pelvis Right  12/13/2015  CLINICAL DATA:  82 year old female with a history of hip fracture EXAM: OPERATIVE RIGHT HIP WITH  PELVIS; DG C-ARM 1-60 MIN-NO REPORT COMPARISON:  12/12/2015 FINDINGS: Multiple intraoperative fluoroscopic spot images during open reduction internal fixation of right hip fracture demonstrates placement of antegrade right femoral intra medullary rod with gamma nail fixation of the femoral neck. No immediate IMPRESSION: Intraoperative fluoroscopic spot images during open reduction internal fixation of right femoral neck fracture, with antegrade intra medullary rod placement and gamma nail fixation. Please refer to the dictated operative report for full details of intraoperative findings and procedure. Signed, Yvone NeuJaime S. Loreta AveWagner, DO [facility] Electronically Signed   By: Gilmer MorJaime  Wagner D.O.   On: 12/13/2015 09:58   Dg Femur, Min 2 Views Right  12/12/2015  CLINICAL DATA:  Fall, femur pain EXAM: RIGHT FEMUR 2 VIEWS COMPARISON:  None. FINDINGS: Mildly impacted intertrochanteric right femur fracture with mild varus angulation. Extensive femoral artery calcification. IMPRESSION: Acute femur fracture Electronically Signed   By: Esperanza Heiraymond  Rubner M.D.   On: 12/12/2015 21:58   Dg Femur Port, Min 2 Views Right  12/13/2015  CLINICAL DATA:  Fracture repair. EXAM: RIGHT FEMUR PORTABLE 1 VIEW COMPARISON:  December 12, 2015 FINDINGS: A gamma nail and rod have been placed across the right hip fracture.  Hardware is in good position, affixed distally with a single screw. IMPRESSION: Fracture repair. Electronically Signed   By: Gerome Sam III M.D   On: 12/13/2015 12:18    ASSESSMENT/PLAN:  UTI - charge nurse infromed Dr. Abel Presto, nephrologist, regarding urine culture and agreed to have patient on Cipro 500 mg PO BID X 10 days and Florastor 250 mg PO BID X 14 days.  Chronic kidney disease - creatinine 1.25; check BMP in 3 days  Leukocytosis - wbc 15.5; check BMP in 3 days      Kenard Gower, NP [facility] [phone]

## 2015-12-31 LAB — CBC AND DIFFERENTIAL
HEMATOCRIT: 31 % — AB (ref 36–46)
Hemoglobin: 8.9 g/dL — AB (ref 12.0–16.0)
Platelets: 522 10*3/uL — AB (ref 150–399)
WBC: 11.2 10^3/mL

## 2015-12-31 LAB — BASIC METABOLIC PANEL
BUN: 16 mg/dL (ref 4–21)
Creatinine: 1.1 mg/dL (ref 0.5–1.1)
Glucose: 111 mg/dL
Potassium: 5.6 mmol/L — AB (ref 3.4–5.3)
SODIUM: 147 mmol/L (ref 137–147)

## 2016-01-01 ENCOUNTER — Non-Acute Institutional Stay (SKILLED_NURSING_FACILITY): Payer: Medicare Other | Admitting: Adult Health

## 2016-01-01 ENCOUNTER — Encounter: Payer: Self-pay | Admitting: Adult Health

## 2016-01-01 DIAGNOSIS — E875 Hyperkalemia: Secondary | ICD-10-CM | POA: Diagnosis not present

## 2016-01-01 DIAGNOSIS — E870 Hyperosmolality and hypernatremia: Secondary | ICD-10-CM

## 2016-01-01 NOTE — Progress Notes (Signed)
Patient ID: Carla [name], female   DOB: [birth year], 82 y.o.   MRN: [id number]    DATE:  01/01/16  MRN:  [id number]  BIRTHDAY: 11/19/1921  Facility:  Nursing Home Location:  [facility]  Nursing Home Room Number: 303-A  LEVEL OF CARE:  SNF [phone])  Contact Information    Name Relation Home Work [place] Daughter [phone]  [phone]   Gilliam,Michael Relative [phone]  [phone]   [facility] & Darryll Capers [phone]  [phone]       Code Status History    Date Active Date Inactive Code Status Order ID Comments User Context   12/13/2015 12:50 AM 12/16/2015  6:24 PM Full Code [id number]  Therisa Doyne, MD Inpatient   08/15/2015 11:05 PM 08/22/2015  8:48 PM Full Code [id number]  Bobette Mo, MD ED   01/08/2013 12:45 PM 01/10/2013  6:19 PM Full Code [id number]  Maryruth Bun Rama, MD Inpatient   08/19/2012  3:14 PM 08/27/2012  8:53 PM Full Code [id number]  Renae Fickle, MD Inpatient       Chief Complaint  Patient presents with  . Acute Visit    Hypernatremia, hyperkalemia    HISTORY OF PRESENT ILLNESS:  This is a 82 year old female who has CKD  and was recently started on Cipro for UTI. Charge nurse notified  daughter Kayexalate orders. Daughter requested not to give Kayexalate and to get another BMP next day.  She has been admitted to [facility] on 12/16/15 from [facility] post fall sustaining a right intertrochanteric femur fracture for which she had IM nailing on 12/13/15. She had blood loss anemia and had transfusion of 1 unit PRBC. She had acute renal failure and received IV fluids.  She is currently here for a short-term rehabilitation.  PAST MEDICAL HISTORY:  Past Medical History  Diagnosis Date  . Iron deficiency anemia   . High cholesterol   . Osteoarthritis   . Female bladder prolapse   . Osteoporosis   . Cataracts, bilateral   . Gallstones   . Hemorrhoids   . Diverticulosis   . Hiatal hernia    . Atrial fibrillation (HCC)   . Cystocele   . Rectocele   . Scoliosis   . Lordosis   . Hyperlipidemia   . Osteoarthritis   . Dysrhythmia     A- fib  . Pneumonia     hx aspiration pneumonia  . Hypothyroidism   . Chronic kidney disease     chronic kidney disease  . Encounter for fitting and adjustment of pessary 04/23/2002  . Renal insufficiency      CURRENT MEDICATIONS: Reviewed  Patient's Medications  New Prescriptions   No medications on file  Previous Medications   ACETAMINOPHEN (TYLENOL) 500 MG TABLET    Take 500 mg by mouth every 8 (eight) hours as needed for mild pain. Give at 8AM, 2PM, 10PM   AMIODARONE (PACERONE) 100 MG TABLET    Take 100 mg by mouth daily.   APIXABAN (ELIQUIS) 2.5 MG TABS TABLET    Take 1 tablet (2.5 mg total) by mouth 2 (two) times daily.   CALCIUM CARB-CHOLECALCIFEROL (CALCIUM-VITAMIN D) 600-400 MG-UNIT TABS    Take 1 tablet by mouth 2 (two) times daily.    CIPROFLOXACIN (CIPRO) 500 MG TABLET    Take 500 mg by mouth 2 (two) times daily.   DOCUSATE SODIUM (COLACE) 100 MG CAPSULE    Take 100 mg by mouth 2 (two) times daily.  DOCUSATE SODIUM (COLACE) 100 MG CAPSULE    Take 1 capsule (100 mg total) by mouth 2 (two) times daily.   HYDROCODONE-ACETAMINOPHEN 2.5-325 MG TABS    Take one tablet by mouth every night at bedtime as needed for severe pain. Do not exceed 3gm of Tylenol in 24 hours   LEVOTHYROXINE (SYNTHROID, LEVOTHROID) 75 MCG TABLET    Take 75 mcg by mouth daily before breakfast.   NITROFURANTOIN (MACRODANTIN) 50 MG CAPSULE    Take 50 mg by mouth daily.    PANTOPRAZOLE (PROTONIX) 40 MG TABLET    Take 40 mg by mouth daily.   SACCHAROMYCES BOULARDII (FLORASTOR) 250 MG CAPSULE    Take 250 mg by mouth 2 (two) times daily.   SODIUM POLYSTYRENE (KAYEXALATE) 15 GM/60ML SUSPENSION    Take 15 g by mouth once.  Modified Medications   No medications on file  Discontinued Medications   No medications on file     Allergies  Allergen Reactions  .  Bactrim [Sulfamethoxazole-Trimethoprim] Other (See Comments)    Has chronic kidney disease.  This is not recommended by nephrologist.    . Ardine BjorkIvp Dye [Iodinated Diagnostic Agents]   . Nsaids Other (See Comments)    Has chronic kidney disease.  This is not recommended by nephrologist.     REVIEW OF SYSTEMS:  GENERAL: no change in appetite, no fatigue, no weight changes, no fever, chills or weakness EYES: Denies change in vision, dry eyes, eye pain, itching or discharge EARS: Denies change in hearing, ringing in ears, or earache NOSE: Denies nasal congestion or epistaxis MOUTH and THROAT: Denies oral discomfort, gingival pain or bleeding, pain from teeth or hoarseness   RESPIRATORY: no cough, SOB, DOE, wheezing, hemoptysis CARDIAC: no chest pain, edema or palpitations GI: no abdominal pain, diarrhea, constipation, heart burn, nausea or vomiting GU: Denies dysuria, frequency, hematuria, incontinence, or discharge PSYCHIATRIC: Denies feeling of depression or anxiety. No report of hallucinations, insomnia, paranoia, or agitation    PHYSICAL EXAMINATION  GENERAL APPEARANCE: Well nourished. In no acute distress. Obese SKIN:  Skin is warm and dry.  HEAD: Normal in size and contour. No evidence of trauma EYES: Lids open and close normally. No blepharitis, entropion or ectropion. PERRL. Conjunctivae are clear and sclerae are white. Lenses are without opacity EARS: Pinnae are normal. Patient hears normal voice tunes of the examiner MOUTH and THROAT: Lips are without lesions. Oral mucosa is moist and without lesions. Tongue is normal in shape, size, and color and without lesions NECK: supple, trachea midline, no neck masses, no thyroid tenderness, no thyromegaly LYMPHATICS: no LAN in the neck, no supraclavicular LAN RESPIRATORY: breathing is even & unlabored, BS CTAB CARDIAC: RRR, no murmur,no extra heart sounds, no edema GI: abdomen soft, normal BS, no masses, no tenderness, no hepatomegaly, no  splenomegaly EXTREMITIES:  Able to move X 4 extremities PSYCHIATRIC: Alert and oriented X 3. Affect and behavior are appropriate  LABS/RADIOLOGY: Labs reviewed: Basic Metabolic Panel:  Recent Labs  40/98/1101/05/21 0007  08/18/15 0231  12/13/15 0507 12/14/15 0535 12/15/15 0447 12/16/15 0504 12/31/15  NA 143  < > 142  < > 140 138 137 139 147  K 3.5  < > 5.1  < > 4.7 4.4 4.4 4.4 5.6*  CL 107  < > 106  < > 105 104 106 110  --   CO2 26  < > 23  < > 28 28 24 26   --   GLUCOSE 146*  < > 111*  < >  136* 119* 129* 126*  --   BUN 11  < > 20  < > 18 18 20 17 16   CREATININE 1.24*  < > 1.49*  < > 1.36* 1.55* 1.39* 1.25* 1.1  CALCIUM 8.3*  < > 8.8*  < > 8.2* 7.7* 7.6* 7.7*  --   MG 1.7  --  2.1  --  1.8  --   --   --   --   PHOS 3.0  --   --   --  3.8  --   --   --   --   < > = values in this interval not displayed. Liver Function Tests:  Recent Labs  08/16/15 0526 08/17/15 0530 12/13/15 0507  AST 19 19 20   ALT 9* 10* 11*  ALKPHOS 59 56 55  BILITOT 0.3 0.4 0.7  PROT 5.7* 5.7* 6.8  ALBUMIN 2.5* 2.4* 3.3*   CBC:  Recent Labs  08/17/15 0530  12/12/15 2109  12/14/15 0535 12/15/15 0447 12/16/15 0504 12/24/15 12/31/15  WBC 11.1*  < > 14.3*  < > 13.6* 18.7* 17.1* 15.4 11.2  NEUTROABS 7.8*  --  10.7*  --   --   --   --  11  --   HGB 12.1  < > 11.1*  < > 8.6* 7.8* 8.3* 8.4* 8.9*  HCT 39.4  < > 35.5*  < > 28.0* 25.7* 26.9* 28* 31*  MCV 87.9  < > 79.6  < > 80.9 79.3 79.6  --   --   PLT 191  < > 294  < > 220 185 200 589* 522*  < > = values in this interval not displayed.  CBG:  Recent Labs  08/20/15 0737 08/21/15 0747 08/22/15 0801  GLUCAP 108* 99 128*    Dg Chest 1 View  12/12/2015  CLINICAL DATA:  Larey Seat, hip pain EXAM: CHEST 1 VIEW COMPARISON:  08/15/2015 FINDINGS: Large hiatal hernia. Heart size normal. Elevation of the right diaphragm. Right greater than left shoulder arthropathy. Lungs clear. No effusion. IMPRESSION: Numerous chronic findings with no acute abnormality.  Electronically Signed   By: Esperanza Heir M.D.   On: 12/12/2015 21:57   Dg Pelvis 1-2 Views  12/12/2015  CLINICAL DATA:  Fall, right hip pain EXAM: PELVIS - 1-2 VIEW COMPARISON:  None. FINDINGS: Intertrochanteric right femur fracture as described in report of right femur. Pelvic bones intact. IMPRESSION: Right femur fracture Electronically Signed   By: Esperanza Heir M.D.   On: 12/12/2015 21:58   Ct Chest Wo Contrast  12/03/2015  CLINICAL DATA:  Eval ground glass opacities in Lt lung. No contrast due to elevated kidney function EXAM: CT CHEST WITHOUT CONTRAST TECHNIQUE: Multidetector CT imaging of the chest was performed following the standard protocol without IV contrast. COMPARISON:  08/15/2015 FINDINGS: Neck base and axilla:  No mass or adenopathy. Mediastinum and hila: Heart is normal in size and configuration. There are moderate coronary artery calcifications. Calcified atherosclerotic plaque is noted along the thoracic aorta and proximal innominate artery and left subclavian artery. There is a moderate size hiatal hernia. No mediastinal or hilar masses or enlarged lymph nodes. Lungs and pleura: Small area of irregular opacity with associated mild bronchiectasis in the left upper lobe at the apex, centered on image 14, series 3, measuring approximately 11 x 7 mm, is stable from the most recent prior study. There is medial lower lobe opacity abutting the medial pleura, which is stable consistent with atelectasis. Some minor reticular scarring or  atelectasis is noted in both anterior lung bases. Subtle area of ground-glass opacity in the left upper lobe, image 56, measuring 11 x 8 mm is smaller and less apparent than on the prior CT. Stable calcified granuloma in the lateral left lung base. No pleural effusion.  No pneumothorax. Limited upper abdomen: Low-density left upper pole renal mass consistent with a cyst stable from an abdomen pelvis CT dated 01/08/2013. Status postcholecystectomy. No acute  findings. Musculoskeletal: Bones are demineralized. There are degenerative disc changes along the thoracic spine. No osteoblastic or osteolytic lesions. IMPRESSION: 1. There are 2 areas of residual focal opacity in the left upper lobe, 1 near the apex, which is most likely due to scarring, and the other in the anterior left upper lobe more inferiorly, which has mildly decreased in size and conspicuity since the prior CT. No new lung abnormalities. Recommend additional followup CT, without contrast, in 9 months to document longer term stability. Electronically Signed   By: Amie Portlandavid  Ormond M.D.   On: 12/03/2015 14:46   Pelvis Portable  12/13/2015  CLINICAL DATA:  Status post ORIF today. EXAM: PORTABLE PELVIS 1-2 VIEWS COMPARISON:  One day prior FINDINGS: Placement of an intra medullary rod with proximal fixation screw across the previously described intratrochanteric femur fracture. Distal portion of the intramedullary rod is not imaged. No acute hardware complication. Osteopenia. Vascular calcifications. IMPRESSION: Interval fixation of previously described right femur fracture. Electronically Signed   By: Jeronimo GreavesKyle  Talbot M.D.   On: 12/13/2015 10:02   Dg C-arm 1-60 Min-no Report  12/13/2015  CLINICAL DATA: IM Nail right hip C-ARM 1-60 MINUTES Fluoroscopy was utilized by the requesting physician.  No radiographic interpretation.   Dg Hip Operative Unilat W Or W/o Pelvis Right  12/13/2015  CLINICAL DATA:  82 year old female with a history of hip fracture EXAM: OPERATIVE RIGHT HIP WITH PELVIS; DG C-ARM 1-60 MIN-NO REPORT COMPARISON:  12/12/2015 FINDINGS: Multiple intraoperative fluoroscopic spot images during open reduction internal fixation of right hip fracture demonstrates placement of antegrade right femoral intra medullary rod with gamma nail fixation of the femoral neck. No immediate IMPRESSION: Intraoperative fluoroscopic spot images during open reduction internal fixation of right femoral neck fracture, with  antegrade intra medullary rod placement and gamma nail fixation. Please refer to the dictated operative report for full details of intraoperative findings and procedure. Signed, Yvone NeuJaime S. Loreta AveWagner, DO [facility] Electronically Signed   By: Gilmer MorJaime  Wagner D.O.   On: 12/13/2015 09:58   Dg Femur, Min 2 Views Right  12/12/2015  CLINICAL DATA:  Fall, femur pain EXAM: RIGHT FEMUR 2 VIEWS COMPARISON:  None. FINDINGS: Mildly impacted intertrochanteric right femur fracture with mild varus angulation. Extensive femoral artery calcification. IMPRESSION: Acute femur fracture Electronically Signed   By: Esperanza Heiraymond  Rubner M.D.   On: 12/12/2015 21:58   Dg Femur Port, Min 2 Views Right  12/13/2015  CLINICAL DATA:  Fracture repair. EXAM: RIGHT FEMUR PORTABLE 1 VIEW COMPARISON:  December 12, 2015 FINDINGS: A gamma nail and rod have been placed across the right hip fracture. Hardware is in good position, affixed distally with a single screw. IMPRESSION: Fracture repair. Electronically Signed   By: Gerome Samavid  Williams III M.D   On: 12/13/2015 12:18    ASSESSMENT/PLAN:  Hyperkalemia - daughter does not want patient to take Kayexalate and to check BMP in AM  Hypernatremia - encourage oral fluid intake; will monitor     Kenard GowerMonina Medina-Vargas, NP [facility] [phone]

## 2016-01-02 LAB — BASIC METABOLIC PANEL
BUN: 16 mg/dL (ref 4–21)
Creatinine: 1.1 mg/dL (ref 0.5–1.1)
GLUCOSE: 99 mg/dL
Potassium: 5.1 mmol/L (ref 3.4–5.3)
Sodium: 143 mmol/L (ref 137–147)

## 2016-01-13 ENCOUNTER — Encounter: Payer: Self-pay | Admitting: Adult Health

## 2016-01-13 ENCOUNTER — Non-Acute Institutional Stay (SKILLED_NURSING_FACILITY): Payer: Medicare Other | Admitting: Adult Health

## 2016-01-13 DIAGNOSIS — N3090 Cystitis, unspecified without hematuria: Secondary | ICD-10-CM

## 2016-01-13 DIAGNOSIS — S72101S Unspecified trochanteric fracture of right femur, sequela: Secondary | ICD-10-CM

## 2016-01-13 DIAGNOSIS — K5900 Constipation, unspecified: Secondary | ICD-10-CM | POA: Diagnosis not present

## 2016-01-13 DIAGNOSIS — N3080 Other cystitis without hematuria: Secondary | ICD-10-CM | POA: Diagnosis not present

## 2016-01-13 DIAGNOSIS — E038 Other specified hypothyroidism: Secondary | ICD-10-CM | POA: Diagnosis not present

## 2016-01-13 DIAGNOSIS — D62 Acute posthemorrhagic anemia: Secondary | ICD-10-CM

## 2016-01-13 DIAGNOSIS — M810 Age-related osteoporosis without current pathological fracture: Secondary | ICD-10-CM | POA: Diagnosis not present

## 2016-01-13 DIAGNOSIS — K449 Diaphragmatic hernia without obstruction or gangrene: Secondary | ICD-10-CM | POA: Diagnosis not present

## 2016-01-13 DIAGNOSIS — R531 Weakness: Secondary | ICD-10-CM

## 2016-01-13 DIAGNOSIS — I480 Paroxysmal atrial fibrillation: Secondary | ICD-10-CM

## 2016-01-13 NOTE — Progress Notes (Signed)
Patient ID: Carla [name], female   DOB: Feb 10, 1922, 82 y.o.   MRN: [id number]    DATE:  01/13/16  MRN:  [id number]  BIRTHDAY: 01/12/1922  Facility:  Nursing Home Location:  [facility]  Nursing Home Room Number: 303-A  LEVEL OF CARE:  SNF [id number])  Contact Information    Name Relation Home Work [place] Daughter [phone]  [phone]   Gilliam,Michael Relative [phone]  [phone]   [facility] & Darryll Capers [phone]  [phone]       Code Status History    Date Active Date Inactive Code Status Order ID Comments User Context   12/13/2015 12:50 AM 12/16/2015  6:24 PM Full Code [id number]  Therisa Doyne, MD Inpatient   08/15/2015 11:05 PM 08/22/2015  8:48 PM Full Code [id number]  Bobette Mo, MD ED   01/08/2013 12:45 PM 01/10/2013  6:19 PM Full Code [id number]  Maryruth Bun Rama, MD Inpatient   08/19/2012  3:14 PM 08/27/2012  8:53 PM Full Code [id number]  Renae Fickle, MD Inpatient       Chief Complaint  Patient presents with  . Discharge Note    HISTORY OF PRESENT ILLNESS:  This is a 82 year old female who is for discharge home with Home health PT, OT and CNA.  She has been admitted to [facility] on 12/16/15 from [facility] post fall sustaining a right intertrochanteric femur fracture for which she had IM nailing on 12/13/15. She had blood loss anemia and had transfusion of 1 unit PRBC. She had acute renal failure and received IV fluids.  Patient was admitted to this facility for short-term rehabilitation after the patient's recent hospitalization.  Patient has completed SNF rehabilitation and therapy has cleared the patient for discharge.   PAST MEDICAL HISTORY:  Past Medical History  Diagnosis Date  . Iron deficiency anemia   . High cholesterol   . Osteoarthritis   . Female bladder prolapse   . Osteoporosis   . Cataracts, bilateral   . Gallstones   . Hemorrhoids   . Diverticulosis   . Hiatal hernia    . Atrial fibrillation (HCC)   . Cystocele   . Rectocele   . Scoliosis   . Lordosis   . Hyperlipidemia   . Osteoarthritis   . Dysrhythmia     A- fib  . Pneumonia     hx aspiration pneumonia  . Hypothyroidism   . Chronic kidney disease     chronic kidney disease  . Encounter for fitting and adjustment of pessary 04/23/2002  . Renal insufficiency   . Hyperkalemia   . Urinary tract infection without hematuria   . Leukocytosis      CURRENT MEDICATIONS: Reviewed  Patient's Medications  New Prescriptions   No medications on file  Previous Medications   ACETAMINOPHEN (TYLENOL) 500 MG TABLET    Take 500 mg by mouth 2 (two) times daily. Give at 8AM, 2PM   ACETAMINOPHEN (TYLENOL) 500 MG TABLET    Take 1,000 mg by mouth at bedtime.   AMIODARONE (PACERONE) 100 MG TABLET    Take 100 mg by mouth daily.   APIXABAN (ELIQUIS) 2.5 MG TABS TABLET    Take 1 tablet (2.5 mg total) by mouth 2 (two) times daily.   CALCIUM CARB-CHOLECALCIFEROL (CALCIUM-VITAMIN D) 600-400 MG-UNIT TABS    Take 1 tablet by mouth 2 (two) times daily.    DOCUSATE SODIUM (COLACE) 100 MG CAPSULE    Take 100 mg by mouth 2 (two)  times daily.    HYDROCODONE-ACETAMINOPHEN 2.5-325 MG TABS    Take one tablet by mouth every night at bedtime as needed for severe pain. Do not exceed 3gm of Tylenol in 24 hours   LEVOTHYROXINE (SYNTHROID, LEVOTHROID) 75 MCG TABLET    Take 75 mcg by mouth daily before breakfast.   NITROFURANTOIN (MACRODANTIN) 50 MG CAPSULE    Take 50 mg by mouth daily.    NUTRITIONAL SUPPLEMENTS (NUTRITIONAL SHAKE PO)    Take 4 oz by mouth 2 (two) times daily.   PANTOPRAZOLE (PROTONIX) 40 MG TABLET    Take 40 mg by mouth daily.  Modified Medications   No medications on file  Discontinued Medications   DOCUSATE SODIUM (COLACE) 100 MG CAPSULE    Take 1 capsule (100 mg total) by mouth 2 (two) times daily.   SODIUM POLYSTYRENE (KAYEXALATE) 15 GM/60ML SUSPENSION    Take 15 g by mouth once.     Allergies  Allergen  Reactions  . Bactrim [Sulfamethoxazole-Trimethoprim] Other (See Comments)    Has chronic kidney disease.  This is not recommended by nephrologist.    . Ardine BjorkIvp Dye [Iodinated Diagnostic Agents]   . Nsaids Other (See Comments)    Has chronic kidney disease.  This is not recommended by nephrologist.     REVIEW OF SYSTEMS:  GENERAL: no change in appetite, no fatigue, no weight changes, no fever, chills or weakness EYES: Denies change in vision, dry eyes, eye pain, itching or discharge EARS: Denies change in hearing, ringing in ears, or earache NOSE: Denies nasal congestion or epistaxis MOUTH and THROAT: Denies oral discomfort, gingival pain or bleeding, pain from teeth or hoarseness   RESPIRATORY: no cough, SOB, DOE, wheezing, hemoptysis CARDIAC: no chest pain, edema or palpitations GI: no abdominal pain, diarrhea, constipation, heart burn, nausea or vomiting GU: Denies dysuria, frequency, hematuria, incontinence, or discharge PSYCHIATRIC: Denies feeling of depression or anxiety. No report of hallucinations, insomnia, paranoia, or agitation    PHYSICAL EXAMINATION  GENERAL APPEARANCE: Well nourished. In no acute distress. Obese SKIN:  Right hip surgical incision is dry, no erythema.  HEAD: Normal in size and contour. No evidence of trauma EYES: Lids open and close normally. No blepharitis, entropion or ectropion. PERRL. Conjunctivae are clear and sclerae are white. Lenses are without opacity EARS: Pinnae are normal. Patient hears normal voice tunes of the examiner MOUTH and THROAT: Lips are without lesions. Oral mucosa is moist and without lesions. Tongue is normal in shape, size, and color and without lesions NECK: supple, trachea midline, no neck masses, no thyroid tenderness, no thyromegaly LYMPHATICS: no LAN in the neck, no supraclavicular LAN RESPIRATORY: breathing is even & unlabored, BS CTAB CARDIAC: RRR, no murmur,no extra heart sounds, no edema GI: abdomen soft, normal BS, no  masses, no tenderness, no hepatomegaly, no splenomegaly EXTREMITIES:  Able to move X 4 extremities PSYCHIATRIC: Alert and oriented X 3. Affect and behavior are appropriate  LABS/RADIOLOGY: Labs reviewed: Basic Metabolic Panel:  Recent Labs  40/98/1101/05/21 0007  08/18/15 0231  12/13/15 0507 12/14/15 0535 12/15/15 0447 12/16/15 0504 12/22/15 12/31/15 01/02/16  NA 143  < > 142  < > 140 138 137 139 142 147 143  K 3.5  < > 5.1  < > 4.7 4.4 4.4 4.4 3.9 5.6* 5.1  CL 107  < > 106  < > 105 104 106 110  --   --   --   CO2 26  < > 23  < > 28 28 24 26   --   --   --  GLUCOSE 146*  < > 111*  < > 136* 119* 129* 126*  --   --   --   BUN 11  < > 20  < > 18 18 20 17 19 16 16   CREATININE 1.24*  < > 1.49*  < > 1.36* 1.55* 1.39* 1.25* 1.2* 1.1 1.1  CALCIUM 8.3*  < > 8.8*  < > 8.2* 7.7* 7.6* 7.7*  --   --   --   MG 1.7  --  2.1  --  1.8  --   --   --   --   --   --   PHOS 3.0  --   --   --  3.8  --   --   --   --   --   --   < > = values in this interval not displayed. Liver Function Tests:  Recent Labs  08/16/15 0526 08/17/15 0530 12/13/15 0507  AST 19 19 20   ALT 9* 10* 11*  ALKPHOS 59 56 55  BILITOT 0.3 0.4 0.7  PROT 5.7* 5.7* 6.8  ALBUMIN 2.5* 2.4* 3.3*   CBC:  Recent Labs  08/17/15 0530  12/12/15 2109  12/14/15 0535 12/15/15 0447 12/16/15 0504 12/22/15 12/24/15 12/31/15  WBC 11.1*  < > 14.3*  < > 13.6* 18.7* 17.1* 21.8 15.4 11.2  NEUTROABS 7.8*  --  10.7*  --   --   --   --   --  11  --   HGB 12.1  < > 11.1*  < > 8.6* 7.8* 8.3* 8.3* 8.4* 8.9*  HCT 39.4  < > 35.5*  < > 28.0* 25.7* 26.9* 27* 28* 31*  MCV 87.9  < > 79.6  < > 80.9 79.3 79.6  --   --   --   PLT 191  < > 294  < > 220 185 200 527* 589* 522*  < > = values in this interval not displayed.  CBG:  Recent Labs  08/20/15 0737 08/21/15 0747 08/22/15 0801  GLUCAP 108* 99 128*      ASSESSMENT/PLAN:  Generalized weakness - for Home health PT, OT and CNA  Right intertrochanteric femur fracture S/P IM nailing -  continue Norco 2.5/325 mg 1 tab by mouth daily at bedtime when necessary, Tylenol ES 500 mg 2 caplets = 1000 mg by mouth daily at bedtime for pain; Eliquis 2.5 mg 1 tab by mouth twice a day for DVT prophylaxis; follow-up with orthopedic surgeon  Recurrent Cystitis - continue Macrodantin 50 mg 1 capsule by mouth daily  Hiatal  of hernia - stable; continue Protonix 40 mg 1 tab by mouth daily  Hypothyroidism - continue levothyroxine 75 g 1 tab by mouth daily Lab Results  Component Value Date   TSH 0.887 12/13/2015   Constipation - continue Colace 100 mg 1 capsule by mouth twice a day  Anemia, acute blood loss -  stable Lab Results  Component Value Date   HGB 8.9* 12/31/2015    Atrial fibrillation - controlled; continue amiodarone 100 mg 1 tab by mouth daily  Osteoporosis - continue calcium and vitamin D supplementation; fall precaution    I have filled out patient's discharge paperwork and written prescriptions.  Patient will receive home health PT, OT and CNA.  DME provided:  None  Total discharge time: Greater than 30 minutes  Discharge time involved coordination of the discharge process with social worker, nursing staff and therapy department. Medical justification for home health services verified.  Durenda Age, NP [employer] [phone]

## 2016-02-12 ENCOUNTER — Other Ambulatory Visit: Payer: Self-pay | Admitting: Adult Health

## 2016-03-17 ENCOUNTER — Encounter: Payer: Self-pay | Admitting: Cardiology

## 2016-03-30 ENCOUNTER — Other Ambulatory Visit: Payer: Self-pay | Admitting: Adult Health

## 2016-03-31 NOTE — Progress Notes (Signed)
Alfonzo FellerEarline S Grenier Date of Birth: [birth year] Medical Record #[id number]  History of Present Illness: Carla [name] is seen today for followup Afib.  She was hospitalized in February 2014 with atrial fibrillation in the setting of urosepsis. She was placed on amiodarone and converted to normal sinus rhythm. Echocardiogram at that time showed mild right ventricular and right atrial enlargement. There was moderate pulmonary hypertension. In July 2014  Upper endoscopy demonstrated a massive hiatal hernia with esophagitis and gastritis. She does have a history of GI bleeding. She has been started on synthroid for hypothyroidism. She also has a history of vesicovaginal and vesicorectal fistulas. [facility] and managed conservatively. She was hospitalized in February 2017 with increased SOB and leg swelling. She was diagnosed with a PE and right leg DVT. She was anticoagulated with  Coumadin.  She also has an IVC filter in place. She was noted to have Afib initially on admission but then converted to NSR. Echo  Showed normal LV function with mild pulmonary HTN.  She was admitted after a mechanical fall in June 2017 with a right hip fracture. She had an intramedullary nail placed. She was switched to Eliquis post op.  On follow up today she denies any chest pain, SOB, or palpitations. Her leg swelling has resolved. She has no bleeding issues.   Current Outpatient Prescriptions on File Prior to Visit  Medication Sig Dispense Refill  . acetaminophen (TYLENOL) 500 MG tablet Take 1,000 mg by mouth at bedtime.    Marland Kitchen. amiodarone (PACERONE) 100 MG tablet Take 100 mg by mouth daily.    Marland Kitchen. apixaban (ELIQUIS) 2.5 MG TABS tablet Take 1 tablet (2.5 mg total) by mouth 2 (two) times daily. 30 tablet 0  . Calcium Carb-Cholecalciferol (CALCIUM-VITAMIN D) 600-400 MG-UNIT TABS Take 1 tablet by mouth 2 (two) times daily.     Marland Kitchen. docusate sodium (COLACE) 100 MG capsule Take 100 mg by mouth 2 (two) times daily.     Marland Kitchen.  levothyroxine (SYNTHROID, LEVOTHROID) 75 MCG tablet Take 75 mcg by mouth daily before breakfast.    . nitrofurantoin (MACRODANTIN) 50 MG capsule Take 50 mg by mouth daily.     . pantoprazole (PROTONIX) 40 MG tablet Take 40 mg by mouth daily.     No current facility-administered medications on file prior to visit.     Allergies  Allergen Reactions  . Bactrim [Sulfamethoxazole-Trimethoprim] Other (See Comments)    Has chronic kidney disease.  This is not recommended by nephrologist.    . Ardine BjorkIvp Dye [Iodinated Diagnostic Agents]   . Nsaids Other (See Comments)    Has chronic kidney disease.  This is not recommended by nephrologist.    Past Medical History:  Diagnosis Date  . Atrial fibrillation (HCC)   . Cataracts, bilateral   . Chronic kidney disease    chronic kidney disease  . Cystocele   . Diverticulosis   . Dysrhythmia    A- fib  . Encounter for fitting and adjustment of pessary 04/23/2002  . Female bladder prolapse   . Gallstones   . Hemorrhoids   . Hiatal hernia   . High cholesterol   . Hyperkalemia   . Hyperlipidemia   . Hypothyroidism   . Iron deficiency anemia   . Leukocytosis   . Lordosis   . Osteoarthritis   . Osteoarthritis   . Osteoporosis   . Pneumonia    hx aspiration pneumonia  . Rectocele   . Renal insufficiency   . Scoliosis   .  Urinary tract infection without hematuria     Past Surgical History:  Procedure Laterality Date  . CATARACT EXTRACTION    . CHOLECYSTECTOMY    . COLONOSCOPY    . ENDOMETRIAL BIOPSY  09/2009  . ESOPHAGOGASTRODUODENOSCOPY    . ESOPHAGOGASTRODUODENOSCOPY N/A 01/09/2013   Procedure: ESOPHAGOGASTRODUODENOSCOPY (EGD);  Surgeon: Vertell Novak., MD;  Location: [facility];  Service: Endoscopy;  Laterality: N/A;  . INTRAMEDULLARY (IM) NAIL INTERTROCHANTERIC Right 12/13/2015   Procedure: INTRAMEDULLARY (IM) NAIL INTERTROCHANTRIC;  Surgeon: Samson Frederic, MD;  Location: [facility];  Service: Orthopedics;  Laterality: Right;  .  RECTAL ULTRASOUND N/A 04/10/2014   Procedure: RECTAL ULTRASOUND;  Surgeon: Romie Levee, MD;  Location: [facility];  Service: Endoscopy;  Laterality: N/A;    History  Smoking Status  . Never Smoker  Smokeless Tobacco  . Never Used    History  Alcohol Use No    Family History  Problem Relation Age of Onset  . Diabetes Father   . Leukemia Father   . Breast cancer Paternal Aunt   . Breast cancer Paternal Aunt   . Cancer      several family members on dad's side of family  . Heart failure Brother     Pt cannot confirm this history    Review of Systems: As noted in history of present illness.  All other systems were reviewed and are negative.  Physical Exam: BP (!) 144/62   Pulse 80   Ht 5' 2.5" (1.588 m)   Wt 167 lb 3.2 oz (75.8 kg)   SpO2 94%   BMI 30.09 kg/m  She is a pleasant overweight white female in no acute distress. HEENT is unremarkable. Lungs: Clear Cardiovascular: Regular rate and rhythm without gallop, murmur, or click. Abdomen: Soft and nontender. Bowel sounds positive. No masses or bruits. Extremities: Pulses are 2+ and symmetric. She has 1+ right ankle edema. Neuro: Nonfocal.  LABORATORY DATA: Lab Results  Component Value Date   WBC 11.2 12/31/2015   HGB 8.9 (A) 12/31/2015   HCT 31 (A) 12/31/2015   PLT 522 (A) 12/31/2015   GLUCOSE 126 (H) 12/16/2015   ALT 11 (L) 12/13/2015   AST 20 12/13/2015   NA 143 01/02/2016   K 5.1 01/02/2016   CL 110 12/16/2015   CREATININE 1.1 01/02/2016   BUN 16 01/02/2016   CO2 26 12/16/2015   TSH 0.887 12/13/2015   INR 0.91 12/12/2015   HGBA1C 6.1 (H) 01/08/2013   Echo: 08/16/15:Study Conclusions  - Left ventricle: The cavity size was normal. There was mild focal basal hypertrophy of the septum. Systolic function was normal. The estimated ejection fraction was in the range of 50% to 55%. Wall motion was normal; there were no regional wall motion abnormalities. Doppler parameters are consistent with  abnormal left ventricular relaxation (grade 1 diastolic dysfunction). - Aortic valve: There was trivial regurgitation. - Mitral valve: Calcified annulus. There was mild regurgitation. - Pulmonary arteries: Systolic pressure was mildly increased. PA peak pressure: 34 mm Hg (S).  Impressions:  - Normal LV systolic function; grade 1 diastolic dysfunction; trace AI; mild MR and TR; mildly elevated pulmonary pressure.   Assessment / Plan: 1. Atrial fibrillation- paroxysmal. Some recurrence in setting of acute PE. Now in NSR on amiodarone.  She is tolerating this well. Continue 100 mg daily. TSH and LFTs are up to date. Recommend long term anticoagulation with Eliquis. She is on lower dose due to advance age and renal insufficiency  2. Hypertension-controlled.  3.  Large hiatal hernia with history of  esophagitis and gastritis.  4. PE and right leg DVT. Now on coumadin. IVC filter in place. Decision was made to leave filter in place.  5. S/p right hip fracture with intramedullary nail placement.  I will follow up in 6 months.

## 2016-04-01 ENCOUNTER — Ambulatory Visit (INDEPENDENT_AMBULATORY_CARE_PROVIDER_SITE_OTHER): Payer: Medicare Other | Admitting: Cardiology

## 2016-04-01 ENCOUNTER — Encounter: Payer: Self-pay | Admitting: Cardiology

## 2016-04-01 VITALS — BP 144/62 | HR 80 | Ht 62.5 in | Wt 167.2 lb

## 2016-04-01 DIAGNOSIS — Z23 Encounter for immunization: Secondary | ICD-10-CM

## 2016-04-01 DIAGNOSIS — I480 Paroxysmal atrial fibrillation: Secondary | ICD-10-CM

## 2016-04-01 DIAGNOSIS — I2609 Other pulmonary embolism with acute cor pulmonale: Secondary | ICD-10-CM

## 2016-04-01 MED ORDER — APIXABAN 2.5 MG PO TABS: 2.5000 mg | ORAL_TABLET | Freq: Two times a day (BID) | ORAL | 3 refills | Status: DC

## 2016-04-01 NOTE — Addendum Note (Signed)
Addended by: Neoma LamingPUGH, CHERYL J on: 04/01/2016 11:38 AM   Modules accepted: Orders

## 2016-04-01 NOTE — Patient Instructions (Signed)
Continue your current medication  I will see you in 6 months.   

## 2016-05-04 ENCOUNTER — Other Ambulatory Visit: Payer: Self-pay | Admitting: Adult Health

## 2016-05-11 ENCOUNTER — Other Ambulatory Visit: Payer: Self-pay

## 2016-05-11 MED ORDER — AMIODARONE HCL 100 MG PO TABS: 100.0000 mg | ORAL_TABLET | Freq: Every day | ORAL | 1 refills | Status: DC

## 2016-05-12 ENCOUNTER — Other Ambulatory Visit: Payer: Self-pay | Admitting: *Deleted

## 2016-05-12 MED ORDER — AMIODARONE HCL 100 MG PO TABS: 100.0000 mg | ORAL_TABLET | Freq: Every day | ORAL | 1 refills | Status: AC

## 2016-07-14 ENCOUNTER — Telehealth: Payer: Self-pay | Admitting: Cardiology

## 2016-07-14 NOTE — Telephone Encounter (Signed)
New Message  Pts daughter voiced she would like for nurse to return her call.  Please f/u

## 2016-07-14 NOTE — Telephone Encounter (Signed)
Returned call to daughter (ok per DPR)-verbalized patient has had a couple episodes of bloody stools last night and one today.  Reports holding this AM dose of eliquis.  Reports patient has issues having BM, has to strain when going and takes colace to help.  Blood is bright red and reports twice it was a large amount.  Denies bleeding from other sites.  Spoke with Raquel our pharmacist-advised to hold dose tonight but if bleeding continues tomorrow proceed to ER for evaluation.    Daughter verbalized understanding.

## 2016-07-15 ENCOUNTER — Encounter (HOSPITAL_COMMUNITY): Payer: Self-pay

## 2016-07-15 ENCOUNTER — Telehealth: Payer: Self-pay | Admitting: *Deleted

## 2016-07-15 ENCOUNTER — Emergency Department (HOSPITAL_COMMUNITY)
Admission: EM | Admit: 2016-07-15 | Discharge: 2016-07-15 | Disposition: A | Discharge status: Home / Self Care | Payer: Medicare Other | Attending: Emergency Medicine | Admitting: Emergency Medicine

## 2016-07-15 DIAGNOSIS — K625 Hemorrhage of anus and rectum: Secondary | ICD-10-CM | POA: Insufficient documentation

## 2016-07-15 DIAGNOSIS — K644 Residual hemorrhoidal skin tags: Secondary | ICD-10-CM | POA: Diagnosis not present

## 2016-07-15 DIAGNOSIS — K649 Unspecified hemorrhoids: Secondary | ICD-10-CM

## 2016-07-15 DIAGNOSIS — Z7901 Long term (current) use of anticoagulants: Secondary | ICD-10-CM | POA: Insufficient documentation

## 2016-07-15 DIAGNOSIS — K922 Gastrointestinal hemorrhage, unspecified: Secondary | ICD-10-CM

## 2016-07-15 DIAGNOSIS — N189 Chronic kidney disease, unspecified: Secondary | ICD-10-CM | POA: Diagnosis not present

## 2016-07-15 DIAGNOSIS — E039 Hypothyroidism, unspecified: Secondary | ICD-10-CM | POA: Diagnosis not present

## 2016-07-15 LAB — COMPREHENSIVE METABOLIC PANEL
ALK PHOS: 67 U/L (ref 38–126)
ALT: 9 U/L — ABNORMAL LOW (ref 14–54)
ANION GAP: 7 (ref 5–15)
AST: 17 U/L (ref 15–41)
Albumin: 3.5 g/dL (ref 3.5–5.0)
BILIRUBIN TOTAL: 0.4 mg/dL (ref 0.3–1.2)
BUN: 16 mg/dL (ref 6–20)
CALCIUM: 8.5 mg/dL — AB (ref 8.9–10.3)
CO2: 28 mmol/L (ref 22–32)
Chloride: 104 mmol/L (ref 101–111)
Creatinine, Ser: 1.23 mg/dL — ABNORMAL HIGH (ref 0.44–1.00)
GFR calc non Af Amer: 37 mL/min — ABNORMAL LOW (ref >60)
GFR, EST AFRICAN AMERICAN: 43 mL/min — AB (ref >60)
Glucose, Bld: 123 mg/dL — ABNORMAL HIGH (ref 65–99)
POTASSIUM: 4.2 mmol/L (ref 3.5–5.1)
SODIUM: 139 mmol/L (ref 135–145)
Total Protein: 6.8 g/dL (ref 6.5–8.1)

## 2016-07-15 LAB — TYPE AND SCREEN
ABO/RH(D): O NEG
Antibody Screen: NEGATIVE

## 2016-07-15 LAB — CBC
HEMATOCRIT: 33.2 % — AB (ref 36.0–46.0)
HEMOGLOBIN: 10.3 g/dL — AB (ref 12.0–15.0)
MCH: 24.9 pg — ABNORMAL LOW (ref 26.0–34.0)
MCHC: 31 g/dL (ref 30.0–36.0)
MCV: 80.4 fL (ref 78.0–100.0)
Platelets: 243 10*3/uL (ref 150–400)
RBC: 4.13 MIL/uL (ref 3.87–5.11)
RDW: 18.6 % — ABNORMAL HIGH (ref 11.5–15.5)
WBC: 11.8 10*3/uL — ABNORMAL HIGH (ref 4.0–10.5)

## 2016-07-15 LAB — PROTIME-INR
INR: 0.93
PROTHROMBIN TIME: 12.5 s (ref 11.4–15.2)

## 2016-07-15 LAB — POC OCCULT BLOOD, ED: FECAL OCCULT BLD: NEGATIVE

## 2016-07-15 NOTE — Discharge Instructions (Signed)
Please return to the ER if the bleeding gets worse. Stop eliquis until Monday. If the bleeding stops - you can restart the eliquis on Monday.

## 2016-07-15 NOTE — ED Triage Notes (Signed)
Patient c/o bright red rectal bleeding x 3 days. Patient's daughter reports that the patient had 2 episodes of large amounts of rectal bleeding. Patient is currently on eliquis per cardiologist. Patient has an IVC filter. Patient also c/o hemorrhoidal pain

## 2016-07-15 NOTE — ED Notes (Signed)
ED Provider at bedside. 

## 2016-07-15 NOTE — Telephone Encounter (Signed)
Received call from daughter-states that mother held her Eliquis last PM as instructed and did not have any episode of bloody stool.  Daughter states patient decided to not take AM dose as well but has had 1 episode of bloody stool today, bright red, no clots noted.  State patient does not want to go to ER (as recommended) and was wondering what they should do.    Advised that if she holds Eliquis this is increasing her risk for stroke as well as PE and DVT (hx of), daughter verbalized understanding.  Spoke with Raquel again regarding current issue to confirm recommendations.    Advised to proceed to ER for evaluation.  Advised we are unable to advise her to continue to hold Eliquis as this would be putting patient at risk for stroke, PE, DVT but she needs to be evaluated since rectal bleeding has continued.  (Hx of GI bleeding).    Daughter aware of recommendations and verbalized understanding.

## 2016-07-16 NOTE — ED Provider Notes (Signed)
[facility]-EMERGENCY DEPT Provider Note   CSN: [id number] Arrival date & time: 07/15/16  1738     History   Chief Complaint Chief Complaint  Patient presents with  . Rectal Bleeding    HPI Carla [name] is a 83 y.o. female.  HPI Pt comes in with cc of bloody stools. Pt has hx of afib, on xarelto. She also has hx of diverticular dz and external hemorrhoids. Over the last 3 days, pt has had 2 out of 6 BM where her comod had blood. She doesn't think the stools had blood covering it. Pt's last BM had more blood than usual, so she came to the ER. Pt has no dizziness, weakness.  Past Medical History:  Diagnosis Date  . Atrial fibrillation (HCC)   . Cataracts, bilateral   . Chronic kidney disease    chronic kidney disease  . Cystocele   . Diverticulosis   . Dysrhythmia    A- fib  . Encounter for fitting and adjustment of pessary 04/23/2002  . Female bladder prolapse   . Gallstones   . Hemorrhoids   . Hiatal hernia   . High cholesterol   . Hyperkalemia   . Hyperlipidemia   . Hypothyroidism   . Iron deficiency anemia   . Leukocytosis   . Lordosis   . Osteoarthritis   . Osteoarthritis   . Osteoporosis   . Pneumonia    hx aspiration pneumonia  . Rectocele   . Renal insufficiency   . Scoliosis   . Urinary tract infection without hematuria     Patient Active Problem List   Diagnosis Date Noted  . Fracture, intertrochanteric, right femur (HCC) 12/13/2015  . Closed right femoral fracture (HCC) 12/12/2015  . Femur fracture, right (HCC) 12/12/2015  . Pressure ulcer 08/16/2015  . Pulmonary embolism (HCC) 08/15/2015  . Elevated troponin level 08/15/2015  . Elevated brain natriuretic peptide (BNP) level 08/15/2015  . Hypothyroid 02/27/2015  . 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 Acid reflux 02/21/2014  . Adult hypothyroidism 04/23/2013  . Abnormal TSH 01/09/2013  . Intractable nausea and vomiting 01/08/2013  . Elevated lipase 01/08/2013  . Hyperglycemia 01/08/2013  .  Arthritis 12/19/2012  . Presence of pessary 09/26/2012  . Incomplete bladder emptying 09/04/2012  . Urinary tract infection 08/23/2012  . Gram negative sepsis (HCC) 08/23/2012  . Intractable nausea and vomiting 08/19/2012  . Hypotension, unspecified 08/19/2012  . Dehydration 08/19/2012  . Atrial fibrillation (HCC) 08/19/2012  . Leukocytosis 08/19/2012  . Acute kidney injury (HCC) 08/19/2012  . Hypovolemic shock (HCC) 08/19/2012  . Iron deficiency anemia   . High cholesterol   . Osteoarthritis   . Female bladder prolapse   . Osteoporosis   . Hemorrhoids   . Diverticulosis   . Hiatal hernia   . Back ache 01/19/2012  . Scoliosis 01/19/2012  . Absolute anemia 09/30/2011  . HLD (hyperlipidemia) 09/23/2011    Past Surgical History:  Procedure Laterality Date  . CATARACT EXTRACTION    . CHOLECYSTECTOMY    . COLONOSCOPY    . ENDOMETRIAL BIOPSY  09/2009  . ESOPHAGOGASTRODUODENOSCOPY    . ESOPHAGOGASTRODUODENOSCOPY N/A 01/09/2013   Procedure: ESOPHAGOGASTRODUODENOSCOPY (EGD);  Surgeon: Vertell Novak., MD;  Location: [facility];  Service: Endoscopy;  Laterality: N/A;  . INTRAMEDULLARY (IM) NAIL INTERTROCHANTERIC Right 12/13/2015   Procedure: INTRAMEDULLARY (IM) NAIL INTERTROCHANTRIC;  Surgeon: Samson Frederic, MD;  Location: [facility];  Service: Orthopedics;  Laterality: Right;  . RECTAL ULTRASOUND N/A 04/10/2014   Procedure: RECTAL ULTRASOUND;  Surgeon:  Romie LeveeAlicia Thomas, MD;  [facility];  Service: Endoscopy;  Laterality: N/A;    OB History    Gravida Para Term Preterm AB Living   4 4       4    SAB TAB Ectopic Multiple Live Births                   Home Medications    Prior to Admission medications   Medication Sig Start Date End Date Taking? Authorizing Provider  acetaminophen (TYLENOL) 500 MG tablet Take 1,000 mg by mouth at bedtime.   Yes Historical Provider, MD  amiodarone (PACERONE) 100 MG tablet Take 1 tablet (100 mg total) by mouth daily. 05/12/16  Yes Peter M  [place], MD  apixaban (ELIQUIS) 2.5 MG TABS tablet Take 1 tablet (2.5 mg total) by mouth 2 (two) times daily. 04/01/16  Yes Peter M [place], MD  Calcium Carb-Cholecalciferol (CALCIUM-VITAMIN D) 600-400 MG-UNIT TABS Take 1 tablet by mouth 2 (two) times daily.    Yes Historical Provider, MD  docusate sodium (COLACE) 100 MG capsule Take 100 mg by mouth at bedtime.    Yes Historical Provider, MD  guaiFENesin (MUCINEX) 600 MG 12 hr tablet Take 600 mg by mouth 2 (two) times daily.   Yes Historical Provider, MD  levothyroxine (SYNTHROID, LEVOTHROID) 75 MCG tablet Take 75 mcg by mouth daily before breakfast.   Yes Historical Provider, MD  nitrofurantoin (MACRODANTIN) 50 MG capsule Take 50 mg by mouth daily.  12/27/14  Yes Historical Provider, MD  pantoprazole (PROTONIX) 40 MG tablet Take 40 mg by mouth daily. 01/10/13  Yes Dorothea OgleIskra M Myers, MD    Family History Family History  Problem Relation Age of Onset  . Diabetes Father   . Leukemia Father   . Breast cancer Paternal Aunt   . Breast cancer Paternal Aunt   . Cancer      several family members on dad's side of family  . Heart failure Brother     Pt cannot confirm this history    Social History Social History  Substance Use Topics  . Smoking status: Never Smoker  . Smokeless tobacco: Never Used  . Alcohol use No     Allergies   Bactrim [sulfamethoxazole-trimethoprim]; Ivp dye [iodinated diagnostic agents]; and Nsaids   Review of Systems Review of Systems  ROS 10 Systems reviewed and are negative for acute change except as noted in the HPI.     Physical Exam Updated Vital Signs BP 155/66   Pulse 65   Temp 97.9 F (36.6 C) (Oral)   Resp 19   Ht 5' 2.5" (1.588 m)   Wt 172 lb (78 kg)   SpO2 95%   BMI 30.96 kg/m   Physical Exam  Constitutional: She is oriented to person, place, and time. She appears well-developed and well-nourished.  HENT:  Head: Normocephalic and atraumatic.  Eyes: EOM are normal. Pupils are equal, round,  and reactive to light.  Neck: Neck supple.  Cardiovascular: Normal rate, regular rhythm and normal heart sounds.   No murmur heard. Pulmonary/Chest: Effort normal. No respiratory distress.  Abdominal: Soft. She exhibits no distension. There is no tenderness. There is no rebound and no guarding.  DRE reveals no melena, no BRBPR. PT has external hemorrhoids, no active bleeding  Neurological: She is alert and oriented to person, place, and time.  Skin: Skin is warm and dry.  Nursing note and vitals reviewed.    ED Treatments / Results  Labs (all labs ordered are  listed, but only abnormal results are displayed) Labs Reviewed  COMPREHENSIVE METABOLIC PANEL - Abnormal; Notable for the following:       Result Value   Glucose, Bld 123 (*)    Creatinine, Ser 1.23 (*)    Calcium 8.5 (*)    ALT 9 (*)    GFR calc non Af Amer 37 (*)    GFR calc Af Amer 43 (*)    All other components within normal limits  CBC - Abnormal; Notable for the following:    WBC 11.8 (*)    Hemoglobin 10.3 (*)    HCT 33.2 (*)    MCH 24.9 (*)    RDW 18.6 (*)    All other components within normal limits  PROTIME-INR  POC OCCULT BLOOD, ED  TYPE AND SCREEN    EKG  EKG Interpretation None       Radiology No results found.  Procedures Procedures (including critical care time)  Medications Ordered in ED Medications - No data to display   Initial Impression / Assessment and Plan / ED Course  I have reviewed the triage vital signs and the nursing notes.  Pertinent labs & imaging results that were available during my care of the patient were reviewed by me and considered in my medical decision making (see chart for details).  Clinical Course     DDx includes:  Diverticular bleed Colon cancer Rectal bleed Internal hemorrhoids External hemorrhoids  Pt on NOAC comes in with bloody stools. DRE revealed no melena, and is guaiac neg.  Hb is fine. BUN is normal.  This likely is a lower GI bleed.  We discussed admission vs. D/c - and pt preferred the latter. She is aware, that she is on NOAc, and if this is diverticular bleed, the bleed can get worse.  Strict ER return precautions have been discussed, and patient is agreeing with the plan and is comfortable with the workup done and the recommendations from the ER.  Pt also advised to stop NOAC for 3 days, and if the bleeding stops, she can restart NOAc again.   Final Clinical Impressions(s) / ED Diagnoses   Final diagnoses:  Lower GI bleed  Hemorrhoids, unspecified hemorrhoid type  Rectal bleeding    New Prescriptions Discharge Medication List as of 07/15/2016  9:44 PM       Derwood Kaplan, MD 07/16/16 [id number]

## 2016-08-05 ENCOUNTER — Ambulatory Visit (INDEPENDENT_AMBULATORY_CARE_PROVIDER_SITE_OTHER)
Admission: RE | Admit: 2016-08-05 | Discharge: 2016-08-05 | Disposition: A | Discharge status: Home / Self Care | Payer: Medicare Other | Source: Ambulatory Visit | Attending: Pulmonary Disease | Admitting: Pulmonary Disease

## 2016-08-05 DIAGNOSIS — R918 Other nonspecific abnormal finding of lung field: Secondary | ICD-10-CM | POA: Diagnosis not present

## 2016-08-13 ENCOUNTER — Encounter: Payer: Self-pay | Admitting: Pulmonary Disease

## 2016-08-13 ENCOUNTER — Ambulatory Visit (INDEPENDENT_AMBULATORY_CARE_PROVIDER_SITE_OTHER): Payer: Medicare Other | Admitting: Pulmonary Disease

## 2016-08-13 VITALS — BP 140/72 | HR 74 | Ht 62.5 in | Wt 173.0 lb

## 2016-08-13 DIAGNOSIS — R918 Other nonspecific abnormal finding of lung field: Secondary | ICD-10-CM | POA: Diagnosis not present

## 2016-08-13 DIAGNOSIS — J329 Chronic sinusitis, unspecified: Secondary | ICD-10-CM | POA: Diagnosis not present

## 2016-08-13 DIAGNOSIS — I2690 Septic pulmonary embolism without acute cor pulmonale: Secondary | ICD-10-CM

## 2016-08-13 NOTE — Progress Notes (Signed)
Subjective:    Patient ID: Carla [name], female    DOB: 11-Jun-1922, 83 y.o.   MRN: [id number]  C.C.:  Follow-up for Left Upper Lobe Nodules & Bilateral Pulmonary Emboli.  HPI  Since last appointment patient fell and broke her hip.  Left Upper Lobe Nodules: First noted on CT imaging in February 2017 that are primarily groundglass. May 2017 CT showed no sign of progression.  Bilateral Pulmonary Emboli: Patient has IVC filter in place. These were found on CT angiogram of the chest in February 2017 with a venous duplex revealing a right lower extremity mobile clot. IVC filter was placed and patient was treated with Coumadin. Patient's Coumadin was discontinued after 3 months of treatment: May 2017. Patient has had dyspnea but this seems to be improving. Patient has been restarted on Eliquis for systemic anticoagulation. Patient denies any bleeding, hematochezia, or melena.   Review of Systems Patient reports continued sinus pressure. Denies any sinus pain. Mild post-nasal drainage. No chest pain or pressure. No fever, chills, or sweats.   Allergies  Allergen Reactions  . Bactrim [Sulfamethoxazole-Trimethoprim] Other (See Comments)    Has chronic kidney disease.  This is not recommended by nephrologist.    . Ardine Bjork [Iodinated Diagnostic Agents]   . Nsaids Other (See Comments)    Has chronic kidney disease.  This is not recommended by nephrologist.    Current Outpatient Prescriptions on File Prior to Visit  Medication Sig Dispense Refill  . acetaminophen (TYLENOL) 500 MG tablet Take 650 mg by mouth as needed.     Marland Kitchen amiodarone (PACERONE) 100 MG tablet Take 1 tablet (100 mg total) by mouth daily. 45 tablet 1  . apixaban (ELIQUIS) 2.5 MG TABS tablet Take 1 tablet (2.5 mg total) by mouth 2 (two) times daily. 180 tablet 3  . Calcium Carb-Cholecalciferol (CALCIUM-VITAMIN D) 600-400 MG-UNIT TABS Take 1 tablet by mouth 2 (two) times daily.     Marland Kitchen docusate sodium (COLACE) 100 MG capsule Take  100 mg by mouth at bedtime.     Marland Kitchen guaiFENesin (MUCINEX) 600 MG 12 hr tablet Take 600 mg by mouth as needed.     Marland Kitchen levothyroxine (SYNTHROID, LEVOTHROID) 75 MCG tablet Take 75 mcg by mouth daily before breakfast.    . nitrofurantoin (MACRODANTIN) 50 MG capsule Take 50 mg by mouth daily.     . pantoprazole (PROTONIX) 40 MG tablet Take 40 mg by mouth daily.     No current facility-administered medications on file prior to visit.     Past Medical History:  Diagnosis Date  . Atrial fibrillation (HCC)   . Cataracts, bilateral   . Chronic kidney disease    chronic kidney disease  . Cystocele   . Diverticulosis   . Dysrhythmia    A- fib  . Encounter for fitting and adjustment of pessary 04/23/2002  . Female bladder prolapse   . Gallstones   . Hemorrhoids   . Hiatal hernia   . High cholesterol   . Hyperkalemia   . Hyperlipidemia   . Hypothyroidism   . Iron deficiency anemia   . Leukocytosis   . Lordosis   . Osteoarthritis   . Osteoarthritis   . Osteoporosis   . Pneumonia    hx aspiration pneumonia  . Rectocele   . Renal insufficiency   . Scoliosis   . Urinary tract infection without hematuria     Past Surgical History:  Procedure Laterality Date  . CATARACT EXTRACTION    . CHOLECYSTECTOMY    .  COLONOSCOPY    . ENDOMETRIAL BIOPSY  09/2009  . ESOPHAGOGASTRODUODENOSCOPY    . ESOPHAGOGASTRODUODENOSCOPY N/A 01/09/2013   Procedure: ESOPHAGOGASTRODUODENOSCOPY (EGD);  Surgeon: Vertell NovakJames L Edwards Jr., MD;  [facility];  Service: Endoscopy;  Laterality: N/A;  . INTRAMEDULLARY (IM) NAIL INTERTROCHANTERIC Right 12/13/2015   Procedure: INTRAMEDULLARY (IM) NAIL INTERTROCHANTRIC;  Surgeon: Samson FredericBrian Swinteck, MD;  Location: [facility];  Service: Orthopedics;  Laterality: Right;  . RECTAL ULTRASOUND N/A 04/10/2014   Procedure: RECTAL ULTRASOUND;  Surgeon: Romie LeveeAlicia Thomas, MD;  Location: [facility];  Service: Endoscopy;  Laterality: N/A;    Family History  Problem Relation Age of Onset  .  Diabetes Father   . Leukemia Father   . Breast cancer Paternal Aunt   . Breast cancer Paternal Aunt   . Cancer      several family members on dad's side of family  . Heart failure Brother     Pt cannot confirm this history    Social History   Social History  . Marital status: Widowed    Spouse name: N/A  . Number of children: 4  . Years of education: N/A   Occupational History  . Retired Chartered loss adjusterschoolteacher    Social History Main Topics  . Smoking status: Never Smoker  . Smokeless tobacco: Never Used  . Alcohol use No  . Drug use: No  . Sexual activity: No   Other Topics Concern  . None   Social History Narrative   Widowed.  Lives alone.  Uses a cane/walker to ambulate but independent of ADLs.      Objective:   Physical Exam BP 140/72 (BP Location: Left Arm, Patient Position: Sitting, Cuff Size: Normal)   Pulse 74   Ht 5' 2.5" (1.588 m)   Wt 173 lb (78.5 kg)   SpO2 96%   BMI 31.14 kg/m   General:  Awake. Elderly female. Sitting in wheelchair. Integument:  Warm & dry. No rash on exposed skin.  Extremities:  No cyanosis or clubbing.  HEENT:  Moist mucus membranes. No oral ulcers. No nasal turbinate swelling. No sinus tenderness to palpation. Cardiovascular:  Regular rate. Pitting lower extremity edema. No appreciable JVD given body positioning.  Pulmonary:  Clear with auscultation bilaterally. No accessory muscle use on room air. Speaking in complete sentences. Abdomen: Soft. Normal bowel sounds. Nondistended.  Musculoskeletal:  Normal bulk and tone. No joint deformity or effusion appreciated.  WALK TEST 12/09/15:  Walked 2 laps / Nadir Sat 98% on RA  IMAGING CT CHEST W/O 08/05/16 (personally reviewed by me): 1.1 cm irregular opacity left upper lobe appears to be consistent with scar formation. Peribronchial vascular left upper lobe groundglass nodules that are new. Calcified nodule in left lower lobe noted. No obvious developing mass. No pleural effusion or thickening.  No pericardial effusion. No pathologic mediastinal adenopathy. Hiata hernia noted with air-fluid level.  CT CHEST W/O 12/03/15 (previously reviewed by me): Glass opacity left upper lobe now measuring 1.1 x 0.7 cm. Groundglass nodule in anterior segment of left upper lobe measures 1.1 x 0.8 cm now. No new nodules or opacities. Calcified granuloma in left lung base unchanged. No pleural effusion or thickening. No pericardial effusion. No pathologic mediastinal adenopathy. Hiatal hernia noted.  CTA CHEST 08/15/15 (per radiologist): Consult pulmonary emboli arising from each Main pulmonary artery extending into upper & proximal lower lobe pulmonary arteries bilaterally. Embolism larger on the right than the left. RV/LV ratio 1.5. No thoracic aortic aneurysm or dissection. LVH noted. Calcified granuloma lateral left base.  Groundglass nodule anterior segment left upper lobe measuring 0.8 x 0.7 cm with similar groundglass nodule left upper lobe measuring 1.2 x 0.8 cm. Small sliding type hiatal hernia.  VENOUS DUPLEX (08/16/15): Acute DVT involving right common femoral vein with mobile thrombus, right popliteal vein, & right gastrocnemius veins. No left lower extremity DVT.  CARDIAC TTE (08/16/15): Mild focal basal septal hypertrophy. EF 50-55%. Normal regional wall motion. Grade 1 diastolic dysfunction. LA & RA normal in size. RV normal in size and function. Pulmonary artery systolic pressure 34 mmHg. Trivial aortic regurgitation without stenosis. Aortic root normal in size. Mild mitral regurgitation without stenosis. No pulmonic stenosis. Mild tricuspid regurgitation. No pericardial effusion.    Assessment & Plan:  83 y.o. female with known left upper lobe nodules and history of bilateral pulmonary emboli. Reviewed patient's CT scan with her today and family member present. Given the migratory nature of these nodules and the fact that they are primarily groundglass with decreasing size of left upper lobe linear  nodule which is likely scar tissue formation it's highly unlikely these represent a malignant process. We did discuss further monitoring with imaging but patient declines at this time. We also discussed her systemic anticoagulation and IVC filter. I cautioned the patient against further bleeding and the risks thereof on systemic anticoagulation. With her chronic rhinosinusitis I did refer her previously to ENT but in the setting of her hip fracture, hospitalization, and rehabilitation she was unable to keep this appointment. I instructed the patient contact my office if she had any new breathing problems or I could be of further assistance to her.  1. Left Upper Lobe Nodules: Patient declining further imaging. Likely inflammatory in nature. 2. Bilateral Pulmonary Emboli: Currently on systemic anticoagulation with Eliquis. IVC filter in place. Managed by hematology. 3. Chronic Rhinosinusitis: Referring to ENT for evaluation and management. 4. Health Maintenance:  S/P Influenza Vaccine September 2017 & Tdap November 2007. 5. Follow-up: Return to clinic as needed.   Donna Christen Jamison Neighbor, M.D. [facility] Pager:  [phone] After 3pm or if no response, call [phone] 12:01 PM 08/13/16

## 2016-08-13 NOTE — Patient Instructions (Signed)
   Call me if you have any new breathing problems or questions.  I will be happy to see you back as needed.

## 2016-08-30 ENCOUNTER — Telehealth: Payer: Self-pay | Admitting: Pharmacist Clinician (PhC)/ Clinical Pharmacy Specialist

## 2016-08-30 DIAGNOSIS — I482 Chronic atrial fibrillation, unspecified: Secondary | ICD-10-CM

## 2016-08-30 NOTE — Telephone Encounter (Signed)
Patient held Eliquis in January for "several days" after.  Restarted without problem, but about 4-5 days ago started bleeding again with hemorrhoids.  Wants to know what to do.  Also states patient is more pale and weak today.    Will have her go to PCP today or tomorrow am for CBC, advised that she go to ER if symptoms worsen overnight.  Also hold Eliquis for now.  Daughter voiced understanding.

## 2016-09-01 ENCOUNTER — Inpatient Hospital Stay (HOSPITAL_COMMUNITY)
Admission: EM | Admit: 2016-09-01 | Discharge: 2016-09-03 | DRG: 394 | Disposition: A | Discharge status: Home / Self Care | Payer: Medicare Other | Attending: Family Medicine | Admitting: Family Medicine

## 2016-09-01 ENCOUNTER — Encounter (HOSPITAL_COMMUNITY): Payer: Self-pay

## 2016-09-01 ENCOUNTER — Inpatient Hospital Stay (HOSPITAL_COMMUNITY): Payer: Medicare Other

## 2016-09-01 DIAGNOSIS — K5790 Diverticulosis of intestine, part unspecified, without perforation or abscess without bleeding: Secondary | ICD-10-CM | POA: Diagnosis present

## 2016-09-01 DIAGNOSIS — Z803 Family history of malignant neoplasm of breast: Secondary | ICD-10-CM | POA: Diagnosis not present

## 2016-09-01 DIAGNOSIS — K642 Third degree hemorrhoids: Principal | ICD-10-CM | POA: Diagnosis present

## 2016-09-01 DIAGNOSIS — K922 Gastrointestinal hemorrhage, unspecified: Secondary | ICD-10-CM | POA: Diagnosis not present

## 2016-09-01 DIAGNOSIS — I480 Paroxysmal atrial fibrillation: Secondary | ICD-10-CM | POA: Diagnosis not present

## 2016-09-01 DIAGNOSIS — N189 Chronic kidney disease, unspecified: Secondary | ICD-10-CM | POA: Diagnosis present

## 2016-09-01 DIAGNOSIS — I959 Hypotension, unspecified: Secondary | ICD-10-CM | POA: Diagnosis present

## 2016-09-01 DIAGNOSIS — Z7901 Long term (current) use of anticoagulants: Secondary | ICD-10-CM | POA: Diagnosis not present

## 2016-09-01 DIAGNOSIS — K2971 Gastritis, unspecified, with bleeding: Secondary | ICD-10-CM | POA: Diagnosis not present

## 2016-09-01 DIAGNOSIS — N820 Vesicovaginal fistula: Secondary | ICD-10-CM | POA: Diagnosis present

## 2016-09-01 DIAGNOSIS — K649 Unspecified hemorrhoids: Secondary | ICD-10-CM | POA: Diagnosis not present

## 2016-09-01 DIAGNOSIS — R002 Palpitations: Secondary | ICD-10-CM | POA: Diagnosis not present

## 2016-09-01 DIAGNOSIS — I129 Hypertensive chronic kidney disease with stage 1 through stage 4 chronic kidney disease, or unspecified chronic kidney disease: Secondary | ICD-10-CM | POA: Diagnosis present

## 2016-09-01 DIAGNOSIS — Z806 Family history of leukemia: Secondary | ICD-10-CM

## 2016-09-01 DIAGNOSIS — E785 Hyperlipidemia, unspecified: Secondary | ICD-10-CM | POA: Diagnosis present

## 2016-09-01 DIAGNOSIS — N814 Uterovaginal prolapse, unspecified: Secondary | ICD-10-CM | POA: Diagnosis present

## 2016-09-01 DIAGNOSIS — D72829 Elevated white blood cell count, unspecified: Secondary | ICD-10-CM | POA: Diagnosis present

## 2016-09-01 DIAGNOSIS — Z86711 Personal history of pulmonary embolism: Secondary | ICD-10-CM

## 2016-09-01 DIAGNOSIS — Z792 Long term (current) use of antibiotics: Secondary | ICD-10-CM | POA: Diagnosis not present

## 2016-09-01 DIAGNOSIS — K5900 Constipation, unspecified: Secondary | ICD-10-CM | POA: Diagnosis present

## 2016-09-01 DIAGNOSIS — I272 Pulmonary hypertension, unspecified: Secondary | ICD-10-CM | POA: Diagnosis present

## 2016-09-01 DIAGNOSIS — K219 Gastro-esophageal reflux disease without esophagitis: Secondary | ICD-10-CM | POA: Diagnosis present

## 2016-09-01 DIAGNOSIS — D62 Acute posthemorrhagic anemia: Secondary | ICD-10-CM | POA: Diagnosis present

## 2016-09-01 DIAGNOSIS — R778 Other specified abnormalities of plasma proteins: Secondary | ICD-10-CM | POA: Diagnosis present

## 2016-09-01 DIAGNOSIS — E039 Hypothyroidism, unspecified: Secondary | ICD-10-CM | POA: Diagnosis present

## 2016-09-01 DIAGNOSIS — R0602 Shortness of breath: Secondary | ICD-10-CM

## 2016-09-01 DIAGNOSIS — D509 Iron deficiency anemia, unspecified: Secondary | ICD-10-CM | POA: Diagnosis present

## 2016-09-01 DIAGNOSIS — E7800 Pure hypercholesterolemia, unspecified: Secondary | ICD-10-CM | POA: Diagnosis present

## 2016-09-01 DIAGNOSIS — D500 Iron deficiency anemia secondary to blood loss (chronic): Secondary | ICD-10-CM | POA: Diagnosis not present

## 2016-09-01 DIAGNOSIS — Z86718 Personal history of other venous thrombosis and embolism: Secondary | ICD-10-CM

## 2016-09-01 DIAGNOSIS — I4891 Unspecified atrial fibrillation: Secondary | ICD-10-CM | POA: Diagnosis present

## 2016-09-01 DIAGNOSIS — M810 Age-related osteoporosis without current pathological fracture: Secondary | ICD-10-CM | POA: Diagnosis present

## 2016-09-01 DIAGNOSIS — Z87440 Personal history of urinary (tract) infections: Secondary | ICD-10-CM

## 2016-09-01 DIAGNOSIS — Z833 Family history of diabetes mellitus: Secondary | ICD-10-CM

## 2016-09-01 LAB — BASIC METABOLIC PANEL
Anion gap: 7 (ref 5–15)
BUN: 23 mg/dL — AB (ref 6–20)
CHLORIDE: 104 mmol/L (ref 101–111)
CO2: 26 mmol/L (ref 22–32)
CREATININE: 1.53 mg/dL — AB (ref 0.44–1.00)
Calcium: 8.3 mg/dL — ABNORMAL LOW (ref 8.9–10.3)
GFR calc Af Amer: 33 mL/min — ABNORMAL LOW (ref >60)
GFR calc non Af Amer: 28 mL/min — ABNORMAL LOW (ref >60)
Glucose, Bld: 115 mg/dL — ABNORMAL HIGH (ref 65–99)
Potassium: 4.3 mmol/L (ref 3.5–5.1)
Sodium: 137 mmol/L (ref 135–145)

## 2016-09-01 LAB — MAGNESIUM: Magnesium: 1.9 mg/dL (ref 1.7–2.4)

## 2016-09-01 LAB — PROTIME-INR
INR: 1.15
PROTHROMBIN TIME: 14.7 s (ref 11.4–15.2)

## 2016-09-01 LAB — CBC WITH DIFFERENTIAL/PLATELET
BASOS PCT: 0 %
Basophils Absolute: 0 10*3/uL (ref 0.0–0.1)
EOS ABS: 0 10*3/uL (ref 0.0–0.7)
Eosinophils Relative: 0 %
HEMATOCRIT: 19.6 % — AB (ref 36.0–46.0)
HEMOGLOBIN: 6 g/dL — AB (ref 12.0–15.0)
LYMPHS PCT: 8 %
Lymphs Abs: 1.3 10*3/uL (ref 0.7–4.0)
MCH: 22.8 pg — AB (ref 26.0–34.0)
MCHC: 30.6 g/dL (ref 30.0–36.0)
MCV: 74.5 fL — ABNORMAL LOW (ref 78.0–100.0)
Monocytes Absolute: 2.4 10*3/uL — ABNORMAL HIGH (ref 0.1–1.0)
Monocytes Relative: 15 %
NEUTROS ABS: 12.6 10*3/uL — AB (ref 1.7–7.7)
Neutrophils Relative %: 77 %
Platelets: 376 10*3/uL (ref 150–400)
RBC: 2.63 MIL/uL — ABNORMAL LOW (ref 3.87–5.11)
RDW: 16.6 % — ABNORMAL HIGH (ref 11.5–15.5)
WBC: 16.3 10*3/uL — ABNORMAL HIGH (ref 4.0–10.5)

## 2016-09-01 LAB — IRON AND TIBC
Iron: <5 ug/dL — ABNORMAL LOW (ref 28–170)
TIBC: 330 ug/dL (ref 250–450)

## 2016-09-01 LAB — TROPONIN I
TROPONIN I: 0.04 ng/mL — AB (ref <0.03)
Troponin I: 0.04 ng/mL (ref <0.03)

## 2016-09-01 LAB — RETICULOCYTES
RBC.: 2.63 MIL/uL — ABNORMAL LOW (ref 3.87–5.11)
RETIC CT PCT: 3.2 % — AB (ref 0.4–3.1)
Retic Count, Absolute: 84.2 10*3/uL (ref 19.0–186.0)

## 2016-09-01 LAB — POC OCCULT BLOOD, ED: FECAL OCCULT BLD: POSITIVE — AB

## 2016-09-01 LAB — T4, FREE: Free T4: 1.47 ng/dL — ABNORMAL HIGH (ref 0.61–1.12)

## 2016-09-01 LAB — APTT: APTT: 30 s (ref 24–36)

## 2016-09-01 LAB — VITAMIN B12: VITAMIN B 12: 198 pg/mL (ref 180–914)

## 2016-09-01 LAB — FOLATE: FOLATE: 27.3 ng/mL (ref >5.9)

## 2016-09-01 LAB — FERRITIN: Ferritin: 5 ng/mL — ABNORMAL LOW (ref 11–307)

## 2016-09-01 LAB — TSH: TSH: 2.005 u[IU]/mL (ref 0.350–4.500)

## 2016-09-01 LAB — PREPARE RBC (CROSSMATCH)

## 2016-09-01 MED ORDER — LEVALBUTEROL HCL 0.63 MG/3ML IN NEBU: 0.6300 mg | INHALATION_SOLUTION | Freq: Four times a day (QID) | RESPIRATORY_TRACT | Status: DC | PRN

## 2016-09-01 MED ORDER — DOCUSATE SODIUM 100 MG PO CAPS
100.0000 mg | ORAL_CAPSULE | Freq: Every day | ORAL | Status: DC
  Administered 2016-09-02: 100 mg via ORAL
  Filled 2016-09-01: qty 1

## 2016-09-01 MED ORDER — SODIUM CHLORIDE 0.9 % IV SOLN
10.0000 mL/h | Freq: Once | INTRAVENOUS | Status: AC
  Administered 2016-09-01: 10 mL/h via INTRAVENOUS

## 2016-09-01 MED ORDER — PRAMOXINE HCL 1 % RE FOAM
Freq: Two times a day (BID) | RECTAL | Status: DC
  Administered 2016-09-02: 1 via RECTAL
  Administered 2016-09-02 – 2016-09-03 (×2): via RECTAL
  Filled 2016-09-01: qty 15

## 2016-09-01 MED ORDER — ONDANSETRON HCL 4 MG PO TABS: 4.0000 mg | ORAL_TABLET | Freq: Four times a day (QID) | ORAL | Status: DC | PRN

## 2016-09-01 MED ORDER — AMIODARONE HCL 100 MG PO TABS
100.0000 mg | ORAL_TABLET | Freq: Every day | ORAL | Status: DC
  Administered 2016-09-01 – 2016-09-03 (×3): 100 mg via ORAL
  Filled 2016-09-01 (×3): qty 1

## 2016-09-01 MED ORDER — ACETAMINOPHEN 650 MG RE SUPP: 650.0000 mg | Freq: Four times a day (QID) | RECTAL | Status: DC | PRN

## 2016-09-01 MED ORDER — SODIUM CHLORIDE 0.9% FLUSH
3.0000 mL | Freq: Two times a day (BID) | INTRAVENOUS | Status: DC
  Administered 2016-09-01 – 2016-09-02 (×3): 3 mL via INTRAVENOUS

## 2016-09-01 MED ORDER — LEVOTHYROXINE SODIUM 50 MCG PO TABS
75.0000 ug | ORAL_TABLET | Freq: Every day | ORAL | Status: DC
  Administered 2016-09-02 – 2016-09-03 (×2): 75 ug via ORAL
  Filled 2016-09-01 (×3): qty 1

## 2016-09-01 MED ORDER — ACETAMINOPHEN 325 MG PO TABS: 650.0000 mg | ORAL_TABLET | Freq: Four times a day (QID) | ORAL | Status: DC | PRN

## 2016-09-01 MED ORDER — POLYVINYL ALCOHOL 1.4 % OP SOLN
1.0000 [drp] | Freq: Four times a day (QID) | OPHTHALMIC | Status: DC | PRN
  Administered 2016-09-01: 1 [drp] via OPHTHALMIC
  Filled 2016-09-01: qty 15

## 2016-09-01 MED ORDER — PANTOPRAZOLE SODIUM 40 MG PO TBEC
40.0000 mg | DELAYED_RELEASE_TABLET | Freq: Every day | ORAL | Status: DC
  Administered 2016-09-02 – 2016-09-03 (×2): 40 mg via ORAL
  Filled 2016-09-01 (×2): qty 1

## 2016-09-01 MED ORDER — ONDANSETRON HCL 4 MG/2ML IJ SOLN: 4.0000 mg | Freq: Four times a day (QID) | INTRAMUSCULAR | Status: DC | PRN

## 2016-09-01 MED ORDER — PHENYLEPH-SHARK LIV OIL-MO-PET 0.25-3-14-71.9 % RE OINT
1.0000 "application " | TOPICAL_OINTMENT | Freq: Two times a day (BID) | RECTAL | Status: DC | PRN
  Filled 2016-09-01: qty 28.4

## 2016-09-01 NOTE — H&P (Addendum)
[facility] History and Physical  Carla [name] ZOX:[id number] DOB: [birth year] DOA: 09/01/2016  Referring physician:   PCP: [name], MD   Chief Complaint: Rectal bleeding  HPI:  83 year old female with a history of paroxysmal atrial fibrillation in the setting of urosepsis, probably hypertension, history of GI bleeding, hypothyroidism, right leg DVT/bilateral PE in February 2017, status post IVC filter placement, presents to the ER today because of a hemoglobin of 6.0 and shortness of breath which has been worsening for the last couple of weeks. He has experienced rectal bleeding on and off since June 2017.  Patient was previously on Coumadin for 3 months. Coumadin was discontinued in May 2017. Patient restarted on systemic anticoagulation with  Eliquis upon recommendations from cardiology, and patient has had intermittent rectal bleeding which she attributes to hemorrhoids.  Patient was in the ER on 07/15/16 with complaints of rectal bleeding for 3 days. She had 2 episodes of large amounts of rectal bleeding, patient requested to be discharged, with the understanding that she will return if she had further bleeding. Patient in the meantime saw her primary care provider,   Hemoglobin was found to be 6.4. Patient was advised to go back to the ER and hold Eliquis. Repeat hemoglobin today 6.0. EDP has ordered 2 units of packed red blood cells. Patient has had an EGD in 2014 that showed ulcerative esophagitis and massive hiatal hernia. She has not had a recent colonoscopy. ED course-  BP (!) 125/49   Pulse 70   Temp 97.5 F (36.4 C) (Oral)   Resp 21 ,SpO2 93%   BMI 31.14 kg/m  Patient is accompanied by her daughter who is requesting to be evaluated by cardiology to see long-term anticoagulation as needed. She states that patient would like to defer colonoscopy, and she will be unable to prep adequately due to a hiatal hernia.      Review of Systems: negative for the  following  Constitutional: Denies fever, chills, diaphoresis, appetite change and fatigue.  HEENT: Denies photophobia, eye pain, redness, hearing loss, ear pain, congestion, sore throat, rhinorrhea, sneezing, mouth sores, trouble swallowing, neck pain, neck stiffness and tinnitus.  Respiratory: Positive for SOB, DOE, cough, chest tightness, and wheezing.  Cardiovascular: Denies chest pain, palpitations and leg swelling.  Gastrointestinal: Denies nausea, vomiting, abdominal pain, diarrhea, constipation, positive for blood in stool and abdominal distention.  Genitourinary: Denies dysuria, urgency, frequency, hematuria, flank pain and difficulty urinating.  Musculoskeletal: Denies myalgias, back pain, joint swelling, arthralgias and gait problem.  Skin: Denies pallor, rash and wound.  Neurological: Denies dizziness, seizures, syncope, weakness, light-headedness, numbness and headaches.  Hematological: Denies adenopathy. Easy bruising, personal or family bleeding history  Psychiatric/Behavioral: Denies suicidal ideation, mood changes, confusion, nervousness, sleep disturbance and agitation       Past Medical History:  Diagnosis Date  . Atrial fibrillation (HCC)   . Cataracts, bilateral   . Chronic kidney disease    chronic kidney disease  . Cystocele   . Diverticulosis   . Dysrhythmia    A- fib  . Encounter for fitting and adjustment of pessary 04/23/2002  . Female bladder prolapse   . Gallstones   . Hemorrhoids   . Hiatal hernia   . High cholesterol   . Hyperkalemia   . Hyperlipidemia   . Hypothyroidism   . Iron deficiency anemia   . Leukocytosis   . Lordosis   . Osteoarthritis   . Osteoarthritis   . Osteoporosis   . Pneumonia  hx aspiration pneumonia  . Rectocele   . Renal insufficiency   . Scoliosis   . Urinary tract infection without hematuria      Past Surgical History:  Procedure Laterality Date  . CATARACT EXTRACTION    . CHOLECYSTECTOMY    . COLONOSCOPY     . ENDOMETRIAL BIOPSY  09/2009  . ESOPHAGOGASTRODUODENOSCOPY    . ESOPHAGOGASTRODUODENOSCOPY N/A 01/09/2013   Procedure: ESOPHAGOGASTRODUODENOSCOPY (EGD);  Surgeon: Vertell Novak., MD;  Location: [facility];  Service: Endoscopy;  Laterality: N/A;  . INTRAMEDULLARY (IM) NAIL INTERTROCHANTERIC Right 12/13/2015   Procedure: INTRAMEDULLARY (IM) NAIL INTERTROCHANTRIC;  Surgeon: Samson Frederic, MD;  Location: [facility];  Service: Orthopedics;  Laterality: Right;  . RECTAL ULTRASOUND N/A 04/10/2014   Procedure: RECTAL ULTRASOUND;  Surgeon: Romie Levee, MD;  Location: [facility];  Service: Endoscopy;  Laterality: N/A;      Social History:  reports that she has never smoked. She has never used smokeless tobacco. She reports that she does not drink alcohol or use drugs.    Allergies  Allergen Reactions  . Bactrim [Sulfamethoxazole-Trimethoprim] Other (See Comments)    Has chronic kidney disease.  This is not recommended by nephrologist.    . Contrast Media [Iodinated Diagnostic Agents] Other (See Comments)    Affected her labs for her kidneys   . Nsaids Other (See Comments)    Has chronic kidney disease.  This is not recommended by nephrologist.    Family History  Problem Relation Age of Onset  . Diabetes Father   . Leukemia Father   . Breast cancer Paternal Aunt   . Breast cancer Paternal Aunt   . Cancer      several family members on dad's side of family  . Heart failure Brother     Pt cannot confirm this history         Prior to Admission medications   Medication Sig Start Date End Date Taking? Authorizing Provider  acetaminophen (TYLENOL) 650 MG CR tablet Take 650 mg by mouth every 8 (eight) hours as needed for pain.   Yes Historical Provider, MD  amiodarone (PACERONE) 100 MG tablet Take 1 tablet (100 mg total) by mouth daily. 05/12/16  Yes Peter M [place], MD  apixaban (ELIQUIS) 2.5 MG TABS tablet Take 1 tablet (2.5 mg total) by mouth 2 (two) times daily. 04/01/16  Yes Peter M  [place], MD  Calcium Carb-Cholecalciferol (CALCIUM-VITAMIN D) 600-400 MG-UNIT TABS Take 1 tablet by mouth 2 (two) times daily.    Yes Historical Provider, MD  docusate sodium (COLACE) 100 MG capsule Take 100 mg by mouth at bedtime.    Yes Historical Provider, MD  guaiFENesin (MUCINEX) 600 MG 12 hr tablet Take 600 mg by mouth 2 (two) times daily as needed for cough.    Yes Historical Provider, MD  hydroxypropyl methylcellulose / hypromellose (ISOPTO TEARS / GONIOVISC) 2.5 % ophthalmic solution Place 1 drop into both eyes 4 (four) times daily as needed for dry eyes.   Yes Historical Provider, MD  ipratropium (ATROVENT) 0.06 % nasal spray Place 2 sprays into the nose 2 (two) times daily. 08/30/16  Yes Historical Provider, MD  levothyroxine (SYNTHROID, LEVOTHROID) 75 MCG tablet Take 75 mcg by mouth daily before breakfast.   Yes Historical Provider, MD  nitrofurantoin (MACRODANTIN) 50 MG capsule Take 50 mg by mouth daily with breakfast.  12/27/14  Yes Historical Provider, MD  pantoprazole (PROTONIX) 40 MG tablet Take 40 mg by mouth daily with breakfast.  01/10/13  Yes  Dorothea Ogle, MD  phenylephrine-shark liver oil-mineral oil-petrolatum (PREPARATION H) 0.25-3-14-71.9 % rectal ointment Place 1 application rectally 2 (two) times daily as needed for hemorrhoids.   Yes Historical Provider, MD  polyethylene glycol (MIRALAX / GLYCOLAX) packet Take by mouth daily as needed for mild constipation or moderate constipation.   Yes Historical Provider, MD  witch hazel-glycerin (TUCKS) pad Apply 1 application topically as needed for hemorrhoids.   Yes Historical Provider, MD     Physical Exam: Vitals:   09/01/16 0943 09/01/16 0944 09/01/16 1133 09/01/16 1300  BP: (!) 150/50  (!) 125/49 (!) 130/47  Pulse: 64  70 71  Resp: 16  21 21   Temp: 97.5 F (36.4 C)     TempSrc: Oral     SpO2: 95%  93% 94%  Weight:  78.5 kg (173 lb)    Height:  5' 2.5" (1.588 m)        Constitutional: NAD, calm, comfortable Vitals:    09/01/16 0943 09/01/16 0944 09/01/16 1133 09/01/16 1300  BP: (!) 150/50  (!) 125/49 (!) 130/47  Pulse: 64  70 71  Resp: 16  21 21   Temp: 97.5 F (36.4 C)     TempSrc: Oral     SpO2: 95%  93% 94%  Weight:  78.5 kg (173 lb)    Height:  5' 2.5" (1.588 m)     Eyes: PERRL, lids and conjunctivae normal ENMT: Mucous membranes are moist. Posterior pharynx clear of any exudate or lesions.Normal dentition.  Neck: normal, supple, no masses, no thyromegaly Respiratory: clear to auscultation bilaterally, no wheezing, no crackles. Normal respiratory effort. No accessory muscle use.  Cardiovascular: Regular rate and rhythm, no murmurs / rubs / gallops. No extremity edema. 2+ pedal pulses. No carotid bruits.  Abdomen: no tenderness, no masses palpated. No hepatosplenomegaly. Bowel sounds positive.  Musculoskeletal: no clubbing / cyanosis. No joint deformity upper and lower extremities. Good ROM, no contractures. Normal muscle tone.  Skin: no rashes, lesions, ulcers. No induration Neurologic: CN 2-12 grossly intact. Sensation intact, DTR normal. Strength 5/5 in all 4.  Psychiatric: Normal judgment and insight. Alert and oriented x 3. Normal mood.     Labs on Admission: I have personally reviewed following labs and imaging studies  CBC:  Recent Labs Lab 09/01/16 1035  WBC 16.3*  NEUTROABS 12.6*  HGB 6.0*  HCT 19.6*  MCV 74.5*  PLT 376    Basic Metabolic Panel:  Recent Labs Lab 09/01/16 1035  NA 137  K 4.3  CL 104  CO2 26  GLUCOSE 115*  BUN 23*  CREATININE 1.53*  CALCIUM 8.3*    GFR: Estimated Creatinine Clearance: 23 mL/min (by C-G formula based on SCr of 1.53 mg/dL (H)).  Liver Function Tests: No results for input(s): AST, ALT, ALKPHOS, BILITOT, PROT, ALBUMIN in the last 168 hours. No results for input(s): LIPASE, AMYLASE in the last 168 hours. No results for input(s): AMMONIA in the last 168 hours.  Coagulation Profile:  Recent Labs Lab 09/01/16 1035  INR 1.15   No  results for input(s): DDIMER in the last 72 hours.  Cardiac Enzymes: No results for input(s): CKTOTAL, CKMB, CKMBINDEX, TROPONINI in the last 168 hours.  BNP (last 3 results) No results for input(s): PROBNP in the last 8760 hours.  HbA1C: No results for input(s): HGBA1C in the last 72 hours. Lab Results  Component Value Date   HGBA1C 6.1 (H) 01/08/2013     CBG: No results for input(s): GLUCAP in the last 168  hours.  Lipid Profile: No results for input(s): CHOL, HDL, LDLCALC, TRIG, CHOLHDL, LDLDIRECT in the last 72 hours.  Thyroid Function Tests: No results for input(s): TSH, T4TOTAL, FREET4, T3FREE, THYROIDAB in the last 72 hours.  Anemia Panel:  Recent Labs  09/01/16 1035  RETICCTPCT 3.2*    Urine analysis:    Component Value Date/Time   COLORURINE YELLOW 12/16/2015 1135   APPEARANCEUR CLEAR 12/16/2015 1135   LABSPEC 1.017 12/16/2015 1135   PHURINE 5.5 12/16/2015 1135   GLUCOSEU NEGATIVE 12/16/2015 1135   HGBUR TRACE (A) 12/16/2015 1135   BILIRUBINUR NEGATIVE 12/16/2015 1135   KETONESUR NEGATIVE 12/16/2015 1135   PROTEINUR 30 (A) 12/16/2015 1135   UROBILINOGEN 0.2 01/08/2013 0759   NITRITE POSITIVE (A) 12/16/2015 1135   LEUKOCYTESUR SMALL (A) 12/16/2015 1135    Sepsis Labs: @LABRCNTIP (procalcitonin:4,lacticidven:4) )No results found for this or any previous visit (from the past 240 hour(s)).       Radiological Exams on Admission: No results found. Ct Chest Wo Contrast  Result Date: 08/05/2016 CLINICAL DATA:  Follow-up for pulmonary nodules. EXAM: CT CHEST WITHOUT CONTRAST TECHNIQUE: Multidetector CT imaging of the chest was performed following the standard protocol without IV contrast. COMPARISON:  Prior chest CTs,  12/03/2015, 08/15/2015. FINDINGS: Cardiovascular: Heart is normal in size and configuration. There are stents coronary artery calcifications. The great vessels normal in caliber. Atherosclerotic calcifications are noted along the thoracic  aorta and at the origin of the innominate and left subclavian arteries. Mediastinum/Nodes: Moderate size hiatal hernia. No mediastinal or hilar masses. No enlarged lymph nodes. No neck base or axillary masses or adenopathy. Trachea is unremarkable. Lungs/Pleura: Focal irregular opacity in the left apex measuring 11 x 7 mm in greatest transverse dimension, is stable, consistent with scarring. New area of peribronchovascular ground-glass opacity is noted in the left upper lobe measuring 22 x 19 x 10 mm. Subtle area of ground-glass opacity noted in the left upper lobe peripherally on the most recent prior study has resolved. There is a small area ground-glass opacity in the anteromedial left upper lobe, centered on image 70, series 3, measuring 12 x 11 mm stable calcified granuloma in the left lower lobe at the lateral costophrenic sulcus. No right sided nodules lower focal areas of opacity. No evidence pulmonary edema.  No pleural effusion.  No pneumothorax. Upper Abdomen: No acute abnormality. Musculoskeletal: No fracture or acute finding. No osteoblastic or osteolytic lesions. IMPRESSION: 1. There are 2 new areas of ground-glass opacity in the left upper lobe. These are most likely inflammatory in etiology. 2. Small area of ground-glass opacity seen in the peripheral left upper lobe on the most recent prior CT, which was centered on image 56, series 3, is no longer present. 3. Focal irregular opacity in the left upper lobe the apex, which is most consistent scarring, has been stable since the prior 2 exams. Given the findings of new ground-glass opacities in the left upper lobe, additional short-term follow-up is recommended. Non-contrast chest CT at 3-6 months is recommended. If nodules persist, subsequent management will be based upon the most suspicious nodule(s). This recommendation follows the consensus statement: Guidelines for Management of Incidental Pulmonary Nodules Detected on CT Images: From the  Fleischner Society 2017; Radiology 2017; 284:228-243. Electronically Signed   By: Amie Portlandavid  Ormond M.D.   On: 08/05/2016 12:26      EKG: Independently reviewed.    Assessment/Plan Principal Problem:   GI bleed Diverticular versus hemorrhoidal Dr. Bosie ClosSchooler from GI has been consulted He will make  further recommendations Until then the patient will be placed only on a clear liquid diet Transfuse for hemoglobin greater than 8.0 Patient is receiving 2 units of packed red blood cells currently  Paroxysmal Atrial fibrillation -CHADSVasc score of 4 Currently on eliquis , which has been held upon admission -Continue amiodarone 100 mg by mouth daily  Shortness of breath Suspect this is secondary to underlying acute on chronic anemia We'll cycle cardiac enzymes, maintain on telemetry 2-D echo to ensure EF is preserved, last 2-D echo 08/16/15, EF 50-55%    Leukocytosis. Could be reactive from the GI bleeding, will obtain chest x-ray  UA   History of bilateral pulmonary emboli -Had been anticoagulated with warfarin which was discontinued after 3 months of therapy by her hematologist at [facility], subsequently placed on eliquis  Also has an IVC filter in place Cardiology requested to make further recommendations about long-term anticoagulation in the setting of paroxysmal atrial fibrillation of lifelong anticoagulation was recommended by Dr. Peter [place] in 9/17   Hypothyroidism.  -Continue thyroid replacement therapy Check TSH and free T4        Iron deficiency anemia-likely secondary to chronic, intermittent rectal bleeding Continue PPI, anemia panel      HLD (hyperlipidemia)      DVT prophylaxis: SCDs     consults called: Cardiology to see patient in the morning, GI  Family Communication: Admission, patients condition and plan of care including tests being ordered have been discussed with the patient  who indicates understanding and agree with the plan and Code  Status  Admission status: Inpatient  Disposition plan: Further plan will depend as patient's clinical course evolves and further radiologic and laboratory data become available. Likely home when stable   At the time of admission, it appears that the appropriate admission status for this patient is INPATIENT .Thisis judged to be reasonable and necessary in order to provide the required intensity of service to ensure the patient's safetygiven thepresenting symptoms, physical exam findings, and initial radiographic and laboratory data in the context of their chronic comorbidities.   Richarda Overlie MD [facility] Pager [phone]  If 7PM-7AM, please contact night-coverage [email or web address] Password [email or web address]  09/01/2016, 1:09 PM

## 2016-09-01 NOTE — ED Notes (Signed)
CONSENT SIGNED FOR BLOOD

## 2016-09-01 NOTE — Consult Note (Signed)
Referring Provider: Dr. Susie Cassette Primary Care Physician:  [name], MD Primary Gastroenterologist:  Dr. Randa Evens  Reason for Consultation:  Rectal bleeding  HPI: Carla [name] is a 83 y.o. female with multiple medical problems as stated below who is on Eliquis for Afib and has been having recurrent rectal bleeding described as bright red blood per rectum on an intermittent basis that she attributes to her hemorrhoids. Having rectal itching at times. Hgb 6.0 (10.3 on 07/15/16). Hemodynamically stable. Denies abdominal pain, dizziness, dysphagia, or heartburn. Denies melena, N/V/hematemesis. EGD in 2014 showed ulcerative esophagitis and a very large hiatal hernia. Last colonoscopy in 9/11 that showed diffuse diverticulosis. She has not used any ointments for hemorrhoids recently. Daughter at bedside. Nurse tech present during my examination.  Past Medical History:  Diagnosis Date  . Atrial fibrillation (HCC)   . Cataracts, bilateral   . Chronic kidney disease    chronic kidney disease  . Cystocele   . Diverticulosis   . Dysrhythmia    A- fib  . Encounter for fitting and adjustment of pessary 04/23/2002  . Female bladder prolapse   . Gallstones   . Hemorrhoids   . Hiatal hernia   . High cholesterol   . Hyperkalemia   . Hyperlipidemia   . Hypothyroidism   . Iron deficiency anemia   . Leukocytosis   . Lordosis   . Osteoarthritis   . Osteoarthritis   . Osteoporosis   . Pneumonia    hx aspiration pneumonia  . Rectocele   . Renal insufficiency   . Scoliosis   . Urinary tract infection without hematuria     Past Surgical History:  Procedure Laterality Date  . CATARACT EXTRACTION    . CHOLECYSTECTOMY    . COLONOSCOPY    . ENDOMETRIAL BIOPSY  09/2009  . ESOPHAGOGASTRODUODENOSCOPY    . ESOPHAGOGASTRODUODENOSCOPY N/A 01/09/2013   Procedure: ESOPHAGOGASTRODUODENOSCOPY (EGD);  Surgeon: Vertell Novak., MD;  Location: [facility];  Service: Endoscopy;  Laterality: N/A;  .  INTRAMEDULLARY (IM) NAIL INTERTROCHANTERIC Right 12/13/2015   Procedure: INTRAMEDULLARY (IM) NAIL INTERTROCHANTRIC;  Surgeon: Samson Frederic, MD;  Location: [facility];  Service: Orthopedics;  Laterality: Right;  . RECTAL ULTRASOUND N/A 04/10/2014   Procedure: RECTAL ULTRASOUND;  Surgeon: Romie Levee, MD;  Location: [facility];  Service: Endoscopy;  Laterality: N/A;    Prior to Admission medications   Medication Sig Start Date End Date Taking? Authorizing Provider  acetaminophen (TYLENOL) 650 MG CR tablet Take 650 mg by mouth every 8 (eight) hours as needed for pain.   Yes Historical Provider, MD  amiodarone (PACERONE) 100 MG tablet Take 1 tablet (100 mg total) by mouth daily. 05/12/16  Yes Peter M [place], MD  apixaban (ELIQUIS) 2.5 MG TABS tablet Take 1 tablet (2.5 mg total) by mouth 2 (two) times daily. 04/01/16  Yes Peter M [place], MD  Calcium Carb-Cholecalciferol (CALCIUM-VITAMIN D) 600-400 MG-UNIT TABS Take 1 tablet by mouth 2 (two) times daily.    Yes Historical Provider, MD  docusate sodium (COLACE) 100 MG capsule Take 100 mg by mouth at bedtime.    Yes Historical Provider, MD  guaiFENesin (MUCINEX) 600 MG 12 hr tablet Take 600 mg by mouth 2 (two) times daily as needed for cough.    Yes Historical Provider, MD  hydroxypropyl methylcellulose / hypromellose (ISOPTO TEARS / GONIOVISC) 2.5 % ophthalmic solution Place 1 drop into both eyes 4 (four) times daily as needed for dry eyes.   Yes Historical Provider, MD  ipratropium (ATROVENT)  0.06 % nasal spray Place 2 sprays into the nose 2 (two) times daily. 08/30/16  Yes Historical Provider, MD  levothyroxine (SYNTHROID, LEVOTHROID) 75 MCG tablet Take 75 mcg by mouth daily before breakfast.   Yes Historical Provider, MD  nitrofurantoin (MACRODANTIN) 50 MG capsule Take 50 mg by mouth daily with breakfast.  12/27/14  Yes Historical Provider, MD  pantoprazole (PROTONIX) 40 MG tablet Take 40 mg by mouth daily with breakfast.  01/10/13  Yes Dorothea OgleIskra M Myers, MD   phenylephrine-shark liver oil-mineral oil-petrolatum (PREPARATION H) 0.25-3-14-71.9 % rectal ointment Place 1 application rectally 2 (two) times daily as needed for hemorrhoids.   Yes Historical Provider, MD  polyethylene glycol (MIRALAX / GLYCOLAX) packet Take by mouth daily as needed for mild constipation or moderate constipation.   Yes Historical Provider, MD  witch hazel-glycerin (TUCKS) pad Apply 1 application topically as needed for hemorrhoids.   Yes Historical Provider, MD    Scheduled Meds: . sodium chloride flush  3 mL Intravenous Q12H   Continuous Infusions: . sodium chloride     PRN Meds:.acetaminophen **OR** acetaminophen, levalbuterol, ondansetron **OR** ondansetron (ZOFRAN) IV  Allergies as of 09/01/2016 - Review Complete 09/01/2016  Allergen Reaction Noted  . Bactrim [sulfamethoxazole-trimethoprim] Other (See Comments) 09/04/2014  . Contrast media [iodinated diagnostic agents] Other (See Comments) 12/12/2015  . Nsaids Other (See Comments) 09/04/2014    Family History  Problem Relation Age of Onset  . Diabetes Father   . Leukemia Father   . Breast cancer Paternal Aunt   . Breast cancer Paternal Aunt   . Cancer      several family members on dad's side of family  . Heart failure Brother     Pt cannot confirm this history    Social History   Social History  . Marital status: Widowed    Spouse name: N/A  . Number of children: 4  . Years of education: N/A   Occupational History  . Retired Chartered loss adjusterschoolteacher    Social History Main Topics  . Smoking status: Never Smoker  . Smokeless tobacco: Never Used  . Alcohol use No  . Drug use: No  . Sexual activity: No   Other Topics Concern  . Not on file   Social History Narrative   Widowed.  Lives alone.  Uses a cane/walker to ambulate but independent of ADLs.    Review of Systems: All negative except as stated above in HPI.  Physical Exam: Vital signs: Vitals:   09/01/16 1300 09/01/16 1403  BP: (!) 130/47  (!) 116/49  Pulse: 71 78  Resp: 21 17  Temp:    T 98   General:   Elderly, frail, alert,  Well-developed, well-nourished, pleasant and cooperative in NAD HEENT: anicteric sclera, oropharynx clear Neck: supple, nontender Lungs:  Clear throughout to auscultation.   No wheezes, crackles, or rhonchi. No acute distress. Heart:  Regular rate and rhythm; no murmurs, clicks, rubs,  or gallops. Abdomen: soft, nontender, nondistended, +BS  Rectal:  nonthrombosed external hemorrhoids, nonthrombosed prolapsed internal hemorrhoids that spontaneously reduce, no masses palpated on DRE, no blood on gloved finger, no fissure noted, minimally tender at 12 o'clock position on DRE Ext: 2+ pitting LE edema  GI:  Lab Results:  Recent Labs  09/01/16 1035  WBC 16.3*  HGB 6.0*  HCT 19.6*  PLT 376   BMET  Recent Labs  09/01/16 1035  NA 137  K 4.3  CL 104  CO2 26  GLUCOSE 115*  BUN 23*  CREATININE 1.53*  CALCIUM 8.3*   LFT No results for input(s): PROT, ALBUMIN, AST, ALT, ALKPHOS, BILITOT, BILIDIR, IBILI in the last 72 hours. PT/INR  Recent Labs  09/01/16 1035  LABPROT 14.7  INR 1.15     Studies/Results: Dg Chest 2 View  Result Date: 09/01/2016 CLINICAL DATA:  83 y/o female with anemia. Recent bleeding from hemorrhoids. Shortness of breath. Initial encounter. EXAM: CHEST  2 VIEW COMPARISON:  Chest CT 08/05/2016 and earlier. FINDINGS: Chronic large gastric hiatal hernia. Mediastinal contours appear stable. Calcified aortic atherosclerosis. Stable lung volumes. No pneumothorax, pulmonary edema or pleural effusion. There is atelectasis suspected associated with the mediastinal hernia, but no superimposed consolidation. Osteopenia. No acute osseous abnormality identified. IMPRESSION: 1.  No acute cardiopulmonary abnormality. 2. Large gastric hiatal hernia. 3.  Calcified aortic atherosclerosis. Electronically Signed   By: Odessa Fleming M.D.   On: 09/01/2016 13:57    Impression/Plan: Rectal  bleeding on Eliquis that is likely a rectal outlet source from her hemorrhoids. I do NOT think she is having a upper tract or small bowel source for this BRBPR. Doubt a diverticular source for this intermittent bleeding. Would manage conservatively with topical hemorrhoidal treatment and if that is unsuccessful then she may need to be seen by the surgeons for therapeutic management of her hemorrhoids with banding or scleral therapy or other non-surgical treatment. She is not willing to drink a colon prep and neither she nor her daughter think that she could drink a colon prep. That being said I am NOT recommending a colonoscopy at this time. If her bleeding continues to occur, then may need a flex sig but right now I do not think that is necessary based on the extensive hemorrhoids that she has. Will start Proctofoam and follow. D/W daughter.    LOS: 0 days   Jandel Patriarca C.  09/01/2016, 2:32 PM  Pager [phone]  If no answer or after 5 PM call [phone]

## 2016-09-01 NOTE — ED Provider Notes (Signed)
[facility] DEPT Provider Note   CSN: [id number] Arrival date & time: 09/01/16  0935     History   Chief Complaint Chief Complaint  Patient presents with  . Abnormal Lab    HPI Carla [name] is a 83 y.o. female.  The history is provided by the patient and a relative.  Abnormal Lab  Time since result:  Yesterday Patient referred by:  PCP Resulting agency:  External Resulting agency details:  6.4 Result type: hematology   Hematology:    Hemoglobin:  Low GI Problem  This is a recurrent problem. Episode onset: 3 days ago. The problem occurs constantly. The problem has not changed since onset.Associated symptoms include shortness of breath (worse the last 2 days). Pertinent negatives include no chest pain and no abdominal pain. Associated symptoms comments: weakness. Nothing aggravates the symptoms. Nothing relieves the symptoms. She has tried nothing for the symptoms.    Past Medical History:  Diagnosis Date  . Atrial fibrillation (HCC)   . Cataracts, bilateral   . Chronic kidney disease    chronic kidney disease  . Cystocele   . Diverticulosis   . Dysrhythmia    A- fib  . Encounter for fitting and adjustment of pessary 04/23/2002  . Female bladder prolapse   . Gallstones   . Hemorrhoids   . Hiatal hernia   . High cholesterol   . Hyperkalemia   . Hyperlipidemia   . Hypothyroidism   . Iron deficiency anemia   . Leukocytosis   . Lordosis   . Osteoarthritis   . Osteoarthritis   . Osteoporosis   . Pneumonia    hx aspiration pneumonia  . Rectocele   . Renal insufficiency   . Scoliosis   . Urinary tract infection without hematuria     Patient Active Problem List   Diagnosis Date Noted  . Fracture, intertrochanteric, right femur (HCC) 12/13/2015  . Closed right femoral fracture (HCC) 12/12/2015  . Femur fracture, right (HCC) 12/12/2015  . Pressure ulcer 08/16/2015  . Pulmonary embolism (HCC) 08/15/2015  . Elevated troponin level 08/15/2015  .  Elevated brain natriuretic peptide (BNP) level 08/15/2015  . Hypothyroid 02/27/2015  . 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 Acid reflux 02/21/2014  . Adult hypothyroidism 04/23/2013  . Abnormal TSH 01/09/2013  . Intractable nausea and vomiting 01/08/2013  . Elevated lipase 01/08/2013  . Hyperglycemia 01/08/2013  . Arthritis 12/19/2012  . Presence of pessary 09/26/2012  . Incomplete bladder emptying 09/04/2012  . Urinary tract infection 08/23/2012  . Gram negative sepsis (HCC) 08/23/2012  . Intractable nausea and vomiting 08/19/2012  . Hypotension, unspecified 08/19/2012  . Dehydration 08/19/2012  . Atrial fibrillation (HCC) 08/19/2012  . Leukocytosis 08/19/2012  . Acute kidney injury (HCC) 08/19/2012  . Hypovolemic shock (HCC) 08/19/2012  . Iron deficiency anemia   . High cholesterol   . Osteoarthritis   . Female bladder prolapse   . Osteoporosis   . Hemorrhoids   . Diverticulosis   . Hiatal hernia   . Back ache 01/19/2012  . Scoliosis 01/19/2012  . Absolute anemia 09/30/2011  . HLD (hyperlipidemia) 09/23/2011    Past Surgical History:  Procedure Laterality Date  . CATARACT EXTRACTION    . CHOLECYSTECTOMY    . COLONOSCOPY    . ENDOMETRIAL BIOPSY  09/2009  . ESOPHAGOGASTRODUODENOSCOPY    . ESOPHAGOGASTRODUODENOSCOPY N/A 01/09/2013   Procedure: ESOPHAGOGASTRODUODENOSCOPY (EGD);  Surgeon: Vertell Novak., MD;  Location: [facility];  Service: Endoscopy;  Laterality: N/A;  . INTRAMEDULLARY (IM) NAIL  INTERTROCHANTERIC Right 12/13/2015   Procedure: INTRAMEDULLARY (IM) NAIL INTERTROCHANTRIC;  Surgeon: Samson Frederic, MD;  Location: [facility];  Service: Orthopedics;  Laterality: Right;  . RECTAL ULTRASOUND N/A 04/10/2014   Procedure: RECTAL ULTRASOUND;  Surgeon: Romie Levee, MD;  Location: [facility];  Service: Endoscopy;  Laterality: N/A;    OB History    Gravida Para Term Preterm AB Living   4 4       4    SAB TAB Ectopic Multiple Live Births                   Home  Medications    Prior to Admission medications   Medication Sig Start Date End Date Taking? Authorizing Provider  acetaminophen (TYLENOL) 650 MG CR tablet Take 650 mg by mouth every 8 (eight) hours as needed for pain.   Yes Historical Provider, MD  amiodarone (PACERONE) 100 MG tablet Take 1 tablet (100 mg total) by mouth daily. 05/12/16  Yes Peter M [place], MD  apixaban (ELIQUIS) 2.5 MG TABS tablet Take 1 tablet (2.5 mg total) by mouth 2 (two) times daily. 04/01/16  Yes Peter M [place], MD  Calcium Carb-Cholecalciferol (CALCIUM-VITAMIN D) 600-400 MG-UNIT TABS Take 1 tablet by mouth 2 (two) times daily.    Yes Historical Provider, MD  docusate sodium (COLACE) 100 MG capsule Take 100 mg by mouth at bedtime.    Yes Historical Provider, MD  guaiFENesin (MUCINEX) 600 MG 12 hr tablet Take 600 mg by mouth 2 (two) times daily as needed for cough.    Yes Historical Provider, MD  hydroxypropyl methylcellulose / hypromellose (ISOPTO TEARS / GONIOVISC) 2.5 % ophthalmic solution Place 1 drop into both eyes 4 (four) times daily as needed for dry eyes.   Yes Historical Provider, MD  ipratropium (ATROVENT) 0.06 % nasal spray Place 2 sprays into the nose 2 (two) times daily. 08/30/16  Yes Historical Provider, MD  levothyroxine (SYNTHROID, LEVOTHROID) 75 MCG tablet Take 75 mcg by mouth daily before breakfast.   Yes Historical Provider, MD  nitrofurantoin (MACRODANTIN) 50 MG capsule Take 50 mg by mouth daily with breakfast.  12/27/14  Yes Historical Provider, MD  pantoprazole (PROTONIX) 40 MG tablet Take 40 mg by mouth daily with breakfast.  01/10/13  Yes Dorothea Ogle, MD  phenylephrine-shark liver oil-mineral oil-petrolatum (PREPARATION H) 0.25-3-14-71.9 % rectal ointment Place 1 application rectally 2 (two) times daily as needed for hemorrhoids.   Yes Historical Provider, MD  polyethylene glycol (MIRALAX / GLYCOLAX) packet Take by mouth daily as needed for mild constipation or moderate constipation.   Yes Historical  Provider, MD  witch hazel-glycerin (TUCKS) pad Apply 1 application topically as needed for hemorrhoids.   Yes Historical Provider, MD    Family History Family History  Problem Relation Age of Onset  . Diabetes Father   . Leukemia Father   . Breast cancer Paternal Aunt   . Breast cancer Paternal Aunt   . Cancer      several family members on dad's side of family  . Heart failure Brother     Pt cannot confirm this history    Social History Social History  Substance Use Topics  . Smoking status: Never Smoker  . Smokeless tobacco: Never Used  . Alcohol use No     Allergies   Bactrim [sulfamethoxazole-trimethoprim]; Contrast media [iodinated diagnostic agents]; and Nsaids   Review of Systems Review of Systems  Respiratory: Positive for shortness of breath (worse the last 2 days).   Cardiovascular: Negative  for chest pain.  Gastrointestinal: Negative for abdominal pain.  All other systems reviewed and are negative.    Physical Exam Updated Vital Signs BP (!) 150/50 (BP Location: Left Arm)   Pulse 64   Temp 97.5 F (36.4 C) (Oral)   Resp 16   Ht 5' 2.5" (1.588 m)   Wt 173 lb (78.5 kg)   SpO2 95%   BMI 31.14 kg/m   Physical Exam  Constitutional: She is oriented to person, place, and time. She appears well-developed and well-nourished. No distress.  HENT:  Head: Normocephalic.  Nose: Nose normal.  Pale mucous membranes and pale conjunctivae  Eyes: Pupils are equal, round, and reactive to light.  Neck: Neck supple. No tracheal deviation present.  Cardiovascular: Normal rate, regular rhythm and normal heart sounds.   Pulmonary/Chest: Effort normal and breath sounds normal. No respiratory distress.  Abdominal: Soft. She exhibits no distension.  Neurological: She is alert and oriented to person, place, and time.  Skin: Skin is warm and dry. Capillary refill takes more than 3 seconds. There is pallor.  Psychiatric: She has a normal mood and affect.  Vitals  reviewed.    ED Treatments / Results  Labs (all labs ordered are listed, but only abnormal results are displayed) Labs Reviewed  CBC WITH DIFFERENTIAL/PLATELET  BASIC METABOLIC PANEL  VITAMIN B12  FOLATE  IRON AND TIBC  FERRITIN  RETICULOCYTES  APTT  PROTIME-INR  POC OCCULT BLOOD, ED  TYPE AND SCREEN    EKG  EKG Interpretation None       Radiology No results found.  Procedures Procedures (including critical care time)  CRITICAL CARE Performed by: Lyndal Pulley Total critical care time: 30 minutes Critical care time was exclusive of separately billable procedures and treating other patients. Critical care was necessary to treat or prevent imminent or life-threatening deterioration. Critical care was time spent personally by me on the following activities: development of treatment plan with patient and/or surrogate as well as nursing, discussions with consultants, evaluation of patient's response to treatment, examination of patient, obtaining history from patient or surrogate, ordering and performing treatments and interventions, ordering and review of laboratory studies, ordering and review of radiographic studies, pulse oximetry and re-evaluation of patient's condition.  Medications Ordered in ED Medications - No data to display   Initial Impression / Assessment and Plan / ED Course  I have reviewed the triage vital signs and the nursing notes.  Pertinent labs & imaging results that were available during my care of the patient were reviewed by me and considered in my medical decision making (see chart for details).     83 y.o. female presents with recurrent symptomatic anemia and lower GI bleeding. She states it recurred 3 days ago when she was on eliquis. She discontinued it and it stopped yesterday so they restarted eliquis last night and bleeding recurred this morning. Hb is down to 6 with a component of active bleeding so 2u PRBC given for resuscitation. No  indication for reversal of NOAC as she is HDS.  Hospitalist was consulted for admission and will see the patient in the emergency department. Discussed possibility of stopping eliquis given multiple frequent bleeding episodes, lack of persistent atrial fibrillation for periods lasting more than 48 hours, and IVC filter in place.   Final Clinical Impressions(s) / ED Diagnoses   Final diagnoses:  Acute blood loss anemia  Bleeding hemorrhoids  Anticoagulated by anticoagulation treatment    New Prescriptions Current Discharge Medication List  Lyndal Pulleyaniel Basha Krygier, MD 09/01/16 [phone]

## 2016-09-01 NOTE — ED Notes (Signed)
BED CHANGED TO 1226

## 2016-09-01 NOTE — ED Notes (Signed)
ICU CALLED TRANSFER PT AT 1440

## 2016-09-01 NOTE — ED Triage Notes (Signed)
Patient states her PCP office called and told her she needed to get to the hospital due to a low hemoglobin (6.4). Patient has been having bleeding from hemorrhoids and is on Eliquis.

## 2016-09-02 ENCOUNTER — Inpatient Hospital Stay (HOSPITAL_COMMUNITY): Payer: Medicare Other

## 2016-09-02 ENCOUNTER — Encounter (HOSPITAL_COMMUNITY): Payer: Self-pay

## 2016-09-02 DIAGNOSIS — K922 Gastrointestinal hemorrhage, unspecified: Secondary | ICD-10-CM

## 2016-09-02 DIAGNOSIS — I480 Paroxysmal atrial fibrillation: Secondary | ICD-10-CM

## 2016-09-02 DIAGNOSIS — K2971 Gastritis, unspecified, with bleeding: Secondary | ICD-10-CM

## 2016-09-02 DIAGNOSIS — D500 Iron deficiency anemia secondary to blood loss (chronic): Secondary | ICD-10-CM

## 2016-09-02 DIAGNOSIS — R002 Palpitations: Secondary | ICD-10-CM

## 2016-09-02 LAB — CBC
HEMATOCRIT: 28.6 % — AB (ref 36.0–46.0)
HEMATOCRIT: 25.6 % — AB (ref 36.0–46.0)
HEMOGLOBIN: 8 g/dL — AB (ref 12.0–15.0)
Hemoglobin: 8.8 g/dL — ABNORMAL LOW (ref 12.0–15.0)
MCH: 23.4 pg — AB (ref 26.0–34.0)
MCH: 24.1 pg — AB (ref 26.0–34.0)
MCHC: 30.8 g/dL (ref 30.0–36.0)
MCHC: 31.3 g/dL (ref 30.0–36.0)
MCV: 76.1 fL — AB (ref 78.0–100.0)
MCV: 77.1 fL — ABNORMAL LOW (ref 78.0–100.0)
Platelets: 341 10*3/uL (ref 150–400)
Platelets: 339 10*3/uL (ref 150–400)
RBC: 3.76 MIL/uL — ABNORMAL LOW (ref 3.87–5.11)
RBC: 3.32 MIL/uL — AB (ref 3.87–5.11)
RDW: 18.3 % — ABNORMAL HIGH (ref 11.5–15.5)
RDW: 18.6 % — ABNORMAL HIGH (ref 11.5–15.5)
WBC: 14.1 10*3/uL — AB (ref 4.0–10.5)
WBC: 11.8 10*3/uL — ABNORMAL HIGH (ref 4.0–10.5)

## 2016-09-02 LAB — COMPREHENSIVE METABOLIC PANEL
ALBUMIN: 3.2 g/dL — AB (ref 3.5–5.0)
ALT: 8 U/L — ABNORMAL LOW (ref 14–54)
ANION GAP: 6 (ref 5–15)
AST: 13 U/L — ABNORMAL LOW (ref 15–41)
Alkaline Phosphatase: 52 U/L (ref 38–126)
BUN: 16 mg/dL (ref 6–20)
CALCIUM: 8.2 mg/dL — AB (ref 8.9–10.3)
CHLORIDE: 108 mmol/L (ref 101–111)
CO2: 27 mmol/L (ref 22–32)
Creatinine, Ser: 1.32 mg/dL — ABNORMAL HIGH (ref 0.44–1.00)
GFR calc non Af Amer: 34 mL/min — ABNORMAL LOW (ref >60)
GFR, EST AFRICAN AMERICAN: 39 mL/min — AB (ref >60)
GLUCOSE: 95 mg/dL (ref 65–99)
POTASSIUM: 4.3 mmol/L (ref 3.5–5.1)
SODIUM: 141 mmol/L (ref 135–145)
Total Bilirubin: 0.8 mg/dL (ref 0.3–1.2)
Total Protein: 6 g/dL — ABNORMAL LOW (ref 6.5–8.1)

## 2016-09-02 LAB — ECHOCARDIOGRAM COMPLETE
HEIGHTINCHES: 62 in
WEIGHTICAEL: 2818.36 [oz_av]

## 2016-09-02 LAB — TYPE AND SCREEN
ABO/RH(D): O NEG
ANTIBODY SCREEN: NEGATIVE
UNIT DIVISION: 0
Unit division: 0

## 2016-09-02 LAB — TROPONIN I: Troponin I: 0.04 ng/mL (ref <0.03)

## 2016-09-02 LAB — BPAM RBC
BLOOD PRODUCT EXPIRATION DATE: 201803272359
Blood Product Expiration Date: 201803302359
ISSUE DATE / TIME: 201802281519
ISSUE DATE / TIME: 201802281721
UNIT TYPE AND RH: 9500
Unit Type and Rh: 9500

## 2016-09-02 LAB — MRSA PCR SCREENING: MRSA by PCR: NEGATIVE

## 2016-09-02 MED ORDER — NITROFURANTOIN MACROCRYSTAL 50 MG PO CAPS
50.0000 mg | ORAL_CAPSULE | Freq: Every day | ORAL | Status: DC
  Administered 2016-09-03: 50 mg via ORAL
  Filled 2016-09-02: qty 1

## 2016-09-02 MED ORDER — SODIUM CHLORIDE 0.9 % IV SOLN
25.0000 mg | Freq: Once | INTRAVENOUS | Status: AC
  Administered 2016-09-02: 25 mg via INTRAVENOUS
  Filled 2016-09-02: qty 0.5

## 2016-09-02 MED ORDER — SODIUM CHLORIDE 0.9 % IV SOLN
100.0000 mg | Freq: Once | INTRAVENOUS | Status: AC
  Administered 2016-09-02: 100 mg via INTRAVENOUS
  Filled 2016-09-02: qty 2

## 2016-09-02 NOTE — Progress Notes (Signed)
[facility]  Carla [name] 83 y.o. Nov 20, 1921   Subjective: Feels ok.   Objective: Vital signs in last 24 hours: Vitals:   09/02/16 1200 09/02/16 1300  BP:    Pulse:    Resp: 14 (!) 26  Temp:    T 97.6, P 72, BP 154/53  Physical Exam: Gen: elderly, alert, no acute distress HEENT: anicteric sclera CV: RRR Chest: CTA B Abd: soft, nontender, nondistended, +BS  Lab Results:  Recent Labs  09/01/16 1035 09/01/16 1248 09/02/16 0343  NA 137  --  141  K 4.3  --  4.3  CL 104  --  108  CO2 26  --  27  GLUCOSE 115*  --  95  BUN 23*  --  16  CREATININE 1.53*  --  1.32*  CALCIUM 8.3*  --  8.2*  MG  --  1.9  --     Recent Labs  09/02/16 0343  AST 13*  ALT 8*  ALKPHOS 52  BILITOT 0.8  PROT 6.0*  ALBUMIN 3.2*    Recent Labs  09/01/16 1035 09/01/16 2236 09/02/16 0343  WBC 16.3* 14.1* 11.8*  NEUTROABS 12.6*  --   --   HGB 6.0* 8.8* 8.0*  HCT 19.6* 28.6* 25.6*  MCV 74.5* 76.1* 77.1*  PLT 376 341 339    Recent Labs  09/01/16 1035  LABPROT 14.7  INR 1.15      Assessment/Plan: Rectal bleeding likely hemorrhoidal. Hgb 8.0 (8.8, 6.0). Continue conservative management with Proctofoam. Advance diet. No plans for sigmoidoscopy. Would continue to avoid further anticoagulation but defer to cards and hospitalist. Will sign off. Call back if needed. F/U in office prn. If bleeding recurs as an outpt, then needs to see the surgeons for possible hemorrhoidal treatment.   Carla [name] C. 09/02/2016, 1:13 PM   Pager [phone]  If no answer or after 5 PM call [phone] ID: Carla FellerEarline S [name], female   DOB: Nov 20, 1921, 83 y.o.   MRN: [id number]

## 2016-09-02 NOTE — Progress Notes (Signed)
Received pt from ICU/SD no acute changes. Pt resting without distress and oriented to room. Pt eatting call bell near, bed alarm activated. SRP, RN

## 2016-09-02 NOTE — Care Management Note (Signed)
Case Management Note  Patient Details  Name: Carla [name] MRN: [id number] Date of Birth: March 04, 1922  Subjective/Objective:     Gi bleed with hypotension and requiring blood transfusion               Action/Plan: From Home with son uses [facility] for [facility]   Expected Discharge Date:   (UNKNOWN)               Expected Discharge Plan:  Home/Self Care  In-House Referral:     Discharge planning Services  CM Consult  Post Acute Care Choice:    Choice offered to:     DME Arranged:    DME Agency:     HH Arranged:    HH Agency:     Status of Service:  In process, will continue to follow  If discussed at Long Length of Stay Meetings, dates discussed:    Additional Comments:  Golda AcreDavis, Keiana Tavella Lynn, RN 09/02/2016, 11:19 AM

## 2016-09-02 NOTE — Progress Notes (Signed)
  Echocardiogram 2D Echocardiogram has been performed.  Janalyn HarderWest, Rendon Howell R 09/02/2016, 12:44 PM

## 2016-09-02 NOTE — Progress Notes (Signed)
PROGRESS NOTE    Carla [name]  ZOX:[id number] DOB: May 08, 1922 DOA: 09/01/2016 PCP: [name], MD  Outpatient Specialists:   Gynecology Dr. Leda Quail General surgery Dr. Alma Downs Orthopedics Dr. Ernestine Conrad type [facility] gastroenterology Dr. Randa Evens Cardiology Dr. [place]  Brief Narrative:  53 ? Atrial fibrillation [place] score 4 on chronic Elliquis  Previously on Coumadin, discontinued 11/2015 by [facility]ologist Prior right intratrochanteric femur fracture status post repair 12/2015 Right leg DVT/acute submassive PE right heart strain 08/2015-IVC filter placed Large hiatal hernia + gastritis = high risk bleed  EGD 01/2013 ulcerative esophagitis and erosive gastritis in massive hiatal hernia Last colonoscopy 9/11 showed diffuse diverticulosis Rectovaginal, vesicovaginal fistula is followed at [facility]  Complications of recurrent UTIs secondary to fistula, pessary use Hypothyroidism Pulmonary hypertension based on echocardiogram 08/11/12  Had a visit to the emergency room 07/15/16 with rectal bleeding-requested to discharge home  Represented to emergency room hemoglobin found to be 6, patient requested to hold Elliquis and was transfused On admission troponin 0.04 BUN/creatinine 23/1.5 Iron level less than 5 saturation ratio was not calculated Hemoglobin 16.3 hemoglobin 6.0 platelet 370--Baseline hemoglobin is 10.3, baseline creatinine is 16/1.2 in January 2018  Assessment & Plan:   Principal Problem:   GI bleed Active Problems:   Iron deficiency anemia   Diverticulosis   Atrial fibrillation (HCC)   HLD (hyperlipidemia)   Acute GI bleed secondary to hemorrhoidal bleeding-holding anticoagulation. Has IVC filter to protect from further. However indications still remains given atrial fibrillation. Dr. Bosie Clos is of the opinion this is neither diverticular not upper GI in source and recommends topical management[prcotofoam/tucks] and discussion if symptoms persist with  general surgery. Patient has indicated she does not wish colonic prep at this stage-continue Preparation H for the hemorrhoids and gastroenterology has recommended Proctofoam as well twice a day-is on clear liquid diet which will be advanced as per gastroenterology instructions unclear if she needs oxygen and will wean down as needed. Anemia of acute blood loss-hemoglobin has gone from 6--8 with 2 units of PRBC. Iron level is less than 5 so IV iron may be indicated in this setting. Transient elevation of troponin-trend is flat would not workup further at this juncture. Atrial fibrillation [place] score 4-holding Elliquis. Hypotensive on admission.  Hypertension seems to have resolved with blood and saline. Right leg DVT, acute submassive PE right heart strain 08/2015 with IVC filter-need to discuss as an outpatient timing of removal of IVC filter if ever removable. For now hold off on anticoagulation. Rectovaginal, vesicovaginal fistulas, prolapse uterus-previously on Macrodantin 50 daily for suppressive therapy. Monitor--will resume the macrodantin 09/02/16 Reflux continue Protonix 40 daily Hypothyroidism continue levothyroxine 75 daily-TSH is normal Constipation hold off of MiraLAX for now, Colace 100 daily at bedtime   DVT prophylaxis:scd Code Status: Full Family Communication: called to chat with daughter--long discussion and ultimately feel that patient would probably need to be off of Elliquis regardless of etiology whether hemorrhoidal or lower GI tract-if no further bleeding while in hospital with symptomatic management hemorrhoidal bleed, would advocate to stay off Elliquis at least until can be seen by general surgery in the outpatient setting for banding. If she has further bleeding than we will defer to GI regarding utility of the sigmoidoscopy and patient is willing to do the same if needed Disposition Plan: inpatient peneding resolution   Consultants:    Gastroenterology  Cardiology  Procedures:   None as yet  Antimicrobials:   None as yet    Subjective:   Alert  pleasant in nad Doing fair No further rectal blered Eating and drinking clears No sob Tells me has had some bleeding with elliquis since the past 2 months when this was started  Objective: Vitals:   09/02/16 0000 09/02/16 0341 09/02/16 0400 09/02/16 0600  BP: (!) 141/50  133/61   Pulse:      Resp: 20  (!) 23 16  Temp:  98.3 F (36.8 C)    TempSrc:  Oral    SpO2: 92%  92% 91%  Weight:      Height:        Intake/Output Summary (Last 24 hours) at 09/02/16 0736 Last data filed at 09/01/16 1914  Gross per 24 hour  Intake          1014.42 ml  Output                0 ml  Net          1014.42 ml   Filed Weights   09/01/16 0944 09/01/16 1505  Weight: 78.5 kg (173 lb) 79.9 kg (176 lb 2.4 oz)    Examination:  General exam: Appears calm and comfortable  Respiratory system: Clear to auscultation. Respiratory effort normal. Cardiovascular system: S1 & S2 heard, RRR. No JVD, murmurs, rubs, gallops or clicks. No pedal edema. Gastrointestinal system: Abdomen is nondistended, soft and nontender. No organomegaly or masses felt. Normal bowel sounds heard.  Prolapsed stage 3 hemorrhoids at anal orifice 12 o'clock-seems non thrombosed Central nervous system: Alert and oriented. No focal neurological deficits. Extremities: Symmetric 5 x 5 power. Skin: No rashes, lesions or ulcers Psychiatry: Judgement and insight appear normal. Mood & affect appropriate.     Data Reviewed: I have personally reviewed following labs and imaging studies  CBC:  Recent Labs Lab 09/01/16 1035 09/01/16 2236 09/02/16 0343  WBC 16.3* 14.1* 11.8*  NEUTROABS 12.6*  --   --   HGB 6.0* 8.8* 8.0*  HCT 19.6* 28.6* 25.6*  MCV 74.5* 76.1* 77.1*  PLT 376 341 339   Basic Metabolic Panel:  Recent Labs Lab 09/01/16 1035 09/01/16 1248 09/02/16 0343  NA 137  --  141  K 4.3  --  4.3   CL 104  --  108  CO2 26  --  27  GLUCOSE 115*  --  95  BUN 23*  --  16  CREATININE 1.53*  --  1.32*  CALCIUM 8.3*  --  8.2*  MG  --  1.9  --    GFR: Estimated Creatinine Clearance: 26.6 mL/min (by C-G formula based on SCr of 1.32 mg/dL (H)). Liver Function Tests:  Recent Labs Lab 09/02/16 0343  AST 13*  ALT 8*  ALKPHOS 52  BILITOT 0.8  PROT 6.0*  ALBUMIN 3.2*   No results for input(s): LIPASE, AMYLASE in the last 168 hours. No results for input(s): AMMONIA in the last 168 hours. Coagulation Profile:  Recent Labs Lab 09/01/16 1035  INR 1.15   Cardiac Enzymes:  Recent Labs Lab 09/01/16 1248 09/01/16 1908 09/02/16 0035  TROPONINI 0.04* 0.04* 0.04*   BNP (last 3 results) No results for input(s): PROBNP in the last 8760 hours. HbA1C: No results for input(s): HGBA1C in the last 72 hours. CBG: No results for input(s): GLUCAP in the last 168 hours. Lipid Profile: No results for input(s): CHOL, HDL, LDLCALC, TRIG, CHOLHDL, LDLDIRECT in the last 72 hours. Thyroid Function Tests:  Recent Labs  09/01/16 1035 09/01/16 1248  TSH  --  2.005  FREET4 1.47*  --  Anemia Panel:  Recent Labs  09/01/16 1035  VITAMINB12 198  FOLATE 27.3  FERRITIN 5*  TIBC 330  IRON <5*  RETICCTPCT 3.2*   Urine analysis:    Component Value Date/Time   COLORURINE YELLOW 12/16/2015 1135   APPEARANCEUR CLEAR 12/16/2015 1135   LABSPEC 1.017 12/16/2015 1135   PHURINE 5.5 12/16/2015 1135   GLUCOSEU NEGATIVE 12/16/2015 1135   HGBUR TRACE (A) 12/16/2015 1135   BILIRUBINUR NEGATIVE 12/16/2015 1135   KETONESUR NEGATIVE 12/16/2015 1135   PROTEINUR 30 (A) 12/16/2015 1135   UROBILINOGEN 0.2 01/08/2013 0759   NITRITE POSITIVE (A) 12/16/2015 1135   LEUKOCYTESUR SMALL (A) 12/16/2015 1135   Sepsis Labs: @LABRCNTIP (procalcitonin:4,lacticidven:4)  ) Recent Results (from the past 240 hour(s))  MRSA PCR Screening     Status: None   Collection Time: 09/01/16 11:03 PM  Result Value  Ref Range Status   MRSA by PCR NEGATIVE NEGATIVE Final    Comment:        The GeneXpert MRSA Assay (FDA approved for NASAL specimens only), is one component of a comprehensive MRSA colonization surveillance program. It is not intended to diagnose MRSA infection nor to guide or monitor treatment for MRSA infections.          Radiology Studies: Dg Chest 2 View  Result Date: 09/01/2016 CLINICAL DATA:  83 y/o female with anemia. Recent bleeding from hemorrhoids. Shortness of breath. Initial encounter. EXAM: CHEST  2 VIEW COMPARISON:  Chest CT 08/05/2016 and earlier. FINDINGS: Chronic large gastric hiatal hernia. Mediastinal contours appear stable. Calcified aortic atherosclerosis. Stable lung volumes. No pneumothorax, pulmonary edema or pleural effusion. There is atelectasis suspected associated with the mediastinal hernia, but no superimposed consolidation. Osteopenia. No acute osseous abnormality identified. IMPRESSION: 1.  No acute cardiopulmonary abnormality. 2. Large gastric hiatal hernia. 3.  Calcified aortic atherosclerosis. Electronically Signed   By: Odessa FlemingH  Hall M.D.   On: 09/01/2016 13:57        Scheduled Meds: . amiodarone  100 mg Oral Daily  . docusate sodium  100 mg Oral QHS  . levothyroxine  75 mcg Oral QAC breakfast  . pantoprazole  40 mg Oral Q breakfast  . pramoxine   Rectal BID  . sodium chloride flush  3 mL Intravenous Q12H   Continuous Infusions:   LOS: 1 day    Time spent: 2540    Pleas KochJai Giara Mcgaughey, MD [facility] [phone]   If 7PM-7AM, please contact night-coverage [email or web address] Password [email or web address] 09/02/2016, 7:36 AM

## 2016-09-02 NOTE — Consult Note (Signed)
Cardiology Consult    Patient ID: Carla [name] MRN: [id number], DOB/AGE: 08-Sep-1921   Admit date: 09/01/2016 Date of Consult: 09/02/2016  Primary Physician: [name], MD Primary Cardiologist: [place] Requesting Provider: Susie Cassette Reason for Consultation: AF, Anticoagulation   Patient Profile    83 yo female with PMH of PAF, HTN, Hiatal hernia, PE and DVT, and hip fracture who presented with rectal bleeding.   Past Medical History   Past Medical History:  Diagnosis Date  . Atrial fibrillation (HCC)   . Cataracts, bilateral   . Chronic kidney disease    chronic kidney disease  . Cystocele   . Diverticulosis   . Dysrhythmia    A- fib  . Encounter for fitting and adjustment of pessary 04/23/2002  . Female bladder prolapse   . Gallstones   . Hemorrhoids   . Hiatal hernia   . High cholesterol   . Hyperkalemia   . Hyperlipidemia   . Hypothyroidism   . Iron deficiency anemia   . Leukocytosis   . Lordosis   . Osteoarthritis   . Osteoarthritis   . Osteoporosis   . Pneumonia    hx aspiration pneumonia  . Rectocele   . Renal insufficiency   . Scoliosis   . Urinary tract infection without hematuria     Past Surgical History:  Procedure Laterality Date  . CATARACT EXTRACTION    . CHOLECYSTECTOMY    . COLONOSCOPY    . ENDOMETRIAL BIOPSY  09/2009  . ESOPHAGOGASTRODUODENOSCOPY    . ESOPHAGOGASTRODUODENOSCOPY N/A 01/09/2013   Procedure: ESOPHAGOGASTRODUODENOSCOPY (EGD);  Surgeon: Vertell Novak., MD;  Location: [facility];  Service: Endoscopy;  Laterality: N/A;  . INTRAMEDULLARY (IM) NAIL INTERTROCHANTERIC Right 12/13/2015   Procedure: INTRAMEDULLARY (IM) NAIL INTERTROCHANTRIC;  Surgeon: Samson Frederic, MD;  Location: [facility];  Service: Orthopedics;  Laterality: Right;  . RECTAL ULTRASOUND N/A 04/10/2014   Procedure: RECTAL ULTRASOUND;  Surgeon: Romie Levee, MD;  Location: [facility];  Service: Endoscopy;  Laterality: N/A;     Allergies  Allergies    Allergen Reactions  . Bactrim [Sulfamethoxazole-Trimethoprim] Other (See Comments)    Has chronic kidney disease.  This is not recommended by nephrologist.    . Contrast Media [Iodinated Diagnostic Agents] Other (See Comments)    Affected her labs for her kidneys   . Nsaids Other (See Comments)    Has chronic kidney disease.  This is not recommended by nephrologist.    History of Present Illness    Carla [name] is a 83 yo female with PMH of PAF, HTN, Hiatal hernia, PE and DVT, and hip fracture. She was admitted on 2/14 with AFib in the setting of urosepsis. Placed on amiodarone and converted to SR. Echo that admission showed mild RA, RV enlargement with moderate pulmonary HTN. Found to have a large hiatal hernia with esophagitis and gastritis in 7/14. Was then hospitalized again in 2/17 with dyspnea and LE edema. Dx with PE and right leg DVT and started on coumadin. Has IVC filter. Did have brief Afib on admission but then convert spontaneously. Had a fall and right hip fracture in 6/17. Was switched to Eliquis post op. She was last seen in the office on 9/17 by Dr. [place] and reported no bleeding issues. Noted to be in NSR and tolerating her medications well. On low dose Eliquis due to age and renal insufficiency.   Reports over the past 2 months she has had intermittent GI bleeding which she attributes to hemorrhoids. Had intermittently taking  her Eliquis during this time, but has recurrent bleeding every time she resumes it. She was seen by her PCP for weakness and dyspnea, found to have a Hgb 6.4 and advised to go to the ED and hold her Eliquis.   On admission her Hgb was 6, and she received 2 units of PRBCs. Other labs showed trop 0.04x3, WBC 16.3, TSH 2. CXR no acute findings. FOBT positive. GI was consulted, but patient felt that she could not tolerate oral prep for colonoscopy. Decision was made for conservative management, and planned to treat extensive hemorrhoids. Cardiology has been  called in regards to her long term anticoagulation.  Inpatient Medications    . amiodarone  100 mg Oral Daily  . docusate sodium  100 mg Oral QHS  . levothyroxine  75 mcg Oral QAC breakfast  . pantoprazole  40 mg Oral Q breakfast  . pramoxine   Rectal BID  . sodium chloride flush  3 mL Intravenous Q12H    Family History    Family History  Problem Relation Age of Onset  . Diabetes Father   . Leukemia Father   . Breast cancer Paternal Aunt   . Breast cancer Paternal Aunt   . Cancer      several family members on dad's side of family  . Heart failure Brother     Pt cannot confirm this history    Social History    Social History   Social History  . Marital status: Widowed    Spouse name: N/A  . Number of children: 4  . Years of education: N/A   Occupational History  . Retired Chartered loss adjuster    Social History Main Topics  . Smoking status: Never Smoker  . Smokeless tobacco: Never Used  . Alcohol use No  . Drug use: No  . Sexual activity: No   Other Topics Concern  . Not on file   Social History Narrative   Widowed.  Lives alone.  Uses a cane/walker to ambulate but independent of ADLs.     Review of Systems    General:  No chills, fever, night sweats or weight changes.  Cardiovascular:  No chest pain, dyspnea on exertion, edema, orthopnea, palpitations, paroxysmal nocturnal dyspnea. Dermatological: No rash, lesions/masses Respiratory: No cough, ++ dyspnea Urologic: No hematuria, dysuria Abdominal:  See HPI Neurologic:  No visual changes, wkns, changes in mental status. All other systems reviewed and are otherwise negative except as noted above.  Physical Exam    Blood pressure (!) 154/53, pulse 72, temperature 97.6 F (36.4 C), temperature source Oral, resp. rate (!) 32, height 5\' 2"  (1.575 m), weight 176 lb 2.4 oz (79.9 kg), SpO2 94 %.  General: Pleasant older WM, NAD Psych: Normal affect. Neuro: Alert and oriented X 3. Moves all extremities  spontaneously. HEENT: Normal  Neck: Supple without bruits or JVD. Lungs:  Resp regular and unlabored, CTA. Heart: RRR no s3, s4, or murmurs. Abdomen: Soft, non-tender, non-distended, BS + x 4.  Extremities: No clubbing, cyanosis or edema. DP/PT/Radials 2+ and equal bilaterally.  Labs    Troponin (Point of Care Test) No results for input(s): TROPIPOC in the last 72 hours.  Recent Labs  09/01/16 1248 09/01/16 1908 09/02/16 0035  TROPONINI 0.04* 0.04* 0.04*   Lab Results  Component Value Date   WBC 11.8 (H) 09/02/2016   HGB 8.0 (L) 09/02/2016   HCT 25.6 (L) 09/02/2016   MCV 77.1 (L) 09/02/2016   PLT 339 09/02/2016    Recent Labs  Lab 09/02/16 0343  NA 141  K 4.3  CL 108  CO2 27  BUN 16  CREATININE 1.32*  CALCIUM 8.2*  PROT 6.0*  BILITOT 0.8  ALKPHOS 52  ALT 8*  AST 13*  GLUCOSE 95   No results found for: CHOL, HDL, LDLCALC, TRIG No results found for: [facility]   Radiology Studies    Dg Chest 2 View  Result Date: 09/01/2016 CLINICAL DATA:  83 y/o female with anemia. Recent bleeding from hemorrhoids. Shortness of breath. Initial encounter. EXAM: CHEST  2 VIEW COMPARISON:  Chest CT 08/05/2016 and earlier. FINDINGS: Chronic large gastric hiatal hernia. Mediastinal contours appear stable. Calcified aortic atherosclerosis. Stable lung volumes. No pneumothorax, pulmonary edema or pleural effusion. There is atelectasis suspected associated with the mediastinal hernia, but no superimposed consolidation. Osteopenia. No acute osseous abnormality identified. IMPRESSION: 1.  No acute cardiopulmonary abnormality. 2. Large gastric hiatal hernia. 3.  Calcified aortic atherosclerosis. Electronically Signed   By: Odessa Fleming M.D.   On: 09/01/2016 13:57   Ct Chest Wo Contrast  Result Date: 08/05/2016 CLINICAL DATA:  Follow-up for pulmonary nodules. EXAM: CT CHEST WITHOUT CONTRAST TECHNIQUE: Multidetector CT imaging of the chest was performed following the standard protocol without IV  contrast. COMPARISON:  Prior chest CTs,  12/03/2015, 08/15/2015. FINDINGS: Cardiovascular: Heart is normal in size and configuration. There are stents coronary artery calcifications. The great vessels normal in caliber. Atherosclerotic calcifications are noted along the thoracic aorta and at the origin of the innominate and left subclavian arteries. Mediastinum/Nodes: Moderate size hiatal hernia. No mediastinal or hilar masses. No enlarged lymph nodes. No neck base or axillary masses or adenopathy. Trachea is unremarkable. Lungs/Pleura: Focal irregular opacity in the left apex measuring 11 x 7 mm in greatest transverse dimension, is stable, consistent with scarring. New area of peribronchovascular ground-glass opacity is noted in the left upper lobe measuring 22 x 19 x 10 mm. Subtle area of ground-glass opacity noted in the left upper lobe peripherally on the most recent prior study has resolved. There is a small area ground-glass opacity in the anteromedial left upper lobe, centered on image 70, series 3, measuring 12 x 11 mm stable calcified granuloma in the left lower lobe at the lateral costophrenic sulcus. No right sided nodules lower focal areas of opacity. No evidence pulmonary edema.  No pleural effusion.  No pneumothorax. Upper Abdomen: No acute abnormality. Musculoskeletal: No fracture or acute finding. No osteoblastic or osteolytic lesions. IMPRESSION: 1. There are 2 new areas of ground-glass opacity in the left upper lobe. These are most likely inflammatory in etiology. 2. Small area of ground-glass opacity seen in the peripheral left upper lobe on the most recent prior CT, which was centered on image 56, series 3, is no longer present. 3. Focal irregular opacity in the left upper lobe the apex, which is most consistent scarring, has been stable since the prior 2 exams. Given the findings of new ground-glass opacities in the left upper lobe, additional short-term follow-up is recommended. Non-contrast  chest CT at 3-6 months is recommended. If nodules persist, subsequent management will be based upon the most suspicious nodule(s). This recommendation follows the consensus statement: Guidelines for Management of Incidental Pulmonary Nodules Detected on CT Images: From the Fleischner Society 2017; Radiology 2017; 284:228-243. Electronically Signed   By: Amie Portland M.D.   On: 08/05/2016 12:26    ECG & Cardiac Imaging    EKG: N/A  Echo: 2/17  Study Conclusions  - Left ventricle: The cavity  size was normal. There was mild focal   basal hypertrophy of the septum. Systolic function was normal.   The estimated ejection fraction was in the range of 50% to 55%.   Wall motion was normal; there were no regional wall motion   abnormalities. Doppler parameters are consistent with abnormal   left ventricular relaxation (grade 1 diastolic dysfunction). - Aortic valve: There was trivial regurgitation. - Mitral valve: Calcified annulus. There was mild regurgitation. - Pulmonary arteries: Systolic pressure was mildly increased. PA   peak pressure: 34 mm Hg (S).  Impressions:  - Normal LV systolic function; grade 1 diastolic dysfunction; trace   AI; mild MR and TR; mildly elevated pulmonary pressure.  Assessment & Plan    83 yo female with PMH of PAF, HTN, Hiatal hernia, PE and DVT, and hip fracture who presented with rectal bleeding.   1. GI bleeding: Has been on coumadin in the past, but was stopped, then replaced with Eliquis. Hx of GI bleeding mayn years ago. Over the past 2 months has had intermittent episodes of bleeding, occurring after she resumes Eliquis. Hgb 6.0 on admission requiring 2 units of PRBCs.  -- Eliquis has been held. Seems appropriate to remain of anticoagulation at this time given the ongoing issue with recurrent GI bleeding. Risk currently outweigh any benefit she have have. GI consulted but no plans to pursue invasive testing at this time. Plan to treat hemorrhoids.    2. PAF: no Afib noted this admission on telemetry. -- This patients CHA2DS2-VASc Score and unadjusted Ischemic Stroke Rate (% per year) is equal to 4.8 % stroke rate/year from a score of 4  3. Elevated Trop: Flat non ACS trend in the setting of anemia.    Janice CoffinSigned, Lindsay Roberts, NP-C Pager [phone] 09/02/2016, 10:21 AM   I have examined the patient and reviewed assessment and plan and discussed with patient.  Agree with above as stated.  IVC filter in place.  Anticoagulation was restarted for PAF.  GIven bleeding issues, hold anticoagulation.  Now in NSR.  She has an appt with Dr. [place] in 3 weeks.  Based on her Hbg at that time, could reconsider anticoagulation at that time.   Lance MussJayadeep Varanasi

## 2016-09-03 DIAGNOSIS — D62 Acute posthemorrhagic anemia: Secondary | ICD-10-CM

## 2016-09-03 MED ORDER — IRON 325 (65 FE) MG PO TABS: 1.0000 | ORAL_TABLET | Freq: Two times a day (BID) | ORAL | 0 refills | Status: DC

## 2016-09-03 MED ORDER — PRAMOXINE HCL 1 % RE FOAM: Freq: Two times a day (BID) | RECTAL | 0 refills | Status: DC

## 2016-09-03 NOTE — Discharge Summary (Signed)
Physician Discharge Summary  Carla [name] ZOX:[id number] DOB: Mar 12, 1922 DOA: 09/01/2016  PCP: Eartha InchBADGER,MICHAEL C, MD  Admit date: 09/01/2016 Discharge date: 09/03/2016  Time spent: 35 minutes  Recommendations for Outpatient Follow-up:  1. Iron Rx this admit 2. Needs OP surgery input for hemmorhoids--To shceudle pruior to d/c and place on AVS 3. Elliquis stopped this admit--DO not re-start unless discussion with Cardiologist 4. Consider alternative Rx for Suppressive therapy for urinary infections given GFR <30  Discharge Diagnoses:  Principal Problem:   GI bleed Active Problems:   Iron deficiency anemia   Diverticulosis   Atrial fibrillation (HCC)   HLD (hyperlipidemia)   Discharge Condition: good  Diet recommendation: hh low salt  Filed Weights   09/01/16 0944 09/01/16 1505 09/02/16 1643  Weight: 78.5 kg (173 lb) 79.9 kg (176 lb 2.4 oz) 77.8 kg (171 lb 8.3 oz)    History of present illness:  3392 ? Atrial fibrillation [place] score 4 on chronic Elliquis             Previously on Coumadin, discontinued 11/2015 by [facility] Prior right intratrochanteric femur fracture status post repair 12/2015 Right leg DVT/acute submassive PE right heart strain 08/2015-IVC filter placed Large hiatal hernia + gastritis = high risk bleed             EGD 01/2013 ulcerative esophagitis and erosive gastritis in massive hiatal hernia Last colonoscopy 9/11 showed diffuse diverticulosis Rectovaginal, vesicovaginal fistula [facility]             Complications of recurrent UTIs secondary to fistula, pessary use Hypothyroidism Pulmonary hypertension based on echocardiogram 08/11/12  Had a visit to the emergency room 07/15/16 with rectal bleeding-requested to discharge home  Represented to emergency room hemoglobin found to be 6, patient requested to hold Elliquis and was transfused On admission troponin 0.04 BUN/creatinine 23/1.5 Iron level less than 5 saturation ratio was not  calculated  Hospital Course:  Acute GI bleed secondary to hemorrhoidal bleeding-holding anticoagulation. Has IVC filter to protect from DVT-->PE. However indications still remains given atrial fibrillation. Dr. Bosie ClosSchooler recommended topical management[prcotofoam/tucks] and discussion if symptoms persist with general surgery. Patient has indicated she does not wish colonic prep at this stage-continue Preparation H for the hemorrhoids and gastroenterology has recommended Proctofoam as well twice a day-tolerating reg diet on d/c Anemia of acute blood loss-hemoglobin has gone from 6--8 with 2 units of PRBC. Iron level is less than 5 so IV iron may be indicated in this setting.  Started PO iron this admit-recheck CBc/retic as f/u Transient elevation of troponin-trend is flat would not workup further at this juncture. Atrial fibrillation [place] score 4-holding Elliquis.  Hypotensive on admission.  Hypotension seems to have resolved with blood and saline. Right leg DVT, acute submassive PE right heart strain 08/2015 with IVC filter-need to discuss as an outpatient timing of removal of IVC filter if ever removable. For now hold off on anticoagulation. Rectovaginal, vesicovaginal fistulas, prolapse uterus-previously on Macrodantin 50 daily for suppressive therapy. Monitor--will resume the macrodantin 09/02/16--[place] need alternative agent going forward and GFR less than 30 Reflux continue Protonix 40 daily Hypothyroidism continue levothyroxine 75 daily-TSH is normal Constipation hold off of MiraLAX for now, Colace 100 daily at bedtime  Procedures: none Consultations:   cardiology varanasi  GI schooler  Discharge Exam: Vitals:   09/02/16 2200 09/03/16 0644  BP: (!) 160/66 (!) 160/53  Pulse:  74  Resp:  18  Temp:  98.1 F (36.7 C)    General:  eomim ncat pleasant hungry Cardiovascular: s1 s 2no m/r/g Respiratory: clear abd soft   Discharge Instructions   Discharge Instructions    Diet - low  sodium heart healthy    Complete by:  As directed    Discharge instructions    Complete by:  As directed    Use procotfoama nd other topical agents for hemmorrhoids Take iron tabs 2 x a day and get a blood count check soon Discuss the macrodantin for suppression of your urinary infections with your nephrologist DO NOT TAKE ELLIQUIS-Dr. [place] and you to have a discussion about this as an OP Should your bleeding worsen, would recommend that a surgeon see you in their office to consider banding of your hemorrhoids   Increase activity slowly    Complete by:  As directed      Current Discharge Medication List    START taking these medications   Details  Ferrous Sulfate (IRON) 325 (65 Fe) MG TABS Take 1 Dose by mouth 2 times daily at 12 noon and 4 pm. Qty: 60 each, Refills: 0    pramoxine (PROCTOFOAM) 1 % foam Place rectally 2 (two) times daily. Qty: 15 g, Refills: 0      CONTINUE these medications which have NOT CHANGED   Details  acetaminophen (TYLENOL) 650 MG CR tablet Take 650 mg by mouth every 8 (eight) hours as needed for pain.    amiodarone (PACERONE) 100 MG tablet Take 1 tablet (100 mg total) by mouth daily. Qty: 45 tablet, Refills: 1    Calcium Carb-Cholecalciferol (CALCIUM-VITAMIN D) 600-400 MG-UNIT TABS Take 1 tablet by mouth 2 (two) times daily.     guaiFENesin (MUCINEX) 600 MG 12 hr tablet Take 600 mg by mouth 2 (two) times daily as needed for cough.     hydroxypropyl methylcellulose / hypromellose (ISOPTO TEARS / GONIOVISC) 2.5 % ophthalmic solution Place 1 drop into both eyes 4 (four) times daily as needed for dry eyes.    ipratropium (ATROVENT) 0.06 % nasal spray Place 2 sprays into the nose 2 (two) times daily. Refills: 5    levothyroxine (SYNTHROID, LEVOTHROID) 75 MCG tablet Take 75 mcg by mouth daily before breakfast.    nitrofurantoin (MACRODANTIN) 50 MG capsule Take 50 mg by mouth daily with breakfast.     pantoprazole (PROTONIX) 40 MG tablet Take 40 mg by  mouth daily with breakfast.     phenylephrine-shark liver oil-mineral oil-petrolatum (PREPARATION H) 0.25-3-14-71.9 % rectal ointment Place 1 application rectally 2 (two) times daily as needed for hemorrhoids.    polyethylene glycol (MIRALAX / GLYCOLAX) packet Take by mouth daily as needed for mild constipation or moderate constipation.    witch hazel-glycerin (TUCKS) pad Apply 1 application topically as needed for hemorrhoids.      STOP taking these medications     apixaban (ELIQUIS) 2.5 MG TABS tablet      docusate sodium (COLACE) 100 MG capsule        Allergies  Allergen Reactions  . Bactrim [Sulfamethoxazole-Trimethoprim] Other (See Comments)    Has chronic kidney disease.  This is not recommended by nephrologist.    . Contrast Media [Iodinated Diagnostic Agents] Other (See Comments)    Affected her labs for her kidneys   . Nsaids Other (See Comments)    Has chronic kidney disease.  This is not recommended by nephrologist.      The results of significant diagnostics from this hospitalization (including imaging, microbiology, ancillary and laboratory) are listed below for reference.  Significant Diagnostic Studies: Dg Chest 2 View  Result Date: 09/01/2016 CLINICAL DATA:  83 y/o female with anemia. Recent bleeding from hemorrhoids. Shortness of breath. Initial encounter. EXAM: CHEST  2 VIEW COMPARISON:  Chest CT 08/05/2016 and earlier. FINDINGS: Chronic large gastric hiatal hernia. Mediastinal contours appear stable. Calcified aortic atherosclerosis. Stable lung volumes. No pneumothorax, pulmonary edema or pleural effusion. There is atelectasis suspected associated with the mediastinal hernia, but no superimposed consolidation. Osteopenia. No acute osseous abnormality identified. IMPRESSION: 1.  No acute cardiopulmonary abnormality. 2. Large gastric hiatal hernia. 3.  Calcified aortic atherosclerosis. Electronically Signed   By: Odessa Fleming M.D.   On: 09/01/2016 13:57   Ct Chest  Wo Contrast  Result Date: 08/05/2016 CLINICAL DATA:  Follow-up for pulmonary nodules. EXAM: CT CHEST WITHOUT CONTRAST TECHNIQUE: Multidetector CT imaging of the chest was performed following the standard protocol without IV contrast. COMPARISON:  Prior chest CTs,  12/03/2015, 08/15/2015. FINDINGS: Cardiovascular: Heart is normal in size and configuration. There are stents coronary artery calcifications. The great vessels normal in caliber. Atherosclerotic calcifications are noted along the thoracic aorta and at the origin of the innominate and left subclavian arteries. Mediastinum/Nodes: Moderate size hiatal hernia. No mediastinal or hilar masses. No enlarged lymph nodes. No neck base or axillary masses or adenopathy. Trachea is unremarkable. Lungs/Pleura: Focal irregular opacity in the left apex measuring 11 x 7 mm in greatest transverse dimension, is stable, consistent with scarring. New area of peribronchovascular ground-glass opacity is noted in the left upper lobe measuring 22 x 19 x 10 mm. Subtle area of ground-glass opacity noted in the left upper lobe peripherally on the most recent prior study has resolved. There is a small area ground-glass opacity in the anteromedial left upper lobe, centered on image 70, series 3, measuring 12 x 11 mm stable calcified granuloma in the left lower lobe at the lateral costophrenic sulcus. No right sided nodules lower focal areas of opacity. No evidence pulmonary edema.  No pleural effusion.  No pneumothorax. Upper Abdomen: No acute abnormality. Musculoskeletal: No fracture or acute finding. No osteoblastic or osteolytic lesions. IMPRESSION: 1. There are 2 new areas of ground-glass opacity in the left upper lobe. These are most likely inflammatory in etiology. 2. Small area of ground-glass opacity seen in the peripheral left upper lobe on the most recent prior CT, which was centered on image 56, series 3, is no longer present. 3. Focal irregular opacity in the left upper  lobe the apex, which is most consistent scarring, has been stable since the prior 2 exams. Given the findings of new ground-glass opacities in the left upper lobe, additional short-term follow-up is recommended. Non-contrast chest CT at 3-6 months is recommended. If nodules persist, subsequent management will be based upon the most suspicious nodule(s). This recommendation follows the consensus statement: Guidelines for Management of Incidental Pulmonary Nodules Detected on CT Images: From the Fleischner Society 2017; Radiology 2017; 284:228-243. Electronically Signed   By: Amie Portland M.D.   On: 08/05/2016 12:26    Microbiology: Recent Results (from the past 240 hour(s))  MRSA PCR Screening     Status: None   Collection Time: 09/01/16 11:03 PM  Result Value Ref Range Status   MRSA by PCR NEGATIVE NEGATIVE Final    Comment:        The GeneXpert MRSA Assay (FDA approved for NASAL specimens only), is one component of a comprehensive MRSA colonization surveillance program. It is not intended to diagnose MRSA infection nor to guide or  monitor treatment for MRSA infections.      Labs: Basic Metabolic Panel:  Recent Labs Lab 09/01/16 1035 09/01/16 1248 09/02/16 0343  NA 137  --  141  K 4.3  --  4.3  CL 104  --  108  CO2 26  --  27  GLUCOSE 115*  --  95  BUN 23*  --  16  CREATININE 1.53*  --  1.32*  CALCIUM 8.3*  --  8.2*  MG  --  1.9  --    Liver Function Tests:  Recent Labs Lab 09/02/16 0343  AST 13*  ALT 8*  ALKPHOS 52  BILITOT 0.8  PROT 6.0*  ALBUMIN 3.2*   No results for input(s): LIPASE, AMYLASE in the last 168 hours. No results for input(s): AMMONIA in the last 168 hours. CBC:  Recent Labs Lab 09/01/16 1035 09/01/16 2236 09/02/16 0343  WBC 16.3* 14.1* 11.8*  NEUTROABS 12.6*  --   --   HGB 6.0* 8.8* 8.0*  HCT 19.6* 28.6* 25.6*  MCV 74.5* 76.1* 77.1*  PLT 376 341 339   Cardiac Enzymes:  Recent Labs Lab 09/01/16 1248 09/01/16 1908 09/02/16 0035   TROPONINI 0.04* 0.04* 0.04*   BNP: BNP (last 3 results) No results for input(s): BNP in the last 8760 hours.  ProBNP (last 3 results) No results for input(s): PROBNP in the last 8760 hours.  CBG: No results for input(s): GLUCAP in the last 168 hours.     SignedRhetta Mura MD   [facility] 09/03/2016, 10:15 AM

## 2016-09-03 NOTE — Progress Notes (Signed)
Went over discharge paperwork with patient and family.  All questions answered.  Discharge summary and paperwork given to daughter.  VSS.  Surgery and cardiology appointment made and patient aware.

## 2016-09-16 NOTE — Progress Notes (Signed)
Carla [name] Date of Birth: December 05, 1921 Medical Record #[id number]  History of Present Illness: Carla [name] is seen today for followup Afib.  She was hospitalized in February 2014 with atrial fibrillation in the setting of urosepsis. She was placed on amiodarone and converted to normal sinus rhythm. Echocardiogram at that time showed mild right ventricular and right atrial enlargement. There was moderate pulmonary hypertension. In July 2014  Upper endoscopy demonstrated a massive hiatal hernia with esophagitis and gastritis. She does have a history of GI bleeding. She has been started on synthroid for hypothyroidism. She also has a history of vesicovaginal and vesicorectal fistulas. This is followed at [facility] and managed conservatively. She was hospitalized in February 2017 with increased SOB and leg swelling. She was diagnosed with a PE and right leg DVT. She was anticoagulated with  Coumadin.  She also has an IVC filter in place. She was noted to have Afib initially on admission but then converted to NSR. Echo  Showed normal LV function with mild pulmonary HTN.   She was admitted after a mechanical fall in June 2017 with a right hip fracture. She had an intramedullary nail placed. She was switched to Eliquis post op.   She was seen in the ED in early January with rectal bleeding. Hgb stable. She refused admission at that time. Eliquis was held for 3 days then resumed. Patient reported that every time she resumed Eliquis bleeding would start again. Presented February 28 with recurrent bleed and Hgb down to 6. She received 2 units of PRBCs. Other labs showed trop 0.04x3, WBC 16.3, TSH 2. CXR no acute findings. FOBT positive. GI was consulted, but patient felt that she could not tolerate oral prep for colonoscopy. Decision was made for conservative management, and planned to treat extensive hemorrhoids. Seen in consultation with [place] at that time with recommendation--"Seems appropriate to remain of  anticoagulation at this time given the ongoing issue with recurrent GI bleeding. Risk currently outweigh any benefit she have have. GI consulted but no plans to pursue invasive testing at this time. Plan to treat hemorrhoids."   On follow up today she is seen with her daughter. She is in a wheelchair. She denies any chest pain or palpitations. Does note more dyspnea but is very inactive- only walking to bathroom. Her leg swelling has resolved. She has no further bleeding issues.   Current Outpatient Prescriptions on File Prior to Visit  Medication Sig Dispense Refill  . acetaminophen (TYLENOL) 650 MG CR tablet Take 650 mg by mouth every 8 (eight) hours as needed for pain.    Marland Kitchen amiodarone (PACERONE) 100 MG tablet Take 1 tablet (100 mg total) by mouth daily. 45 tablet 1  . Calcium Carb-Cholecalciferol (CALCIUM-VITAMIN D) 600-400 MG-UNIT TABS Take 1 tablet by mouth 2 (two) times daily.     . Ferrous Sulfate (IRON) 325 (65 Fe) MG TABS Take 1 Dose by mouth 2 times daily at 12 noon and 4 pm. 60 each 0  . guaiFENesin (MUCINEX) 600 MG 12 hr tablet Take 600 mg by mouth 2 (two) times daily as needed for cough.     . hydroxypropyl methylcellulose / hypromellose (ISOPTO TEARS / GONIOVISC) 2.5 % ophthalmic solution Place 1 drop into both eyes 4 (four) times daily as needed for dry eyes.    Marland Kitchen levothyroxine (SYNTHROID, LEVOTHROID) 75 MCG tablet Take 75 mcg by mouth daily before breakfast.    . nitrofurantoin (MACRODANTIN) 50 MG capsule Take 50 mg by mouth daily with breakfast.     .  pantoprazole (PROTONIX) 40 MG tablet Take 40 mg by mouth daily with breakfast.     . phenylephrine-shark liver oil-mineral oil-petrolatum (PREPARATION H) 0.25-3-14-71.9 % rectal ointment Place 1 application rectally 2 (two) times daily as needed for hemorrhoids.    . polyethylene glycol (MIRALAX / GLYCOLAX) packet Take by mouth daily as needed for mild constipation or moderate constipation.    . pramoxine (PROCTOFOAM) 1 % foam Place  rectally 2 (two) times daily. 15 g 0  . witch hazel-glycerin (TUCKS) pad Apply 1 application topically as needed for hemorrhoids.     No current facility-administered medications on file prior to visit.     Allergies  Allergen Reactions  . Bactrim [Sulfamethoxazole-Trimethoprim] Other (See Comments)    Has chronic kidney disease.  This is not recommended by nephrologist.    . Contrast Media [Iodinated Diagnostic Agents] Other (See Comments)    Affected her labs for her kidneys   . Nsaids Other (See Comments)    Has chronic kidney disease.  This is not recommended by nephrologist.    Past Medical History:  Diagnosis Date  . Atrial fibrillation (HCC)   . Cataracts, bilateral   . Chronic kidney disease    chronic kidney disease  . Cystocele   . Diverticulosis   . Dysrhythmia    A- fib  . Encounter for fitting and adjustment of pessary 04/23/2002  . Female bladder prolapse   . Gallstones   . Hemorrhoids   . Hiatal hernia   . High cholesterol   . Hyperkalemia   . Hyperlipidemia   . Hypothyroidism   . Iron deficiency anemia   . Leukocytosis   . Lordosis   . Osteoarthritis   . Osteoarthritis   . Osteoporosis   . Pneumonia    hx aspiration pneumonia  . Rectocele   . Renal insufficiency   . Scoliosis   . Urinary tract infection without hematuria     Past Surgical History:  Procedure Laterality Date  . CATARACT EXTRACTION    . CHOLECYSTECTOMY    . COLONOSCOPY    . ENDOMETRIAL BIOPSY  09/2009  . ESOPHAGOGASTRODUODENOSCOPY    . ESOPHAGOGASTRODUODENOSCOPY N/A 01/09/2013   Procedure: ESOPHAGOGASTRODUODENOSCOPY (EGD);  Surgeon: Vertell Novak., MD;  Location: [facility];  Service: Endoscopy;  Laterality: N/A;  . INTRAMEDULLARY (IM) NAIL INTERTROCHANTERIC Right 12/13/2015   Procedure: INTRAMEDULLARY (IM) NAIL INTERTROCHANTRIC;  Surgeon: Samson Frederic, MD;  Location: [facility];  Service: Orthopedics;  Laterality: Right;  . RECTAL ULTRASOUND N/A 04/10/2014   Procedure:  RECTAL ULTRASOUND;  Surgeon: Romie Levee, MD;  Location: [facility];  Service: Endoscopy;  Laterality: N/A;    History  Smoking Status  . Never Smoker  Smokeless Tobacco  . Never Used    History  Alcohol Use No    Family History  Problem Relation Age of Onset  . Diabetes Father   . Leukemia Father   . Breast cancer Paternal Aunt   . Breast cancer Paternal Aunt   . Cancer      several family members on dad's side of family  . Heart failure Brother     Pt cannot confirm this history    Review of Systems: As noted in history of present illness.  All other systems were reviewed and are negative.  Physical Exam: BP (!) 174/85   Pulse 73   Ht 5\' 2"  (1.575 m)   Wt 173 lb 3.2 oz (78.6 kg)   SpO2 93%   BMI 31.68 kg/m  She is  a pleasant overweight white female in no acute distress. HEENT is unremarkable. Lungs: Clear Cardiovascular: Regular rate and rhythm without gallop, murmur, or click. Abdomen: Soft and nontender. Bowel sounds positive. No masses or bruits. Extremities: Pulses are 2+ and symmetric. She has 1+ right ankle edema. Neuro: Nonfocal.  LABORATORY DATA: Lab Results  Component Value Date   WBC 11.8 (H) 09/02/2016   HGB 8.0 (L) 09/02/2016   HCT 25.6 (L) 09/02/2016   PLT 339 09/02/2016   GLUCOSE 95 09/02/2016   ALT 8 (L) 09/02/2016   AST 13 (L) 09/02/2016   NA 141 09/02/2016   K 4.3 09/02/2016   CL 108 09/02/2016   CREATININE 1.32 (H) 09/02/2016   BUN 16 09/02/2016   CO2 27 09/02/2016   TSH 2.005 09/01/2016   INR 1.15 09/01/2016   HGBA1C 6.1 (H) 01/08/2013    Labs dated 09/16/16: Hgb 11.1. Creatinine 1.23. Glucose 140. Other chemistries normal.   Echo 09/02/16: Study Conclusions  - Left ventricle: The cavity size was normal. There was moderate   concentric hypertrophy. Systolic function was normal. The   estimated ejection fraction was in the range of 60% to 65%. There   is akinesis of the basal-midinferior myocardium. There was no    evidence of elevated ventricular filling pressure by Doppler   parameters. - Aortic valve: Trileaflet; mildly thickened, mildly calcified   leaflets. - Mitral valve: Calcified annulus. There was mild regurgitation. - Left atrium: The atrium was mildly dilated. Volume/bsa, S: 37.2   ml/m^2. - Pulmonary arteries: Systolic pressure was mildly increased. PA   peak pressure: 34 mm Hg (S).  I personally reviewed the Echo images and I see no akinetic area. This appears to be more the valve annulus in the short axis view.   Assessment / Plan: 1. Atrial fibrillation- paroxysmal. Only known occurrence in setting of acute PE and urosepsis. Now in NSR on amiodarone.  She is tolerating this well. Continue 100 mg daily. TSH and LFTs are up to date. I agree that with recurrent GI bleeding she is not a candidate for long term anticoagulation. I also recommend she not take ASA given her history of severe gastritis/esophagitis.  2. Hypertension-controlled.  3. Large hiatal hernia with history of  esophagitis and gastritis.  4. PE and right leg DVT- unprovoked.  IVC filter in place. Decision was made to leave filter in place since not a candidate for anticoagulation.  5. S/p right hip fracture with intramedullary nail placement.  I will follow up in 6 months.

## 2016-09-20 ENCOUNTER — Encounter: Payer: Self-pay | Admitting: Cardiology

## 2016-09-20 ENCOUNTER — Ambulatory Visit (INDEPENDENT_AMBULATORY_CARE_PROVIDER_SITE_OTHER): Payer: Medicare Other | Admitting: Cardiology

## 2016-09-20 VITALS — BP 174/85 | HR 73 | Ht 62.0 in | Wt 173.2 lb

## 2016-09-20 DIAGNOSIS — I2609 Other pulmonary embolism with acute cor pulmonale: Secondary | ICD-10-CM | POA: Diagnosis not present

## 2016-09-20 DIAGNOSIS — I480 Paroxysmal atrial fibrillation: Secondary | ICD-10-CM | POA: Diagnosis not present

## 2016-09-20 DIAGNOSIS — I10 Essential (primary) hypertension: Secondary | ICD-10-CM | POA: Diagnosis not present

## 2016-09-20 NOTE — Patient Instructions (Signed)
Continue your current therapy  I will see you in 6 months.   

## 2016-10-01 ENCOUNTER — Ambulatory Visit (HOSPITAL_COMMUNITY)
Admission: EM | Admit: 2016-10-01 | Discharge: 2016-10-01 | Disposition: A | Discharge status: Home / Self Care | Payer: Medicare Other | Attending: Internal Medicine | Admitting: Internal Medicine

## 2016-10-01 ENCOUNTER — Encounter (HOSPITAL_COMMUNITY): Payer: Self-pay | Admitting: Family Medicine

## 2016-10-01 DIAGNOSIS — K625 Hemorrhage of anus and rectum: Secondary | ICD-10-CM | POA: Diagnosis not present

## 2016-10-01 DIAGNOSIS — K649 Unspecified hemorrhoids: Secondary | ICD-10-CM

## 2016-10-01 LAB — POCT I-STAT, CHEM 8
BUN: 23 mg/dL — ABNORMAL HIGH (ref 6–20)
Calcium, Ion: 1.12 mmol/L — ABNORMAL LOW (ref 1.15–1.40)
Chloride: 102 mmol/L (ref 101–111)
Creatinine, Ser: 1.3 mg/dL — ABNORMAL HIGH (ref 0.44–1.00)
GLUCOSE: 116 mg/dL — AB (ref 65–99)
HEMATOCRIT: 39 % (ref 36.0–46.0)
HEMOGLOBIN: 13.3 g/dL (ref 12.0–15.0)
POTASSIUM: 5.1 mmol/L (ref 3.5–5.1)
SODIUM: 139 mmol/L (ref 135–145)
TCO2: 31 mmol/L (ref 0–100)

## 2016-10-01 NOTE — Discharge Instructions (Signed)
If you develop bleeding between bowel movements or feel specially weak and tired seek medical attention promptly. Use the topical medicines you have at home for hemorrhoids. Make sure you call the surgeon for an appointment.

## 2016-10-01 NOTE — ED Triage Notes (Signed)
Pt here for rectal bleeding. Pt has known hemorrhoids and has been seen multiple times for the same at the ER. sts she has had 4 hard BMs since last night after taking metamucil. sts the bleeding is bright red and only with BM. Daughter just wants to get her Hgb checked because it has been low before. sts she is currently not on her blood thinners anymore. Pt color good and appears well.

## 2016-10-01 NOTE — ED Provider Notes (Signed)
CSN: [id number]     Arrival date & time 10/01/16  1756 History   First MD Initiated Contact with Patient 10/01/16 1853     Chief Complaint  Patient presents with  . Rectal Bleeding   (Consider location/radiation/quality/duration/timing/severity/associated sxs/prior Treatment) Well-preserved fully awake alert and oriented 83 year old female is accompanied by her daughter with a complaint of recent rectal bleeding. She has a long history of rectal bleeding associated with hemorrhoids. Several weeks ago she was admitted to the hospital for rectal bleeding and evaluation showed that it was due to hemorrhoids. She also has been on Eliquis. That has since been stopped and she has not had bleeding in the past several weeks. She was recently little constipated so she took some MiraLAX and she has had 4 bowel movements since then each bowel movement is associated with bright red rectal bleeding. She has no bleeding in between bowel movements. She states she feels generally well she does not feel weak she does not have chest pain or any increase in shortness of breath above her baseline. Primarily, her daughter is requesting an H&H to make sure that she is not too anemic. Meds at home include prescription topicals and Proctofoam which seemed to help.  Patient prefers not to have to get out of the chair and onto the table for rectal examination as she knows that she has chronic hemorrhoidal bleeding. This is been diagnosed by several physicians including gastroenterologist. She missed an appointment with the surgeon because she had not had any more bleeding but now plans on making another appointment.      Past Medical History:  Diagnosis Date  . Atrial fibrillation (HCC)   . Cataracts, bilateral   . Chronic kidney disease    chronic kidney disease  . Cystocele   . Diverticulosis   . Dysrhythmia    A- fib  . Encounter for fitting and adjustment of pessary 04/23/2002  . Female bladder prolapse   .  Gallstones   . Hemorrhoids   . Hiatal hernia   . High cholesterol   . Hyperkalemia   . Hyperlipidemia   . Hypothyroidism   . Iron deficiency anemia   . Leukocytosis   . Lordosis   . Osteoarthritis   . Osteoarthritis   . Osteoporosis   . Pneumonia    hx aspiration pneumonia  . Rectocele   . Renal insufficiency   . Scoliosis   . Urinary tract infection without hematuria    Past Surgical History:  Procedure Laterality Date  . CATARACT EXTRACTION    . CHOLECYSTECTOMY    . COLONOSCOPY    . ENDOMETRIAL BIOPSY  09/2009  . ESOPHAGOGASTRODUODENOSCOPY    . ESOPHAGOGASTRODUODENOSCOPY N/A 01/09/2013   Procedure: ESOPHAGOGASTRODUODENOSCOPY (EGD);  Surgeon: Vertell Novak., MD;  Location: [facility];  Service: Endoscopy;  Laterality: N/A;  . INTRAMEDULLARY (IM) NAIL INTERTROCHANTERIC Right 12/13/2015   Procedure: INTRAMEDULLARY (IM) NAIL INTERTROCHANTRIC;  Surgeon: Samson Frederic, MD;  Location: [facility];  Service: Orthopedics;  Laterality: Right;  . RECTAL ULTRASOUND N/A 04/10/2014   Procedure: RECTAL ULTRASOUND;  Surgeon: Romie Levee, MD;  Location: [facility];  Service: Endoscopy;  Laterality: N/A;   Family History  Problem Relation Age of Onset  . Diabetes Father   . Leukemia Father   . Breast cancer Paternal Aunt   . Breast cancer Paternal Aunt   . Cancer      several family members on dad's side of family  . Heart failure Brother     Pt  cannot confirm this history   Social History  Substance Use Topics  . Smoking status: Never Smoker  . Smokeless tobacco: Never Used  . Alcohol use No   OB History    Gravida Para Term Preterm AB Living   SAB TAB Ectopic Multiple Live Births                 Review of Systems  Constitutional: Negative.   Respiratory: Negative.   Gastrointestinal: Positive for blood in stool.  Genitourinary: Negative.   Musculoskeletal: Negative.   All other systems reviewed and are negative.   Allergies  Bactrim  [sulfamethoxazole-trimethoprim]; Contrast media [iodinated diagnostic agents]; and Nsaids  Home Medications   Prior to Admission medications   Medication Sig Start Date End Date Taking? Authorizing Provider  acetaminophen (TYLENOL) 650 MG CR tablet Take 650 mg by mouth every 8 (eight) hours as needed for pain.    Historical Provider, MD  amiodarone (PACERONE) 100 MG tablet Take 1 tablet (100 mg total) by mouth daily. 05/12/16   Peter M [place], MD  Calcium Carb-Cholecalciferol (CALCIUM-VITAMIN D) 600-400 MG-UNIT TABS Take 1 tablet by mouth 2 (two) times daily.     Historical Provider, MD  Ferrous Sulfate (IRON) 325 (65 Fe) MG TABS Take 1 Dose by mouth 2 times daily at 12 noon and 4 pm. 09/03/16   Rhetta Mura, MD  guaiFENesin (MUCINEX) 600 MG 12 hr tablet Take 600 mg by mouth 2 (two) times daily as needed for cough.     Historical Provider, MD  hydroxypropyl methylcellulose / hypromellose (ISOPTO TEARS / GONIOVISC) 2.5 % ophthalmic solution Place 1 drop into both eyes 4 (four) times daily as needed for dry eyes.    Historical Provider, MD  levothyroxine (SYNTHROID, LEVOTHROID) 75 MCG tablet Take 75 mcg by mouth daily before breakfast.    Historical Provider, MD  nitrofurantoin (MACRODANTIN) 50 MG capsule Take 50 mg by mouth daily with breakfast.  12/27/14   Historical Provider, MD  pantoprazole (PROTONIX) 40 MG tablet Take 40 mg by mouth daily with breakfast.  01/10/13   Dorothea Ogle, MD  phenylephrine-shark liver oil-mineral oil-petrolatum (PREPARATION H) 0.25-3-14-71.9 % rectal ointment Place 1 application rectally 2 (two) times daily as needed for hemorrhoids.    Historical Provider, MD  polyethylene glycol (MIRALAX / GLYCOLAX) packet Take by mouth daily as needed for mild constipation or moderate constipation.    Historical Provider, MD  pramoxine (PROCTOFOAM) 1 % foam Place rectally 2 (two) times daily. 09/03/16   Rhetta Mura, MD  witch hazel-glycerin (TUCKS) pad Apply 1 application  topically as needed for hemorrhoids.    Historical Provider, MD   Meds Ordered and Administered this Visit  Medications - No data to display  BP (!) 141/111   Pulse 90   Temp 97.9 F (36.6 C)   Resp 18   SpO2 95%  No data found.   Physical Exam  Constitutional: She is oriented to person, place, and time. She appears well-developed and well-nourished. No distress.  Eyes: EOM are normal.  Neck: Normal range of motion. Neck supple.  Cardiovascular: Normal rate.   Pulmonary/Chest: Effort normal. No respiratory distress.  Musculoskeletal: She exhibits no edema.  Neurological: She is alert and oriented to person, place, and time. She exhibits normal muscle tone.  Skin: Skin is warm and dry.  Psychiatric: She has a normal mood and affect. Her behavior is normal.  Nursing note and vitals  reviewed.   Urgent Care Course     Procedures (including critical care time)  Labs Review Labs Reviewed  POCT I-STAT, CHEM 8 - Abnormal; Notable for the following:       Result Value   BUN 23 (*)    Creatinine, Ser 1.30 (*)    Glucose, Bld 116 (*)    Calcium, Ion 1.12 (*)    All other components within normal limits    Imaging Review No results found.   Visual Acuity Review  Right Eye Distance:   Left Eye Distance:   Bilateral Distance:    Right Eye Near:   Left Eye Near:    Bilateral Near:         MDM   1. Rectal bleeding   2. Hemorrhoids, unspecified hemorrhoid type    If you develop bleeding between bowel movements or feel specially weak and tired seek medical attention promptly. Use the topical medicines you have at home for hemorrhoids. Make sure you call the surgeon for an appointment.     Hayden Rasmussen, NP 10/01/16 [phone]

## 2016-10-18 ENCOUNTER — Ambulatory Visit: Payer: Medicare Other | Admitting: Physician Assistant

## 2016-10-19 ENCOUNTER — Encounter: Payer: Self-pay | Admitting: Physician Assistant

## 2016-10-19 ENCOUNTER — Ambulatory Visit (INDEPENDENT_AMBULATORY_CARE_PROVIDER_SITE_OTHER): Payer: Medicare Other | Admitting: Physician Assistant

## 2016-10-19 VITALS — BP 124/72 | HR 83 | Temp 97.8°F | Resp 14 | Ht 62.0 in | Wt 169.0 lb

## 2016-10-19 DIAGNOSIS — D62 Acute posthemorrhagic anemia: Secondary | ICD-10-CM | POA: Diagnosis not present

## 2016-10-19 DIAGNOSIS — I480 Paroxysmal atrial fibrillation: Secondary | ICD-10-CM

## 2016-10-19 DIAGNOSIS — E038 Other specified hypothyroidism: Secondary | ICD-10-CM

## 2016-10-19 DIAGNOSIS — I2609 Other pulmonary embolism with acute cor pulmonale: Secondary | ICD-10-CM

## 2016-10-19 LAB — CBC
MCHC: 30.7 g/dL (ref 30.0–36.0)
MCV: 82.8 fl (ref 78.0–100.0)
PLATELETS: 243 10*3/uL (ref 150.0–400.0)
RBC: 4.93 Mil/uL (ref 3.87–5.11)
RDW: 24.8 % — ABNORMAL HIGH (ref 11.5–15.5)
WBC: 12.9 10*3/uL — ABNORMAL HIGH (ref 4.0–10.5)

## 2016-10-19 LAB — VITAMIN B12: Vitamin B-12: 274 pg/mL (ref 211–911)

## 2016-10-19 NOTE — Patient Instructions (Signed)
Please go to the lab for blood work.  I will call you with your results and we will alter medications accordingly.  Please follow-up with specialists as directed. Start some saline nasal rinse to help flush out nasal passages.  Please schedule an appointment for a complete physical.  Welcome to [facility]!

## 2016-10-19 NOTE — Progress Notes (Signed)
Patient presents to clinic today with her daughter to establish care.  Acute Concerns: Deny acute concerns today.  Chronic Issues: Atrial Fibrillation -- Followed by Cardiology (Dr. Martinique). Is currently on regimen of Pacerone 100 mg daily. Is not currently on blood thinners due to history of recurrent GI bleeds.   BP Readings from Last 3 Encounters:  10/19/16 124/72  10/01/16 (!) 141/111  09/20/16 (!) 174/85   Anemia -- Followed by Gastroenterology Sadie Haber) due to history of recurrent GI bleeds causing IDA. Not on blood thinners despite significant cardiopulmonary history due to her significant bleeding risk. Is currently on once daily iron supplement given by Cardiology. Is due for repeat CBC, etc. Denies melena, hematochezia at present. Keeps a close eye on this. Denies chest pain, SOB, lightheadedness or dizziness.  Hx of Pulmonary Embolism -- Followed by Cardiology and Pulmonology Ashok Cordia). No anticoagulation due to significant history of bleeds.   Hypothyroidism -- Secondary to chronic amiodarone. Currently on levothyroxine 75 mcg daily. Is taking as directed. Is due for TSH check.  Health Maintenance: Immunizations -- Unsure of Pneumonia. Will obtain full records. Declines TDaP today. Bone Density -- up-to-date  Past Medical History:  Diagnosis Date  . Atrial fibrillation ([place])   . Cataracts, bilateral   . Chronic kidney disease    chronic kidney disease  . Cystocele   . Diverticulosis   . Dysrhythmia    A- fib  . Encounter for fitting and adjustment of pessary 04/23/2002  . Female bladder prolapse   . Gallstones   . Hemorrhoids   . Hiatal hernia   . High cholesterol   . Hyperkalemia   . Hyperlipidemia   . Hypothyroidism   . Iron deficiency anemia   . Leukocytosis   . Lordosis   . Osteoarthritis   . Osteoarthritis   . Osteoporosis   . Pneumonia    hx aspiration pneumonia  . Rectocele   . Renal insufficiency   . Scoliosis   . Urinary tract infection  without hematuria     Past Surgical History:  Procedure Laterality Date  . CATARACT EXTRACTION    . CHOLECYSTECTOMY    . COLONOSCOPY    . ENDOMETRIAL BIOPSY  09/2009  . ESOPHAGOGASTRODUODENOSCOPY    . ESOPHAGOGASTRODUODENOSCOPY N/A 01/09/2013   Procedure: ESOPHAGOGASTRODUODENOSCOPY (EGD);  Surgeon: Winfield Cunas., MD;  Location: [facility];  Service: Endoscopy;  Laterality: N/A;  . INTRAMEDULLARY (IM) NAIL INTERTROCHANTERIC Right 12/13/2015   Procedure: INTRAMEDULLARY (IM) NAIL INTERTROCHANTRIC;  Surgeon: Rod Can, MD;  Location: [facility];  Service: Orthopedics;  Laterality: Right;  . RECTAL ULTRASOUND N/A 04/10/2014   Procedure: RECTAL ULTRASOUND;  Surgeon: Leighton Ruff, MD;  Location: [facility];  Service: Endoscopy;  Laterality: N/A;    Current Outpatient Prescriptions on File Prior to Visit  Medication Sig Dispense Refill  . acetaminophen (TYLENOL) 650 MG CR tablet Take 650 mg by mouth every 8 (eight) hours as needed for pain.    Marland Kitchen amiodarone (PACERONE) 100 MG tablet Take 1 tablet (100 mg total) by mouth daily. 45 tablet 1  . Calcium Carb-Cholecalciferol (CALCIUM-VITAMIN D) 600-400 MG-UNIT TABS Take 1 tablet by mouth 2 (two) times daily.     . Ferrous Sulfate (IRON) 325 (65 Fe) MG TABS Take 1 Dose by mouth 2 times daily at 12 noon and 4 pm. 60 each 0  . guaiFENesin (MUCINEX) 600 MG 12 hr tablet Take 600 mg by mouth 2 (two) times daily as needed for cough.     . hydroxypropyl  methylcellulose / hypromellose (ISOPTO TEARS / GONIOVISC) 2.5 % ophthalmic solution Place 1 drop into both eyes 4 (four) times daily as needed for dry eyes.    Marland Kitchen levothyroxine (SYNTHROID, LEVOTHROID) 75 MCG tablet Take 75 mcg by mouth daily before breakfast.    . nitrofurantoin (MACRODANTIN) 50 MG capsule Take 50 mg by mouth daily with breakfast.     . pantoprazole (PROTONIX) 40 MG tablet Take 40 mg by mouth daily with breakfast.     . phenylephrine-shark liver oil-mineral oil-petrolatum (PREPARATION H)  0.25-3-14-71.9 % rectal ointment Place 1 application rectally 2 (two) times daily as needed for hemorrhoids.    . polyethylene glycol (MIRALAX / GLYCOLAX) packet Take by mouth daily as needed for mild constipation or moderate constipation.    . pramoxine (PROCTOFOAM) 1 % foam Place rectally 2 (two) times daily. 15 g 0  . witch hazel-glycerin (TUCKS) pad Apply 1 application topically as needed for hemorrhoids.     No current facility-administered medications on file prior to visit.     Allergies  Allergen Reactions  . Bactrim [Sulfamethoxazole-Trimethoprim] Other (See Comments)    Has chronic kidney disease.  This is not recommended by nephrologist.    . Contrast Media [Iodinated Diagnostic Agents] Other (See Comments)    Affected her labs for her kidneys   . Nsaids Other (See Comments)    Has chronic kidney disease.  This is not recommended by nephrologist.    Family History  Problem Relation Age of Onset  . Diabetes Father   . Leukemia Father   . Breast cancer Paternal Aunt   . Breast cancer Paternal Aunt   . Cancer      several family members on dad's side of family  . Heart failure Brother     Pt cannot confirm this history    Social History   Social History  . Marital status: Widowed    Spouse name: N/A  . Number of children: 4  . Years of education: N/A   Occupational History  . Retired Chartered loss adjuster    Social History Main Topics  . Smoking status: Never Smoker  . Smokeless tobacco: Never Used  . Alcohol use No  . Drug use: No  . Sexual activity: No   Other Topics Concern  . Not on file   Social History Narrative   Widowed.  Lives alone.  Uses a cane/walker to ambulate but independent of ADLs.   ROS  There were no vitals taken for this visit.  Physical Exam  Recent Results (from the past 2160 hour(s))  Type and screen [facility]     Status: None   Collection Time: 09/01/16 10:32 AM  Result Value Ref Range   ABO/RH(D) O NEG     Antibody Screen NEG    Sample Expiration 09/04/2016    Extend sample reason NO TRANSFUSIONS OR PREGNANCY IN THE PAST 3 MONTHS    Unit Number [id number]    Blood Component Type RED CELLS,LR    Unit division 00    Status of Unit ISSUED,FINAL    Transfusion Status OK TO TRANSFUSE    Crossmatch Result Compatible    Unit Number [id number]    Blood Component Type RED CELLS,LR    Unit division 00    Status of Unit ISSUED,FINAL    Transfusion Status OK TO TRANSFUSE    Crossmatch Result Compatible   BPAM RBC     Status: None   Collection Time: 09/01/16 10:32 AM  Result Value Ref  Range   ISSUE DATE / TIME [id number]    Blood Product Unit Number [id number]    PRODUCT CODE E0336V00    Unit Type and Rh 9500    Blood Product Expiration Date 790240973532    ISSUE DATE / TIME [id number]    Blood Product Unit Number [id number]    PRODUCT CODE E0336V00    Unit Type and Rh 9500    Blood Product Expiration Date [id number]   CBC with Differential/Platelet     Status: Abnormal   Collection Time: 09/01/16 10:35 AM  Result Value Ref Range   WBC 16.3 (H) 4.0 - 10.5 K/uL   RBC 2.63 (L) 3.87 - 5.11 MIL/uL   Hemoglobin 6.0 (LL) 12.0 - 15.0 g/dL    Comment: REPEATED TO VERIFY CRITICAL RESULT CALLED TO, READ BACK BY AND VERIFIED WITH: HAMBY,M. RN AT 1118 ON 02.28.18 BY COHEN,K    HCT 19.6 (L) 36.0 - 46.0 %   MCV 74.5 (L) 78.0 - 100.0 fL   MCH 22.8 (L) 26.0 - 34.0 pg   MCHC 30.6 30.0 - 36.0 g/dL   RDW 16.6 (H) 11.5 - 15.5 %   Platelets 376 150 - 400 K/uL   Neutrophils Relative % 77 %   Lymphocytes Relative 8 %   Monocytes Relative 15 %   Eosinophils Relative 0 %   Basophils Relative 0 %   Neutro Abs 12.6 (H) 1.7 - 7.7 K/uL   Lymphs Abs 1.3 0.7 - 4.0 K/uL   Monocytes Absolute 2.4 (H) 0.1 - 1.0 K/uL   Eosinophils Absolute 0.0 0.0 - 0.7 K/uL   Basophils Absolute 0.0 0.0 - 0.1 K/uL   Smear Review LARGE PLATELETS PRESENT   Basic metabolic panel     Status: Abnormal   Collection  Time: 09/01/16 10:35 AM  Result Value Ref Range   Sodium 137 135 - 145 mmol/L   Potassium 4.3 3.5 - 5.1 mmol/L   Chloride 104 101 - 111 mmol/L   CO2 26 22 - 32 mmol/L   Glucose, Bld 115 (H) 65 - 99 mg/dL   BUN 23 (H) 6 - 20 mg/dL   Creatinine, Ser 1.53 (H) 0.44 - 1.00 mg/dL   Calcium 8.3 (L) 8.9 - 10.3 mg/dL   GFR calc non Af Amer 28 (L) >60 mL/min   GFR calc Af Amer 33 (L) >60 mL/min    Comment: (NOTE) The eGFR has been calculated using the CKD EPI equation. This calculation has not been validated in all clinical situations. eGFR's persistently <60 mL/min signify possible Chronic Kidney Disease.    Anion gap 7 5 - 15  Vitamin B12     Status: None   Collection Time: 09/01/16 10:35 AM  Result Value Ref Range   Vitamin B-12 198 180 - 914 pg/mL    Comment: Performed at [facility] [address] [place], [place] [address]  Folate     Status: None   Collection Time: 09/01/16 10:35 AM  Result Value Ref Range   Folate 27.3 >5.9 ng/mL    Comment: Performed at [facility] [address] [place], [place] [address]  Iron and TIBC     Status: Abnormal   Collection Time: 09/01/16 10:35 AM  Result Value Ref Range   Iron <5 (L) 28 - 170 ug/dL    Comment: REPEATED TO VERIFY   TIBC 330 250 - 450 ug/dL   Saturation Ratios NOT CALCULATED 10.4 - 31.8 %    Comment: IRON <5  UIBC NOT CALCULATED ug/dL    Comment: IRON <5 Performed at [facility], [place] [address] [place], [place] [address]   Ferritin     Status: Abnormal   Collection Time: 09/01/16 10:35 AM  Result Value Ref Range   Ferritin 5 (L) 11 - 307 ng/mL    Comment: Performed at [facility], [place] [address] [place], [place] [address]  Reticulocytes     Status: Abnormal   Collection Time: 09/01/16 10:35 AM  Result Value Ref Range   Retic Ct Pct 3.2 (H) 0.4 - 3.1 %   RBC. 2.63 (L) 3.87 - 5.11 MIL/uL   Retic Count, Manual 84.2 19.0 - 186.0 K/uL  APTT     Status: None   Collection Time: 09/01/16  10:35 AM  Result Value Ref Range   aPTT 30 24 - 36 seconds  Protime-INR     Status: None   Collection Time: 09/01/16 10:35 AM  Result Value Ref Range   Prothrombin Time 14.7 11.4 - 15.2 seconds   INR 1.15   T4, free     Status: Abnormal   Collection Time: 09/01/16 10:35 AM  Result Value Ref Range   Free T4 1.47 (H) 0.61 - 1.12 ng/dL    Comment: (NOTE) Biotin ingestion may interfere with free T4 tests. If the results are inconsistent with the TSH level, previous test results, or the clinical presentation, then consider biotin interference. If needed, order repeat testing after stopping biotin. Performed at [facility], [place] [address] [place], [place] [address]   POC occult blood, ED RN will collect     Status: Abnormal   Collection Time: 09/01/16 11:23 AM  Result Value Ref Range   Fecal Occult Bld POSITIVE (A) NEGATIVE  Prepare RBC     Status: None   Collection Time: 09/01/16 11:47 AM  Result Value Ref Range   Order Confirmation ORDER PROCESSED BY BLOOD BANK   Magnesium     Status: None   Collection Time: 09/01/16 12:48 PM  Result Value Ref Range   Magnesium 1.9 1.7 - 2.4 mg/dL  TSH     Status: None   Collection Time: 09/01/16 12:48 PM  Result Value Ref Range   TSH 2.005 0.350 - 4.500 uIU/mL    Comment: Performed by a 3rd Generation assay with a functional sensitivity of <=0.01 uIU/mL.  Troponin I     Status: Abnormal   Collection Time: 09/01/16 12:48 PM  Result Value Ref Range   Troponin I 0.04 (HH) <0.03 ng/mL    Comment: CRITICAL RESULT CALLED TO, READ BACK BY AND VERIFIED WITH: HAMBY,M @ 1337 ON [id number] BY POTEAT,S   Troponin I     Status: Abnormal   Collection Time: 09/01/16  7:08 PM  Result Value Ref Range   Troponin I 0.04 (HH) <0.03 ng/mL    Comment: CRITICAL VALUE NOTED.  VALUE IS CONSISTENT WITH PREVIOUSLY REPORTED AND CALLED VALUE.  CBC     Status: Abnormal   Collection Time: 09/01/16 10:36 PM  Result Value Ref Range   WBC 14.1 (H) 4.0 - 10.5 K/uL     RBC 3.76 (L) 3.87 - 5.11 MIL/uL   Hemoglobin 8.8 (L) 12.0 - 15.0 g/dL    Comment: DELTA CHECK NOTED POST TRANSFUSION SPECIMEN REPEATED TO VERIFY    HCT 28.6 (L) 36.0 - 46.0 %   MCV 76.1 (L) 78.0 - 100.0 fL   MCH 23.4 (L) 26.0 - 34.0 pg   MCHC 30.8 30.0 -  36.0 g/dL   RDW 18.3 (H) 11.5 - 15.5 %   Platelets 341 150 - 400 K/uL  MRSA PCR Screening     Status: None   Collection Time: 09/01/16 11:03 PM  Result Value Ref Range   MRSA by PCR NEGATIVE NEGATIVE    Comment:        The GeneXpert MRSA Assay (FDA approved for NASAL specimens only), is one component of a comprehensive MRSA colonization surveillance program. It is not intended to diagnose MRSA infection nor to guide or monitor treatment for MRSA infections.   Troponin I     Status: Abnormal   Collection Time: 09/02/16 12:35 AM  Result Value Ref Range   Troponin I 0.04 (HH) <0.03 ng/mL    Comment: CRITICAL VALUE NOTED.  VALUE IS CONSISTENT WITH PREVIOUSLY REPORTED AND CALLED VALUE.  Comprehensive metabolic panel     Status: Abnormal   Collection Time: 09/02/16  3:43 AM  Result Value Ref Range   Sodium 141 135 - 145 mmol/L   Potassium 4.3 3.5 - 5.1 mmol/L   Chloride 108 101 - 111 mmol/L   CO2 27 22 - 32 mmol/L   Glucose, Bld 95 65 - 99 mg/dL   BUN 16 6 - 20 mg/dL   Creatinine, Ser 1.32 (H) 0.44 - 1.00 mg/dL   Calcium 8.2 (L) 8.9 - 10.3 mg/dL   Total Protein 6.0 (L) 6.5 - 8.1 g/dL   Albumin 3.2 (L) 3.5 - 5.0 g/dL   AST 13 (L) 15 - 41 U/L   ALT 8 (L) 14 - 54 U/L   Alkaline Phosphatase 52 38 - 126 U/L   Total Bilirubin 0.8 0.3 - 1.2 mg/dL   GFR calc non Af Amer 34 (L) >60 mL/min   GFR calc Af Amer 39 (L) >60 mL/min    Comment: (NOTE) The eGFR has been calculated using the CKD EPI equation. This calculation has not been validated in all clinical situations. eGFR's persistently <60 mL/min signify possible Chronic Kidney Disease.    Anion gap 6 5 - 15  CBC     Status: Abnormal   Collection Time: 09/02/16  3:43 AM   Result Value Ref Range   WBC 11.8 (H) 4.0 - 10.5 K/uL   RBC 3.32 (L) 3.87 - 5.11 MIL/uL   Hemoglobin 8.0 (L) 12.0 - 15.0 g/dL   HCT 25.6 (L) 36.0 - 46.0 %   MCV 77.1 (L) 78.0 - 100.0 fL   MCH 24.1 (L) 26.0 - 34.0 pg   MCHC 31.3 30.0 - 36.0 g/dL   RDW 18.6 (H) 11.5 - 15.5 %   Platelets 339 150 - 400 K/uL  Echocardiogram     Status: None   Collection Time: 09/02/16 12:44 PM  Result Value Ref Range   Weight 2,818.36 oz   Height 62 in   BP 154/53 mmHg  I-STAT, chem 8     Status: Abnormal   Collection Time: 10/01/16  7:13 PM  Result Value Ref Range   Sodium 139 135 - 145 mmol/L   Potassium 5.1 3.5 - 5.1 mmol/L   Chloride 102 101 - 111 mmol/L   BUN 23 (H) 6 - 20 mg/dL   Creatinine, Ser 1.30 (H) 0.44 - 1.00 mg/dL   Glucose, Bld 116 (H) 65 - 99 mg/dL   Calcium, Ion 1.12 (L) 1.15 - 1.40 mmol/L   TCO2 31 0 - 100 mmol/L   Hemoglobin 13.3 12.0 - 15.0 g/dL   HCT 39.0 36.0 - 46.0 %  Assessment/Plan: 1. Other specified hypothyroidism Repeat labs today. Will alter levothyroxine based on results.  - B12 - TSH; Future - TSH  2. Acute blood loss anemia Repeat labs today. Continue daily iron supplementation for now. Follow-up with GI as scheduled. Alarm signs/symptoms reviewed with patient and daughter.  - B12 - CBC  3. Paroxysmal atrial fibrillation ([place]) Followed by Cardiology. BP good today. No anticoagulation due to bleeding risk. Will defer further changes in this to GI or Cardiology.  4. Other pulmonary embolism with acute cor pulmonale, unspecified chronicity (HCC) Risk of recurrence due to atrial fibrillation. Patient is aware of this but is also aware of the great risk of further bleeds with thinners giving her history. No change for now. Follow-up with Pulmonology as scheduled.   Leeanne Rio, PA-C

## 2016-10-19 NOTE — Progress Notes (Signed)
Pre visit review using our clinic review tool, if applicable. No additional management support is needed unless otherwise documented below in the visit note. 

## 2016-10-20 ENCOUNTER — Other Ambulatory Visit: Payer: Self-pay | Admitting: Emergency Medicine

## 2016-10-20 LAB — TSH: TSH: 2.3 u[IU]/mL (ref 0.35–4.50)

## 2016-10-20 MED ORDER — IRON 325 (65 FE) MG PO TABS: 1.0000 | ORAL_TABLET | Freq: Every day | ORAL | 6 refills | Status: AC

## 2016-11-03 ENCOUNTER — Encounter: Payer: Medicare Other | Admitting: Physician Assistant

## 2016-11-03 NOTE — Progress Notes (Deleted)
Pre visit review using our clinic review tool, if applicable. No additional management support is needed unless otherwise documented below in the visit note. 

## 2016-11-11 ENCOUNTER — Encounter: Payer: Self-pay | Admitting: Emergency Medicine

## 2016-11-22 ENCOUNTER — Ambulatory Visit (HOSPITAL_BASED_OUTPATIENT_CLINIC_OR_DEPARTMENT_OTHER)
Admission: RE | Admit: 2016-11-22 | Discharge: 2016-11-22 | Disposition: A | Discharge status: Home / Self Care | Payer: Medicare Other | Source: Ambulatory Visit | Attending: Physician Assistant | Admitting: Physician Assistant

## 2016-11-22 ENCOUNTER — Telehealth: Payer: Self-pay | Admitting: Physician Assistant

## 2016-11-22 ENCOUNTER — Encounter: Payer: Self-pay | Admitting: Physician Assistant

## 2016-11-22 ENCOUNTER — Ambulatory Visit (INDEPENDENT_AMBULATORY_CARE_PROVIDER_SITE_OTHER): Payer: Medicare Other | Admitting: Physician Assistant

## 2016-11-22 VITALS — BP 122/70 | HR 82 | Temp 97.6°F | Resp 14 | Wt 167.0 lb

## 2016-11-22 DIAGNOSIS — I82431 Acute embolism and thrombosis of right popliteal vein: Secondary | ICD-10-CM | POA: Insufficient documentation

## 2016-11-22 DIAGNOSIS — M79661 Pain in right lower leg: Secondary | ICD-10-CM

## 2016-11-22 DIAGNOSIS — I82811 Embolism and thrombosis of superficial veins of right lower extremities: Secondary | ICD-10-CM | POA: Diagnosis not present

## 2016-11-22 DIAGNOSIS — R252 Cramp and spasm: Secondary | ICD-10-CM

## 2016-11-22 LAB — BASIC METABOLIC PANEL
BUN: 22 mg/dL (ref 6–23)
CALCIUM: 8.7 mg/dL (ref 8.4–10.5)
CHLORIDE: 104 meq/L (ref 96–112)
CO2: 28 meq/L (ref 19–32)
CREATININE: 1.37 mg/dL — AB (ref 0.40–1.20)
GFR: 38.28 mL/min — ABNORMAL LOW (ref >60.00)
GLUCOSE: 121 mg/dL — AB (ref 70–99)
Potassium: 4.8 mEq/L (ref 3.5–5.1)
Sodium: 140 mEq/L (ref 135–145)

## 2016-11-22 NOTE — Telephone Encounter (Signed)
Called patient's daughter Corrie DandyMary and notified her form ready for pick up.

## 2016-11-22 NOTE — Patient Instructions (Signed)
Please stay well-hydrated. Get plenty of rest.  Use a topical Icy hot to the lower back. Heating pads as discussed. Take Tylenol arthritis for pain.  For the leg, please speak with Marylene Landngela or Doy HutchingSaundrea up front to schedule your Ultrasound for today so we can just make sure there is no clot present. Keep elevated. We will alter regimen based on results.

## 2016-11-22 NOTE — Telephone Encounter (Signed)
While checking out, patient's daughter requested to have DMV disability parking placard form completed by pcp.  Charge sheet completed and form was handed to Sandy Oaksody while he was in the front office.

## 2016-11-22 NOTE — Progress Notes (Signed)
Patient presents to clinic today with her daughter c/o 2 days of swelling to mid calf of RLE. Noted some redness at tenderness at onset that has resolved. Denies known trauma or injury. Note swelling is much improved today. Patient with history of multiple DVT and prior PE. Has significant history of GI bleeds and acute blood loss anemia. Is not candidate for anticoagulants at present per Cardiology and GI. Has IVC filter in place. Patient also noted one episode of sharp back pain when standing the other day. No recurrence.  Past Medical History:  Diagnosis Date  . Atrial fibrillation ([place])   . Cataracts, bilateral   . Chronic kidney disease    chronic kidney disease  . Cystocele   . Diverticulosis   . Dysrhythmia    A- fib  . Encounter for fitting and adjustment of pessary 04/23/2002  . Female bladder prolapse   . Gallstones   . Hemorrhoids   . Hiatal hernia   . High cholesterol   . Hyperkalemia   . Hyperlipidemia   . Hypothyroidism   . Iron deficiency anemia   . Leukocytosis   . Lordosis   . Osteoarthritis   . Osteoarthritis   . Osteoporosis   . Pneumonia    hx aspiration pneumonia  . Rectocele   . Renal insufficiency   . Scoliosis   . Urinary tract infection without hematuria     Current Outpatient Prescriptions on File Prior to Visit  Medication Sig Dispense Refill  . acetaminophen (TYLENOL) 650 MG CR tablet Take 650 mg by mouth every 8 (eight) hours as needed for pain.    Marland Kitchen amiodarone (PACERONE) 100 MG tablet Take 1 tablet (100 mg total) by mouth daily. 45 tablet 1  . Calcium Carb-Cholecalciferol (CALCIUM-VITAMIN D) 600-400 MG-UNIT TABS Take 1 tablet by mouth at bedtime.     . Ferrous Sulfate (IRON) 325 (65 Fe) MG TABS Take 1 Dose by mouth daily. 30 each 6  . hydroxypropyl methylcellulose / hypromellose (ISOPTO TEARS / GONIOVISC) 2.5 % ophthalmic solution Place 1 drop into both eyes 4 (four) times daily as needed for dry eyes.    Marland Kitchen levothyroxine (SYNTHROID,  LEVOTHROID) 75 MCG tablet Take 75 mcg by mouth daily before breakfast.    . nitrofurantoin (MACRODANTIN) 50 MG capsule Take 50 mg by mouth daily with breakfast.     . pantoprazole (PROTONIX) 40 MG tablet Take 40 mg by mouth daily with breakfast.     . phenylephrine-shark liver oil-mineral oil-petrolatum (PREPARATION H) 0.25-3-14-71.9 % rectal ointment Place 1 application rectally 2 (two) times daily as needed for hemorrhoids.    . polyethylene glycol (MIRALAX / GLYCOLAX) packet Take by mouth daily as needed for mild constipation or moderate constipation.    Marland Kitchen witch hazel-glycerin (TUCKS) pad Apply 1 application topically as needed for hemorrhoids.     No current facility-administered medications on file prior to visit.     Allergies  Allergen Reactions  . Bactrim [Sulfamethoxazole-Trimethoprim] Other (See Comments)    Has chronic kidney disease.  This is not recommended by nephrologist.    . Contrast Media [Iodinated Diagnostic Agents] Other (See Comments)    Affected her labs for her kidneys   . Nsaids Other (See Comments)    Has chronic kidney disease.  This is not recommended by nephrologist.    Family History  Problem Relation Age of Onset  . Diabetes Father   . Leukemia Father   . Breast cancer Paternal Aunt   . Breast cancer Paternal  Aunt   . Cancer Unknown        several family members on dad's side of family  . Heart failure Brother        Pt cannot confirm this history    Social History   Social History  . Marital status: Widowed    Spouse name: N/A  . Number of children: 4  . Years of education: N/A   Occupational History  . Retired Radio producer    Social History Main Topics  . Smoking status: Never Smoker  . Smokeless tobacco: Never Used  . Alcohol use No  . Drug use: No  . Sexual activity: No   Other Topics Concern  . None   Social History Narrative   Widowed.  Lives alone.  Uses a cane/walker to ambulate but independent of ADLs.   Review of  Systems - See HPI.  All other ROS are negative.  BP 122/70   Pulse 82   Temp 97.6 F (36.4 C) (Oral)   Resp 14   Wt 167 lb (75.8 kg)   SpO2 95%   BMI 30.54 kg/m   Physical Exam  Constitutional: She is oriented to person, place, and time and well-developed, well-nourished, and in no distress.  HENT:  Head: Normocephalic and atraumatic.  Eyes: Conjunctivae are normal.  Neck: Neck supple.  Cardiovascular: Normal rate, regular rhythm, normal heart sounds and intact distal pulses.   Pulses:      Popliteal pulses are 2+ on the right side, and 2+ on the left side.       Dorsalis pedis pulses are 2+ on the right side, and 2+ on the left side.       Posterior tibial pulses are 2+ on the right side, and 2+ on the left side.  1+ pitting edema of RLE up to lower calf. Negative Homan sign. No tenderness to palpation of posterior calf. Some tenderness noted of palpation of lateral anterior calf.   Pulmonary/Chest: Effort normal and breath sounds normal. No respiratory distress. She has no wheezes. She has no rales. She exhibits no tenderness.  Neurological: She is alert and oriented to person, place, and time.  Skin: Skin is warm and dry. No rash noted.  Psychiatric: Affect normal.  Vitals reviewed.   Recent Results (from the past 2160 hour(s))  Type and screen [place]     Status: None   Collection Time: 09/01/16 10:32 AM  Result Value Ref Range   ABO/RH(D) O NEG    Antibody Screen NEG    Sample Expiration 09/04/2016    Extend sample reason NO TRANSFUSIONS OR PREGNANCY IN THE PAST 3 MONTHS    Unit Number [id number]    Blood Component Type RED CELLS,LR    Unit division 00    Status of Unit ISSUED,FINAL    Transfusion Status OK TO TRANSFUSE    Crossmatch Result Compatible    Unit Number [id number]    Blood Component Type RED CELLS,LR    Unit division 00    Status of Unit ISSUED,FINAL    Transfusion Status OK TO TRANSFUSE    Crossmatch Result Compatible     BPAM RBC     Status: None   Collection Time: 09/01/16 10:32 AM  Result Value Ref Range   ISSUE DATE / TIME [id number]    Blood Product Unit Number [id number]    PRODUCT CODE E0336V00    Unit Type and Rh 9500    Blood Product Expiration Date 291916606004  ISSUE DATE / TIME 979892119417    Blood Product Unit Number [id number]    PRODUCT CODE E0336V00    Unit Type and Rh 9500    Blood Product Expiration Date 497026378588   CBC with Differential/Platelet     Status: Abnormal   Collection Time: 09/01/16 10:35 AM  Result Value Ref Range   WBC 16.3 (H) 4.0 - 10.5 K/uL   RBC 2.63 (L) 3.87 - 5.11 MIL/uL   Hemoglobin 6.0 (LL) 12.0 - 15.0 g/dL    Comment: REPEATED TO VERIFY CRITICAL RESULT CALLED TO, READ BACK BY AND VERIFIED WITH: HAMBY,M. RN AT 1118 ON 02.28.18 BY COHEN,K    HCT 19.6 (L) 36.0 - 46.0 %   MCV 74.5 (L) 78.0 - 100.0 fL   MCH 22.8 (L) 26.0 - 34.0 pg   MCHC 30.6 30.0 - 36.0 g/dL   RDW 16.6 (H) 11.5 - 15.5 %   Platelets 376 150 - 400 K/uL   Neutrophils Relative % 77 %   Lymphocytes Relative 8 %   Monocytes Relative 15 %   Eosinophils Relative 0 %   Basophils Relative 0 %   Neutro Abs 12.6 (H) 1.7 - 7.7 K/uL   Lymphs Abs 1.3 0.7 - 4.0 K/uL   Monocytes Absolute 2.4 (H) 0.1 - 1.0 K/uL   Eosinophils Absolute 0.0 0.0 - 0.7 K/uL   Basophils Absolute 0.0 0.0 - 0.1 K/uL   Smear Review LARGE PLATELETS PRESENT   Basic metabolic panel     Status: Abnormal   Collection Time: 09/01/16 10:35 AM  Result Value Ref Range   Sodium 137 135 - 145 mmol/L   Potassium 4.3 3.5 - 5.1 mmol/L   Chloride 104 101 - 111 mmol/L   CO2 26 22 - 32 mmol/L   Glucose, Bld 115 (H) 65 - 99 mg/dL   BUN 23 (H) 6 - 20 mg/dL   Creatinine, Ser 1.53 (H) 0.44 - 1.00 mg/dL   Calcium 8.3 (L) 8.9 - 10.3 mg/dL   GFR calc non Af Amer 28 (L) >60 mL/min   GFR calc Af Amer 33 (L) >60 mL/min    Comment: (NOTE) The eGFR has been calculated using the CKD EPI equation. This calculation has not been  validated in all clinical situations. eGFR's persistently <60 mL/min signify possible Chronic Kidney Disease.    Anion gap 7 5 - 15  Vitamin B12     Status: None   Collection Time: 09/01/16 10:35 AM  Result Value Ref Range   Vitamin B-12 198 180 - 914 pg/mL    Comment: Performed at [facility] [address], [place], [place] [address]  Folate     Status: None   Collection Time: 09/01/16 10:35 AM  Result Value Ref Range   Folate 27.3 >5.9 ng/mL    Comment: Performed at [facility] [address] [place], [place] [address]  Iron and TIBC     Status: Abnormal   Collection Time: 09/01/16 10:35 AM  Result Value Ref Range   Iron <5 (L) 28 - 170 ug/dL    Comment: REPEATED TO VERIFY   TIBC 330 250 - 450 ug/dL   Saturation Ratios NOT CALCULATED 10.4 - 31.8 %    Comment: IRON <5   UIBC NOT CALCULATED ug/dL    Comment: IRON <5 Performed at [facility] [address] [place], [place] [address]   Ferritin     Status: Abnormal   Collection Time: 09/01/16 10:35 AM  Result Value Ref Range   Ferritin 5 (L) 11 - 307 ng/mL    Comment: Performed at [facility], [place] [address] [place], [place] [address]  Reticulocytes     Status: Abnormal   Collection Time: 09/01/16 10:35 AM  Result Value Ref Range   Retic Ct Pct 3.2 (H) 0.4 - 3.1 %   RBC. 2.63 (L) 3.87 - 5.11 MIL/uL   Retic Count, Manual 84.2 19.0 - 186.0 K/uL  APTT     Status: None   Collection Time: 09/01/16 10:35 AM  Result Value Ref Range   aPTT 30 24 - 36 seconds  Protime-INR     Status: None   Collection Time: 09/01/16 10:35 AM  Result Value Ref Range   Prothrombin Time 14.7 11.4 - 15.2 seconds   INR 1.15   T4, free     Status: Abnormal   Collection Time: 09/01/16 10:35 AM  Result Value Ref Range   Free T4 1.47 (H) 0.61 - 1.12 ng/dL    Comment: (NOTE) Biotin ingestion may interfere with free T4 tests. If the results are inconsistent with the TSH level, previous test results, or the clinical  presentation, then consider biotin interference. If needed, order repeat testing after stopping biotin. Performed at [facility], [place] [address], [place], [place] [address]   POC occult blood, ED RN will collect     Status: Abnormal   Collection Time: 09/01/16 11:23 AM  Result Value Ref Range   Fecal Occult Bld POSITIVE (A) NEGATIVE  Prepare RBC     Status: None   Collection Time: 09/01/16 11:47 AM  Result Value Ref Range   Order Confirmation ORDER PROCESSED BY BLOOD BANK   Magnesium     Status: None   Collection Time: 09/01/16 12:48 PM  Result Value Ref Range   Magnesium 1.9 1.7 - 2.4 mg/dL  TSH     Status: None   Collection Time: 09/01/16 12:48 PM  Result Value Ref Range   TSH 2.005 0.350 - 4.500 uIU/mL    Comment: Performed by a 3rd Generation assay with a functional sensitivity of <=0.01 uIU/mL.  Troponin I     Status: Abnormal   Collection Time: 09/01/16 12:48 PM  Result Value Ref Range   Troponin I 0.04 (HH) <0.03 ng/mL    Comment: CRITICAL RESULT CALLED TO, READ BACK BY AND VERIFIED WITH: HAMBY,M @ 1337 ON [id number] BY POTEAT,S   Troponin I     Status: Abnormal   Collection Time: 09/01/16  7:08 PM  Result Value Ref Range   Troponin I 0.04 (HH) <0.03 ng/mL    Comment: CRITICAL VALUE NOTED.  VALUE IS CONSISTENT WITH PREVIOUSLY REPORTED AND CALLED VALUE.  CBC     Status: Abnormal   Collection Time: 09/01/16 10:36 PM  Result Value Ref Range   WBC 14.1 (H) 4.0 - 10.5 K/uL   RBC 3.76 (L) 3.87 - 5.11 MIL/uL   Hemoglobin 8.8 (L) 12.0 - 15.0 g/dL    Comment: DELTA CHECK NOTED POST TRANSFUSION SPECIMEN REPEATED TO VERIFY    HCT 28.6 (L) 36.0 - 46.0 %   MCV 76.1 (L) 78.0 - 100.0 fL   MCH 23.4 (L) 26.0 - 34.0 pg   MCHC 30.8 30.0 - 36.0 g/dL   RDW 18.3 (H) 11.5 - 15.5 %   Platelets 341 150 - 400 K/uL  MRSA PCR Screening     Status: None   Collection Time: 09/01/16 11:03 PM  Result Value Ref Range  MRSA by PCR NEGATIVE NEGATIVE    Comment:        The GeneXpert  MRSA Assay (FDA approved for NASAL specimens only), is one component of a comprehensive MRSA colonization surveillance program. It is not intended to diagnose MRSA infection nor to guide or monitor treatment for MRSA infections.   Troponin I     Status: Abnormal   Collection Time: 09/02/16 12:35 AM  Result Value Ref Range   Troponin I 0.04 (HH) <0.03 ng/mL    Comment: CRITICAL VALUE NOTED.  VALUE IS CONSISTENT WITH PREVIOUSLY REPORTED AND CALLED VALUE.  Comprehensive metabolic panel     Status: Abnormal   Collection Time: 09/02/16  3:43 AM  Result Value Ref Range   Sodium 141 135 - 145 mmol/L   Potassium 4.3 3.5 - 5.1 mmol/L   Chloride 108 101 - 111 mmol/L   CO2 27 22 - 32 mmol/L   Glucose, Bld 95 65 - 99 mg/dL   BUN 16 6 - 20 mg/dL   Creatinine, Ser 1.32 (H) 0.44 - 1.00 mg/dL   Calcium 8.2 (L) 8.9 - 10.3 mg/dL   Total Protein 6.0 (L) 6.5 - 8.1 g/dL   Albumin 3.2 (L) 3.5 - 5.0 g/dL   AST 13 (L) 15 - 41 U/L   ALT 8 (L) 14 - 54 U/L   Alkaline Phosphatase 52 38 - 126 U/L   Total Bilirubin 0.8 0.3 - 1.2 mg/dL   GFR calc non Af Amer 34 (L) >60 mL/min   GFR calc Af Amer 39 (L) >60 mL/min    Comment: (NOTE) The eGFR has been calculated using the CKD EPI equation. This calculation has not been validated in all clinical situations. eGFR's persistently <60 mL/min signify possible Chronic Kidney Disease.    Anion gap 6 5 - 15  CBC     Status: Abnormal   Collection Time: 09/02/16  3:43 AM  Result Value Ref Range   WBC 11.8 (H) 4.0 - 10.5 K/uL   RBC 3.32 (L) 3.87 - 5.11 MIL/uL   Hemoglobin 8.0 (L) 12.0 - 15.0 g/dL   HCT 25.6 (L) 36.0 - 46.0 %   MCV 77.1 (L) 78.0 - 100.0 fL   MCH 24.1 (L) 26.0 - 34.0 pg   MCHC 31.3 30.0 - 36.0 g/dL   RDW 18.6 (H) 11.5 - 15.5 %   Platelets 339 150 - 400 K/uL  Echocardiogram     Status: None   Collection Time: 09/02/16 12:44 PM  Result Value Ref Range   Weight 2,818.36 oz   Height 62 in   BP 154/53 mmHg  I-STAT, chem 8     Status: Abnormal     Collection Time: 10/01/16  7:13 PM  Result Value Ref Range   Sodium 139 135 - 145 mmol/L   Potassium 5.1 3.5 - 5.1 mmol/L   Chloride 102 101 - 111 mmol/L   BUN 23 (H) 6 - 20 mg/dL   Creatinine, Ser 1.30 (H) 0.44 - 1.00 mg/dL   Glucose, Bld 116 (H) 65 - 99 mg/dL   Calcium, Ion 1.12 (L) 1.15 - 1.40 mmol/L   TCO2 31 0 - 100 mmol/L   Hemoglobin 13.3 12.0 - 15.0 g/dL   HCT 39.0 36.0 - 46.0 %  B12     Status: None   Collection Time: 10/19/16  2:55 PM  Result Value Ref Range   Vitamin B-12 274 211 - 911 pg/mL  CBC     Status: Abnormal   Collection Time:  10/19/16  2:55 PM  Result Value Ref Range   WBC 12.9 (H) 4.0 - 10.5 K/uL   RBC 4.93 3.87 - 5.11 Mil/uL   Platelets 243.0 150.0 - 400.0 K/uL   Hemoglobin 12.5 Repeated and verified X2. 12.0 - 15.0 g/dL   HCT 40.8 Repeated and verified X2. 36.0 - 46.0 %   MCV 82.8 78.0 - 100.0 fl   MCHC 30.7 30.0 - 36.0 g/dL   RDW 24.8 (H) 11.5 - 15.5 %  TSH     Status: None   Collection Time: 10/20/16  1:08 PM  Result Value Ref Range   TSH 2.30 0.35 - 4.50 uIU/mL    Assessment/Plan: 1. Right calf pain 2. Leg Cramps STAT US to r/o DVT giving history. Patient with IVC filter in place. Has relative contraindications to blood thinners. If positive, will need to see Hematologist here in the area. Discussed alarm signs/symptoms with patient and daughters who voice understanding.  - US Venous Img Lower Unilateral Right; Future - Basic metabolic panel   Leeanne Rio, PA-C

## 2016-11-22 NOTE — Telephone Encounter (Signed)
DMV forms completed. Placed up front for pick up. Please call patient to let them know it is ready. No charge.

## 2016-11-23 ENCOUNTER — Other Ambulatory Visit: Payer: Self-pay | Admitting: Physician Assistant

## 2016-11-23 DIAGNOSIS — K922 Gastrointestinal hemorrhage, unspecified: Secondary | ICD-10-CM

## 2016-11-23 DIAGNOSIS — Z86711 Personal history of pulmonary embolism: Secondary | ICD-10-CM

## 2016-11-23 DIAGNOSIS — I82411 Acute embolism and thrombosis of right femoral vein: Secondary | ICD-10-CM

## 2016-11-24 ENCOUNTER — Telehealth: Payer: Self-pay | Admitting: Physician Assistant

## 2016-11-24 NOTE — Telephone Encounter (Signed)
Patient's daughter calling to check status of referral to hematology.  Per Marylene LandAngela someone from hematology office was to call patient today to schedule appt.  However per daughter no one has called her yet.  I attempted to call office to get appt schedule but their office phones turn off at 4:30 and call was routed to team health.  We will attempt to call office tomorrow in order to get patient scheduled.  Daughter also wants to know if lovenox injections would be more beneficial for patient than xarelto.

## 2016-11-24 NOTE — Telephone Encounter (Signed)
She would have to discuss with hematology but giving age, history and the fact she has the IVC filter in place, they are likely not going to try to break the clots. Spoke with hematology today when they were getting more information from me and per the coordinator, the specialist did not feel anything emergent or acute was needed.

## 2016-11-25 ENCOUNTER — Telehealth: Payer: Self-pay | Admitting: Hematology

## 2016-11-25 ENCOUNTER — Encounter: Payer: Self-pay | Admitting: Hematology

## 2016-11-25 NOTE — Telephone Encounter (Signed)
Appt has been scheduled for the pt to see Dr. Mosetta PuttFeng on 6/8 at 11am. Referring office will notify the pt. Letter mailed.

## 2016-11-26 NOTE — Telephone Encounter (Signed)
Patient has appt scheduled 12/10/16 with hematology.

## 2016-11-28 ENCOUNTER — Encounter (HOSPITAL_COMMUNITY): Payer: Self-pay | Admitting: Emergency Medicine

## 2016-11-28 ENCOUNTER — Emergency Department (HOSPITAL_COMMUNITY): Payer: Medicare Other

## 2016-11-28 ENCOUNTER — Inpatient Hospital Stay (HOSPITAL_COMMUNITY)
Admission: EM | Admit: 2016-11-28 | Discharge: 2017-01-02 | DRG: 871 | Disposition: E | Payer: Medicare Other | Attending: Internal Medicine | Admitting: Internal Medicine

## 2016-11-28 DIAGNOSIS — R402132 Coma scale, eyes open, to sound, at arrival to emergency department: Secondary | ICD-10-CM | POA: Diagnosis present

## 2016-11-28 DIAGNOSIS — R402212 Coma scale, best verbal response, none, at arrival to emergency department: Secondary | ICD-10-CM | POA: Diagnosis present

## 2016-11-28 DIAGNOSIS — N360 Urethral fistula: Secondary | ICD-10-CM | POA: Diagnosis present

## 2016-11-28 DIAGNOSIS — M810 Age-related osteoporosis without current pathological fracture: Secondary | ICD-10-CM | POA: Diagnosis present

## 2016-11-28 DIAGNOSIS — E861 Hypovolemia: Secondary | ICD-10-CM | POA: Diagnosis present

## 2016-11-28 DIAGNOSIS — N820 Vesicovaginal fistula: Secondary | ICD-10-CM | POA: Diagnosis present

## 2016-11-28 DIAGNOSIS — R55 Syncope and collapse: Secondary | ICD-10-CM | POA: Diagnosis present

## 2016-11-28 DIAGNOSIS — E872 Acidosis: Secondary | ICD-10-CM | POA: Diagnosis present

## 2016-11-28 DIAGNOSIS — K449 Diaphragmatic hernia without obstruction or gangrene: Secondary | ICD-10-CM | POA: Diagnosis not present

## 2016-11-28 DIAGNOSIS — R1314 Dysphagia, pharyngoesophageal phase: Secondary | ICD-10-CM | POA: Diagnosis present

## 2016-11-28 DIAGNOSIS — E785 Hyperlipidemia, unspecified: Secondary | ICD-10-CM | POA: Diagnosis present

## 2016-11-28 DIAGNOSIS — Z833 Family history of diabetes mellitus: Secondary | ICD-10-CM | POA: Diagnosis not present

## 2016-11-28 DIAGNOSIS — N170 Acute kidney failure with tubular necrosis: Secondary | ICD-10-CM | POA: Diagnosis not present

## 2016-11-28 DIAGNOSIS — J9601 Acute respiratory failure with hypoxia: Secondary | ICD-10-CM | POA: Diagnosis present

## 2016-11-28 DIAGNOSIS — Z91041 Radiographic dye allergy status: Secondary | ICD-10-CM

## 2016-11-28 DIAGNOSIS — Z7189 Other specified counseling: Secondary | ICD-10-CM

## 2016-11-28 DIAGNOSIS — Z886 Allergy status to analgesic agent status: Secondary | ICD-10-CM

## 2016-11-28 DIAGNOSIS — Z86718 Personal history of other venous thrombosis and embolism: Secondary | ICD-10-CM

## 2016-11-28 DIAGNOSIS — K440 Diaphragmatic hernia with obstruction, without gangrene: Secondary | ICD-10-CM | POA: Diagnosis present

## 2016-11-28 DIAGNOSIS — Z806 Family history of leukemia: Secondary | ICD-10-CM

## 2016-11-28 DIAGNOSIS — K921 Melena: Secondary | ICD-10-CM | POA: Diagnosis present

## 2016-11-28 DIAGNOSIS — E039 Hypothyroidism, unspecified: Secondary | ICD-10-CM | POA: Diagnosis present

## 2016-11-28 DIAGNOSIS — E7800 Pure hypercholesterolemia, unspecified: Secondary | ICD-10-CM | POA: Diagnosis present

## 2016-11-28 DIAGNOSIS — R6520 Severe sepsis without septic shock: Secondary | ICD-10-CM | POA: Diagnosis present

## 2016-11-28 DIAGNOSIS — R4182 Altered mental status, unspecified: Secondary | ICD-10-CM

## 2016-11-28 DIAGNOSIS — E860 Dehydration: Secondary | ICD-10-CM | POA: Diagnosis present

## 2016-11-28 DIAGNOSIS — Z9849 Cataract extraction status, unspecified eye: Secondary | ICD-10-CM | POA: Diagnosis not present

## 2016-11-28 DIAGNOSIS — A419 Sepsis, unspecified organism: Secondary | ICD-10-CM | POA: Diagnosis present

## 2016-11-28 DIAGNOSIS — J9600 Acute respiratory failure, unspecified whether with hypoxia or hypercapnia: Secondary | ICD-10-CM | POA: Diagnosis not present

## 2016-11-28 DIAGNOSIS — Z978 Presence of other specified devices: Secondary | ICD-10-CM

## 2016-11-28 DIAGNOSIS — M1990 Unspecified osteoarthritis, unspecified site: Secondary | ICD-10-CM | POA: Diagnosis present

## 2016-11-28 DIAGNOSIS — I951 Orthostatic hypotension: Secondary | ICD-10-CM

## 2016-11-28 DIAGNOSIS — Z66 Do not resuscitate: Secondary | ICD-10-CM | POA: Diagnosis not present

## 2016-11-28 DIAGNOSIS — Z9049 Acquired absence of other specified parts of digestive tract: Secondary | ICD-10-CM

## 2016-11-28 DIAGNOSIS — R0602 Shortness of breath: Secondary | ICD-10-CM

## 2016-11-28 DIAGNOSIS — R1310 Dysphagia, unspecified: Secondary | ICD-10-CM | POA: Diagnosis not present

## 2016-11-28 DIAGNOSIS — M419 Scoliosis, unspecified: Secondary | ICD-10-CM | POA: Diagnosis present

## 2016-11-28 DIAGNOSIS — Z881 Allergy status to other antibiotic agents status: Secondary | ICD-10-CM

## 2016-11-28 DIAGNOSIS — I350 Nonrheumatic aortic (valve) stenosis: Secondary | ICD-10-CM | POA: Diagnosis not present

## 2016-11-28 DIAGNOSIS — I959 Hypotension, unspecified: Secondary | ICD-10-CM | POA: Diagnosis present

## 2016-11-28 DIAGNOSIS — Z803 Family history of malignant neoplasm of breast: Secondary | ICD-10-CM

## 2016-11-28 DIAGNOSIS — Z515 Encounter for palliative care: Secondary | ICD-10-CM | POA: Diagnosis not present

## 2016-11-28 DIAGNOSIS — R112 Nausea with vomiting, unspecified: Secondary | ICD-10-CM

## 2016-11-28 DIAGNOSIS — R402362 Coma scale, best motor response, obeys commands, at arrival to emergency department: Secondary | ICD-10-CM | POA: Diagnosis present

## 2016-11-28 DIAGNOSIS — I482 Chronic atrial fibrillation: Secondary | ICD-10-CM | POA: Diagnosis present

## 2016-11-28 DIAGNOSIS — Z8249 Family history of ischemic heart disease and other diseases of the circulatory system: Secondary | ICD-10-CM

## 2016-11-28 DIAGNOSIS — D509 Iron deficiency anemia, unspecified: Secondary | ICD-10-CM | POA: Diagnosis present

## 2016-11-28 LAB — CBC
HEMATOCRIT: 45.5 % (ref 36.0–46.0)
Hemoglobin: 13.2 g/dL (ref 12.0–15.0)
MCH: 27.4 pg (ref 26.0–34.0)
MCHC: 29 g/dL — AB (ref 30.0–36.0)
MCV: 94.4 fL (ref 78.0–100.0)
Platelets: 175 10*3/uL (ref 150–400)
RBC: 4.82 MIL/uL (ref 3.87–5.11)
RDW: 21.7 % — AB (ref 11.5–15.5)
WBC: 28.7 10*3/uL — ABNORMAL HIGH (ref 4.0–10.5)

## 2016-11-28 LAB — COMPREHENSIVE METABOLIC PANEL
ALK PHOS: 63 U/L (ref 38–126)
ALT: 13 U/L — AB (ref 14–54)
ALT: 26 U/L (ref 14–54)
ANION GAP: 14 (ref 5–15)
ANION GAP: 13 (ref 5–15)
AST: 46 U/L — ABNORMAL HIGH (ref 15–41)
AST: 68 U/L — ABNORMAL HIGH (ref 15–41)
Albumin: 3.1 g/dL — ABNORMAL LOW (ref 3.5–5.0)
Albumin: 3.1 g/dL — ABNORMAL LOW (ref 3.5–5.0)
Alkaline Phosphatase: 65 U/L (ref 38–126)
BILIRUBIN TOTAL: 0.4 mg/dL (ref 0.3–1.2)
BILIRUBIN TOTAL: 0.7 mg/dL (ref 0.3–1.2)
BUN: 23 mg/dL — ABNORMAL HIGH (ref 6–20)
BUN: 24 mg/dL — ABNORMAL HIGH (ref 6–20)
CALCIUM: 8.3 mg/dL — AB (ref 8.9–10.3)
CO2: 17 mmol/L — AB (ref 22–32)
CO2: 12 mmol/L — ABNORMAL LOW (ref 22–32)
CREATININE: 1.89 mg/dL — AB (ref 0.44–1.00)
Calcium: 7.7 mg/dL — ABNORMAL LOW (ref 8.9–10.3)
Chloride: 107 mmol/L (ref 101–111)
Chloride: 111 mmol/L (ref 101–111)
Creatinine, Ser: 2.06 mg/dL — ABNORMAL HIGH (ref 0.44–1.00)
GFR calc Af Amer: 23 mL/min — ABNORMAL LOW (ref >60)
GFR, EST AFRICAN AMERICAN: 25 mL/min — AB (ref >60)
GFR, EST NON AFRICAN AMERICAN: 22 mL/min — AB (ref >60)
GFR, EST NON AFRICAN AMERICAN: 20 mL/min — AB (ref >60)
Glucose, Bld: 207 mg/dL — ABNORMAL HIGH (ref 65–99)
Glucose, Bld: 219 mg/dL — ABNORMAL HIGH (ref 65–99)
POTASSIUM: 5.3 mmol/L — AB (ref 3.5–5.1)
Potassium: 5.4 mmol/L — ABNORMAL HIGH (ref 3.5–5.1)
SODIUM: 138 mmol/L (ref 135–145)
Sodium: 136 mmol/L (ref 135–145)
TOTAL PROTEIN: 6.5 g/dL (ref 6.5–8.1)
TOTAL PROTEIN: 6.5 g/dL (ref 6.5–8.1)

## 2016-11-28 LAB — I-STAT TROPONIN, ED: TROPONIN I, POC: 0.02 ng/mL (ref 0.00–0.08)

## 2016-11-28 LAB — I-STAT CHEM 8, ED
BUN: 27 mg/dL — AB (ref 6–20)
Calcium, Ion: 1.01 mmol/L — ABNORMAL LOW (ref 1.15–1.40)
Chloride: 108 mmol/L (ref 101–111)
Creatinine, Ser: 1.8 mg/dL — ABNORMAL HIGH (ref 0.44–1.00)
GLUCOSE: 197 mg/dL — AB (ref 65–99)
HCT: 44 % (ref 36.0–46.0)
Hemoglobin: 15 g/dL (ref 12.0–15.0)
POTASSIUM: 5.2 mmol/L — AB (ref 3.5–5.1)
Sodium: 139 mmol/L (ref 135–145)
TCO2: 18 mmol/L (ref 0–100)

## 2016-11-28 LAB — I-STAT CG4 LACTIC ACID, ED: Lactic Acid, Venous: 9.9 mmol/L (ref 0.5–1.9)

## 2016-11-28 LAB — I-STAT ARTERIAL BLOOD GAS, ED
ACID-BASE DEFICIT: 16 mmol/L — AB (ref 0.0–2.0)
Bicarbonate: 15.4 mmol/L — ABNORMAL LOW (ref 20.0–28.0)
O2 SAT: 46 %
TCO2: 17 mmol/L (ref 0–100)
pCO2 arterial: 60.9 mmHg — ABNORMAL HIGH (ref 32.0–48.0)
pH, Arterial: 7.011 — CL (ref 7.350–7.450)
pO2, Arterial: 38 mmHg — CL (ref 83.0–108.0)

## 2016-11-28 LAB — PROCALCITONIN: Procalcitonin: 2 ng/mL

## 2016-11-28 LAB — CBC WITH DIFFERENTIAL/PLATELET
BASOS PCT: 0 %
Basophils Absolute: 0 10*3/uL (ref 0.0–0.1)
EOS PCT: 1 %
Eosinophils Absolute: 0.2 10*3/uL (ref 0.0–0.7)
HCT: 42.6 % (ref 36.0–46.0)
HEMOGLOBIN: 12.4 g/dL (ref 12.0–15.0)
Lymphocytes Relative: 23 %
Lymphs Abs: 4.5 10*3/uL — ABNORMAL HIGH (ref 0.7–4.0)
MCH: 26.8 pg (ref 26.0–34.0)
MCHC: 29.1 g/dL — AB (ref 30.0–36.0)
MCV: 92.2 fL (ref 78.0–100.0)
MONO ABS: 1.8 10*3/uL — AB (ref 0.1–1.0)
Monocytes Relative: 9 %
NEUTROS ABS: 13.1 10*3/uL — AB (ref 1.7–7.7)
NEUTROS PCT: 67 %
Platelets: 169 10*3/uL (ref 150–400)
RBC: 4.62 MIL/uL (ref 3.87–5.11)
RDW: 21.2 % — ABNORMAL HIGH (ref 11.5–15.5)
WBC: 19.6 10*3/uL — ABNORMAL HIGH (ref 4.0–10.5)

## 2016-11-28 LAB — PROTIME-INR
INR: 1.2
INR: 1.31
PROTHROMBIN TIME: 16.3 s — AB (ref 11.4–15.2)
Prothrombin Time: 15.3 seconds — ABNORMAL HIGH (ref 11.4–15.2)

## 2016-11-28 LAB — APTT: aPTT: 33 seconds (ref 24–36)

## 2016-11-28 LAB — GLUCOSE, CAPILLARY: GLUCOSE-CAPILLARY: 226 mg/dL — AB (ref 65–99)

## 2016-11-28 LAB — TYPE AND SCREEN
ABO/RH(D): O NEG
Antibody Screen: NEGATIVE

## 2016-11-28 LAB — LACTIC ACID, PLASMA
LACTIC ACID, VENOUS: 4.9 mmol/L — AB (ref 0.5–1.9)
Lactic Acid, Venous: 3.3 mmol/L (ref 0.5–1.9)

## 2016-11-28 LAB — MRSA PCR SCREENING: MRSA BY PCR: NEGATIVE

## 2016-11-28 LAB — FIBRINOGEN: Fibrinogen: 225 mg/dL (ref 210–475)

## 2016-11-28 LAB — TROPONIN I: TROPONIN I: 0.03 ng/mL — AB (ref <0.03)

## 2016-11-28 LAB — CORTISOL: CORTISOL PLASMA: 39.1 ug/dL

## 2016-11-28 MED ORDER — ACETAMINOPHEN 325 MG PO TABS
650.0000 mg | ORAL_TABLET | ORAL | Status: DC | PRN
  Administered 2016-11-29 – 2016-11-30 (×2): 650 mg via ORAL
  Filled 2016-11-28 (×3): qty 2

## 2016-11-28 MED ORDER — ORAL CARE MOUTH RINSE
15.0000 mL | Freq: Four times a day (QID) | OROMUCOSAL | Status: DC
  Administered 2016-11-29 (×2): 15 mL via OROMUCOSAL

## 2016-11-28 MED ORDER — AMIODARONE HCL 100 MG PO TABS
100.0000 mg | ORAL_TABLET | Freq: Every day | ORAL | Status: DC
  Administered 2016-11-28 – 2016-11-30 (×3): 100 mg via ORAL
  Filled 2016-11-28 (×3): qty 1

## 2016-11-28 MED ORDER — SODIUM CHLORIDE 0.9 % IV SOLN
1.0000 g | Freq: Once | INTRAVENOUS | Status: AC
  Administered 2016-11-28: 1 g via INTRAVENOUS
  Filled 2016-11-28: qty 10

## 2016-11-28 MED ORDER — PIPERACILLIN-TAZOBACTAM 3.375 G IVPB 30 MIN: 3.3750 g | Freq: Once | INTRAVENOUS | Status: DC

## 2016-11-28 MED ORDER — PIPERACILLIN-TAZOBACTAM IN DEX 2-0.25 GM/50ML IV SOLN
2.2500 g | Freq: Four times a day (QID) | INTRAVENOUS | Status: DC
Start: 2016-11-29 — End: 2016-11-28
  Filled 2016-11-28 (×2): qty 50

## 2016-11-28 MED ORDER — SODIUM CHLORIDE 0.9 % IV BOLUS (SEPSIS): 250.0000 mL | Freq: Once | INTRAVENOUS | Status: DC

## 2016-11-28 MED ORDER — ONDANSETRON HCL 4 MG/2ML IJ SOLN
4.0000 mg | Freq: Four times a day (QID) | INTRAMUSCULAR | Status: DC | PRN
  Administered 2016-11-30 (×2): 4 mg via INTRAVENOUS
  Filled 2016-11-28 (×3): qty 2

## 2016-11-28 MED ORDER — FENTANYL CITRATE (PF) 100 MCG/2ML IJ SOLN
50.0000 ug | INTRAMUSCULAR | Status: DC | PRN
  Administered 2016-11-29: 50 ug via INTRAVENOUS
  Filled 2016-11-28: qty 2

## 2016-11-28 MED ORDER — SODIUM CHLORIDE 0.9 % IV BOLUS (SEPSIS)
1000.0000 mL | Freq: Once | INTRAVENOUS | Status: AC
  Administered 2016-11-28: 1000 mL via INTRAVENOUS

## 2016-11-28 MED ORDER — SODIUM CHLORIDE 0.9 % IV BOLUS (SEPSIS)
2300.0000 mL | Freq: Once | INTRAVENOUS | Status: AC
  Administered 2016-11-28: 2300 mL via INTRAVENOUS

## 2016-11-28 MED ORDER — SODIUM CHLORIDE 0.9 % IV BOLUS (SEPSIS): 1000.0000 mL | Freq: Once | INTRAVENOUS | Status: DC

## 2016-11-28 MED ORDER — FENTANYL CITRATE (PF) 100 MCG/2ML IJ SOLN
50.0000 ug | Freq: Once | INTRAMUSCULAR | Status: AC
  Administered 2016-11-28: 50 ug via INTRAVENOUS

## 2016-11-28 MED ORDER — FENTANYL CITRATE (PF) 100 MCG/2ML IJ SOLN
INTRAMUSCULAR | Status: AC
  Filled 2016-11-28: qty 2

## 2016-11-28 MED ORDER — BISACODYL 10 MG RE SUPP: 10.0000 mg | Freq: Every day | RECTAL | Status: DC | PRN

## 2016-11-28 MED ORDER — VANCOMYCIN HCL 10 G IV SOLR
1500.0000 mg | Freq: Once | INTRAVENOUS | Status: AC
  Administered 2016-11-28: 1500 mg via INTRAVENOUS
  Filled 2016-11-28: qty 1500

## 2016-11-28 MED ORDER — PIPERACILLIN-TAZOBACTAM 3.375 G IVPB 30 MIN
3.3750 g | Freq: Once | INTRAVENOUS | Status: AC
  Administered 2016-11-28: 3.375 g via INTRAVENOUS
  Filled 2016-11-28: qty 50

## 2016-11-28 MED ORDER — FENTANYL CITRATE (PF) 100 MCG/2ML IJ SOLN
INTRAMUSCULAR | Status: AC
  Administered 2016-11-28: 50 ug via INTRAVENOUS
  Filled 2016-11-28: qty 2

## 2016-11-28 MED ORDER — SODIUM CHLORIDE 0.9 % IV BOLUS (SEPSIS)
500.0000 mL | Freq: Once | INTRAVENOUS | Status: AC
  Administered 2016-11-28: 500 mL via INTRAVENOUS

## 2016-11-28 MED ORDER — ALBUTEROL SULFATE (2.5 MG/3ML) 0.083% IN NEBU: 2.5000 mg | INHALATION_SOLUTION | RESPIRATORY_TRACT | Status: DC | PRN

## 2016-11-28 MED ORDER — CHLORHEXIDINE GLUCONATE 0.12% ORAL RINSE (MEDLINE KIT)
15.0000 mL | Freq: Two times a day (BID) | OROMUCOSAL | Status: DC
  Administered 2016-11-28 – 2016-11-29 (×2): 15 mL via OROMUCOSAL

## 2016-11-28 MED ORDER — LEVOTHYROXINE SODIUM 75 MCG PO TABS
75.0000 ug | ORAL_TABLET | Freq: Every day | ORAL | Status: DC
  Administered 2016-11-29 – 2016-12-01 (×3): 75 ug via ORAL
  Filled 2016-11-28 (×3): qty 1

## 2016-11-28 MED ORDER — KCL IN DEXTROSE-NACL 20-5-0.9 MEQ/L-%-% IV SOLN
INTRAVENOUS | Status: DC
  Administered 2016-11-28: 20:00:00 via INTRAVENOUS
  Filled 2016-11-28 (×2): qty 1000

## 2016-11-28 MED ORDER — PIPERACILLIN-TAZOBACTAM IN DEX 2-0.25 GM/50ML IV SOLN
2.2500 g | Freq: Four times a day (QID) | INTRAVENOUS | Status: DC
  Administered 2016-11-28 – 2016-12-02 (×14): 2.25 g via INTRAVENOUS
  Filled 2016-11-28 (×18): qty 50

## 2016-11-28 MED ORDER — SODIUM CHLORIDE 0.9 % IV SOLN: 250.0000 mL | INTRAVENOUS | Status: DC | PRN

## 2016-11-28 MED ORDER — HEPARIN SODIUM (PORCINE) 5000 UNIT/ML IJ SOLN
5000.0000 [IU] | Freq: Three times a day (TID) | INTRAMUSCULAR | Status: DC
  Administered 2016-11-28 – 2016-11-29 (×2): 5000 [IU] via SUBCUTANEOUS
  Filled 2016-11-28 (×2): qty 1

## 2016-11-28 MED ORDER — FENTANYL CITRATE (PF) 100 MCG/2ML IJ SOLN
50.0000 ug | INTRAMUSCULAR | Status: DC | PRN
  Administered 2016-11-28 – 2016-11-29 (×4): 50 ug via INTRAVENOUS
  Filled 2016-11-28 (×4): qty 2

## 2016-11-28 MED ORDER — INSULIN ASPART 100 UNIT/ML ~~LOC~~ SOLN
2.0000 [IU] | SUBCUTANEOUS | Status: DC
  Administered 2016-11-28: 6 [IU] via SUBCUTANEOUS
  Administered 2016-11-29 (×3): 2 [IU] via SUBCUTANEOUS
  Administered 2016-11-29: 4 [IU] via SUBCUTANEOUS
  Administered 2016-11-29 – 2016-11-30 (×2): 2 [IU] via SUBCUTANEOUS

## 2016-11-28 MED ORDER — PANTOPRAZOLE SODIUM 40 MG IV SOLR
40.0000 mg | Freq: Every day | INTRAVENOUS | Status: DC
  Administered 2016-11-28 – 2016-12-02 (×5): 40 mg via INTRAVENOUS
  Filled 2016-11-28 (×5): qty 40

## 2016-11-28 NOTE — ED Notes (Signed)
Unable to place temp foley due to rectal fissures and vaginal bleeding. MD aware.

## 2016-11-28 NOTE — ED Notes (Signed)
bair hugger applied. Family at bedside.

## 2016-11-28 NOTE — ED Notes (Addendum)
I Stat Lactic Acid results shown to Dr. Carolan Clines'Rourke

## 2016-11-28 NOTE — ED Provider Notes (Signed)
[facility]EM[facility]ENCY DEPT Provider Note   CSN: [id number] Arrival date & time: 12-Dec-2016  1510     History   Chief Complaint Chief Complaint  Patient presents with  . Altered Mental Status    HPI Carla [name] is a 83 y.o. female.  Level V caveat for the patient's current mental status. Per report by EMS, the patient had been complaining of chills and feeling clammy and overall bad over the last couple days. Had some decreased oral intake. Was ambulating inside her house earlier today with her daughter, fell to the ground, unresponsive. EMS was called. Intubated for mental status. Given Versed 2 mg. Found to be hypotensive. Started on IV fluids. Brought here. On arrival here, the patient is unresponsive with a GCS of 3 T.   The history is provided by the EMS personnel and a relative.  Illness  This is a new problem. The current episode started less than 1 hour ago. The problem occurs constantly. The problem has not changed since onset.She has tried nothing for the symptoms.    Past Medical History:  Diagnosis Date  . Atrial fibrillation (HCC)   . Cataracts, bilateral   . Chronic kidney disease    chronic kidney disease  . Cystocele   . Diverticulosis   . Dysrhythmia    A- fib  . Encounter for fitting and adjustment of pessary 04/23/2002  . Female bladder prolapse   . Gallstones   . Hemorrhoids   . Hiatal hernia   . High cholesterol   . Hyperkalemia   . Hyperlipidemia   . Hypothyroidism   . Iron deficiency anemia   . Leukocytosis   . Lordosis   . Osteoarthritis   . Osteoarthritis   . Osteoporosis   . Pneumonia    hx aspiration pneumonia  . Rectocele   . Renal insufficiency   . Scoliosis   . Urinary tract infection without hematuria     Patient Active Problem List   Diagnosis Date Noted  . GI bleed 09/01/2016  . Acute blood loss anemia   . Fracture, intertrochanteric, right femur (HCC) 12/13/2015  . Pulmonary embolism (HCC) 08/15/2015  . Elevated brain  natriuretic peptide (BNP) level 08/15/2015  . Hypothyroid 02/27/2015  . 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 Acid reflux 02/21/2014  . Hyperglycemia 01/08/2013  . Arthritis 12/19/2012  . Presence of pessary 09/26/2012  . Incomplete bladder emptying 09/04/2012  . Atrial fibrillation (HCC) 08/19/2012  . Leukocytosis 08/19/2012  . Iron deficiency anemia   . Osteoarthritis   . Female bladder prolapse   . Osteoporosis   . Hemorrhoids   . Diverticulosis   . Hiatal hernia   . Scoliosis 01/19/2012  . HLD (hyperlipidemia) 09/23/2011    Past Surgical History:  Procedure Laterality Date  . CATARACT EXTRACTION    . CHOLECYSTECTOMY    . COLONOSCOPY    . ENDOMETRIAL BIOPSY  09/2009  . ESOPHAGOGASTRODUODENOSCOPY    . ESOPHAGOGASTRODUODENOSCOPY N/A 01/09/2013   Procedure: ESOPHAGOGASTRODUODENOSCOPY (EGD);  Surgeon: Vertell Novak., MD;  Location: [facility];  Service: Endoscopy;  Laterality: N/A;  . INTRAMEDULLARY (IM) NAIL INTERTROCHANTERIC Right 12/13/2015   Procedure: INTRAMEDULLARY (IM) NAIL INTERTROCHANTRIC;  Surgeon: Samson Frederic, MD;  Location: [facility];  Service: Orthopedics;  Laterality: Right;  . RECTAL ULTRASOUND N/A 04/10/2014   Procedure: RECTAL ULTRASOUND;  Surgeon: Romie Levee, MD;  Location: [facility];  Service: Endoscopy;  Laterality: N/A;    OB History    Gravida Para Term Preterm AB Living   4  4       4   SAB TAB Ectopic Multiple Live Births                   Home Medications    Prior to Admission medications   Medication Sig Start Date End Date Taking? Authorizing Provider  acetaminophen (TYLENOL) 650 MG CR tablet Take 650 mg by mouth every 8 (eight) hours as needed for pain.    [provider]  amiodarone (PACERONE) 100 MG tablet Take 1 tablet (100 mg total) by mouth daily. 05/12/16   [place], Peter M, MD  Calcium Carb-Cholecalciferol (CALCIUM-VITAMIN D) 600-400 MG-UNIT TABS Take 1 tablet by mouth at bedtime.     [provider]  Ferrous  Sulfate (IRON) 325 (65 Fe) MG TABS Take 1 Dose by mouth daily. 10/20/16   Waldon Merl, PA-C  hydroxypropyl methylcellulose / hypromellose (ISOPTO TEARS / GONIOVISC) 2.5 % ophthalmic solution Place 1 drop into both eyes 4 (four) times daily as needed for dry eyes.    [provider]  levothyroxine (SYNTHROID, LEVOTHROID) 75 MCG tablet Take 75 mcg by mouth daily before breakfast.    [provider]  nitrofurantoin (MACRODANTIN) 50 MG capsule Take 50 mg by mouth daily with breakfast.  12/27/14   [provider]  pantoprazole (PROTONIX) 40 MG tablet Take 40 mg by mouth daily with breakfast.  01/10/13   Dorothea Ogle, MD  phenylephrine-shark liver oil-mineral oil-petrolatum (PREPARATION H) 0.25-3-14-71.9 % rectal ointment Place 1 application rectally 2 (two) times daily as needed for hemorrhoids.    [provider]  polyethylene glycol (MIRALAX / GLYCOLAX) packet Take by mouth daily as needed for mild constipation or moderate constipation.    [provider]  witch hazel-glycerin (TUCKS) pad Apply 1 application topically as needed for hemorrhoids.    [provider]    Family History Family History  Problem Relation Age of Onset  . Diabetes Father   . Leukemia Father   . Breast cancer Paternal Aunt   . Breast cancer Paternal Aunt   . Cancer Unknown        several family members on dad's side of family  . Heart failure Brother        Pt cannot confirm this history    Social History Social History  Substance Use Topics  . Smoking status: Never Smoker  . Smokeless tobacco: Never Used  . Alcohol use No     Allergies   Bactrim [sulfamethoxazole-trimethoprim]; Contrast media [iodinated diagnostic agents]; and Nsaids   Review of Systems Review of Systems  Unable to perform ROS: Patient unresponsive     Physical Exam Updated Vital Signs BP (!) 84/53   Pulse 84   Temp (!) 93.4 F (34.1 C) (Rectal)   Resp 17   SpO2 100%    Physical Exam  Constitutional: She appears toxic. She appears ill. She appears distressed. She is intubated.  HENT:  Head: Normocephalic.  Mouth/Throat: Mucous membranes are dry.  ET tube in place, 7.5, 24 at the lips.  Eyes:  Pupils 2 mm, reactive bilaterally.  Neck: Neck supple.  Cardiovascular: Normal rate and regular rhythm.   No murmur heard. Pulmonary/Chest: She is intubated.  Bilateral breath sounds. Mechanical breath sounds.  Abdominal: Soft. She exhibits no distension.  Musculoskeletal: She exhibits no edema.  Neurological: She is unresponsive.  Unable to participate neurological exam. Intermittently moving bilateral upper extremity's. Corneal reflex intact.  Skin: Skin is warm and dry.  Psychiatric: She  has a normal mood and affect.  Nursing note and vitals reviewed.    ED Treatments / Results  Labs (all labs ordered are listed, but only abnormal results are displayed) Labs Reviewed  COMPREHENSIVE METABOLIC PANEL - Abnormal; Notable for the following:       Result Value   Potassium 5.4 (*)    CO2 17 (*)    Glucose, Bld 207 (*)    BUN 23 (*)    Creatinine, Ser 1.89 (*)    Calcium 8.3 (*)    Albumin 3.1 (*)    AST 46 (*)    ALT 13 (*)    GFR calc non Af Amer 22 (*)    GFR calc Af Amer 25 (*)    All other components within normal limits  CBC WITH DIFFERENTIAL/PLATELET - Abnormal; Notable for the following:    WBC 19.6 (*)    MCHC 29.1 (*)    RDW 21.2 (*)    Neutro Abs 13.1 (*)    Lymphs Abs 4.5 (*)    Monocytes Absolute 1.8 (*)    All other components within normal limits  PROTIME-INR - Abnormal; Notable for the following:    Prothrombin Time 15.3 (*)    All other components within normal limits  I-STAT CG4 LACTIC ACID, ED - Abnormal; Notable for the following:    Lactic Acid, Venous 9.90 (*)    All other components within normal limits  I-STAT CHEM 8, ED - Abnormal; Notable for the following:    Potassium 5.2 (*)    BUN 27 (*)    Creatinine, Ser  1.80 (*)    Glucose, Bld 197 (*)    Calcium, Ion 1.01 (*)    All other components within normal limits  CULTURE, BLOOD (ROUTINE X 2)  CULTURE, BLOOD (ROUTINE X 2)  BRAIN NATRIURETIC PEPTIDE  URINALYSIS, ROUTINE W REFLEX MICROSCOPIC  I-STAT TROPOININ, ED  I-STAT TROPOININ, ED  I-STAT VENOUS BLOOD GAS, ED    EKG  EKG Interpretation  Date/Time:  Sunday Nov 28 2016 15:15:48 EDT Ventricular Rate:  83 PR Interval:    QRS Duration: 88 QT Interval:  383 QTC Calculation: 450 R Axis:   -82 Text Interpretation:  Sinus rhythm Prolonged PR interval Left anterior fascicular block `prominent t waves Confirmed by Denton Lank  MD, Caryn Bee ([id number]) on 11/27/2016 3:19:08 PM       Radiology Ct Head Wo Contrast  Result Date: 11/27/2016 CLINICAL DATA:  Iron deficiency anemia.  ETT. EXAM: CT HEAD WITHOUT CONTRAST TECHNIQUE: Contiguous axial images were obtained from the base of the skull through the vertex without intravenous contrast. COMPARISON:  None. FINDINGS: Brain: No subdural, epidural, or subarachnoid hemorrhages identified. Ventricles and sulci are prominent. An infarct in the left occipital lobe has a chronic appearance. Scattered white matter changes. No acute cortical ischemia infarct is identified. No mass, mass effect, or midline shift. A left cerebellar infarct with encephalomalacia is chronic. The brainstem and basal cisterns are normal. Vascular: Calcified atherosclerosis seen in the intracranial carotid arteries. Skull: Normal. Negative for fracture or focal lesion. Sinuses/Orbits: No acute finding. Other: None. 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 No acute abnormality identified. Electronically Signed   By: Gerome Sam III M.D   On: 11/24/2016 15:49   Dg Chest Port 1 View  Result Date: 11/13/2016 CLINICAL DATA:  83 year old female status post intubation. EXAM: PORTABLE CHEST 1 VIEW COMPARISON:  Chest x-ray 08/24/2014. FINDINGS: An  endotracheal tube is in place with  tip 2.0 cm above the carina. Lung volumes are low. No consolidative airspace disease. No pleural effusions. No suspicious appearing pulmonary nodules or masses. No pneumothorax. Large hiatal hernia. Heart size is normal. Aortic atherosclerosis. Upper mediastinal contours are within normal limits. IMPRESSION: 1. Tip of endotracheal tube is 2 cm above the carina and could be withdrawn approximately 2 cm for more optimal placement. 2. Aortic atherosclerosis. 3. Large hiatal hernia. Electronically Signed   By: Trudie Reedaniel  Entrikin M.D.   On: 12-26-16 16:02    Procedures Procedures (including critical care time)  Medications Ordered in ED Medications  fentaNYL (SUBLIMAZE) 100 MCG/2ML injection (not administered)  vancomycin (VANCOCIN) 1,500 mg in sodium chloride 0.9 % 500 mL IVPB (1,500 mg Intravenous New Bag/Given 05-Sep-2016 1641)  sodium chloride 0.9 % bolus 500 mL (500 mLs Intravenous New Bag/Given 05-Sep-2016 1534)  calcium gluconate 1 g in sodium chloride 0.9 % 100 mL IVPB (0 g Intravenous Stopped 05-Sep-2016 1642)  fentaNYL (SUBLIMAZE) injection 50 mcg (50 mcg Intravenous Given 05-Sep-2016 1534)  sodium chloride 0.9 % bolus 2,300 mL (2,300 mLs Intravenous New Bag/Given 05-Sep-2016 1641)  piperacillin-tazobactam (ZOSYN) IVPB 3.375 g (0 g Intravenous Stopped 05-Sep-2016 1642)     Initial Impression / Assessment and Plan / ED Course  I have reviewed the triage vital signs and the nursing notes.  Pertinent labs & imaging results that were available during my care of the patient were reviewed by me and considered in my medical decision making (see chart for details).     On initial arrival here, patient is intubated, hypertensive, GCS 3T. Patient hypothermic to 93.4. Concern for sepsis. Started on 30 mL/kg bolus and empiric antibiotics. Lactate nearly 10. We'll follow up on the next.  On reevaluation, patient has had improvement in her blood pressure. Mentation is improved. Following  commands. Moving all extremities equally. Her complaint at this time is pain from the ET tube.  Attempted to obtain a urine sample. Patient does have a history of a rectovaginal fistula, rectocele, cystocele. Nursing unable to obtain urine sample.   EKG with peaked T waves. Gave the patient IV calcium. Here serum potassium only 5.2. Creatinine 1.9, baseline appears to be 1.4.  CT of the head without any evidence of acute bleed. Does show evidence of old infarcts.  Spoke with family at bedside. The patient's daughter is the healthcare power of attorney, report that for now the patient is to be full code. We'll admit the patient to the intensivist.  Final diagnoses:  Altered mental status, unspecified altered mental status type  Severe sepsis [facility](HCC)    New Prescriptions New Prescriptions   No medications on file     Lindalou Hose'Rourke, Vermell Madrid, MD 11/29/16 [id number]    Cathren LaineSteinl, Kevin, MD 12/01/16 [phone]

## 2016-11-28 NOTE — Progress Notes (Signed)
Responded to page to ED for pt brought in unresponsive, intubated, no arrest. Pt's daughter Stanton Kidney, the only 1 of 3 siblings in town, was w/ pt, called EMS, is Marine scientist, and is Marine scientist at [employer]. Brother and sister are in [place] and [place], respectively.  Provided emotional/spiriitual support.,ministry of touch/presence, and prayer to daughter before and after 1st briefing by doctor, met w/ her and  pt's pastor, brought them into trauma bay to be w/ pt and await test results. Chaplain available for f/u.   11/25/2016 1600  Clinical Encounter Type  Visited With Family;Patient and family together;Health care provider;Other (Comment)  Visit Type Initial;Follow-up;Psychological support;Spiritual support;Social support;ED (pt's pastor)  Referral From Nurse  Spiritual Encounters  Spiritual Needs Ritual;Emotional;Grief support  Stress Factors  Patient Stress Factors Health changes;Loss of control  Family Stress Factors Family relationships;Health changes;Loss;Loss of control   Gerrit Heck, Chaplain

## 2016-11-28 NOTE — ED Triage Notes (Signed)
[facility] pt was walking down hallway and had "an abnormal" sensation in her legs was lowered to the floor. On [employer] arrival pt was unresponsive with gcs 3. pt has pulses with bp 80/40 pt was intubated with 7.5 ET 24 lip, 2mg  versed given en route.

## 2016-11-28 NOTE — ED Notes (Signed)
CCM at bedside 

## 2016-11-28 NOTE — Progress Notes (Addendum)
Pharmacy Antibiotic Note  Carla [name] is a 83 y.o. female admitted on February 20, 2017  from home after becoming unresponsive. Now with concern for intra-abdominal infection.   Pharmacy has been consulted for zosyn dosing.  Rec'd zosyn 3.375 g x1 and vanc 1500 mg x1 in ED.   Pt is hypothermic at 93.4, WBC up to 19.4, LA 9.90. SCr 1.8, CrCl 19, baseline CKD but not on HD.    Plan: Zosyn 2.25 g q6h to start 5/28 ~0030 Monitor renal function, clinical picture, WBC, temp F/u c/s data and ability to narrow    Temp (24hrs), Avg:93.4 F (34.1 C), Min:93.4 F (34.1 C), Max:93.4 F (34.1 C)   Recent Labs Lab 11/22/16 1422 06-17-2017 1530 06-17-2017 1533 06-17-2017 1534  WBC  --  19.6*  --   --   CREATININE 1.37* 1.89* 1.80*  --   LATICACIDVEN  --   --   --  9.90*    Estimated Creatinine Clearance: 19 mL/min (A) (by C-G formula based on SCr of 1.8 mg/dL (H)).    Allergies  Allergen Reactions  . Bactrim [Sulfamethoxazole-Trimethoprim] Other (See Comments)    Has chronic kidney disease.  This is not recommended by nephrologist.    . Contrast Media [Iodinated Diagnostic Agents] Other (See Comments)    Affected her labs for her kidneys   . Nsaids Other (See Comments)    Has chronic kidney disease.  This is not recommended by nephrologist.    Antimicrobials this admission: vanc 5/27 >> x1 in ED zosyn 5/27 >>   Dose adjustments this admission: n/a  Microbiology results: 5/27 BCx: sent   Thank you for allowing pharmacy to be a part of this patient's care.  Allena Katzaroline E Welles, Pharm.D. PGY1 Pharmacy Resident August 19, 20186:19 PM Main Rx [phone]

## 2016-11-28 NOTE — Progress Notes (Signed)
This note also relates to the following rows which could not be included: SpO2 - Cannot attach notes to unvalidated device data  RT called to pt's room for suctioning.  RT suctioned oral and ETT. Family present.

## 2016-11-28 NOTE — H&P (Signed)
BRISA AUTH is an 83 y.o. female.   Chief Complaint: weakness, being cold clammy  HPI: history is obtained from patient's daughter as patient is intubated   She was walking to her daughter and became suddenly cold clammy and weak and sat down. She made her lie down and rest. After sometime same episode happened again but this time she became unresponsive. So she called EMS. EMS intuabted her for GCS 3  daughter mentions patient complaining of pain across both shoulders both the times. No chest pain or sob. Had some dizziness. She ahs not been eating or drinking much lately. 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 Other than shoulder pain, she denies any other complaints.   Past Medical History:  Diagnosis Date  . Atrial fibrillation ([place])   . Cataracts, bilateral   . Chronic kidney disease    chronic kidney disease  . Cystocele   . Diverticulosis   . Dysrhythmia    A- fib  . Encounter for fitting and adjustment of pessary 04/23/2002  . Female bladder prolapse   . Gallstones   . Hemorrhoids   . Hiatal hernia   . High cholesterol   . Hyperkalemia   . Hyperlipidemia   . Hypothyroidism   . Iron deficiency anemia   . Leukocytosis   . Lordosis   . Osteoarthritis   . Osteoarthritis   . Osteoporosis   . Pneumonia    hx aspiration pneumonia  . Rectocele   . Renal insufficiency   . Scoliosis   . Urinary tract infection without hematuria     Past Surgical History:  Procedure Laterality Date  . CATARACT EXTRACTION    . CHOLECYSTECTOMY    . COLONOSCOPY    . ENDOMETRIAL BIOPSY  09/2009  . ESOPHAGOGASTRODUODENOSCOPY    . ESOPHAGOGASTRODUODENOSCOPY N/A 01/09/2013   Procedure: ESOPHAGOGASTRODUODENOSCOPY (EGD);  Surgeon: Winfield Cunas., MD;  Location: [facility];  Service: Endoscopy;  Laterality: N/A;  . INTRAMEDULLARY (IM) NAIL INTERTROCHANTERIC Right 12/13/2015   Procedure:  INTRAMEDULLARY (IM) NAIL INTERTROCHANTRIC;  Surgeon: Rod Can, MD;  Location: [facility];  Service: Orthopedics;  Laterality: Right;  . RECTAL ULTRASOUND N/A 04/10/2014   Procedure: RECTAL ULTRASOUND;  Surgeon: Leighton Ruff, MD;  Location: [facility];  Service: Endoscopy;  Laterality: N/A;    Family History  Problem Relation Age of Onset  . Diabetes Father   . Leukemia Father   . Breast cancer Paternal Aunt   . Breast cancer Paternal Aunt   . Cancer Unknown        several family members on dad's side of family  . Heart failure Brother        Pt cannot confirm this history   Social History:  reports that she has never smoked. She has never used smokeless tobacco. She reports that she does not drink alcohol or use drugs.  Allergies:  Allergies  Allergen Reactions  . Bactrim [Sulfamethoxazole-Trimethoprim] Other (See Comments)    Has chronic kidney disease.  This is not recommended by nephrologist.    . Contrast Media [Iodinated Diagnostic Agents] Other (See Comments)    Affected her labs for her kidneys   . Nsaids Other (See Comments)    Has chronic kidney disease.  This is not recommended by nephrologist.     (Not in a hospital admission)  Results for orders placed or performed during the hospital encounter of 12/02/2016 (from the past 48 hour(s))  Comprehensive metabolic panel     Status: Abnormal   Collection Time: 11/02/2016  3:30 PM  Result Value Ref Range   Sodium 138 135 - 145 mmol/L   Potassium 5.4 (H) 3.5 - 5.1 mmol/L   Chloride 107 101 - 111 mmol/L   CO2 17 (L) 22 - 32 mmol/L   Glucose, Bld 207 (H) 65 - 99 mg/dL   BUN 23 (H) 6 - 20 mg/dL   Creatinine, Ser 1.89 (H) 0.44 - 1.00 mg/dL   Calcium 8.3 (L) 8.9 - 10.3 mg/dL   Total Protein 6.5 6.5 - 8.1 g/dL   Albumin 3.1 (L) 3.5 - 5.0 g/dL   AST 46 (H) 15 - 41 U/L   ALT 13 (L) 14 - 54 U/L   Alkaline Phosphatase 63 38 - 126 U/L   Total Bilirubin 0.4 0.3 - 1.2 mg/dL   GFR calc non Af Amer 22 (L) >60 mL/min   GFR calc  Af Amer 25 (L) >60 mL/min    Comment: (NOTE) The eGFR has been calculated using the CKD EPI equation. This calculation has not been validated in all clinical situations. eGFR's persistently <60 mL/min signify possible Chronic Kidney Disease.    Anion gap 14 5 - 15  CBC with Differential     Status: Abnormal   Collection Time: 11/29/2016  3:30 PM  Result Value Ref Range   WBC 19.6 (H) 4.0 - 10.5 K/uL   RBC 4.62 3.87 - 5.11 MIL/uL   Hemoglobin 12.4 12.0 - 15.0 g/dL   HCT 42.6 36.0 - 46.0 %   MCV 92.2 78.0 - 100.0 fL   MCH 26.8 26.0 - 34.0 pg   MCHC 29.1 (L) 30.0 - 36.0 g/dL   RDW 21.2 (H) 11.5 - 15.5 %   Platelets 169 150 - 400 K/uL   Neutrophils Relative % 67 %   Lymphocytes Relative 23 %   Monocytes Relative 9 %   Eosinophils Relative 1 %   Basophils Relative 0 %   Neutro Abs 13.1 (H) 1.7 - 7.7 K/uL   Lymphs Abs 4.5 (H) 0.7 - 4.0 K/uL   Monocytes Absolute 1.8 (H) 0.1 - 1.0 K/uL   Eosinophils Absolute 0.2 0.0 - 0.7 K/uL   Basophils Absolute 0.0 0.0 - 0.1 K/uL   Smear Review MORPHOLOGY UNREMARKABLE   Protime-INR     Status: Abnormal   Collection Time: 11/14/2016  3:30 PM  Result Value Ref Range   Prothrombin Time 15.3 (H) 11.4 - 15.2 seconds   INR 1.20   I-stat chem 8, ed     Status: Abnormal   Collection Time: 11/17/2016  3:33 PM  Result Value Ref Range   Sodium 139 135 - 145 mmol/L   Potassium 5.2 (H) 3.5 - 5.1 mmol/L   Chloride 108 101 - 111 mmol/L   BUN 27 (H) 6 - 20 mg/dL   Creatinine, Ser 1.80 (H) 0.44 - 1.00 mg/dL   Glucose, Bld 197 (H) 65 - 99 mg/dL   Calcium, Ion 1.01 (L) 1.15 - 1.40 mmol/L   TCO2 18 0 - 100 mmol/L   Hemoglobin 15.0 12.0 - 15.0 g/dL   HCT 44.0 36.0 - 46.0 %  I-Stat CG4 Lactic Acid, ED     Status: Abnormal   Collection Time: 11/23/2016  3:34 PM  Result Value Ref Range   Lactic Acid, Venous 9.90 (HH) 0.5 - 1.9 mmol/L   Comment NOTIFIED PHYSICIAN   I-stat troponin, ED     Status: None   Collection  Time: 11/15/2016  3:43 PM  Result Value Ref Range    Troponin i, poc 0.02 0.00 - 0.08 ng/mL   Comment 3            Comment: Due to the release kinetics of cTnI, a negative result within the first hours of the onset of symptoms does not rule out myocardial infarction with certainty. If myocardial infarction is still suspected, repeat the test at appropriate intervals.    Ct Head Wo Contrast  Result Date: 11/27/2016 CLINICAL DATA:  Iron deficiency anemia.  ETT. EXAM: CT HEAD WITHOUT CONTRAST TECHNIQUE: Contiguous axial images were obtained from the base of the skull through the vertex without intravenous contrast. COMPARISON:  None. FINDINGS: Brain: No subdural, epidural, or subarachnoid hemorrhages identified. Ventricles and sulci are prominent. An infarct in the left occipital lobe has a chronic appearance. Scattered white matter changes. No acute cortical ischemia infarct is identified. No mass, mass effect, or midline shift. A left cerebellar infarct with encephalomalacia is chronic. The brainstem and basal cisterns are normal. Vascular: Calcified atherosclerosis seen in the intracranial carotid arteries. Skull: Normal. Negative for fracture or focal lesion. Sinuses/Orbits: No acute finding. Other: None. 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 No acute abnormality identified. Electronically Signed   By: Dorise Bullion III M.D   On: 11/26/2016 15:49   Dg Chest Port 1 View  Result Date: 11/15/2016 CLINICAL DATA:  83 year old female status post intubation. EXAM: PORTABLE CHEST 1 VIEW COMPARISON:  Chest x-ray 08/24/2014. FINDINGS: An endotracheal tube is in place with tip 2.0 cm above the carina. Lung volumes are low. No consolidative airspace disease. No pleural effusions. No suspicious appearing pulmonary nodules or masses. No pneumothorax. Large hiatal hernia. Heart size is normal. Aortic atherosclerosis. Upper mediastinal contours are within normal limits. IMPRESSION: 1. Tip of endotracheal tube is  2 cm above the carina and could be withdrawn approximately 2 cm for more optimal placement. 2. Aortic atherosclerosis. 3. Large hiatal hernia. Electronically Signed   By: Vinnie Langton M.D.   On: 11/12/2016 16:02    Review of Systems  All other systems reviewed and are negative.   Blood pressure 106/68, pulse 81, temperature (!) 93.4 F (34.1 C), temperature source Rectal, resp. rate 17, SpO2 100 %. Physical Exam  Vitals reviewed. Constitutional: She has a sickly appearance. She is intubated.  HENT:  Head: Normocephalic and atraumatic.  Right Ear: External ear normal.  Left Ear: External ear normal.  Mouth/Throat: Oropharynx is clear and moist.  Eyes: Conjunctivae and EOM are normal. Pupils are equal, round, and reactive to light. Right eye exhibits no discharge.  Neck: Normal range of motion. Neck supple.  Cardiovascular: Normal rate, regular rhythm and normal heart sounds.   No murmur heard. Respiratory: Effort normal and breath sounds normal. She is intubated. No respiratory distress. She has no wheezes. She has no rales.  GI: Bowel sounds are normal. She exhibits no distension. There is no tenderness. There is no rebound.  Musculoskeletal: Normal range of motion. She exhibits deformity. She exhibits no edema or tenderness.  Neurological: She is alert. Coordination normal.  Skin: Skin is dry.  Psychiatric: Her behavior is normal.     Assessment/Plan  Admit to icu I suspect her presentation is due to dehydration and got orthostatic when trying to stand up leading to becoming cold clammy and grey. She is responding well to fluids Initiate sepsis bundle Empiric antibiotics.  Also she has history of PE, and DVT and  has a IVC filter. She is off anticoagulation (Eliquis) since march 2018 due to GI bleed.  Her presentation can be due to PE also, will get VQ scan since Cr elevated and get Echo. If it shows PE, will need discussion with the family regarding risks and benefits of  anticoagulation.  GI and DVT Px. Continue amiodarone  She is following commands, potentially weanable in morning once other active issues ruled out.   D/w at length with patient's daughter  She is full code.  The patient is critically ill with multiple organ systems failure and requires high complexity decision making for assessment and support, frequent evaluation and titration of therapies, application of advanced monitoring technologies and extensive interpretation of multiple databases.   Critical Care Time devoted to patient care services described in this note is 40  Minutes. This time reflects time of care of this signee: Sharia Reeve, MD.   Sharia Reeve, MD 11/27/2016, 6:00 PM

## 2016-11-28 NOTE — Progress Notes (Signed)
Pt transported on vent from ED to 2H23.  Pt's vitals remained stable throughout.

## 2016-11-28 NOTE — Progress Notes (Signed)
VBG results given to Dr. Carolan Clines'Rourke.  Verbal orders executed.

## 2016-11-28 NOTE — Progress Notes (Signed)
eLink Physician-Brief Progress Note Patient Name: Carla [name] DOB: 1922-05-06 MRN: [id number]   Date of Service  11/26/2016  HPI/Events of Note  Called by nurse as patient has V/Q ordered.  Patient with elevated creat and Ct angio of chest cannot be done.  She has hx of prior thromboembolic event and she has filter in place.  She has been off of anticoag since March for GI bleed.  Nurse notes significant amount of blood in stool.  eICU Interventions  We are going to hold off on V/Q for now.  Already has filter and significant concern for active bleeding so cannot anticoagulate at thsi time.       Intervention Category Intermediate Interventions: Other:  Henry RusselSMITH, Norberto Wishon, Demetrius Charity 11/26/2016, 7:40 PM

## 2016-11-28 NOTE — ED Notes (Signed)
Family at bedside. 

## 2016-11-29 ENCOUNTER — Encounter (HOSPITAL_COMMUNITY): Payer: Self-pay | Admitting: *Deleted

## 2016-11-29 ENCOUNTER — Inpatient Hospital Stay (HOSPITAL_COMMUNITY): Payer: Medicare Other

## 2016-11-29 DIAGNOSIS — J9600 Acute respiratory failure, unspecified whether with hypoxia or hypercapnia: Secondary | ICD-10-CM

## 2016-11-29 LAB — BLOOD GAS, ARTERIAL
Acid-base deficit: 10.4 mmol/L — ABNORMAL HIGH (ref 0.0–2.0)
BICARBONATE: 15.8 mmol/L — AB (ref 20.0–28.0)
Drawn by: 437071
FIO2: 50
O2 Saturation: 98.5 %
PATIENT TEMPERATURE: 97.8
PCO2 ART: 39 mmHg (ref 32.0–48.0)
PEEP: 5 cmH2O
RATE: 12 resp/min
VT: 300 mL
pH, Arterial: 7.228 — ABNORMAL LOW (ref 7.350–7.450)
pO2, Arterial: 146 mmHg — ABNORMAL HIGH (ref 83.0–108.0)

## 2016-11-29 LAB — BLOOD CULTURE ID PANEL (REFLEXED)
ACINETOBACTER BAUMANNII: NOT DETECTED
CANDIDA ALBICANS: NOT DETECTED
CANDIDA GLABRATA: NOT DETECTED
CANDIDA KRUSEI: NOT DETECTED
CANDIDA PARAPSILOSIS: NOT DETECTED
Candida tropicalis: NOT DETECTED
ENTEROBACTER CLOACAE COMPLEX: NOT DETECTED
ENTEROBACTERIACEAE SPECIES: NOT DETECTED
ESCHERICHIA COLI: NOT DETECTED
Enterococcus species: NOT DETECTED
Haemophilus influenzae: NOT DETECTED
KLEBSIELLA OXYTOCA: NOT DETECTED
Klebsiella pneumoniae: NOT DETECTED
LISTERIA MONOCYTOGENES: NOT DETECTED
Neisseria meningitidis: NOT DETECTED
Proteus species: NOT DETECTED
Pseudomonas aeruginosa: NOT DETECTED
STREPTOCOCCUS PNEUMONIAE: NOT DETECTED
STREPTOCOCCUS PYOGENES: NOT DETECTED
Serratia marcescens: NOT DETECTED
Staphylococcus aureus (BCID): NOT DETECTED
Staphylococcus species: NOT DETECTED
Streptococcus agalactiae: NOT DETECTED
Streptococcus species: NOT DETECTED

## 2016-11-29 LAB — BASIC METABOLIC PANEL
Anion gap: 14 (ref 5–15)
BUN: 29 mg/dL — AB (ref 6–20)
CALCIUM: 7.5 mg/dL — AB (ref 8.9–10.3)
CHLORIDE: 116 mmol/L — AB (ref 101–111)
CO2: 14 mmol/L — ABNORMAL LOW (ref 22–32)
CREATININE: 2.4 mg/dL — AB (ref 0.44–1.00)
GFR, EST AFRICAN AMERICAN: 19 mL/min — AB (ref >60)
GFR, EST NON AFRICAN AMERICAN: 16 mL/min — AB (ref >60)
Glucose, Bld: 116 mg/dL — ABNORMAL HIGH (ref 65–99)
Potassium: 5 mmol/L (ref 3.5–5.1)
SODIUM: 144 mmol/L (ref 135–145)

## 2016-11-29 LAB — GLUCOSE, CAPILLARY
GLUCOSE-CAPILLARY: 128 mg/dL — AB (ref 65–99)
GLUCOSE-CAPILLARY: 151 mg/dL — AB (ref 65–99)
Glucose-Capillary: 106 mg/dL — ABNORMAL HIGH (ref 65–99)
Glucose-Capillary: 123 mg/dL — ABNORMAL HIGH (ref 65–99)
Glucose-Capillary: 118 mg/dL — ABNORMAL HIGH (ref 65–99)
Glucose-Capillary: 139 mg/dL — ABNORMAL HIGH (ref 65–99)

## 2016-11-29 LAB — CBC
HCT: 45.8 % (ref 36.0–46.0)
Hemoglobin: 13.5 g/dL (ref 12.0–15.0)
MCH: 28 pg (ref 26.0–34.0)
MCHC: 29.5 g/dL — AB (ref 30.0–36.0)
MCV: 95 fL (ref 78.0–100.0)
PLATELETS: 135 10*3/uL — AB (ref 150–400)
RBC: 4.82 MIL/uL (ref 3.87–5.11)
RDW: 21.6 % — ABNORMAL HIGH (ref 11.5–15.5)
WBC: 24.6 10*3/uL — ABNORMAL HIGH (ref 4.0–10.5)

## 2016-11-29 LAB — MAGNESIUM: Magnesium: 1.9 mg/dL (ref 1.7–2.4)

## 2016-11-29 LAB — PHOSPHORUS: PHOSPHORUS: 4.9 mg/dL — AB (ref 2.5–4.6)

## 2016-11-29 LAB — ABO/RH: ABO/RH(D): O NEG

## 2016-11-29 MED ORDER — FENTANYL CITRATE (PF) 100 MCG/2ML IJ SOLN
25.0000 ug | INTRAMUSCULAR | Status: DC | PRN
  Administered 2016-11-29 – 2016-11-30 (×3): 25 ug via INTRAVENOUS
  Filled 2016-11-29 (×3): qty 2

## 2016-11-29 MED ORDER — SODIUM BICARBONATE 8.4 % IV SOLN
INTRAVENOUS | Status: DC
  Administered 2016-11-29 – 2016-11-30 (×3): via INTRAVENOUS
  Filled 2016-11-29 (×8): qty 150

## 2016-11-29 MED ORDER — ZINC OXIDE 12.8 % EX OINT
TOPICAL_OINTMENT | CUTANEOUS | Status: DC | PRN
  Administered 2016-11-29 – 2016-11-30 (×3): 1 via TOPICAL
  Filled 2016-11-29: qty 56.7

## 2016-11-29 MED ORDER — VANCOMYCIN HCL IN DEXTROSE 1-5 GM/200ML-% IV SOLN
1000.0000 mg | INTRAVENOUS | Status: DC
  Filled 2016-11-29: qty 200

## 2016-11-29 NOTE — Progress Notes (Signed)
Initial Nutrition Assessment  DOCUMENTATION CODES:   Not applicable  INTERVENTION:    Advance diet as medically appropriate, add interventions accordingly  NUTRITION DIAGNOSIS:   Inadequate oral intake related to inability to eat as evidenced by NPO status  GOAL:   Patient will meet greater than or equal to 90% of their needs  MONITOR:   Diet advancement, PO intake, Supplement acceptance, Labs, Weight trends, Skin, I & O's  REASON FOR ASSESSMENT:   Malnutrition Screening Tool  ASSESSMENT:   83 yo Female brought to ER with altered mental status.  She was recently dx with Rt leg DVT.  She has IVC filter.  She has hx of rectourethral fistula with intermittent bleeding.    Rt extubated this AM.  Remains NPO. Per Malnutrition Screening Tool Report, pt has been eating poorly because of a decreased appetite. Also with recent weight loss without trying.  (4/17) 169 lb ---> (5/21) 167 lb ---> (5/28) 190 lb.  ? accuracy.   Medications reviewed.  Labs reviewed.  Phos 4.9 (H). CBG's 106-123-128.  RD unable to complete Nutrition-Focused physical exam at this time.   Diet Order:  Diet NPO time specified  Skin:  Wound (see comment) ( blanchable purple skin bilateral lower buttocks/labia)  Last BM:  5/28  Height:   Ht Readings from Last 1 Encounters:  06/09/17 5\' 2"  (1.575 m)   Weight:   Wt Readings from Last 1 Encounters:  11/29/16 190 lb 7.6 oz (86.4 kg)   Wt Readings from Last 10 Encounters:  11/29/16 190 lb 7.6 oz (86.4 kg)  11/22/16 167 lb (75.8 kg)  10/19/16 169 lb (76.7 kg)  09/20/16 173 lb 3.2 oz (78.6 kg)  09/02/16 171 lb 8.3 oz (77.8 kg)  08/13/16 173 lb (78.5 kg)  07/15/16 172 lb (78 kg)  04/01/16 167 lb 3.2 oz (75.8 kg)  01/13/16 183 lb 12.8 oz (83.4 kg)  01/01/16 183 lb 12.8 oz (83.4 kg)   Ideal Body Weight:  50 kg  BMI:  Body mass index is 34.84 kg/m.  Estimated Nutritional Needs:   Kcal:  1500-1700  Protein:  75-90 gm  Fluid:  >/= 1.5  L  EDUCATION NEEDS:   No education needs identified at this time  Maureen ChattersKatie Conni Knighton, RD, LDN Pager #: [phone] After-Hours Pager #: [phone]

## 2016-11-29 NOTE — Progress Notes (Signed)
Dr. Katrinka BlazingSmith (elink) updated on patient condition including vital signs, GI bleeding, labs (critical lactate and Troponin); orders received

## 2016-11-29 NOTE — Progress Notes (Signed)
PCCM Progress Note  Admission date: 12/02/2016 Referring provider: Dr. Carolan Clines, ER  CC: Altered mental status  HPI: 83 yo female brought to ER with altered mental status.  She was recently dx with Rt leg DVT.  She has IVC filter.  She has hx of rectourethral fistula with intermittent bleeding.    Subjective: Tolerating SBT.  Denies chest pain.  RN notes intermittent bleeding from vagina.  Vital signs: BP (!) 92/51   Pulse 94   Temp 97.9 F (36.6 C) (Oral)   Resp (!) 26   Ht 5\' 2"  (1.575 m)   Wt 190 lb 7.6 oz (86.4 kg)   SpO2 100%   BMI 34.84 kg/m   Intake/output: I/O last 3 completed shifts: In: 4115 [I.V.:1025; NG/GT:30; IV Piggyback:3060] Out: -   General: alert Neuro: follows commands HEENT: ETT in place Cardiac: regular Chest: no wheeze Abd: soft, non tender Ext: 1+ edema Skin: no rashes   CMP Latest Ref Rng & Units 11/29/2016 11/09/2016 11/20/2016  Glucose 65 - 99 mg/dL 161(W) 960(A) 540(J)  BUN 6 - 20 mg/dL 81(X) 91(Y) 78(G)  Creatinine 0.44 - 1.00 mg/dL 9.56(O) 1.30(Q) 6.57(Q)  Sodium 135 - 145 mmol/L 144 136 139  Potassium 3.5 - 5.1 mmol/L 5.0 5.3(H) 5.2(H)  Chloride 101 - 111 mmol/L 116(H) 111 108  CO2 22 - 32 mmol/L 14(L) 12(L) -  Calcium 8.9 - 10.3 mg/dL 7.5(L) 7.7(L) -  Total Protein 6.5 - 8.1 g/dL - 6.5 -  Total Bilirubin 0.3 - 1.2 mg/dL - 0.7 -  Alkaline Phos 38 - 126 U/L - 65 -  AST 15 - 41 U/L - 68(H) -  ALT 14 - 54 U/L - 26 -     CBC Latest Ref Rng & Units 11/29/2016 11/17/2016 11/25/2016  WBC 4.0 - 10.5 K/uL 24.6(H) 28.7(H) -  Hemoglobin 12.0 - 15.0 g/dL 46.9 62.9 52.8  Hematocrit 36.0 - 46.0 % 45.8 45.5 44.0  Platelets 150 - 400 K/uL 135(L) 175 -     ABG    Component Value Date/Time   PHART 7.228 (L) 11/29/2016 0400   PCO2ART 39.0 11/29/2016 0400   PO2ART 146 (H) 11/29/2016 0400   HCO3 15.8 (L) 11/29/2016 0400   TCO2 18 11/24/2016 1533   ACIDBASEDEF 10.4 (H) 11/29/2016 0400   O2SAT 98.5 11/29/2016 0400     CBG (last 3)    Recent Labs  11/29/16 0000 11/29/16 0356 11/29/16 0750  GLUCAP 151* 106* 123*     Imaging: Ct Head Wo Contrast  Result Date: 11/03/2016 CLINICAL DATA:  Iron deficiency anemia.  ETT. EXAM: CT HEAD WITHOUT CONTRAST TECHNIQUE: Contiguous axial images were obtained from the base of the skull through the vertex without intravenous contrast. COMPARISON:  None. FINDINGS: Brain: No subdural, epidural, or subarachnoid hemorrhages identified. Ventricles and sulci are prominent. An infarct in the left occipital lobe has a chronic appearance. Scattered white matter changes. No acute cortical ischemia infarct is identified. No mass, mass effect, or midline shift. A left cerebellar infarct with encephalomalacia is chronic. The brainstem and basal cisterns are normal. Vascular: Calcified atherosclerosis seen in the intracranial carotid arteries. Skull: Normal. Negative for fracture or focal lesion. Sinuses/Orbits: No acute finding. Other: None. 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 No acute abnormality identified. Electronically Signed   By: Gerome Sam III M.D   On: 11/10/2016 15:49   Dg Chest Port 1 View  Result Date: 11/29/2016 CLINICAL DATA:  Check endotracheal tube position EXAM: PORTABLE  CHEST 1 VIEW COMPARISON:  July 13, 2016 FINDINGS: Endotracheal tube is noted 1 cm above the carina. Nasogastric catheter is noted over the mid chest just above the large hiatal hernia. A Large hiatal hernia is seen. Cardiac shadow is stable. No focal infiltrate or sizable effusion is seen. No bony abnormality is noted. IMPRESSION: Tubes and lines as described. Large hiatal hernia stable from the previous exam. Electronically Signed   By: Alcide CleverMark  Lukens M.D.   On: 11/29/2016 07:26   Dg Chest Port 1 View  Result Date: October 31, 2016 CLINICAL DATA:  83 year old female status post intubation. EXAM: PORTABLE CHEST 1 VIEW COMPARISON:  Chest x-ray 08/24/2014. FINDINGS: An  endotracheal tube is in place with tip 2.0 cm above the carina. Lung volumes are low. No consolidative airspace disease. No pleural effusions. No suspicious appearing pulmonary nodules or masses. No pneumothorax. Large hiatal hernia. Heart size is normal. Aortic atherosclerosis. Upper mediastinal contours are within normal limits. IMPRESSION: 1. Tip of endotracheal tube is 2 cm above the carina and could be withdrawn approximately 2 cm for more optimal placement. 2. Aortic atherosclerosis. 3. Large hiatal hernia. Electronically Signed   By: Trudie Reedaniel  Entrikin M.D.   On: July 13, 2016 16:02     Studies: Rt leg u/s 5/21 >> DVT distal femoral to popliteal vein, Rt greater saphenous vein CT chest 5/27 >> old Lt occipital and Lt cerebellar infarcts Echo 5/28 >>  Antibiotics: Vancomycin 5/27 >> Zosyn 5/27 >>  Cultures: Blood 5/27 >> GPR >>  Lines/tubes: ETT 5/27 >> 5/28   Events: 5/27 Admit  Assessment/plan:  Acute respiratory failure with hypoxia in setting of syncope. - extubate  Syncope with recent dx of Rt leg DVT s/p filter. - f/u Echo - no anticoagulation in setting of vaginal bleeding  Hypotension. - sepsis, hypovolemia, possible PE - continue IV fluids - continue Abx  Hx of A fib. - amiodarone  Acute renal failure. Metabolic acidosis. Lactic acidosis. - add HCO3 to IV fluid - f/u BMET  Rectourethral fistula. - not surgical candidate  Hx of hypothyroidism. - synthroid  DVT prophylaxis - SCDs SUP - protonix Nutrition - NPO Goals of care - okay with short term extubation.  No CPR, no defibrillation  Updated pt's daughter, Corrie DandyMary, at bedside  CC time 32 minutes  Coralyn HellingVineet Derral Colucci, MD [facility] 11/29/2016, 9:43 AM Pager:  [phone] After 3pm call: [phone]

## 2016-11-29 NOTE — Progress Notes (Signed)
PHARMACY - PHYSICIAN COMMUNICATION CRITICAL VALUE ALERT - BLOOD CULTURE IDENTIFICATION (BCID)  Results for orders placed or performed during the hospital encounter of 11/22/2016  Blood Culture ID Panel (Reflexed) (Collected: 11/18/2016  4:30 PM)  Result Value Ref Range   Enterococcus species NOT DETECTED NOT DETECTED   Listeria monocytogenes NOT DETECTED NOT DETECTED   Staphylococcus species NOT DETECTED NOT DETECTED   Staphylococcus aureus NOT DETECTED NOT DETECTED   Streptococcus species NOT DETECTED NOT DETECTED   Streptococcus agalactiae NOT DETECTED NOT DETECTED   Streptococcus pneumoniae NOT DETECTED NOT DETECTED   Streptococcus pyogenes NOT DETECTED NOT DETECTED   Acinetobacter baumannii NOT DETECTED NOT DETECTED   Enterobacteriaceae species NOT DETECTED NOT DETECTED   Enterobacter cloacae complex NOT DETECTED NOT DETECTED   Escherichia coli NOT DETECTED NOT DETECTED   Klebsiella oxytoca NOT DETECTED NOT DETECTED   Klebsiella pneumoniae NOT DETECTED NOT DETECTED   Proteus species NOT DETECTED NOT DETECTED   Serratia marcescens NOT DETECTED NOT DETECTED   Haemophilus influenzae NOT DETECTED NOT DETECTED   Neisseria meningitidis NOT DETECTED NOT DETECTED   Pseudomonas aeruginosa NOT DETECTED NOT DETECTED   Candida albicans NOT DETECTED NOT DETECTED   Candida glabrata NOT DETECTED NOT DETECTED   Candida krusei NOT DETECTED NOT DETECTED   Candida parapsilosis NOT DETECTED NOT DETECTED   Candida tropicalis NOT DETECTED NOT DETECTED   Lab called with 1/4 (Anaerobic) bottles with Gram positive rods, nothing detected on BCID. Pt is already on zosyn, s/p one dose vanc in ED.   Name of physician (or Provider) Contacted: Dr. Craige CottaSood   Changes to prescribed antibiotics required: none   Allena KatzCaroline E Ghalia Reicks 11/29/2016  7:19 AM

## 2016-11-29 NOTE — Consult Note (Signed)
WOC Nurse wound consult note Reason for Consult: buttocks, large area of dark purple skin Wound type: blanchable purple skin bilateral lower buttocks/labia Appears to be pooling of blood instead of pressure due to bilateral and extension into the labia.  Skin blanches briskly Pressure Injury POA/No Wound bed: intact skin, hemorrhoids present Drainage (amount, consistency, odor) vaginal bleeding, patient known to have colovesical fistula. Per bedside nurse [facility], no surgical intervention recommended at this time Periwound: intact, some mild maceration, patient wears incontinence briefs at home. She is incontinent of bowl and bladder.  She is on longterm macrobid  Dressing procedure/placement/frequency: Use foam to protect skin, or use zinc based barrier for patient comfort.  Patient has been using Desitin at home with some relief.   Discussed POC with patient and bedside nurse.  Re consult if needed, will not follow at this time. Thanks  Wafaa Deemer M.D.[employer] MSN, RN,CWOCN, CNS [phone])

## 2016-11-29 NOTE — Procedures (Signed)
Extubation Procedure Note  Patient Details:   Name: Carla [name] DOB: Dec 24, 1921 MRN: [id number]   Airway Documentation:     Evaluation  O2 sats: stable throughout Complications: No apparent complications Patient did tolerate procedure well. Bilateral Breath Sounds: Clear   Yes   Pt. Was extubated to a 2L [place] without any complications, dyspnea or stridor noted. Pt. Was instructed on IS x 5, highest goal achieved was 500mL.  Carlynn SpryCobb, Carla [name] 11/29/2016, 09:37 AM

## 2016-11-29 NOTE — Progress Notes (Signed)
Pharmacy Antibiotic Note  Carla [name] is a 83 y.o. female admitted on 11/05/2016  from home after becoming unresponsive. Now with concern for intra-abdominal infection.   Pharmacy has been consulted for zosyn and vancomycin dosing.  Rec'd zosyn 3.375 g x1 and vanc 1500 mg x1 in ED.   Pt is hypothermic at 93.4, WBC up 24.6, 19.4, LA 9.90. SCr rising, baseline CKD but not on HD.    Plan: Con't Zosyn 2.25 g q6h Vancomycin 1g IV q 48 hrs - next dose tomorrow.  Monitor renal function, clinical picture, WBC, temp F/u c/s data and ability to narrow  Height: 5\' 2"  (157.5 cm) Weight: 190 lb 7.6 oz (86.4 kg) IBW/kg (Calculated) : 50.1  Temp (24hrs), Avg:96.7 F (35.9 C), Min:93.4 F (34.1 C), Max:97.9 F (36.6 C)   Recent Labs Lab 11/22/16 1422 11/15/2016 1530 11/12/2016 1533 11/10/2016 1534 11/24/2016 1859 11/10/2016 2216 11/29/16 0421  WBC  --  19.6*  --   --  28.7*  --  24.6*  CREATININE 1.37* 1.89* 1.80*  --  2.06*  --  2.40*  LATICACIDVEN  --   --   --  9.90* 4.9* 3.3*  --     Estimated Creatinine Clearance: 15.3 mL/min (A) (by C-G formula based on SCr of 2.4 mg/dL (H)).    Allergies  Allergen Reactions  . Bactrim [Sulfamethoxazole-Trimethoprim] Other (See Comments)    Has chronic kidney disease.  This is not recommended by nephrologist.    . Contrast Media [Iodinated Diagnostic Agents] Other (See Comments)    Affected her labs for her kidneys   . Nsaids Other (See Comments)    Has chronic kidney disease.  This is not recommended by nephrologist.    Antimicrobials this admission: vanc 5/27 >> x1 in ED zosyn 5/27 >>   Dose adjustments this admission: n/a  Microbiology results: 5/27 BCx: sent   Thank you for allowing pharmacy to be a part of this patient's care.  Sabriyah Wilcher, Gwenlyn FoundJessica C, Pharm.D. PGY1 Pharmacy Resident 5/28/201810:39 AM [facility] [phone]

## 2016-11-30 ENCOUNTER — Inpatient Hospital Stay (HOSPITAL_COMMUNITY): Payer: Medicare Other

## 2016-11-30 DIAGNOSIS — R6520 Severe sepsis without septic shock: Secondary | ICD-10-CM

## 2016-11-30 DIAGNOSIS — R0602 Shortness of breath: Secondary | ICD-10-CM

## 2016-11-30 DIAGNOSIS — R4182 Altered mental status, unspecified: Secondary | ICD-10-CM

## 2016-11-30 DIAGNOSIS — Z978 Presence of other specified devices: Secondary | ICD-10-CM

## 2016-11-30 DIAGNOSIS — A419 Sepsis, unspecified organism: Principal | ICD-10-CM

## 2016-11-30 DIAGNOSIS — I350 Nonrheumatic aortic (valve) stenosis: Secondary | ICD-10-CM

## 2016-11-30 LAB — CBC
HCT: 40 % (ref 36.0–46.0)
HEMOGLOBIN: 12.1 g/dL (ref 12.0–15.0)
MCH: 27.6 pg (ref 26.0–34.0)
MCHC: 30.3 g/dL (ref 30.0–36.0)
MCV: 91.3 fL (ref 78.0–100.0)
Platelets: 118 10*3/uL — ABNORMAL LOW (ref 150–400)
RBC: 4.38 MIL/uL (ref 3.87–5.11)
RDW: 22.4 % — ABNORMAL HIGH (ref 11.5–15.5)
WBC: 28.4 10*3/uL — ABNORMAL HIGH (ref 4.0–10.5)

## 2016-11-30 LAB — GLUCOSE, CAPILLARY
Glucose-Capillary: 106 mg/dL — ABNORMAL HIGH (ref 65–99)
Glucose-Capillary: 112 mg/dL — ABNORMAL HIGH (ref 65–99)
Glucose-Capillary: 114 mg/dL — ABNORMAL HIGH (ref 65–99)
Glucose-Capillary: 121 mg/dL — ABNORMAL HIGH (ref 65–99)
Glucose-Capillary: 137 mg/dL — ABNORMAL HIGH (ref 65–99)
Glucose-Capillary: 142 mg/dL — ABNORMAL HIGH (ref 65–99)

## 2016-11-30 LAB — COMPREHENSIVE METABOLIC PANEL
ALBUMIN: 2.8 g/dL — AB (ref 3.5–5.0)
ALK PHOS: 47 U/L (ref 38–126)
ALT: 26 U/L (ref 14–54)
AST: 56 U/L — ABNORMAL HIGH (ref 15–41)
Anion gap: 13 (ref 5–15)
BUN: 42 mg/dL — AB (ref 6–20)
CALCIUM: 7.3 mg/dL — AB (ref 8.9–10.3)
CO2: 21 mmol/L — AB (ref 22–32)
CREATININE: 3.61 mg/dL — AB (ref 0.44–1.00)
Chloride: 109 mmol/L (ref 101–111)
GFR calc Af Amer: 12 mL/min — ABNORMAL LOW (ref >60)
GFR calc non Af Amer: 10 mL/min — ABNORMAL LOW (ref >60)
GLUCOSE: 115 mg/dL — AB (ref 65–99)
Potassium: 5.1 mmol/L (ref 3.5–5.1)
SODIUM: 143 mmol/L (ref 135–145)
Total Bilirubin: 0.7 mg/dL (ref 0.3–1.2)
Total Protein: 6.4 g/dL — ABNORMAL LOW (ref 6.5–8.1)

## 2016-11-30 LAB — ECHOCARDIOGRAM COMPLETE
HEIGHTINCHES: 62 in
WEIGHTICAEL: 3061.75 [oz_av]

## 2016-11-30 LAB — BASIC METABOLIC PANEL
Anion gap: 15 (ref 5–15)
BUN: 51 mg/dL — AB (ref 6–20)
CO2: 20 mmol/L — ABNORMAL LOW (ref 22–32)
Calcium: 7.2 mg/dL — ABNORMAL LOW (ref 8.9–10.3)
Chloride: 107 mmol/L (ref 101–111)
Creatinine, Ser: 4.14 mg/dL — ABNORMAL HIGH (ref 0.44–1.00)
GFR calc Af Amer: 10 mL/min — ABNORMAL LOW (ref >60)
GFR, EST NON AFRICAN AMERICAN: 9 mL/min — AB (ref >60)
GLUCOSE: 126 mg/dL — AB (ref 65–99)
POTASSIUM: 5.4 mmol/L — AB (ref 3.5–5.1)
Sodium: 142 mmol/L (ref 135–145)

## 2016-11-30 LAB — MAGNESIUM: MAGNESIUM: 1.8 mg/dL (ref 1.7–2.4)

## 2016-11-30 LAB — CULTURE, BLOOD (ROUTINE X 2)

## 2016-11-30 MED ORDER — FENTANYL CITRATE (PF) 100 MCG/2ML IJ SOLN
12.5000 ug | INTRAMUSCULAR | Status: DC | PRN
  Administered 2016-11-30 – 2016-12-01 (×3): 12.5 ug via INTRAVENOUS
  Filled 2016-11-30 (×3): qty 2

## 2016-11-30 MED ORDER — ACETAMINOPHEN 500 MG PO TABS: 1000.0000 mg | ORAL_TABLET | Freq: Four times a day (QID) | ORAL | Status: DC | PRN

## 2016-11-30 MED ORDER — SODIUM CHLORIDE 0.45 % IV SOLN: INTRAVENOUS | Status: AC

## 2016-11-30 NOTE — Progress Notes (Signed)
PCCM Progress Note  Admission date: 11/24/2016 Referring provider: Dr. Carolan Clines Rourke, ER  CC: Altered mental status  HPI: 83 yo female brought to ER with altered mental status.  She was recently dx with Rt leg DVT.  She has IVC filter.  She has hx of rectourethral fistula with intermittent bleeding.    Subjective: Extubated, doin gwell , 2 liters O2  Vital signs: BP 135/64   Pulse 93   Temp 98.1 F (36.7 C) (Oral)   Resp (!) 24   Ht 5\' 2"  (1.575 m)   Wt 86.8 kg (191 lb 5.8 oz)   SpO2 91%   BMI 35.00 kg/m   Intake/output: I/O last 3 completed shifts: In: 3610 [P.O.:680; I.V.:2600; NG/GT:30; IV Piggyback:300] Out: -   General: no distress Neuro: nonfocal , awake, alert HEENT: jvd , no ett PULM: ronchi bilateral, thick clear secretions CV: s 1s2 rrr GI: soft, bs wnl, no r/g Extremities: lower edema mild   CMP Latest Ref Rng & Units 11/30/2016 11/29/2016 12/02/2016  Glucose 65 - 99 mg/dL 478(G115(H) 956(O116(H) 130(Q219(H)  BUN 6 - 20 mg/dL 65(H42(H) 84(O29(H) 96(E24(H)  Creatinine 0.44 - 1.00 mg/dL 9.52(W3.61(H) 4.13(K2.40(H) 4.40(N2.06(H)  Sodium 135 - 145 mmol/L 143 144 136  Potassium 3.5 - 5.1 mmol/L 5.1 5.0 5.3(H)  Chloride 101 - 111 mmol/L 109 116(H) 111  CO2 22 - 32 mmol/L 21(L) 14(L) 12(L)  Calcium 8.9 - 10.3 mg/dL 7.3(L) 7.5(L) 7.7(L)  Total Protein 6.5 - 8.1 g/dL 6.4(L) - 6.5  Total Bilirubin 0.3 - 1.2 mg/dL 0.7 - 0.7  Alkaline Phos 38 - 126 U/L 47 - 65  AST 15 - 41 U/L 56(H) - 68(H)  ALT 14 - 54 U/L 26 - 26     CBC Latest Ref Rng & Units 11/30/2016 11/29/2016 11/21/2016  WBC 4.0 - 10.5 K/uL 28.4(H) 24.6(H) 28.7(H)  Hemoglobin 12.0 - 15.0 g/dL 02.712.1 25.313.5 66.413.2  Hematocrit 36.0 - 46.0 % 40.0 45.8 45.5  Platelets 150 - 400 K/uL 118(L) 135(L) 175     ABG    Component Value Date/Time   PHART 7.228 (L) 11/29/2016 0400   PCO2ART 39.0 11/29/2016 0400   PO2ART 146 (H) 11/29/2016 0400   HCO3 15.8 (L) 11/29/2016 0400   TCO2 17 11/03/2016 1557   ACIDBASEDEF 10.4 (H) 11/29/2016 0400   O2SAT 98.5 11/29/2016  0400     CBG (last 3)   Recent Labs  11/29/16 2337 11/30/16 0415 11/30/16 0836  GLUCAP 142* 112* 137*     Imaging: Ct Head Wo Contrast  Result Date: 11/23/2016 CLINICAL DATA:  Iron deficiency anemia.  ETT. EXAM: CT HEAD WITHOUT CONTRAST TECHNIQUE: Contiguous axial images were obtained from the base of the skull through the vertex without intravenous contrast. COMPARISON:  None. FINDINGS: Brain: No subdural, epidural, or subarachnoid hemorrhages identified. Ventricles and sulci are prominent. An infarct in the left occipital lobe has a chronic appearance. Scattered white matter changes. No acute cortical ischemia infarct is identified. No mass, mass effect, or midline shift. A left cerebellar infarct with encephalomalacia is chronic. The brainstem and basal cisterns are normal. Vascular: Calcified atherosclerosis seen in the intracranial carotid arteries. Skull: Normal. Negative for fracture or focal lesion. Sinuses/Orbits: No acute finding. Other: None. 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 No acute abnormality identified. Electronically Signed   By: Gerome Samavid  Williams III M.D   On: 11/21/2016 15:49   Dg Chest Port 1 View  Result Date: 11/29/2016 CLINICAL DATA:  Check endotracheal tube  position EXAM: PORTABLE CHEST 1 VIEW COMPARISON:  12/23/16 FINDINGS: Endotracheal tube is noted 1 cm above the carina. Nasogastric catheter is noted over the mid chest just above the large hiatal hernia. A Large hiatal hernia is seen. Cardiac shadow is stable. No focal infiltrate or sizable effusion is seen. No bony abnormality is noted. IMPRESSION: Tubes and lines as described. Large hiatal hernia stable from the previous exam. Electronically Signed   By: Alcide Clever M.D.   On: 11/29/2016 07:26   Dg Chest Port 1 View  Result Date: 23-Dec-2016 CLINICAL DATA:  83 year old female status post intubation. EXAM: PORTABLE CHEST 1 VIEW COMPARISON:  Chest x-ray  08/24/2014. FINDINGS: An endotracheal tube is in place with tip 2.0 cm above the carina. Lung volumes are low. No consolidative airspace disease. No pleural effusions. No suspicious appearing pulmonary nodules or masses. No pneumothorax. Large hiatal hernia. Heart size is normal. Aortic atherosclerosis. Upper mediastinal contours are within normal limits. IMPRESSION: 1. Tip of endotracheal tube is 2 cm above the carina and could be withdrawn approximately 2 cm for more optimal placement. 2. Aortic atherosclerosis. 3. Large hiatal hernia. Electronically Signed   By: Trudie Reed M.D.   On: 23-Dec-2016 16:02     Studies: Rt leg u/s 5/21 >> DVT distal femoral to popliteal vein, Rt greater saphenous vein CT head 5/27 >> old Lt occipital and Lt cerebellar infarcts Echo 5/28 >>  Antibiotics: Vancomycin 5/27 >>5/29 Zosyn 5/27 >>  Cultures: Blood 5/27 >> GPR >>  Lines/tubes: ETT 5/27 >> 5/28   Events: 5/27 Admit  Assessment/plan:  Acute respiratory failure with hypoxia in setting of syncope. - IS -wonder if asp took place as cause ETT  Syncope with recent dx of Rt leg DVT s/p filter. - f/u Echo awaited, 3/1 echo I reviewed also - no anticoagulation in setting of vaginal bleeding  Hypotension resolved  - sepsis, hypovolemia, possible PE, but got better with no treatment? - continue IV fluids and pos balance, see renal - continue Abx, see ID  Hx of A fib. 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 - synthroid  PNA asp? Dc vanc Maintain zosyn,  diptehroids likley contamination  dyshpgia barrium , slp Npo ppi  DVT prophylaxis - SCDs SUP - protonix Nutrition - NPO Goals of care - okay with short term extubation.  No  CPR, no defibrillation  Updated pt's son To traid, sdu  Mcarthur Rossetti. Tyson Alias, MD, FACP Pgr: [phone] [facility]

## 2016-11-30 NOTE — Evaluation (Addendum)
Clinical/Bedside Swallow Evaluation Patient Details  Name: Carla [name] MRN: [id number] Date of Birth: 10-29-1921  Today's Date: 11/30/2016 Time: SLP Start Time (ACUTE ONLY): 1100 SLP Stop Time (ACUTE ONLY): 1115 SLP Time Calculation (min) (ACUTE ONLY): 15 min  Past Medical History:  Past Medical History:  Diagnosis Date  . Atrial fibrillation (HCC)   . Cataracts, bilateral   . Chronic kidney disease    chronic kidney disease  . Cystocele   . Diverticulosis   . Dysrhythmia    A- fib  . Encounter for fitting and adjustment of pessary 04/23/2002  . Female bladder prolapse   . Gallstones   . Hemorrhoids   . Hiatal hernia   . High cholesterol   . Hyperkalemia   . Hyperlipidemia   . Hypothyroidism   . Iron deficiency anemia   . Leukocytosis   . Lordosis   . Osteoarthritis   . Osteoarthritis   . Osteoporosis   . Pneumonia    hx aspiration pneumonia  . Rectocele   . Renal insufficiency   . Scoliosis   . Urinary tract infection without hematuria    Past Surgical History:  Past Surgical History:  Procedure Laterality Date  . CATARACT EXTRACTION    . CHOLECYSTECTOMY    . COLONOSCOPY    . ENDOMETRIAL BIOPSY  09/2009  . ESOPHAGOGASTRODUODENOSCOPY    . ESOPHAGOGASTRODUODENOSCOPY N/A 01/09/2013   Procedure: ESOPHAGOGASTRODUODENOSCOPY (EGD);  Surgeon: Vertell NovakJames L Edwards Jr., MD;  [facility];  Service: Endoscopy;  Laterality: N/A;  . INTRAMEDULLARY (IM) NAIL INTERTROCHANTERIC Right 12/13/2015   Procedure: INTRAMEDULLARY (IM) NAIL INTERTROCHANTRIC;  Surgeon: Samson FredericBrian Swinteck, MD;  Location: [facility];  Service: Orthopedics;  Laterality: Right;  . RECTAL ULTRASOUND N/A 04/10/2014   Procedure: RECTAL ULTRASOUND;  Surgeon: Romie LeveeAlicia Thomas, MD;  Location: [facility];  Service: Endoscopy;  Laterality: N/A;   HPI:  83 y.o.femaleadmittedon 02-07-2018from home after becoming unresponsive.  Dx acute resp failure with hypoxia in setting of syncope (ETT 5/27-5/28), hypotension,  recently dx with Rt leg DVT. She has IVC filter; hx of rectourethral fistula.  Large hiatal hernia per chest CT.    Assessment / Plan / Recommendation Clinical Impression  Pt presents with s/s of an esophageal dysphagia, which she describes as being present for several weeks.  There are no overt s/s of an oropharyngeal dysphagia, but pt coughs/regurgitates a portion of all PO boluses (thin liquids, purees) approximately 20-30 seconds after consumption.  Oral-motor function, respiratory/swallowing reciprocity, and phonation all WFL.  Recommend esophageal w/u.  Son, pt agree. D/W MD.  SLP Visit Diagnosis: Dysphagia, unspecified (R13.10)    Aspiration Risk  Mild aspiration risk    Diet Recommendation   continue clear liquids pending esophagram  Medication Administration: Crushed with puree    Other  Recommendations Recommended Consults: Consider esophageal assessment Oral Care Recommendations: Oral care BID   Follow up Recommendations  (tba)      Frequency and Duration min 2x/week  1 week       Prognosis        Swallow Study   General Date of Onset: March 15, 2017 HPI: 83 y.o.femaleadmittedon 02-07-2018from home after becoming unresponsive.  Dx acute resp failure with hypoxia in setting of syncope (ETT 5/27-5/28), hypotension, recently dx with Rt leg DVT. She has IVC filter; hx of rectourethral fistula.  Large hiatal hernia per chest CT.  Type of Study: Bedside Swallow Evaluation Previous Swallow Assessment: no Diet Prior to this Study: Other (Comment) (clear liquids) Temperature Spikes Noted: No Respiratory Status: Nasal  cannula History of Recent Intubation: Yes Length of Intubations (days): 1 days Date extubated: 11/29/16 Behavior/Cognition: Alert;Cooperative Oral Cavity Assessment: Other (comment) (bruising on tongue due to intubation) Oral Care Completed by SLP: No Oral Cavity - Dentition: Adequate natural dentition Vision: Functional for self-feeding Self-Feeding  Abilities: Able to feed self Patient Positioning: Upright in bed Baseline Vocal Quality: Normal Volitional Cough: Strong Volitional Swallow: Able to elicit    Oral/Motor/Sensory Function Overall Oral Motor/Sensory Function: Within functional limits   Ice Chips Ice chips: Within functional limits   Thin Liquid Thin Liquid: Impaired Presentation: Self Fed;Straw Pharyngeal  Phase Impairments: Other (comments) (regurgitation)    Nectar Thick Nectar Thick Liquid: Not tested   Honey Thick Honey Thick Liquid: Not tested   Puree Puree: Impaired Presentation: Self Fed;Spoon Oral Phase Functional Implications: Other (comment) (regurgitation)   Solid   GO   Solid: Not tested        Carla [name] Carla [name] 11/30/2016,11:19 AM

## 2016-11-30 NOTE — Progress Notes (Signed)
Stopped by to visit again w/ daughter Stanton Kidney, a nurse at [employer]. Since we met in the ED Sunday, her sister and brother from out of town have been here. Latter just left, former is still here. Stanton Kidney said pt is having trouble swallowing now -- a new problem. Provided spiritual/emotional support. Lab was coming in to draw blood, Stanton Kidney thanked me for coming, and I let her know chaplain is available for f/u.   11/30/16 2000  Clinical Encounter Type  Visited With Patient and family together  Visit Type Follow-up;Psychological support;Spiritual support;Social support;Critical Care  Referral From [place] Lorenna Lurry, [place]

## 2016-11-30 NOTE — Progress Notes (Signed)
11/30/2016 5:57 PM  IV access obtained. Pt. Denies nausea at this time. Ok to transfer once nausea resolved and IV access obtained per Dr. Jamison NeighborNestor. Pt. tx to 3w15 with RN. Linnie Delgrande, Blanchard KelchStephanie Ingold

## 2016-11-30 NOTE — Progress Notes (Signed)
  Echocardiogram 2D Echocardiogram has been performed.  Tye Juarez T Kingsten Enfield 11/30/2016, 10:58 AM

## 2016-11-30 NOTE — Consult Note (Signed)
Referring Provider: Dr. Craige CottaSood Primary Care Physician:  Waldon MerlMartin, William C, PA-C Primary Gastroenterologist:  Dr. Randa EvensEdwards  Reason for Consultation: Vomiting/Hiatal Hernia  HPI: Carla [name] is a 83 y.o. female with multiple medical problems and known history of a large hiatal hernia with an EGD in 2014 that showed 2/3 of her stomach to be intrathoracic with ulcerative esophagitis and proximal gastritis. She reports that she was eating solid food without difficulty until 2 weeks ago when she began having regurgitation of any bite of solid food that she would take. She does not think she was able to keep down any solid food for the past 2 weeks. Able to swallow liquids without difficulty. Denies weight loss. Admitted for altered mental status and had acute respiratory failure with hypoxia. History of PE and DVT and s/p IVC filter. Taken off Eliquis in March due to a GI bleed. Speech path evaluation today showed no oropharyngeal dysphagia but concern for esophageal dysphagia.  Past Medical History:  Diagnosis Date  . Atrial fibrillation (HCC)   . Cataracts, bilateral   . Chronic kidney disease    chronic kidney disease  . Cystocele   . Diverticulosis   . Dysrhythmia    A- fib  . Encounter for fitting and adjustment of pessary 04/23/2002  . Female bladder prolapse   . Gallstones   . Hemorrhoids   . Hiatal hernia   . High cholesterol   . Hyperkalemia   . Hyperlipidemia   . Hypothyroidism   . Iron deficiency anemia   . Leukocytosis   . Lordosis   . Osteoarthritis   . Osteoarthritis   . Osteoporosis   . Pneumonia    hx aspiration pneumonia  . Rectocele   . Renal insufficiency   . Scoliosis   . Urinary tract infection without hematuria     Past Surgical History:  Procedure Laterality Date  . CATARACT EXTRACTION    . CHOLECYSTECTOMY    . COLONOSCOPY    . ENDOMETRIAL BIOPSY  09/2009  . ESOPHAGOGASTRODUODENOSCOPY    . ESOPHAGOGASTRODUODENOSCOPY N/A 01/09/2013   Procedure:  ESOPHAGOGASTRODUODENOSCOPY (EGD);  Surgeon: Vertell NovakJames L Edwards Jr., MD;  [facility];  Service: Endoscopy;  Laterality: N/A;  . INTRAMEDULLARY (IM) NAIL INTERTROCHANTERIC Right 12/13/2015   Procedure: INTRAMEDULLARY (IM) NAIL INTERTROCHANTRIC;  Surgeon: Samson FredericBrian Swinteck, MD;  Location: [facility];  Service: Orthopedics;  Laterality: Right;  . RECTAL ULTRASOUND N/A 04/10/2014   Procedure: RECTAL ULTRASOUND;  Surgeon: Romie LeveeAlicia Thomas, MD;  Location: [facility];  Service: Endoscopy;  Laterality: N/A;    Prior to Admission medications   Medication Sig Start Date End Date Taking? Authorizing Provider  acetaminophen (TYLENOL) 650 MG CR tablet Take 650 mg by mouth every 8 (eight) hours as needed for pain.   Yes [provider]  amiodarone (PACERONE) 100 MG tablet Take 1 tablet (100 mg total) by mouth daily. 05/12/16  Yes [place], Peter M, MD  Calcium Carb-Cholecalciferol (CALCIUM-VITAMIN D) 600-400 MG-UNIT TABS Take 1 tablet by mouth 2 (two) times daily.    Yes [provider]  Ferrous Sulfate (IRON) 325 (65 Fe) MG TABS Take 1 Dose by mouth daily. Patient taking differently: Take 325 mg by mouth daily.  10/20/16  Yes Waldon MerlMartin, William C, PA-C  levothyroxine (SYNTHROID, LEVOTHROID) 75 MCG tablet Take 75 mcg by mouth daily before breakfast.   Yes [provider]  nitrofurantoin (MACRODANTIN) 50 MG capsule Take 50 mg by mouth daily with breakfast.  12/27/14  Yes [provider]  pantoprazole (PROTONIX)  40 MG tablet Take 40 mg by mouth daily with breakfast.  01/10/13  Yes Dorothea Ogle, MD  phenylephrine-shark liver oil-mineral oil-petrolatum (PREPARATION H) 0.25-3-14-71.9 % rectal ointment Place 1 application rectally 2 (two) times daily as needed for hemorrhoids.   Yes [provider]  polyethylene glycol (MIRALAX / GLYCOLAX) packet Take 8.5 g by mouth daily as needed for mild constipation or moderate constipation.    Yes [provider]  witch hazel-glycerin  (TUCKS) pad Apply 1 application topically as needed for hemorrhoids.   Yes [provider]    Scheduled Meds: . amiodarone  100 mg Oral Daily  . insulin aspart  2-6 Units Subcutaneous Q4H  . levothyroxine  75 mcg Oral QAC breakfast  . pantoprazole (PROTONIX) IV  40 mg Intravenous QHS   Continuous Infusions: . sodium chloride 50 mL/hr at 11/30/16 1115  . piperacillin-tazobactam (ZOSYN)  IV Stopped (11/30/16 0944)  .  sodium bicarbonate  infusion 1000 mL 75 mL/hr at 11/30/16 0842   PRN Meds:.acetaminophen, albuterol, fentaNYL (SUBLIMAZE) injection, ondansetron (ZOFRAN) IV, Zinc Oxide  Allergies as of 11/24/2016 - Review Complete 11/18/2016  Allergen Reaction Noted  . Bactrim [sulfamethoxazole-trimethoprim] Other (See Comments) 09/04/2014  . Contrast media [iodinated diagnostic agents] Other (See Comments) 12/12/2015  . Nsaids Other (See Comments) 09/04/2014    Family History  Problem Relation Age of Onset  . Diabetes Father   . Leukemia Father   . Breast cancer Paternal Aunt   . Breast cancer Paternal Aunt   . Cancer Unknown        several family members on dad's side of family  . Heart failure Brother        Pt cannot confirm this history    Social History   Social History  . Marital status: Widowed    Spouse name: N/A  . Number of children: 4  . Years of education: N/A   Occupational History  . Retired Chartered loss adjuster    Social History Main Topics  . Smoking status: Never Smoker  . Smokeless tobacco: Never Used  . Alcohol use No  . Drug use: No  . Sexual activity: No   Other Topics Concern  . Not on file   Social History Narrative   Widowed.  Lives alone.  Uses a cane/walker to ambulate but independent of ADLs.    Review of Systems: All negative except as stated above in HPI.  Physical Exam: Vital signs: Vitals:   11/30/16 1000 11/30/16 1100  BP: 135/64 123/62  Pulse: 93 93  Resp: (!) 24 (!) 30  Temp:    T 98.1  Last BM Date:  11/29/16 General:   Elderly, lethargic, Well-developed, well-nourished, pleasant and cooperative in NAD HEENT: anicteric sclera, oropharynx clear Neck: supple, nontender Lungs:  Clear throughout to auscultation.   No wheezes, crackles, or rhonchi. No acute distress. Heart:  Regular rate and rhythm; no murmurs, clicks, rubs,  or gallops. Abdomen: soft, nontender, nondistended, +BS  Rectal:  Deferred Ext: no edema  GI:  Lab Results:  Recent Labs  11/24/2016 1859 11/29/16 0421 11/30/16 0208  WBC 28.7* 24.6* 28.4*  HGB 13.2 13.5 12.1  HCT 45.5 45.8 40.0  PLT 175 135* 118*   BMET  Recent Labs  11/27/2016 1859 11/29/16 0421 11/30/16 0208  NA 136 144 143  K 5.3* 5.0 5.1  CL 111 116* 109  CO2 12* 14* 21*  GLUCOSE 219* 116* 115*  BUN 24* 29* 42*  CREATININE 2.06* 2.40* 3.61*  CALCIUM 7.7* 7.5*  7.3*   LFT  Recent Labs  11/30/16 0208  PROT 6.4*  ALBUMIN 2.8*  AST 56*  ALT 26  ALKPHOS 47  BILITOT 0.7   PT/INR  Recent Labs  Dec 11, 2016 1530 12-11-2016 2216  LABPROT 15.3* 16.3*  INR 1.20 1.31     Studies/Results: Ct Head Wo Contrast  Result Date: December 11, 2016 CLINICAL DATA:  Iron deficiency anemia.  ETT. EXAM: CT HEAD WITHOUT CONTRAST TECHNIQUE: Contiguous axial images were obtained from the base of the skull through the vertex without intravenous contrast. COMPARISON:  None. FINDINGS: Brain: No subdural, epidural, or subarachnoid hemorrhages identified. Ventricles and sulci are prominent. An infarct in the left occipital lobe has a chronic appearance. Scattered white matter changes. No acute cortical ischemia infarct is identified. No mass, mass effect, or midline shift. A left cerebellar infarct with encephalomalacia is chronic. The brainstem and basal cisterns are normal. Vascular: Calcified atherosclerosis seen in the intracranial carotid arteries. Skull: Normal. Negative for fracture or focal lesion. Sinuses/Orbits: No acute finding. Other: None. IMPRESSION: Left  occipital and left cerebellar hemisphere infarcts over chronic appearance. No acute abnormality identified. Electronically Signed   By: Gerome Sam III M.D   On: 11-Dec-2016 15:49   Dg Chest Port 1 View  Result Date: 11/29/2016 CLINICAL DATA:  Check endotracheal tube position EXAM: PORTABLE CHEST 1 VIEW COMPARISON:  12-11-16 FINDINGS: Endotracheal tube is noted 1 cm above the carina. Nasogastric catheter is noted over the mid chest just above the large hiatal hernia. A Large hiatal hernia is seen. Cardiac shadow is stable. No focal infiltrate or sizable effusion is seen. No bony abnormality is noted. IMPRESSION: Tubes and lines as described. Large hiatal hernia stable from the previous exam. Electronically Signed   By: Alcide Clever M.D.   On: 11/29/2016 07:26   Dg Chest Port 1 View  Result Date: 11-Dec-2016 CLINICAL DATA:  83 year old female status post intubation. EXAM: PORTABLE CHEST 1 VIEW COMPARISON:  Chest x-ray 08/24/2014. FINDINGS: An endotracheal tube is in place with tip 2.0 cm above the carina. Lung volumes are low. No consolidative airspace disease. No pleural effusions. No suspicious appearing pulmonary nodules or masses. No pneumothorax. Large hiatal hernia. Heart size is normal. Aortic atherosclerosis. Upper mediastinal contours are within normal limits. IMPRESSION: 1. Tip of endotracheal tube is 2 cm above the carina and could be withdrawn approximately 2 cm for more optimal placement. 2. Aortic atherosclerosis. 3. Large hiatal hernia. Electronically Signed   By: Trudie Reed M.D.   On: 12-11-16 16:02    Impression/Plan: 83 yo woman with a known history of a large hiatal hernia who has been having recurrent vomiting of solid food for the past 2 weeks. DDx volvulus of hiatal hernia or partial obstruction of hiatal hernia vs worsening of hiatal hernia with gastric outlet obstruction although less likely will tolerance of liquids. Agree with barium swallow as the next step per  speech path recs. Hold off on an updated EGD for now and await barium swallow results. Would not recommend surgery for her hiatal hernia unless a volvulus is present and hence high risk for ischemia. Will f/u on barium swallow results.    LOS: 2 days   Braeton Wolgamott C.  11/30/2016, 12:12 PM  Pager [phone]  AFTER 5 pm or on weekends please call [phone]

## 2016-11-30 NOTE — Progress Notes (Signed)
eLink Physician-Brief Progress Note Patient Name: Carla [name] DOB: 1921-08-06 MRN: [id number]   Date of Service  11/30/2016  HPI/Events of Note  Notified by radiologist regarding interpretation from patient's barium esophagram today. Appears to have approximately 50% of her stomach with in her chest creating a possible volvulus. Notified by bedside nurse the patient is having nausea and vomiting at this time with loss of IV access in transit from radiology.   eICU Interventions  1. Nurse to contact IV team for placement of IV access to allow for administration of antiemetic 2. Ordering CT abdomen and pelvis without IV or oral contrast given her worsening acute renal failure and oral contrast administered this afternoon  3. Patient to remain on clear liquid diet as tolerated      Intervention Category Major Interventions: Other:  Lawanda CousinsJennings Kemari Narez 11/30/2016, 4:32 PM

## 2016-12-01 DIAGNOSIS — K449 Diaphragmatic hernia without obstruction or gangrene: Secondary | ICD-10-CM

## 2016-12-01 DIAGNOSIS — R55 Syncope and collapse: Secondary | ICD-10-CM

## 2016-12-01 DIAGNOSIS — R1310 Dysphagia, unspecified: Secondary | ICD-10-CM

## 2016-12-01 DIAGNOSIS — N170 Acute kidney failure with tubular necrosis: Secondary | ICD-10-CM

## 2016-12-01 LAB — BASIC METABOLIC PANEL
ANION GAP: 13 (ref 5–15)
BUN: 56 mg/dL — AB (ref 6–20)
CHLORIDE: 106 mmol/L (ref 101–111)
CO2: 25 mmol/L (ref 22–32)
Calcium: 7.2 mg/dL — ABNORMAL LOW (ref 8.9–10.3)
Creatinine, Ser: 4.31 mg/dL — ABNORMAL HIGH (ref 0.44–1.00)
GFR calc non Af Amer: 8 mL/min — ABNORMAL LOW (ref >60)
GFR, EST AFRICAN AMERICAN: 9 mL/min — AB (ref >60)
Glucose, Bld: 111 mg/dL — ABNORMAL HIGH (ref 65–99)
POTASSIUM: 4.9 mmol/L (ref 3.5–5.1)
SODIUM: 144 mmol/L (ref 135–145)

## 2016-12-01 LAB — CBC
HCT: 34.8 % — ABNORMAL LOW (ref 36.0–46.0)
HEMOGLOBIN: 10.5 g/dL — AB (ref 12.0–15.0)
MCH: 27.1 pg (ref 26.0–34.0)
MCHC: 30.2 g/dL (ref 30.0–36.0)
MCV: 89.7 fL (ref 78.0–100.0)
Platelets: 107 10*3/uL — ABNORMAL LOW (ref 150–400)
RBC: 3.88 MIL/uL (ref 3.87–5.11)
RDW: 22.6 % — ABNORMAL HIGH (ref 11.5–15.5)
WBC: 25.6 10*3/uL — ABNORMAL HIGH (ref 4.0–10.5)

## 2016-12-01 LAB — GLUCOSE, CAPILLARY
GLUCOSE-CAPILLARY: 102 mg/dL — AB (ref 65–99)
Glucose-Capillary: 125 mg/dL — ABNORMAL HIGH (ref 65–99)

## 2016-12-01 MED ORDER — SODIUM BICARBONATE 8.4 % IV SOLN: INTRAVENOUS | Status: DC

## 2016-12-01 MED ORDER — SODIUM BICARBONATE 8.4 % IV SOLN
INTRAVENOUS | Status: DC
  Filled 2016-12-01: qty 50

## 2016-12-01 MED ORDER — ALBUTEROL SULFATE (2.5 MG/3ML) 0.083% IN NEBU
2.5000 mg | INHALATION_SOLUTION | RESPIRATORY_TRACT | Status: DC | PRN
  Administered 2016-12-01: 2.5 mg via RESPIRATORY_TRACT
  Filled 2016-12-01: qty 3

## 2016-12-01 MED ORDER — ACETAMINOPHEN 325 MG PO TABS: 650.0000 mg | ORAL_TABLET | Freq: Four times a day (QID) | ORAL | Status: DC | PRN

## 2016-12-01 MED ORDER — FENTANYL CITRATE (PF) 100 MCG/2ML IJ SOLN: 25.0000 ug | INTRAMUSCULAR | Status: DC | PRN

## 2016-12-01 MED ORDER — DEXTROSE 5 % IV SOLN
INTRAVENOUS | Status: DC
  Administered 2016-12-01: 14:00:00 via INTRAVENOUS
  Filled 2016-12-01 (×2): qty 100

## 2016-12-01 MED ORDER — ACETAMINOPHEN 650 MG RE SUPP: 650.0000 mg | Freq: Four times a day (QID) | RECTAL | Status: DC | PRN

## 2016-12-01 NOTE — Progress Notes (Signed)
After pt drank cranberry juice pt had one episode of emesis and started coughing.  02 sats dropped to the mid 80's on 4L and on 6L nasal cannula. Dr. Sharon SellerMcclung made aware. New order received for a breathing tx. Respiratory therapist made aware and came up to see pt. Pt placed on high flow nasal cannula. 02 sats staying in the high 80's to low 90's. Dr.Mcclung aware. Daughter at bedside. Will cont to monitor pt.

## 2016-12-01 NOTE — Progress Notes (Signed)
Placed patient on 13LHFNC due to SATS of 84% on 6LNC, SATS improved to 90%, BBS rhonchi and wheezing.

## 2016-12-01 NOTE — Progress Notes (Signed)
Pt's 02 sats dropped down in to the mid 80's on 13L HFNC, while resting in bed. Respiratory therapist came to see pt. Placed on Non rebreather. 02 sats in the low 90's. Md on call paged. Daughter at bedside. Oncoming nurse aware who will cont to monitor pt.

## 2016-12-01 NOTE — Progress Notes (Signed)
[facility] Progress Note  Carla Fellerarline S [name] 83 y.o. Jun 21, 1922   Subjective: Somnolent and daughter reports patient recently received Fentanyl; Responds to commands; Daughter at bedside.  Objective: Vital signs in last 24 hours: Vitals:   12/01/16 0409 12/01/16 0730  BP: (!) 117/53 (!) 112/51  Pulse: 89 79  Resp: (!) 22 18  Temp:  98 F (36.7 C)    Physical Exam: Gen: somnolent, elderly, no acute distress HEENT: anicteric sclera CV: RRR Chest: CTA B Abd: epigastric tenderness with guarding, soft, nondistended, +BS  Lab Results:  Recent Labs  11/29/16 0421 11/30/16 0208 11/30/16 2020 12/01/16 0418  NA 144 143 142 144  K 5.0 5.1 5.4* 4.9  CL 116* 109 107 106  CO2 14* 21* 20* 25  GLUCOSE 116* 115* 126* 111*  BUN 29* 42* 51* 56*  CREATININE 2.40* 3.61* 4.14* 4.31*  CALCIUM 7.5* 7.3* 7.2* 7.2*  MG 1.9 1.8  --   --   PHOS 4.9*  --   --   --     Recent Labs  11/06/2016 1859 11/30/16 0208  AST 68* 56*  ALT 26 26  ALKPHOS 65 47  BILITOT 0.7 0.7  PROT 6.5 6.4*  ALBUMIN 3.1* 2.8*    Recent Labs  11/02/2016 1530  11/30/16 0208 12/01/16 0418  WBC 19.6*  < > 28.4* 25.6*  NEUTROABS 13.1*  --   --   --   HGB 12.4  < > 12.1 10.5*  HCT 42.6  < > 40.0 34.8*  MCV 92.2  < > 91.3 89.7  PLT 169  < > 118* 107*  < > = values in this interval not displayed.  Recent Labs  11/21/2016 1530 11/09/2016 2216  LABPROT 15.3* 16.3*  INR 1.20 1.31      Assessment/Plan: 83 yo with a large hiatal hernia causing a functional obstruction. Barium swallow reviewed showing half of the stomach to be intrathoracic with a functional obstruction from the intrathoracic stomach to the intraabdominal stomach. Also, no contrast entered the duodenum suggesting a gastric outlet obstruction. Agree with the updated abd CT scan. NPO. Long-term feeding options are limited and likely would need to remain on a liquid diet indefinitely. May be able to tolerate a pureed diet if the  functional obstruction cleares. A PEG tube would not be recommended or feasible due to the hiatal hernia. Would manage her hiatal hernia conservatively and not recommend surgery unless evidence of torsion or volvulus on noncontrasted CT scan and then would need to consult surgery. Her renal insufficiency has worsened and is being managed by the primary team. D/W daughter.   Lezlee Gills C. 12/01/2016, 11:51 AM   Pager [phone]  AFTER 5 pm or on weekends call [phone] ID: Carla FellerEarline S [name], female   DOB: Jun 21, 1922, 83 y.o.   MRN: [id number]

## 2016-12-01 NOTE — Progress Notes (Signed)
[facility] TEAM 1 - Stepdown/ICU TEAM  Alfonzo Fellerarline S Muckle  XBJ:[id number] DOB: 1921-08-13 DOA: Nov 20, 2016 PCP: Waldon MerlMartin, William C, PA-C    Brief Narrative:  83 yo female brought to ER with altered mental status.  She was walking toward her daughter and became suddenly cold, clammy, and weak. She sat down to rest, but eventually she had a recurrence of her sx and then became unresponsive. EMS was summoned, and intubated her for GCS 3.  She was dx with Rt leg DVT Feb 2017 for which she has an IVC filter.  She has hx of recto-urethral fistula with intermittent bleeding.   Significant Events: 5/27 admit - intubated on arrival 5/28 extubated 5/30 [facility] assumed care  Subjective: At the time of my visit the patient is regurgitating clear liquids that she has attempted TEE for breakfast.  She denies nausea or chest pain shortness of breath fevers chills or abdominal pain.  Assessment & Plan:  Acute respiratory failure with hypoxia in setting of AMS/syncope  Likely related to aspiration in setting of AMS with very large hiatal hernia - d/c abx after 5 days   Syncope  TTE noted EF 60-65% w/ no WMA and grade 1 DD - LA appears compressed by ?stomach - perhaps she suffered transient decreased CO due to LA compression - follow clinically for now   Hypotension  Resolved - etiology unclear - possible diminished CO due to extrinsic compression of LA by large HH?  Very large Hiatal Hernia - Esophageal dysphagia  GI following - CT abdom pending to better evaluate - this is a known chronic problem for her  Rt leg DVT w/ PE s/p filter Taken off Eliquis in March due to GIB  Acute renal failure - ATN No hydronephrosis on US - crt steadily climbing - ?ischemic insult/hypoperfusion at time of presentation - keep euvolemic - avoid nephrotoxic meds/contrast - suspect she is not a long term HD candidate but will discuss w/ family if crt continues to climb - UOP is falling   Recent Labs Lab Sep 25, 2016 1859  11/29/16 0421 11/30/16 0208 11/30/16 2020 12/01/16 0418  CREATININE 2.06* 2.40* 3.61* 4.14* 4.31*    Chronic Afib Cont amiodarone - CHA2DS2 - VASc is 4 - not on anticoagulation due to recurrent GI bleeding  Recto-vaginal vesico-vaginal fistula  not a surgical candidate - [facility]  Hypothyroidism Cont synthroid  DVT prophylaxis: SCDs Code Status: DNR - NO CODE  Family Communication: no family present at time of exam - spoke with daughter via phone Disposition Plan: SDU  Consultants:  [facility]  Antimicrobials:  Vancomycin 5/27 > 5/29 Zosyn 5/27 >  Objective: Blood pressure (!) 112/51, pulse 79, temperature 98 F (36.7 C), temperature source Axillary, resp. rate 18, height 5\' 2"  (1.575 m), weight 91.7 kg (202 lb 3.2 oz), SpO2 97 %.  Intake/Output Summary (Last 24 hours) at 12/01/16 0758 Last data filed at 12/01/16 0000  Gross per 24 hour  Intake             1765 ml  Output                0 ml  Net             1765 ml   Filed Weights   11/29/16 0500 11/30/16 0530 12/01/16 0500  Weight: 86.4 kg (190 lb 7.6 oz) 86.8 kg (191 lb 5.8 oz) 91.7 kg (202 lb 3.2 oz)    Examination: General: No acute respiratory distress Lungs: Clear to  auscultation bilaterally without wheezes or crackles Cardiovascular: Regular rate and rhythm without murmur  Abdomen: Nontender, nondistended, soft, bowel sounds positive, no rebound, no ascites, no appreciable mass Extremities: No significant cyanosis, clubbing, or edema bilateral lower extremities  CBC:  Recent Labs Lab 11/16/2016 1530 11/10/2016 1533 11/15/2016 1859 11/29/16 0421 11/30/16 0208 12/01/16 0418  WBC 19.6*  --  28.7* 24.6* 28.4* 25.6*  NEUTROABS 13.1*  --   --   --   --   --   HGB 12.4 15.0 13.2 13.5 12.1 10.5*  HCT 42.6 44.0 45.5 45.8 40.0 34.8*  MCV 92.2  --  94.4 95.0 91.3 89.7  PLT 169  --  175 135* 118* 107*   Basic Metabolic Panel:  Recent Labs Lab 11/30/2016 1859 11/29/16 0421 11/30/16 0208  11/30/16 2020 12/01/16 0418  NA 136 144 143 142 144  K 5.3* 5.0 5.1 5.4* 4.9  CL 111 116* 109 107 106  CO2 12* 14* 21* 20* 25  GLUCOSE 219* 116* 115* 126* 111*  BUN 24* 29* 42* 51* 56*  CREATININE 2.06* 2.40* 3.61* 4.14* 4.31*  CALCIUM 7.7* 7.5* 7.3* 7.2* 7.2*  MG  --  1.9 1.8  --   --   PHOS  --  4.9*  --   --   --    GFR: Estimated Creatinine Clearance: 8.8 mL/min (A) (by C-G formula based on SCr of 4.31 mg/dL (H)).  Liver Function Tests:  Recent Labs Lab 11/29/2016 1530 11/29/2016 1859 11/30/16 0208  AST 46* 68* 56*  ALT 13* 26 26  ALKPHOS 63 65 47  BILITOT 0.4 0.7 0.7  PROT 6.5 6.5 6.4*  ALBUMIN 3.1* 3.1* 2.8*    Coagulation Profile:  Recent Labs Lab 11/27/2016 1530 11/13/2016 2216  INR 1.20 1.31    Cardiac Enzymes:  Recent Labs Lab 12/01/2016 1859  TROPONINI 0.03*    HbA1C: Hgb A1c MFr Bld  Date/Time Value Ref Range Status  01/08/2013 01:21 PM 6.1 (H) <5.7 % Final    Comment:    (NOTE)                                                                       According to the ADA Clinical Practice Recommendations for 2011, when HbA1c is used as a screening test:  >=6.5%   Diagnostic of Diabetes Mellitus           (if abnormal result is confirmed) 5.7-6.4%   Increased risk of developing Diabetes Mellitus References:Diagnosis and Classification of Diabetes Mellitus,Diabetes Care,2011,34(Suppl 1):S62-S69 and Standards of Medical Care in         Diabetes - 2011,Diabetes Care,2011,34 (Suppl 1):S11-S61.    CBG:  Recent Labs Lab 11/30/16 1221 11/30/16 1630 11/30/16 2035 12/01/16 0025 12/01/16 0729  GLUCAP 106* 121* 114* 125* 102*    Recent Results (from the past 240 hour(s))  Culture, blood (routine x 2)     Status: Abnormal   Collection Time: 11/27/2016  4:30 PM  Result Value Ref Range Status   Specimen Description BLOOD SITE NOT SPECIFIED  Final   Special Requests   Final    BOTTLES DRAWN AEROBIC AND ANAEROBIC Blood Culture results may not be optimal  due to an inadequate volume of blood received in culture  bottles   Culture  Setup Time   Final    GRAM POSITIVE RODS ANAEROBIC BOTTLE ONLY CRITICAL RESULT CALLED TO, READ BACK BY AND VERIFIED WITHJudye Bos PHARMD, AT [phone] 11/29/16    Culture CLOSTRIDIUM PERFRINGENS (A)  Final   Report Status 11/30/2016 FINAL  Final  Blood Culture ID Panel (Reflexed)     Status: None   Collection Time: 12/18/2016  4:30 PM  Result Value Ref Range Status   Enterococcus species NOT DETECTED NOT DETECTED Final   Listeria monocytogenes NOT DETECTED NOT DETECTED Final   Staphylococcus species NOT DETECTED NOT DETECTED Final   Staphylococcus aureus NOT DETECTED NOT DETECTED Final   Streptococcus species NOT DETECTED NOT DETECTED Final   Streptococcus agalactiae NOT DETECTED NOT DETECTED Final   Streptococcus pneumoniae NOT DETECTED NOT DETECTED Final   Streptococcus pyogenes NOT DETECTED NOT DETECTED Final   Acinetobacter baumannii NOT DETECTED NOT DETECTED Final   Enterobacteriaceae species NOT DETECTED NOT DETECTED Final   Enterobacter cloacae complex NOT DETECTED NOT DETECTED Final   Escherichia coli NOT DETECTED NOT DETECTED Final   Klebsiella oxytoca NOT DETECTED NOT DETECTED Final   Klebsiella pneumoniae NOT DETECTED NOT DETECTED Final   Proteus species NOT DETECTED NOT DETECTED Final   Serratia marcescens NOT DETECTED NOT DETECTED Final   Haemophilus influenzae NOT DETECTED NOT DETECTED Final   Neisseria meningitidis NOT DETECTED NOT DETECTED Final   Pseudomonas aeruginosa NOT DETECTED NOT DETECTED Final   Candida albicans NOT DETECTED NOT DETECTED Final   Candida glabrata NOT DETECTED NOT DETECTED Final   Candida krusei NOT DETECTED NOT DETECTED Final   Candida parapsilosis NOT DETECTED NOT DETECTED Final   Candida tropicalis NOT DETECTED NOT DETECTED Final  MRSA PCR Screening     Status: None   Collection Time: 12/18/2016  6:52 PM  Result Value Ref Range Status   MRSA by PCR NEGATIVE NEGATIVE  Final    Comment:        The GeneXpert MRSA Assay (FDA approved for NASAL specimens only), is one component of a comprehensive MRSA colonization surveillance program. It is not intended to diagnose MRSA infection nor to guide or monitor treatment for MRSA infections.   Culture, blood (routine x 2)     Status: None (Preliminary result)   Collection Time: 12/18/2016  7:16 PM  Result Value Ref Range Status   Specimen Description BLOOD RIGHT HAND  Final   Special Requests IN PEDIATRIC BOTTLE Blood Culture adequate volume  Final   Culture NO GROWTH 2 DAYS  Final   Report Status PENDING  Incomplete     Scheduled Meds: . amiodarone  100 mg Oral Daily  . insulin aspart  2-6 Units Subcutaneous Q4H  . levothyroxine  75 mcg Oral QAC breakfast  . pantoprazole (PROTONIX) IV  40 mg Intravenous QHS     LOS: 3 days   Lonia Blood, MD [facility] Office  [phone] Pager - Text Page per Loretha Stapler as per below:  On-Call/Text Page:      [email or web address]      password [email or web address]  If 7PM-7AM, please contact night-coverage [email or web address] Password [email or web address] 12/01/2016, 7:58 AM

## 2016-12-01 NOTE — Progress Notes (Signed)
Reported from night shift nurse pt did not void last night. Pt denies any pain or pressure. A bladder scan obtained showing 50 cc of urine. Will cont to monitor pt.

## 2016-12-01 NOTE — Progress Notes (Signed)
SLP Cancellation Note  Patient Details Name: Carla [name] MRN: [id number] DOB: [birth year]   Cancellation/Discharge treatment:    Results of barium swallow reviewed; GI following - SLP services will sign off.       Blenda MountsCouture, Lindey Renzulli [name] 12/01/2016, 9:47 AM

## 2016-12-01 NOTE — Progress Notes (Signed)
Pt's daughter Carla [name] stated she would like to speak with MD. Dr. Sharon SellerMcClung paged twice. Will cont to monitor pt.

## 2016-12-02 DIAGNOSIS — R1310 Dysphagia, unspecified: Secondary | ICD-10-CM

## 2016-12-02 DIAGNOSIS — Z7189 Other specified counseling: Secondary | ICD-10-CM

## 2016-12-02 DIAGNOSIS — Z515 Encounter for palliative care: Secondary | ICD-10-CM

## 2016-12-02 LAB — RENAL FUNCTION PANEL
Albumin: 2.1 g/dL — ABNORMAL LOW (ref 3.5–5.0)
Anion gap: 13 (ref 5–15)
BUN: 64 mg/dL — AB (ref 6–20)
CHLORIDE: 103 mmol/L (ref 101–111)
CO2: 27 mmol/L (ref 22–32)
Calcium: 7.2 mg/dL — ABNORMAL LOW (ref 8.9–10.3)
Creatinine, Ser: 4.82 mg/dL — ABNORMAL HIGH (ref 0.44–1.00)
GFR calc Af Amer: 8 mL/min — ABNORMAL LOW (ref >60)
GFR calc non Af Amer: 7 mL/min — ABNORMAL LOW (ref >60)
GLUCOSE: 175 mg/dL — AB (ref 65–99)
POTASSIUM: 5.2 mmol/L — AB (ref 3.5–5.1)
Phosphorus: 9.4 mg/dL — ABNORMAL HIGH (ref 2.5–4.6)
Sodium: 143 mmol/L (ref 135–145)

## 2016-12-02 LAB — CBC
HEMATOCRIT: 37.4 % (ref 36.0–46.0)
Hemoglobin: 10.7 g/dL — ABNORMAL LOW (ref 12.0–15.0)
MCH: 26.2 pg (ref 26.0–34.0)
MCHC: 28.6 g/dL — AB (ref 30.0–36.0)
MCV: 91.7 fL (ref 78.0–100.0)
Platelets: 109 10*3/uL — ABNORMAL LOW (ref 150–400)
RBC: 4.08 MIL/uL (ref 3.87–5.11)
RDW: 22.1 % — AB (ref 11.5–15.5)
WBC: 24.9 10*3/uL — ABNORMAL HIGH (ref 4.0–10.5)

## 2016-12-02 MED ORDER — HALOPERIDOL LACTATE 2 MG/ML PO CONC
0.5000 mg | ORAL | Status: DC | PRN
Start: 2016-12-02 — End: 2016-12-03
  Filled 2016-12-02: qty 0.3

## 2016-12-02 MED ORDER — HALOPERIDOL LACTATE 5 MG/ML IJ SOLN: 0.5000 mg | INTRAMUSCULAR | Status: DC | PRN

## 2016-12-02 MED ORDER — LORAZEPAM 2 MG/ML PO CONC
0.5000 mg | ORAL | Status: DC | PRN
Start: 2016-12-02 — End: 2016-12-03

## 2016-12-02 MED ORDER — MORPHINE SULFATE (CONCENTRATE) 10 MG/0.5ML PO SOLN
5.0000 mg | ORAL | Status: DC | PRN
Start: 2016-12-02 — End: 2016-12-03
  Filled 2016-12-02: qty 0.5

## 2016-12-02 MED ORDER — LORAZEPAM 2 MG/ML IJ SOLN
0.5000 mg | INTRAMUSCULAR | Status: DC | PRN
Start: 2016-12-02 — End: 2016-12-03

## 2016-12-02 MED ORDER — GLYCOPYRROLATE 1 MG PO TABS: 1.0000 mg | ORAL_TABLET | ORAL | Status: DC | PRN

## 2016-12-02 MED ORDER — MORPHINE SULFATE (PF) 2 MG/ML IV SOLN: 1.0000 mg | INTRAVENOUS | Status: DC | PRN

## 2016-12-02 MED ORDER — GLYCOPYRROLATE 0.2 MG/ML IJ SOLN: 0.2000 mg | INTRAMUSCULAR | Status: DC | PRN

## 2016-12-02 MED ORDER — LORAZEPAM 0.5 MG PO TABS: 0.5000 mg | ORAL_TABLET | ORAL | Status: DC | PRN

## 2016-12-02 MED ORDER — GLYCOPYRROLATE 0.2 MG/ML IJ SOLN
0.2000 mg | INTRAMUSCULAR | Status: DC | PRN
  Administered 2016-12-02: 0.2 mg via INTRAVENOUS
  Filled 2016-12-02: qty 1

## 2016-12-02 MED ORDER — MORPHINE SULFATE (PF) 2 MG/ML IV SOLN
1.0000 mg | INTRAVENOUS | Status: DC | PRN
Start: 2016-12-02 — End: 2016-12-02

## 2016-12-02 MED ORDER — MORPHINE SULFATE (CONCENTRATE) 10 MG/0.5ML PO SOLN
5.0000 mg | ORAL | Status: DC | PRN
Start: 2016-12-02 — End: 2016-12-03
  Administered 2016-12-02: 5 mg via SUBLINGUAL

## 2016-12-02 MED ORDER — HALOPERIDOL 0.5 MG PO TABS
0.5000 mg | ORAL_TABLET | ORAL | Status: DC | PRN
  Filled 2016-12-02: qty 1

## 2016-12-02 NOTE — Progress Notes (Signed)
Patient ID: Carla [name], female   DOB: Jan 05, 1922, 83 y.o.   MRN: [id number]                                                                PROGRESS NOTE                                                                                                                                                                                                             Patient Demographics:    Carla [name], is a 83 y.o. female, DOB - June 27, 1922, WGN:[id number]  Admit date - 11/11/2016   Admitting Physician Coralyn Helling, MD  Outpatient Primary MD for the patient is Noel Journey  LOS - 4  Outpatient Specialists:    Chief Complaint  Patient presents with  . Altered Mental Status       Brief Narrative 83 yo female brought to ER with altered mental status.  She was walking toward her daughter and became suddenly cold, clammy, and weak. She sat down to rest, but eventually she had a recurrence of her sx and then became unresponsive. EMS was summoned, and intubated her for GCS 3.  She was dx with Rt leg DVT Feb 2017 for which she has an IVC filter. She has hx of recto-urethral fistula with intermittent bleeding.   Significant Events: 5/27 admit - intubated on arrival 5/28 extubated 5/30 [facility] assumed care   Subjective:    Carla [name] today has,not been eating much.  Poor po intake due to regurgitation due to hiatal hernia.  Pt desats when on removes her mask.  Pt is not very verbal.  Daughter is in the room answering questions.   No headache, No chest pain, No abdominal pain - No Nausea, No new weakness tingling or numbness   Assessment  & Plan :    Active Problems:   Acute respiratory failure (HCC)   Altered mental status   Severe sepsis (HCC)   SOB (shortness of breath)   Endotracheally intubated   Acute respiratory failure with hypoxia in setting of AMS/syncope  Likely related to aspiration in setting of AMS with very large hiatal hernia - d/c abx after 5 days    Syncope  TTE noted EF 60-65% w/ no WMA and grade 1 DD - LA appears  compressed by ?stomach - perhaps she suffered transient decreased CO due to LA compression - follow clinically for now   Hypotension  Resolved - etiology unclear - possible diminished CO due to extrinsic compression of LA by large HH?  Very large Hiatal Hernia - Esophageal dysphagia  GI following - CT abdom pending to better evaluate - this is a known chronic problem for her  Rt leg DVT w/ PE s/p filter Taken off Eliquis in March due to GIB  Acute renal failure - ATN No hydronephrosis on [place] - crt steadily climbing - ?ischemic insult/hypoperfusion at time of presentation - keep euvolemic - avoid nephrotoxic meds/contrast - suspect she is not a long term HD candidate but will discuss w/ family if crt continues to climb - UOP is falling  Creatinine worse today Pt not interested in HD We will consult palliative care/hospice, daughter is ok with hospice and trying to get her to [facility]  Chronic Afib Cont amiodarone - CHA2DS2 - VASc is 4 - not on anticoagulation due to recurrent GI bleeding  Recto-vaginal vesico-vaginal fistula  not a surgical candidate - followed at [facility]  Hypothyroidism Cont synthroid  DVT prophylaxis: SCDs Code Status: DNR - NO CODE  Family Communication: no family present at time of exam - spoke with daughter via phone Disposition Plan:  [place] if possible  Consultants:  [facility]  Antimicrobials:  Vancomycin 5/27 > 5/29 Zosyn 5/27 >       Lab Results  Component Value Date   PLT 109 (L) 12/02/2016     Anti-infectives    Start     Dose/Rate Route Frequency Ordered Stop   11/30/16 1600  vancomycin (VANCOCIN) IVPB 1000 mg/200 mL premix  Status:  Discontinued     1,000 mg 200 mL/hr over 60 Minutes Intravenous Every 24 hours 11/29/16 1027 11/30/16 1110   11/29/16 0030  piperacillin-tazobactam (ZOSYN) IVPB 2.25 g  Status:  Discontinued     2.25 g 100  mL/hr over 30 Minutes Intravenous Every 6 hours 12/23/2016 1811 12-23-2016 1858   12/23/16 1900  piperacillin-tazobactam (ZOSYN) IVPB 3.375 g  Status:  Discontinued     3.375 g 100 mL/hr over 30 Minutes Intravenous  Once 12/23/2016 1851 2016-12-23 1856   2016-12-23 1857  piperacillin-tazobactam (ZOSYN) IVPB 2.25 g     2.25 g 100 mL/hr over 30 Minutes Intravenous Every 6 hours Dec 23, 2016 1857 12/02/16 2359   12-23-2016 1600  vancomycin (VANCOCIN) 1,500 mg in sodium chloride 0.9 % 500 mL IVPB     1,500 mg 250 mL/hr over 120 Minutes Intravenous  Once 12/23/2016 1550 2016-12-23 1841   12/23/2016 1600  piperacillin-tazobactam (ZOSYN) IVPB 3.375 g     3.375 g 100 mL/hr over 30 Minutes Intravenous  Once 12/23/2016 1550 12-23-2016 1642        Objective:   Vitals:   12/01/16 2103 12/02/16 0012 12/02/16 0500 12/02/16 0526  BP: (!) 119/48 (!) 117/50  (!) 109/36  Pulse: 85 86  75  Resp: (!) 30 (!) 25  19  Temp: 97.9 F (36.6 C) 97.6 F (36.4 C)  97.5 F (36.4 C)  TempSrc: Axillary Axillary  Axillary  SpO2: (!) 26% 91%  99%  Weight:   89.9 kg (198 lb 3.2 oz)   Height:        Wt Readings from Last 3 Encounters:  12/02/16 89.9 kg (198 lb 3.2 oz)  11/22/16 75.8 kg (167 lb)  10/19/16 76.7 kg (169 lb)     Intake/Output  Summary (Last 24 hours) at 12/02/16 0734 Last data filed at 12/02/16 0521  Gross per 24 hour  Intake          1698.33 ml  Output                3 ml  Net          1695.33 ml     Physical Exam  Awake Alert, Oriented X 3, No new F.N deficits, Normal affect [place].AT,PERRAL Supple Neck,No JVD, No cervical lymphadenopathy appriciated.  Symmetrical Chest wall movement, Good air movement bilaterally, slight crackles bilateral base, no wheezing RRR,No Gallops,Rubs or new Murmurs, No Parasternal Heave +ve B.Sounds, Abd Soft, No tenderness, No organomegaly appriciated, No rebound - guarding or rigidity. No Cyanosis, Clubbing or edema, No new Rash or bruise      Data Review:    CBC  Recent  Labs Lab 22-Dec-2016 1530  12-22-16 1859 11/29/16 0421 11/30/16 0208 12/01/16 0418 12/02/16 0309  WBC 19.6*  --  28.7* 24.6* 28.4* 25.6* 24.9*  HGB 12.4  < > 13.2 13.5 12.1 10.5* 10.7*  HCT 42.6  < > 45.5 45.8 40.0 34.8* 37.4  PLT 169  --  175 135* 118* 107* 109*  MCV 92.2  --  94.4 95.0 91.3 89.7 91.7  MCH 26.8  --  27.4 28.0 27.6 27.1 26.2  MCHC 29.1*  --  29.0* 29.5* 30.3 30.2 28.6*  RDW 21.2*  --  21.7* 21.6* 22.4* 22.6* 22.1*  LYMPHSABS 4.5*  --   --   --   --   --   --   MONOABS 1.8*  --   --   --   --   --   --   EOSABS 0.2  --   --   --   --   --   --   BASOSABS 0.0  --   --   --   --   --   --   < > = values in this interval not displayed.  Chemistries   Recent Labs Lab 12-22-16 1530  12/22/16 1859 11/29/16 0421 11/30/16 0208 11/30/16 2020 12/01/16 0418 12/02/16 0309  NA 138  < > 136 144 143 142 144 143  K 5.4*  < > 5.3* 5.0 5.1 5.4* 4.9 5.2*  CL 107  < > 111 116* 109 107 106 103  CO2 17*  --  12* 14* 21* 20* 25 27  GLUCOSE 207*  < > 219* 116* 115* 126* 111* 175*  BUN 23*  < > 24* 29* 42* 51* 56* 64*  CREATININE 1.89*  < > 2.06* 2.40* 3.61* 4.14* 4.31* 4.82*  CALCIUM 8.3*  --  7.7* 7.5* 7.3* 7.2* 7.2* 7.2*  MG  --   --   --  1.9 1.8  --   --   --   AST 46*  --  68*  --  56*  --   --   --   ALT 13*  --  26  --  26  --   --   --   ALKPHOS 63  --  65  --  47  --   --   --   BILITOT 0.4  --  0.7  --  0.7  --   --   --   < > = values in this interval not displayed. ------------------------------------------------------------------------------------------------------------------ No results for input(s): CHOL, HDL, LDLCALC, TRIG, CHOLHDL, LDLDIRECT in the last 72 hours.  Lab Results  Component Value  Date   HGBA1C 6.1 (H) 01/08/2013   ------------------------------------------------------------------------------------------------------------------ No results for input(s): TSH, T4TOTAL, T3FREE, THYROIDAB in the last 72 hours.  Invalid input(s):  FREET3 ------------------------------------------------------------------------------------------------------------------ No results for input(s): VITAMINB12, FOLATE, FERRITIN, TIBC, IRON, RETICCTPCT in the last 72 hours.  Coagulation profile  Recent Labs Lab 02-01-2017 1530 02-01-2017 2216  INR 1.20 1.31    No results for input(s): DDIMER in the last 72 hours.  Cardiac Enzymes  Recent Labs Lab 02-01-2017 1859  TROPONINI 0.03*   ------------------------------------------------------------------------------------------------------------------    Component Value Date/Time   BNP 295.4 (H) 08/15/2015 1545    Inpatient Medications  Scheduled Meds: . amiodarone  100 mg Oral Daily  . levothyroxine  75 mcg Oral QAC breakfast  . pantoprazole (PROTONIX) IV  40 mg Intravenous QHS   Continuous Infusions: . piperacillin-tazobactam (ZOSYN)  IV Stopped (12/02/16 0551)  .  sodium bicarbonate  infusion 1000 mL 50 mL/hr at 12/01/16 1414   PRN Meds:.acetaminophen, acetaminophen, albuterol, fentaNYL (SUBLIMAZE) injection, ondansetron (ZOFRAN) IV, Zinc Oxide  Micro Results Recent Results (from the past 240 hour(s))  Culture, blood (routine x 2)     Status: Abnormal   Collection Time: 02-01-2017  4:30 PM  Result Value Ref Range Status   Specimen Description BLOOD SITE NOT SPECIFIED  Final   Special Requests   Final    BOTTLES DRAWN AEROBIC AND ANAEROBIC Blood Culture results may not be optimal due to an inadequate volume of blood received in culture bottles   Culture  Setup Time   Final    GRAM POSITIVE RODS ANAEROBIC BOTTLE ONLY CRITICAL RESULT CALLED TO, READ BACK BY AND VERIFIED WITHJudye Bos: C. WELLES PHARMD, AT [phone] 11/29/16    Culture CLOSTRIDIUM PERFRINGENS (A)  Final   Report Status 11/30/2016 FINAL  Final  Blood Culture ID Panel (Reflexed)     Status: None   Collection Time: 02-01-2017  4:30 PM  Result Value Ref Range Status   Enterococcus species NOT DETECTED NOT DETECTED Final    Listeria monocytogenes NOT DETECTED NOT DETECTED Final   Staphylococcus species NOT DETECTED NOT DETECTED Final   Staphylococcus aureus NOT DETECTED NOT DETECTED Final   Streptococcus species NOT DETECTED NOT DETECTED Final   Streptococcus agalactiae NOT DETECTED NOT DETECTED Final   Streptococcus pneumoniae NOT DETECTED NOT DETECTED Final   Streptococcus pyogenes NOT DETECTED NOT DETECTED Final   Acinetobacter baumannii NOT DETECTED NOT DETECTED Final   Enterobacteriaceae species NOT DETECTED NOT DETECTED Final   Enterobacter cloacae complex NOT DETECTED NOT DETECTED Final   Escherichia coli NOT DETECTED NOT DETECTED Final   Klebsiella oxytoca NOT DETECTED NOT DETECTED Final   Klebsiella pneumoniae NOT DETECTED NOT DETECTED Final   Proteus species NOT DETECTED NOT DETECTED Final   Serratia marcescens NOT DETECTED NOT DETECTED Final   Haemophilus influenzae NOT DETECTED NOT DETECTED Final   Neisseria meningitidis NOT DETECTED NOT DETECTED Final   Pseudomonas aeruginosa NOT DETECTED NOT DETECTED Final   Candida albicans NOT DETECTED NOT DETECTED Final   Candida glabrata NOT DETECTED NOT DETECTED Final   Candida krusei NOT DETECTED NOT DETECTED Final   Candida parapsilosis NOT DETECTED NOT DETECTED Final   Candida tropicalis NOT DETECTED NOT DETECTED Final  MRSA PCR Screening     Status: None   Collection Time: 02-01-2017  6:52 PM  Result Value Ref Range Status   MRSA by PCR NEGATIVE NEGATIVE Final    Comment:        The GeneXpert MRSA Assay (FDA approved for NASAL specimens  only), is one component of a comprehensive MRSA colonization surveillance program. It is not intended to diagnose MRSA infection nor to guide or monitor treatment for MRSA infections.   Culture, blood (routine x 2)     Status: None (Preliminary result)   Collection Time: 11/08/2016  7:16 PM  Result Value Ref Range Status   Specimen Description BLOOD RIGHT HAND  Final   Special Requests IN PEDIATRIC BOTTLE  Blood Culture adequate volume  Final   Culture NO GROWTH 3 DAYS  Final   Report Status PENDING  Incomplete    Radiology Reports Ct Head Wo Contrast  Result Date: 11/21/2016 CLINICAL DATA:  Iron deficiency anemia.  ETT. EXAM: CT HEAD WITHOUT CONTRAST TECHNIQUE: Contiguous axial images were obtained from the base of the skull through the vertex without intravenous contrast. COMPARISON:  None. FINDINGS: Brain: No subdural, epidural, or subarachnoid hemorrhages identified. Ventricles and sulci are prominent. An infarct in the left occipital lobe has a chronic appearance. Scattered white matter changes. No acute cortical ischemia infarct is identified. No mass, mass effect, or midline shift. A left cerebellar infarct with encephalomalacia is chronic. The brainstem and basal cisterns are normal. Vascular: Calcified atherosclerosis seen in the intracranial carotid arteries. Skull: Normal. Negative for fracture or focal lesion. Sinuses/Orbits: No acute finding. Other: None. 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 No acute abnormality identified. Electronically Signed   By: Gerome Sam III M.D   On: 11/26/2016 15:49   Dg Esophagus  Addendum Date: 11/30/2016   ADDENDUM REPORT: 11/30/2016 16:34 ADDENDUM: Study discussed by telephone with Dr. Staci Acosta on 11/30/2016 at 1627 hours. Electronically Signed   By: Odessa Fleming M.D.   On: 11/30/2016 16:34   Result Date: 11/30/2016 CLINICAL DATA:  83 year old female with chronic but increased vomiting and regurgitation following p.o. intake. EXAM: ESOPHOGRAM/BARIUM SWALLOW TECHNIQUE: Single contrast examination was performed using  thin barium FLUOROSCOPY TIME:  Fluoroscopy Time:  2 minutes 6 seconds Radiation Exposure Index (if provided by the fluoroscopic device): Number of Acquired Spot Images: 0 COMPARISON:  chest CT 08/05/2016 and earlier. CT Abdomen and Pelvis 01/08/2013 and 08/19/2012 FINDINGS: Due to limited  mobility the study was performed as a single contrast exam with thin barium and the fluoroscopy table inclined. The patient drank an adequate quantity of thin barium which outlined a tortuous but widely patent cervical esophagus with mild corkscrew type configuration. A moderate size gastric hiatal hernia was demonstrated with about 50% of the stomach intrathoracic. This is in distinction to the CT in February at which point all but the gastric antrum appeared to be intrathoracic. The intrathoracic portion of the stomach was distended with contrast and fluid, but there was in frequent contrast transit to the intraabdominal portion of the stomach. Still, the intraabdominal portion was opacified with contrast and appear distended with fluid, but did not empty to the duodenum. Despite repositioning the patient several times (left decubitus, right decubitus), and multiple additional minutes of observation, gastric emptying did not occur. The patient then vomited a portion of the administered contrast. The patient then declined to continue the study. IMPRESSION: 1. Hiatal hernia with about 50% of the stomach in the chest and 50% below the diaphragm. This is a change in configuration since the recent Chest CT in February 2018. 2. Both gastric components appeared dilated, with little contrast transit from the intrathoracic stomach to the intraabdominal stomach, and no gastric emptying observed during the course of the study. This appearance resembles the  findings on 2014 CTs Abdomen and Pelvis. 3. Recommend follow-up CT Abdomen and Pelvis (oral and IV contrast preferred) to further evaluate the apparent gastric obstruction and to compare to the chest CT on 08/05/2016 and CT Abdomen and Pelvis on 08/19/2012. Electronically Signed: By: Odessa Fleming M.D. On: 11/30/2016 16:14   US Renal  Result Date: 11/30/2016 CLINICAL DATA:  Acute onset of generalized abdominal pain. Assess patency of renal veins. Initial encounter. EXAM:  RENAL/URINARY TRACT ULTRASOUND RENAL VENOUS DOPPLER ULTRASOUND COMPARISON:  CT of the abdomen and pelvis from 01/08/2013 FINDINGS: Right Kidney: Length: 9.0 cm. Mildly increased parenchymal echogenicity is noted. There is mild renal cortical thinning. No mass or hydronephrosis visualized. Left Kidney: Length: 9.0 cm. Mildly increased parenchymal echogenicity is noted. There is mild renal cortical thinning. A 2.3 cm cyst is noted at the upper pole of the left kidney. No hydronephrosis visualized. Bladder:  The bladder is decompressed and not well seen. RENAL VENOUS ULTRASOUND Right renal vein: The right renal vein appears patent, with grossly unremarkable spectral waveform. Left renal vein: The left renal vein appears patent, with grossly unremarkable spectral waveform. IMPRESSION: 1. Renal veins appear patent bilaterally. 2. No evidence of hydronephrosis. 3. Mildly increased renal parenchymal echogenicity may reflect medical renal disease. 4. Mild renal cortical thinning raises concern for chronic renal disease. 5. Left renal cyst noted. Electronically Signed   By: Roanna Raider M.D.   On: 11/30/2016 22:50   US Venous Img Lower Unilateral Right  Result Date: 11/22/2016 CLINICAL DATA:  83 year old female with right lower extremity pain for the past 2 days. Initial encounter. EXAM: Right LOWER EXTREMITY VENOUS DOPPLER ULTRASOUND TECHNIQUE: Gray-scale sonography with graded compression, as well as color Doppler and duplex ultrasound were performed to evaluate the lower extremity deep venous systems from the level of the common femoral vein and including the common femoral, femoral, profunda femoral, popliteal and calf veins including the posterior tibial, peroneal and gastrocnemius veins when visible. The superficial great saphenous vein was also interrogated. Spectral Doppler was utilized to evaluate flow at rest and with distal augmentation maneuvers in the common femoral, femoral and popliteal veins.  COMPARISON:  None. FINDINGS: Contralateral Common Femoral Vein: Respiratory phasicity is normal and symmetric with the symptomatic side. No evidence of thrombus. Normal compressibility. Common Femoral Vein: No evidence of thrombus. Normal compressibility, respiratory phasicity and response to augmentation. Saphenofemoral Junction: No evidence of thrombus. Normal compressibility and flow on color Doppler imaging. Profunda Femoral Vein: No evidence of thrombus. Normal compressibility and flow on color Doppler imaging. Femoral Vein: Thrombus within the distal right femoral vein. Popliteal Vein: Thrombus within the right popliteal vein. Calf Veins: Thrombus within visualized right calf veins. Superficial Great Saphenous Vein: Thrombus within the greater saphenous vein at the level of the knee. IMPRESSION: Deep venous thrombosis extending from the distal right femoral vein into the right popliteal vein and visualized right calf veins. There is also thrombus within right greater saphenous vein at the knee level. These results will be called to the ordering clinician or representative by the Radiologist Assistant, and communication documented in the PACS or zVision Dashboard. Electronically Signed   By: Lacy Duverney M.D.   On: 11/22/2016 17:48   [place] Art/ven Flow Abd Pelv Doppler Limited  Result Date: 11/30/2016 CLINICAL DATA:  Acute onset of generalized abdominal pain. Assess patency of renal veins. Initial encounter. EXAM: RENAL/URINARY TRACT ULTRASOUND RENAL VENOUS DOPPLER ULTRASOUND COMPARISON:  CT of the abdomen and pelvis from 01/08/2013 FINDINGS: Right Kidney: Length: 9.0  cm. Mildly increased parenchymal echogenicity is noted. There is mild renal cortical thinning. No mass or hydronephrosis visualized. Left Kidney: Length: 9.0 cm. Mildly increased parenchymal echogenicity is noted. There is mild renal cortical thinning. A 2.3 cm cyst is noted at the upper pole of the left kidney. No hydronephrosis visualized.  Bladder:  The bladder is decompressed and not well seen. RENAL VENOUS ULTRASOUND Right renal vein: The right renal vein appears patent, with grossly unremarkable spectral waveform. Left renal vein: The left renal vein appears patent, with grossly unremarkable spectral waveform. IMPRESSION: 1. Renal veins appear patent bilaterally. 2. No evidence of hydronephrosis. 3. Mildly increased renal parenchymal echogenicity may reflect medical renal disease. 4. Mild renal cortical thinning raises concern for chronic renal disease. 5. Left renal cyst noted. Electronically Signed   By: Roanna Raider M.D.   On: 11/30/2016 22:50   Dg Chest Port 1 View  Result Date: 11/29/2016 CLINICAL DATA:  Check endotracheal tube position EXAM: PORTABLE CHEST 1 VIEW COMPARISON:  11/18/2016 FINDINGS: Endotracheal tube is noted 1 cm above the carina. Nasogastric catheter is noted over the mid chest just above the large hiatal hernia. A Large hiatal hernia is seen. Cardiac shadow is stable. No focal infiltrate or sizable effusion is seen. No bony abnormality is noted. IMPRESSION: Tubes and lines as described. Large hiatal hernia stable from the previous exam. Electronically Signed   By: Alcide Clever M.D.   On: 11/29/2016 07:26   Dg Chest Port 1 View  Result Date: 11/11/2016 CLINICAL DATA:  83 year old female status post intubation. EXAM: PORTABLE CHEST 1 VIEW COMPARISON:  Chest x-ray 08/24/2014. FINDINGS: An endotracheal tube is in place with tip 2.0 cm above the carina. Lung volumes are low. No consolidative airspace disease. No pleural effusions. No suspicious appearing pulmonary nodules or masses. No pneumothorax. Large hiatal hernia. Heart size is normal. Aortic atherosclerosis. Upper mediastinal contours are within normal limits. IMPRESSION: 1. Tip of endotracheal tube is 2 cm above the carina and could be withdrawn approximately 2 cm for more optimal placement. 2. Aortic atherosclerosis. 3. Large hiatal hernia. Electronically Signed    By: Trudie Reed M.D.   On: 11/25/2016 16:02    Time Spent in minutes  30   Pearson Grippe M.D on 12/02/2016 at 7:34 AM  Between 7am to 7pm - Pager - [phone]  After 7pm go to [email or web address] - password [facility]  [facility] -  Office  [phone]

## 2016-12-02 NOTE — Consult Note (Signed)
[facility] [employer]  Received request from [place] for family interest in [facility]. Chart reviewed and met with daughter Carla [name] at bedside to confirm interest and explain services including MD coverage and financial charges. Carla [name] is aware [facility] does not have a room to offer today and the a hospital liaison will follow up tomorrow. CSW Evelena Peat aware also.   Thank you,  Erling Conte, LCSW [phone]

## 2016-12-02 NOTE — Consult Note (Signed)
Consultation Note Date: 12/02/2016   Patient Name: Carla [name]  DOB: 06-27-1922  MRN: [id number]  Age / Sex: 83 y.o., female  PCP: Noel Journey Referring Physician: Pearson Grippe, MD  Reason for Consultation: Establishing goals of care, Non pain symptom management, Pain control and Terminal Care  HPI/Patient Profile: 83 y.o. female  with past medical history of a.fib, CKD, hyperlipidemia, hiatal hernia EGD in 2014 showed 2/3 of her stomach in her thoracic cavity, DVT w/ IVC filter, rectourethral fistula admitted on 12/01/2016 with AMS, weakness, unresponsive, requiring intubation. She was extubated on 5/28. Workup revealed sepsis and concern for intra-abdobminal infection. She also has had ongoing dysphagia for several weeks with coughing and dysphagia. Further workup during this admission has revealed  gastric outlet obstruction. On 5/30 she had episodes of vomiting and likely aspiration and respiratory decline. She also has acute renal failure d/t ATN- ?ischemic r/t hypoperfusion- no dialysis. After discussion with primary team her family has made decision for transition in care to comfort measures and request Hospice and placement at [facility].  Clinical Assessment and Goals of Care: Patient is in pain, not able to eat or drink. Agitated at times with non-rebreather. Discussed role of palliative her daughter, [name]. [name] and daughter [name] are nurses. Discussed differences in full aggressive therapy vs. Transition to comfort measures. Comfort measures include d/c IV fluids, antibiotics, administer medications for symptoms of pain, anxiety, SOB or other signs of discomfort. Explained philosphy of Hospice and care and [employer]. [name] verbalized understanding and agreed that their primary GOC for their mother at this point is comfort and supporting her through death with dignity.   Primary Decision  Maker NEXT OF KIN patient's daughters [name] and [name]    SUMMARY OF RECOMMENDATIONS -DNR -Referral placed for BP -Comfort medications ordered -[name] would like to continue cardiac monitoring for now until pt d/c'd to BP, but understands this will be stopped with patient is discharged -Explained that goal will be to wean patient off non rebreather to [place] using medications for symptoms of SOB, anxiety    Code Status/Advance Care Planning:  DNR   Palliative Prophylaxis:   Frequent Pain Assessment  Additional Recommendations (Limitations, Scope, Preferences):  Full Comfort Care  Psycho-social/Spiritual:   Desire for further Chaplaincy support:no  Additional Recommendations: Education on Hospice  Prognosis:    < 2 weeks  Discharge Planning: Hospice facility  Primary Diagnoses: Present on Admission: . Acute respiratory failure (HCC)   I have reviewed the medical record, interviewed the patient and family, and examined the patient. The following aspects are pertinent.  Past Medical History:  Diagnosis Date  . Atrial fibrillation (HCC)   . Cataracts, bilateral   . Chronic kidney disease    chronic kidney disease  . Cystocele   . Diverticulosis   . Dysrhythmia    A- fib  . Encounter for fitting and adjustment of pessary 04/23/2002  . Female bladder prolapse   . Gallstones   . Hemorrhoids   . Hiatal hernia   .  High cholesterol   . Hyperkalemia   . Hyperlipidemia   . Hypothyroidism   . Iron deficiency anemia   . Leukocytosis   . Lordosis   . Osteoarthritis   . Osteoarthritis   . Osteoporosis   . Pneumonia    hx aspiration pneumonia  . Rectocele   . Renal insufficiency   . Scoliosis   . Urinary tract infection without hematuria    Social History   Social History  . Marital status: Widowed    Spouse name: N/A  . Number of children: 4  . Years of education: N/A   Occupational History  . Retired Chartered loss adjusterschoolteacher    Social History Main Topics  .  Smoking status: Never Smoker  . Smokeless tobacco: Never Used  . Alcohol use No  . Drug use: No  . Sexual activity: No   Other Topics Concern  . None   Social History Narrative   Widowed.  Lives alone.  Uses a cane/walker to ambulate but independent of ADLs.   Family History  Problem Relation Age of Onset  . Diabetes Father   . Leukemia Father   . Breast cancer Paternal Aunt   . Breast cancer Paternal Aunt   . Cancer Unknown        several family members on dad's side of family  . Heart failure Brother        Pt cannot confirm this history   Scheduled Meds: . pantoprazole (PROTONIX) IV  40 mg Intravenous QHS   Continuous Infusions: PRN Meds:.acetaminophen, acetaminophen, glycopyrrolate **OR** glycopyrrolate **OR** glycopyrrolate, haloperidol **OR** haloperidol **OR** haloperidol lactate, LORazepam **OR** LORazepam **OR** LORazepam, morphine injection, morphine CONCENTRATE **OR** morphine CONCENTRATE, ondansetron (ZOFRAN) IV, Zinc Oxide Medications Prior to Admission:  Prior to Admission medications   Medication Sig Start Date End Date Taking? Authorizing Provider  acetaminophen (TYLENOL) 650 MG CR tablet Take 650 mg by mouth every 8 (eight) hours as needed for pain.   Yes [provider]  amiodarone (PACERONE) 100 MG tablet Take 1 tablet (100 mg total) by mouth daily. 05/12/16  Yes SwazilandJordan, Peter M, MD  Calcium Carb-Cholecalciferol (CALCIUM-VITAMIN D) 600-400 MG-UNIT TABS Take 1 tablet by mouth 2 (two) times daily.    Yes [provider]  Ferrous Sulfate (IRON) 325 (65 Fe) MG TABS Take 1 Dose by mouth daily. Patient taking differently: Take 325 mg by mouth daily.  10/20/16  Yes Waldon MerlMartin, William C, PA-C  levothyroxine (SYNTHROID, LEVOTHROID) 75 MCG tablet Take 75 mcg by mouth daily before breakfast.   Yes [provider]  nitrofurantoin (MACRODANTIN) 50 MG capsule Take 50 mg by mouth daily with breakfast.  12/27/14  Yes [provider]  pantoprazole  (PROTONIX) 40 MG tablet Take 40 mg by mouth daily with breakfast.  01/10/13  Yes Dorothea OgleMyers, Iskra M, MD  phenylephrine-shark liver oil-mineral oil-petrolatum (PREPARATION H) 0.25-3-14-71.9 % rectal ointment Place 1 application rectally 2 (two) times daily as needed for hemorrhoids.   Yes [provider]  polyethylene glycol (MIRALAX / GLYCOLAX) packet Take 8.5 g by mouth daily as needed for mild constipation or moderate constipation.    Yes [provider]  witch hazel-glycerin (TUCKS) pad Apply 1 application topically as needed for hemorrhoids.   Yes [provider]   Allergies  Allergen Reactions  . Bactrim [Sulfamethoxazole-Trimethoprim] Other (See Comments)    Has chronic kidney disease.  This is not recommended by nephrologist.    . Contrast Media [Iodinated Diagnostic Agents] Other (See Comments)    Affected  her labs for her kidneys   . Nsaids Other (See Comments)    Has chronic kidney disease.  This is not recommended by nephrologist.   Review of Systems  Physical Exam  Vital Signs: BP (!) 111/47 (BP Location: Left Arm)   Pulse 71   Temp 97.5 F (36.4 C) (Axillary)   Resp (!) 199   Ht 5\' 2"  (1.575 m)   Wt 89.9 kg (198 lb 3.2 oz)   SpO2 98%   BMI 36.25 kg/m  Pain Assessment: No/denies pain POSS *See Group Information*: 1-Acceptable,Awake and alert Pain Score: 0-No pain   SpO2: SpO2: 98 % O2 Device:SpO2: 98 % O2 Flow Rate: .O2 Flow Rate (L/min): 13 L/min  IO: Intake/output summary:  Intake/Output Summary (Last 24 hours) at 12/02/16 1025 Last data filed at 12/02/16 1610  Gross per 24 hour  Intake          1458.33 ml  Output                0 ml  Net          1458.33 ml    LBM: Last BM Date: 11/29/16 Baseline Weight: Weight: 83.2 kg (183 lb 6.8 oz) Most recent weight: Weight: 89.9 kg (198 lb 3.2 oz)     Palliative Assessment/Data:   Flowsheet Rows     Most Recent Value  Intake Tab  Referral Department  Hospitalist  Unit at Time of  Referral  Cardiac/Telemetry Unit  Palliative Care Primary Diagnosis  Sepsis/Infectious Disease  Date Notified  12/02/16  Palliative Care Type  New Palliative care  Reason for referral  Clarify Goals of Care  Date of Admission  11/29/2016  # of days IP prior to Palliative referral  4  Clinical Assessment  Psychosocial & Spiritual Assessment  Palliative Care Outcomes      Thank you for this consult. Palliative medicine will continue to follow and assist as needed.   Time In: 0930 Time Out: 1040 Time Total: 80 mins Greater than 50%  of this time was spent counseling and coordinating care related to the above assessment and plan.  Signed by: Ocie Bob, AGNP-C Palliative Medicine    Please contact Palliative Medicine Team phone at [phone] for questions and concerns.  For individual provider: See Loretha Stapler

## 2016-12-02 NOTE — Care Management Important Message (Signed)
Important Message  Patient Details  Name: Carla [name] MRN: [id number] Date of Birth: 14-Oct-1921   Medicare Important Message Given:  Yes    Kyla BalzarineShealy, Siriyah Ambrosius Abena 12/02/2016, 11:29 AM

## 2016-12-02 NOTE — Progress Notes (Signed)
Patient ID: Carla FellerEarline S [name], female   DOB: Jan 27, 1922, 83 y.o.   MRN: [id number]  Palliative care plan noted [facility]. Agree with comfort care measures. Will sign off. Call if questions.

## 2016-12-02 NOTE — Clinical Social Work Note (Signed)
Clinical Social Work Assessment  Patient Details  Name: Carla [name] MRN: [id number] Date of Birth: April 13, 1922  Date of referral:  12/02/16               Reason for consult:  End of Life/Hospice, Facility Placement                Permission sought to share information with:  Family Supports Permission granted to share information::  No  Name::     Brynda Greathouse  Agency::     Relationship::  daughter  Contact Information:  home [phone], cell [phone]   Housing/Transportation Living arrangements for the past 2 months:  Single Family Home Source of Information:  Adult Children Patient Interpreter Needed:  None Criminal Activity/Legal Involvement Pertinent to Current Situation/Hospitalization:  No - Comment as needed Significant Relationships:  Adult Children Lives with:  Adult Children Do you feel safe going back to the place where you live?  No Need for family participation in patient care:  Yes (Comment)  Care giving concerns: Family is planning end of life care for patient. Palliative recommends residential hospice.    Social Worker assessment / plan: CSW met with patient and family at bedside. Family prefers residential hospice at [facility]. Family will choose hospice in [facility] if necessary. CSW will make referral to [facility] and alternate hospice if needed. CSW will continue to follow and support with discharge.  Employment status:  Retired Nurse, adult PT Recommendations:  Not assessed at this time Information / Referral to community resources:  Other (Comment Required) (residential hospice)  Patient/Family's Response to care: Family are understanding of CSW role and discharge planning process. Family appreciative of patient's care.  Patient/Family's Understanding of and Emotional Response to Diagnosis, Current Treatment, and Prognosis:  Family with good understanding of patient's prognosis and planning for end of life.  Family agreeable to residential hospice placement in hopes of providing comfortable end of life care.  Emotional Assessment Appearance:  Appears stated age Attitude/Demeanor/Rapport:  Unable to Assess (patient on non-rebreather) Affect (typically observed):  Unable to Assess (patient on non-rebreather) Orientation:  Oriented to Self, Oriented to Place, Oriented to Situation Alcohol / Substance use:  Not Applicable Psych involvement (Current and /or in the community):  No (Comment)  Discharge Needs  Concerns to be addressed:  Discharge Planning Concerns, Care Coordination Readmission within the last 30 days:  No Current discharge risk:  Dependent with Mobility, Other (end of life) Barriers to Discharge:  Hospice Bed not available, Continued Medical Work up   Estanislado Emms, LCSW 12/02/2016, 3:53 PM

## 2016-12-03 LAB — CULTURE, BLOOD (ROUTINE X 2)
CULTURE: NO GROWTH
SPECIAL REQUESTS: ADEQUATE

## 2016-12-03 DEATH — deceased

## 2016-12-10 ENCOUNTER — Encounter: Payer: Medicare Other | Admitting: Hematology

## 2017-01-02 NOTE — Discharge Summary (Signed)
Carla [name], is a 83 y.o. female  DOB Sep 13, 1921  MRN [id number].  Admission date:  11/27/2016  Admitting Physician  Coralyn HellingVineet Sood, MD  Discharge Date:  12/20/2016  Primary MD  Waldon MerlMartin, William C, PA-C    Admission Diagnosis  SOB (shortness of breath) [R06.02] Severe sepsis (HCC) [A41.9, R65.20] Altered mental status, unspecified altered mental status type [R41.82]   Discharge Diagnosis  SOB (shortness of breath) [R06.02] Severe sepsis (HCC) [A41.9, R65.20] Altered mental status, unspecified altered mental status type [R41.82]    Active Problems:   Acute respiratory failure (HCC)   Altered mental status   Severe sepsis (HCC)   SOB (shortness of breath)   Endotracheally intubated   Difficulty swallowing   Goals of care, counseling/discussion   Palliative care by specialist   Terminal care      Past Medical History:  Diagnosis Date  . Atrial fibrillation (HCC)   . Cataracts, bilateral   . Chronic kidney disease    chronic kidney disease  . Cystocele   . Diverticulosis   . Dysrhythmia    A- fib  . Encounter for fitting and adjustment of pessary 04/23/2002  . Female bladder prolapse   . Gallstones   . Hemorrhoids   . Hiatal hernia   . High cholesterol   . Hyperkalemia   . Hyperlipidemia   . Hypothyroidism   . Iron deficiency anemia   . Leukocytosis   . Lordosis   . Osteoarthritis   . Osteoarthritis   . Osteoporosis   . Pneumonia    hx aspiration pneumonia  . Rectocele   . Renal insufficiency   . Scoliosis   . Urinary tract infection without hematuria     Past Surgical History:  Procedure Laterality Date  . CATARACT EXTRACTION    . CHOLECYSTECTOMY    . COLONOSCOPY    . ENDOMETRIAL BIOPSY  09/2009  . ESOPHAGOGASTRODUODENOSCOPY    . ESOPHAGOGASTRODUODENOSCOPY N/A 01/09/2013   Procedure: ESOPHAGOGASTRODUODENOSCOPY (EGD);  Surgeon: Vertell NovakJames L Edwards Jr., MD;  [facility];  Service: Endoscopy;  Laterality: N/A;  . INTRAMEDULLARY (IM) NAIL INTERTROCHANTERIC Right 12/13/2015   Procedure: INTRAMEDULLARY (IM) NAIL INTERTROCHANTRIC;  Surgeon: Samson FredericBrian Swinteck, MD;  Location: [facility]S;  Service: Orthopedics;  Laterality: Right;  . RECTAL ULTRASOUND N/A 04/10/2014   Procedure: RECTAL ULTRASOUND;  Surgeon: Romie LeveeAlicia Thomas, MD;  Location: [facility] ENDOSCOPY;  Service: Endoscopy;  Laterality: N/A;        83 yo female brought to ER with altered mental status. She was walking towardher daughter and became suddenly cold, clammy, and weak. She sat down to rest, but eventually she had a recurrence of her sx and then became unresponsive. EMS was summoned, and intubated her for GCS 3. She was dx with Rt leg DVT Feb 2017 for which she has an IVC filter. She has hx of recto-urethral fistula with intermittent bleeding.   Significant Events: 5/27 admit - intubated on arrival 5/28 extubated 5/30 [facility] assumed care  Pt creatinine continues to increase, and pt and family  not in favor of HD.  After long discussion with daughter, decision made to make her CMO and get her to [facility].  Palliative was consulted and agreed with this.  No beds were available at [facility] and pt expired.  Pt likely expired from Arrythmia secondary to ARF.      Consults obtained - pccm, palliative, GI  Major procedures and Radiology Reports - PLEASE review detailed and final reports for all details, in brief -      Dg Esophagus  Addendum Date: 11/30/2016   ADDENDUM REPORT: 11/30/2016 16:34 ADDENDUM: Study discussed by telephone with Dr. Staci Acosta on 11/30/2016 at 1627 hours. Electronically Signed   By: Odessa Fleming M.D.   On: 11/30/2016 16:34   Result Date: 11/30/2016 CLINICAL DATA:  83 year old female with chronic but increased vomiting and regurgitation following p.o. intake. EXAM: ESOPHOGRAM/BARIUM SWALLOW TECHNIQUE: Single contrast examination was performed using  thin barium FLUOROSCOPY TIME:   Fluoroscopy Time:  2 minutes 6 seconds Radiation Exposure Index (if provided by the fluoroscopic device): Number of Acquired Spot Images: 0 COMPARISON:  chest CT 08/05/2016 and earlier. CT Abdomen and Pelvis 01/08/2013 and 08/19/2012 FINDINGS: Due to limited mobility the study was performed as a single contrast exam with thin barium and the fluoroscopy table inclined. The patient drank an adequate quantity of thin barium which outlined a tortuous but widely patent cervical esophagus with mild corkscrew type configuration. A moderate size gastric hiatal hernia was demonstrated with about 50% of the stomach intrathoracic. This is in distinction to the CT in February at which point all but the gastric antrum appeared to be intrathoracic. The intrathoracic portion of the stomach was distended with contrast and fluid, but there was in frequent contrast transit to the intraabdominal portion of the stomach. Still, the intraabdominal portion was opacified with contrast and appear distended with fluid, but did not empty to the duodenum. Despite repositioning the patient several times (left decubitus, right decubitus), and multiple additional minutes of observation, gastric emptying did not occur. The patient then vomited a portion of the administered contrast. The patient then declined to continue the study. IMPRESSION: 1. Hiatal hernia with about 50% of the stomach in the chest and 50% below the diaphragm. This is a change in configuration since the recent Chest CT in February 2018. 2. Both gastric components appeared dilated, with little contrast transit from the intrathoracic stomach to the intraabdominal stomach, and no gastric emptying observed during the course of the study. This appearance resembles the findings on 2014 CTs Abdomen and Pelvis. 3. Recommend follow-up CT Abdomen and Pelvis (oral and IV contrast preferred) to further evaluate the apparent gastric obstruction and to compare to the chest CT on 08/05/2016  and CT Abdomen and Pelvis on 08/19/2012. Electronically Signed: By: Odessa Fleming M.D. On: 11/30/2016 16:14   US Renal  Result Date: 11/30/2016 CLINICAL DATA:  Acute onset of generalized abdominal pain. Assess patency of renal veins. Initial encounter. EXAM: RENAL/URINARY TRACT ULTRASOUND RENAL VENOUS DOPPLER ULTRASOUND COMPARISON:  CT of the abdomen and pelvis from 01/08/2013 FINDINGS: Right Kidney: Length: 9.0 cm. Mildly increased parenchymal echogenicity is noted. There is mild renal cortical thinning. No mass or hydronephrosis visualized. Left Kidney: Length: 9.0 cm. Mildly increased parenchymal echogenicity is noted. There is mild renal cortical thinning. A 2.3 cm cyst is noted at the upper pole of the left kidney. No hydronephrosis visualized. Bladder:  The bladder is decompressed and not well seen. RENAL VENOUS ULTRASOUND Right renal vein: The right  renal vein appears patent, with grossly unremarkable spectral waveform. Left renal vein: The left renal vein appears patent, with grossly unremarkable spectral waveform. IMPRESSION: 1. Renal veins appear patent bilaterally. 2. No evidence of hydronephrosis. 3. Mildly increased renal parenchymal echogenicity may reflect medical renal disease. 4. Mild renal cortical thinning raises concern for chronic renal disease. 5. Left renal cyst noted. Electronically Signed   By: Roanna Raider M.D.   On: 11/30/2016 22:50   [place] Art/ven Flow Abd Pelv Doppler Limited  Result Date: 11/30/2016 CLINICAL DATA:  Acute onset of generalized abdominal pain. Assess patency of renal veins. Initial encounter. EXAM: RENAL/URINARY TRACT ULTRASOUND RENAL VENOUS DOPPLER ULTRASOUND COMPARISON:  CT of the abdomen and pelvis from 01/08/2013 FINDINGS: Right Kidney: Length: 9.0 cm. Mildly increased parenchymal echogenicity is noted. There is mild renal cortical thinning. No mass or hydronephrosis visualized. Left Kidney: Length: 9.0 cm. Mildly increased parenchymal echogenicity is noted. There is  mild renal cortical thinning. A 2.3 cm cyst is noted at the upper pole of the left kidney. No hydronephrosis visualized. Bladder:  The bladder is decompressed and not well seen. RENAL VENOUS ULTRASOUND Right renal vein: The right renal vein appears patent, with grossly unremarkable spectral waveform. Left renal vein: The left renal vein appears patent, with grossly unremarkable spectral waveform. IMPRESSION: 1. Renal veins appear patent bilaterally. 2. No evidence of hydronephrosis. 3. Mildly increased renal parenchymal echogenicity may reflect medical renal disease. 4. Mild renal cortical thinning raises concern for chronic renal disease. 5. Left renal cyst noted. Electronically Signed   By: Roanna Raider M.D.   On: 11/30/2016 22:50   Dg Chest Port 1 View  Result Date: 11/29/2016 CLINICAL DATA:  Check endotracheal tube position EXAM: PORTABLE CHEST 1 VIEW COMPARISON:  11/08/2016 FINDINGS: Endotracheal tube is noted 1 cm above the carina. Nasogastric catheter is noted over the mid chest just above the large hiatal hernia. A Large hiatal hernia is seen. Cardiac shadow is stable. No focal infiltrate or sizable effusion is seen. No bony abnormality is noted. IMPRESSION: Tubes and lines as described. Large hiatal hernia stable from the previous exam. Electronically Signed   By: Alcide Clever M.D.   On: 11/29/2016 07:26     Data Review   CBC w Diff:  Lab Results  Component Value Date   WBC 24.9 (H) 12/02/2016   HGB 10.7 (L) 12/02/2016   HGB 15.5 03/22/2014   HCT 37.4 12/02/2016   PLT 109 (L) 12/02/2016   LYMPHOPCT 23 11/02/2016   MONOPCT 9 11/26/2016   EOSPCT 1 11/09/2016   BASOPCT 0 12/02/2016    CMP:  Lab Results  Component Value Date   NA 143 12/02/2016   NA 143 01/02/2016   K 5.2 (H) 12/02/2016   CL 103 12/02/2016   CO2 27 12/02/2016   BUN 64 (H) 12/02/2016   BUN 16 01/02/2016   CREATININE 4.82 (H) 12/02/2016   CREATININE 1.14 (H) 07/11/2014   GLU 99 01/02/2016   PROT 6.4 (L)  11/30/2016   ALBUMIN 2.1 (L) 12/02/2016   BILITOT 0.7 11/30/2016   ALKPHOS 47 11/30/2016   AST 56 (H) 11/30/2016   ALT 26 11/30/2016  .   Total Time in preparing paper work, data evaluation and todays exam - 35 minutes  Pearson Grippe M.D on 12/28/2016 at 10:37 PM  [facility]   Office  [phone]

## 2017-01-02 DEATH — deceased

## 2017-02-09 ENCOUNTER — Encounter: Payer: Medicare Other | Admitting: Physician Assistant

## 2018-03-13 IMAGING — CT CT CHEST W/O CM
2 of 3 series · 15 of 36 positions shown, 18 images · non-contrast
Comparison: 08/15/2015

CLINICAL DATA: Eval ground glass opacities in Lt lung. No contrast
due to elevated kidney function

EXAM:
CT CHEST WITHOUT CONTRAST
TECHNIQUE: Multidetector CT imaging of the chest was performed following the
standard protocol without IV contrast.

[Series 2: thorax · axial · 0.69mm/px · z∈[-288,-36]mm · 12 of 148 slices shown, 15 images]
[im 11/148  mediastinal]
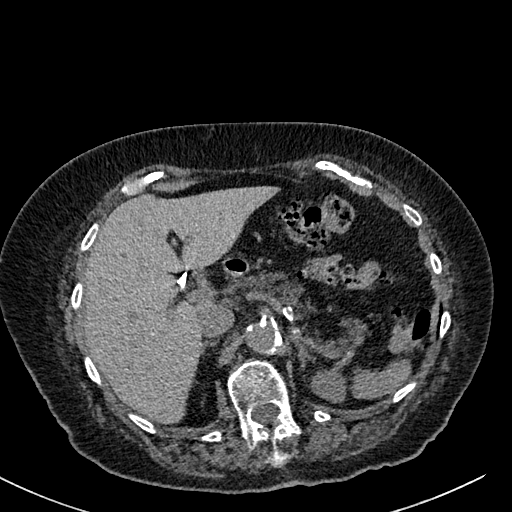
[im 11/148  lung]
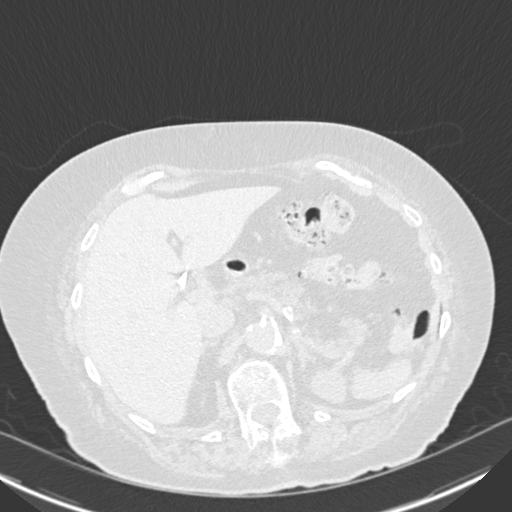
[im 22/148  lung]
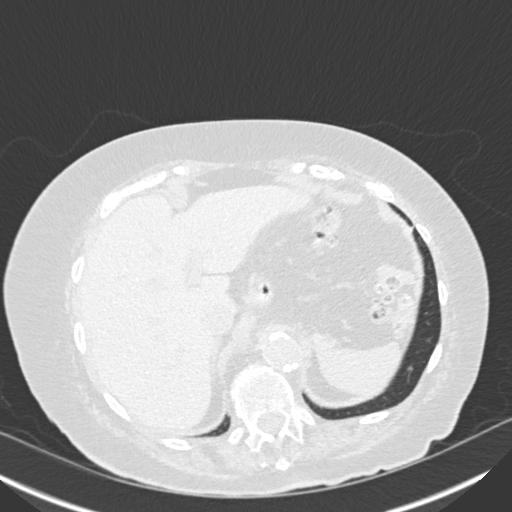
[im 33/148  lung]
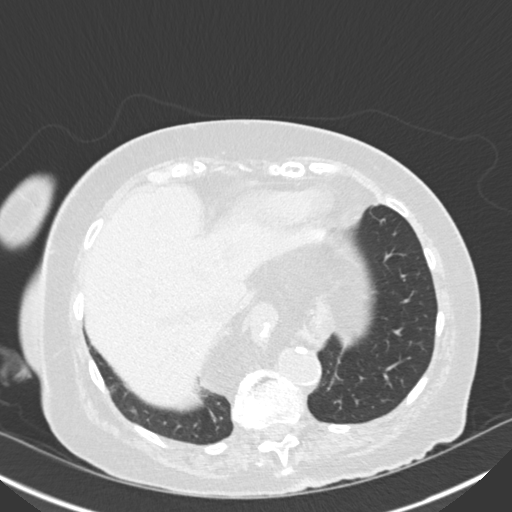
[im 44/148  lung]
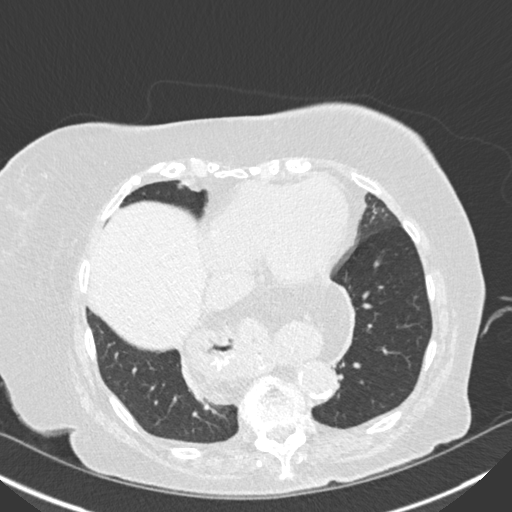
[im 55/148  mediastinal]
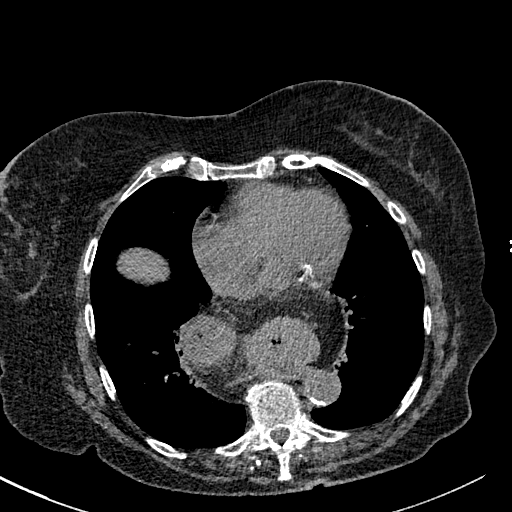
[im 55/148  lung]
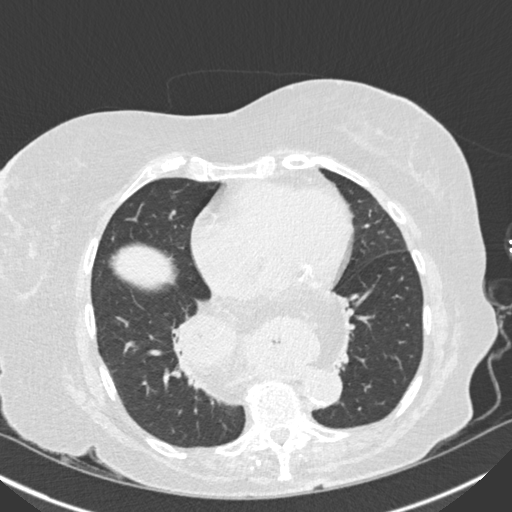
[im 66/148  lung]
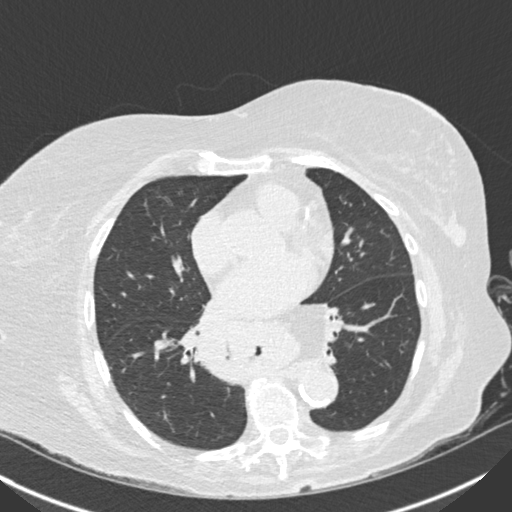
[im 82/148  lung]
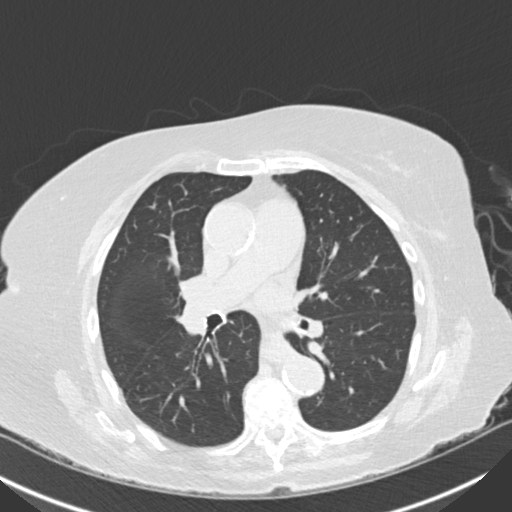
[im 93/148  lung]
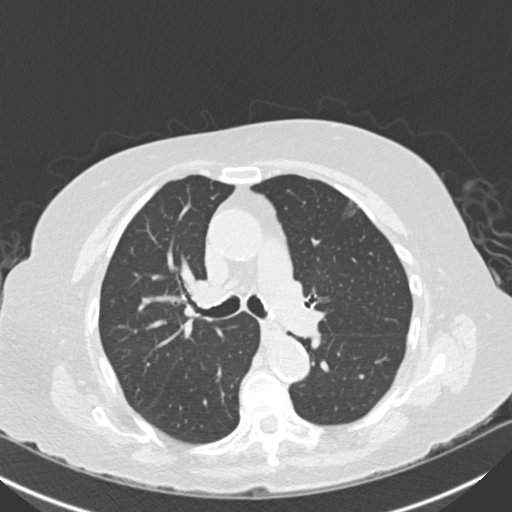
[im 104/148  mediastinal]
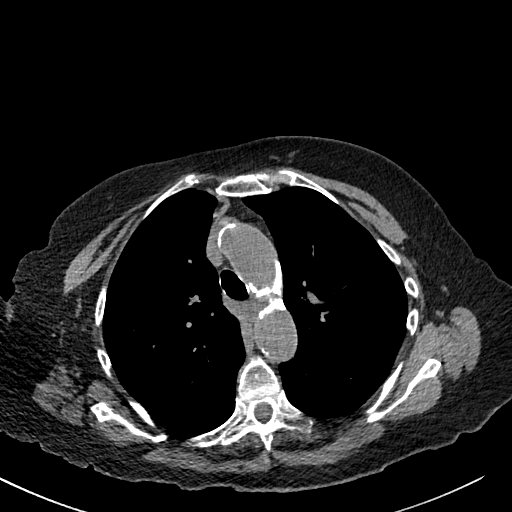
[im 104/148  lung]
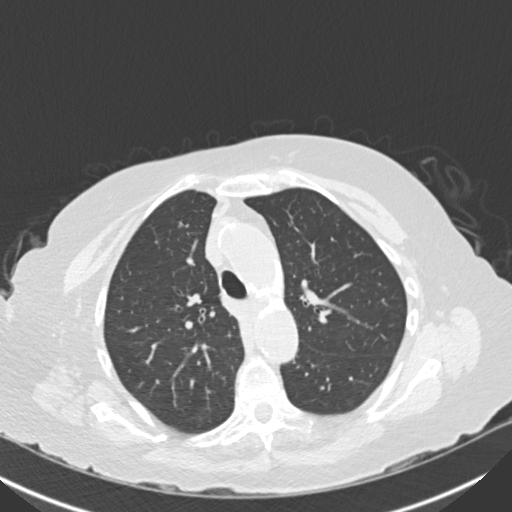
[im 115/148  lung]
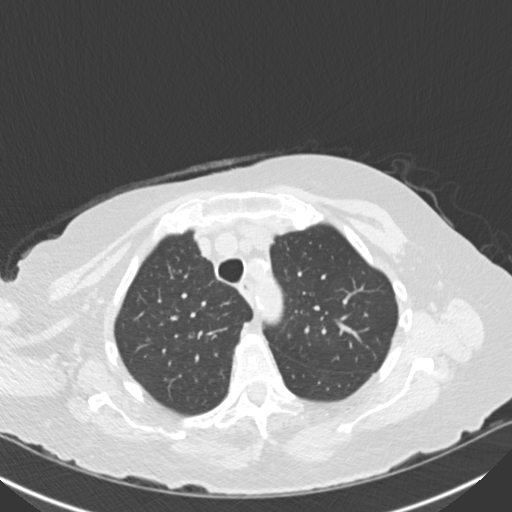
[im 126/148  lung]
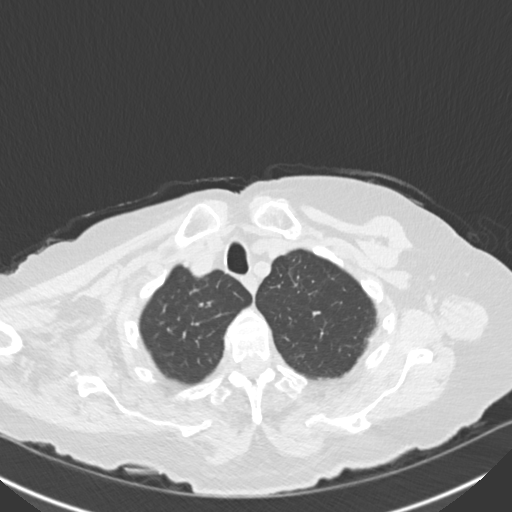
[im 137/148  lung]
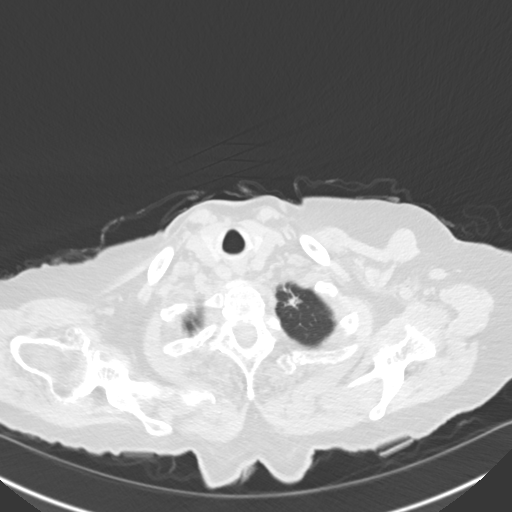

[Series 5: coronal · coronal · 0.50mm/px · 3 of 117 slices shown]
[im 24/117  lung]
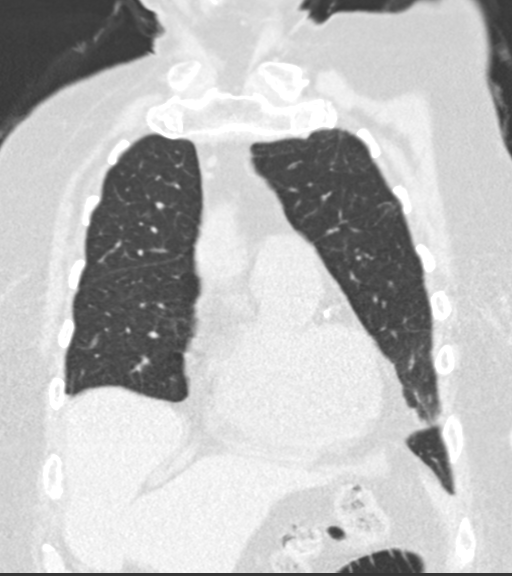
[im 47/117  lung]
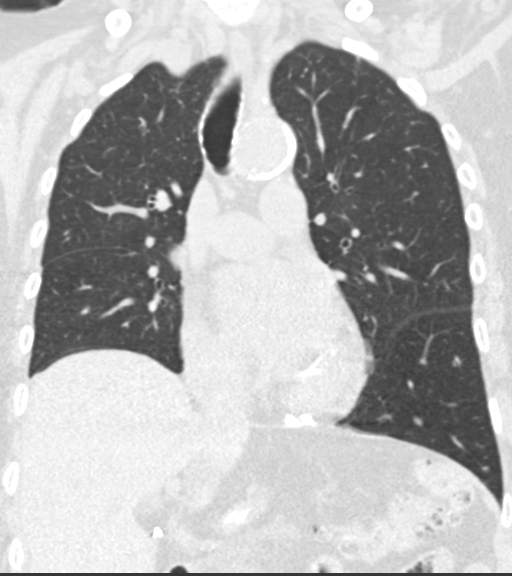
[im 70/117  lung]
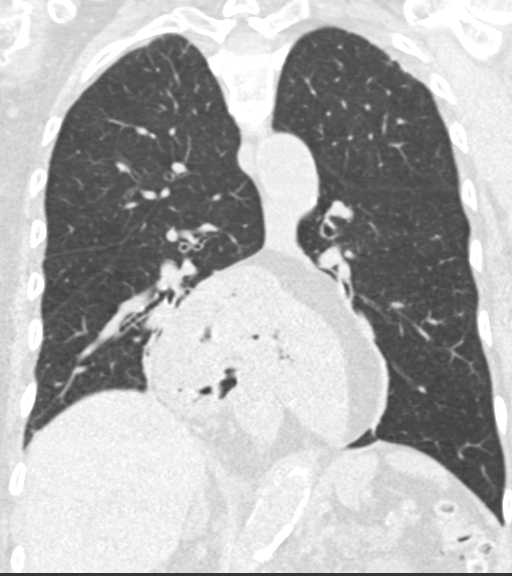

[15 of 36 positions shown; findings below may reference images not displayed]

FINDINGS: Neck base and axilla:  No mass or adenopathy.

Mediastinum and hila: Heart is normal in size and configuration.
There are moderate coronary artery calcifications. Calcified
atherosclerotic plaque is noted along the thoracic aorta and
proximal innominate artery and left subclavian artery. There is a
moderate size hiatal hernia. No mediastinal or hilar masses or
enlarged lymph nodes.

Lungs and pleura: Small area of irregular opacity with associated
mild bronchiectasis in the left upper lobe at the apex, centered on
image 14, series 3, measuring approximately 11 x 7 mm, is stable
from the most recent prior study. There is medial lower lobe opacity
abutting the medial pleura, which is stable consistent with
atelectasis. Some minor reticular scarring or atelectasis is noted
in both anterior lung bases. Subtle area of ground-glass opacity in
the left upper lobe, image 56, measuring 11 x 8 mm is smaller and
less apparent than on the prior CT. Stable calcified granuloma in
the lateral left lung base.

No pleural effusion.  No pneumothorax.

Limited upper abdomen: Low-density left upper pole renal mass
consistent with a cyst stable from an abdomen pelvis CT dated
01/08/2013. Status postcholecystectomy. No acute findings.

Musculoskeletal: Bones are demineralized. There are degenerative
disc changes along the thoracic spine. No osteoblastic or osteolytic
lesions.
IMPRESSION: 1. There are 2 areas of residual focal opacity in the left upper
lobe, 1 near the apex, which is most likely due to scarring, and the
other in the anterior left upper lobe more inferiorly, which has
mildly decreased in size and conspicuity since the prior CT. No new
lung abnormalities. Recommend additional followup CT, without
contrast, in 9 months to document longer term stability.

## 2018-03-22 IMAGING — CR DG CHEST 1V
1 series · 1 of 1 positions shown · non-contrast
Comparison: 08/15/2015

CLINICAL DATA: Fell, hip pain

EXAM:
CHEST 1 VIEW

[x chest ap]
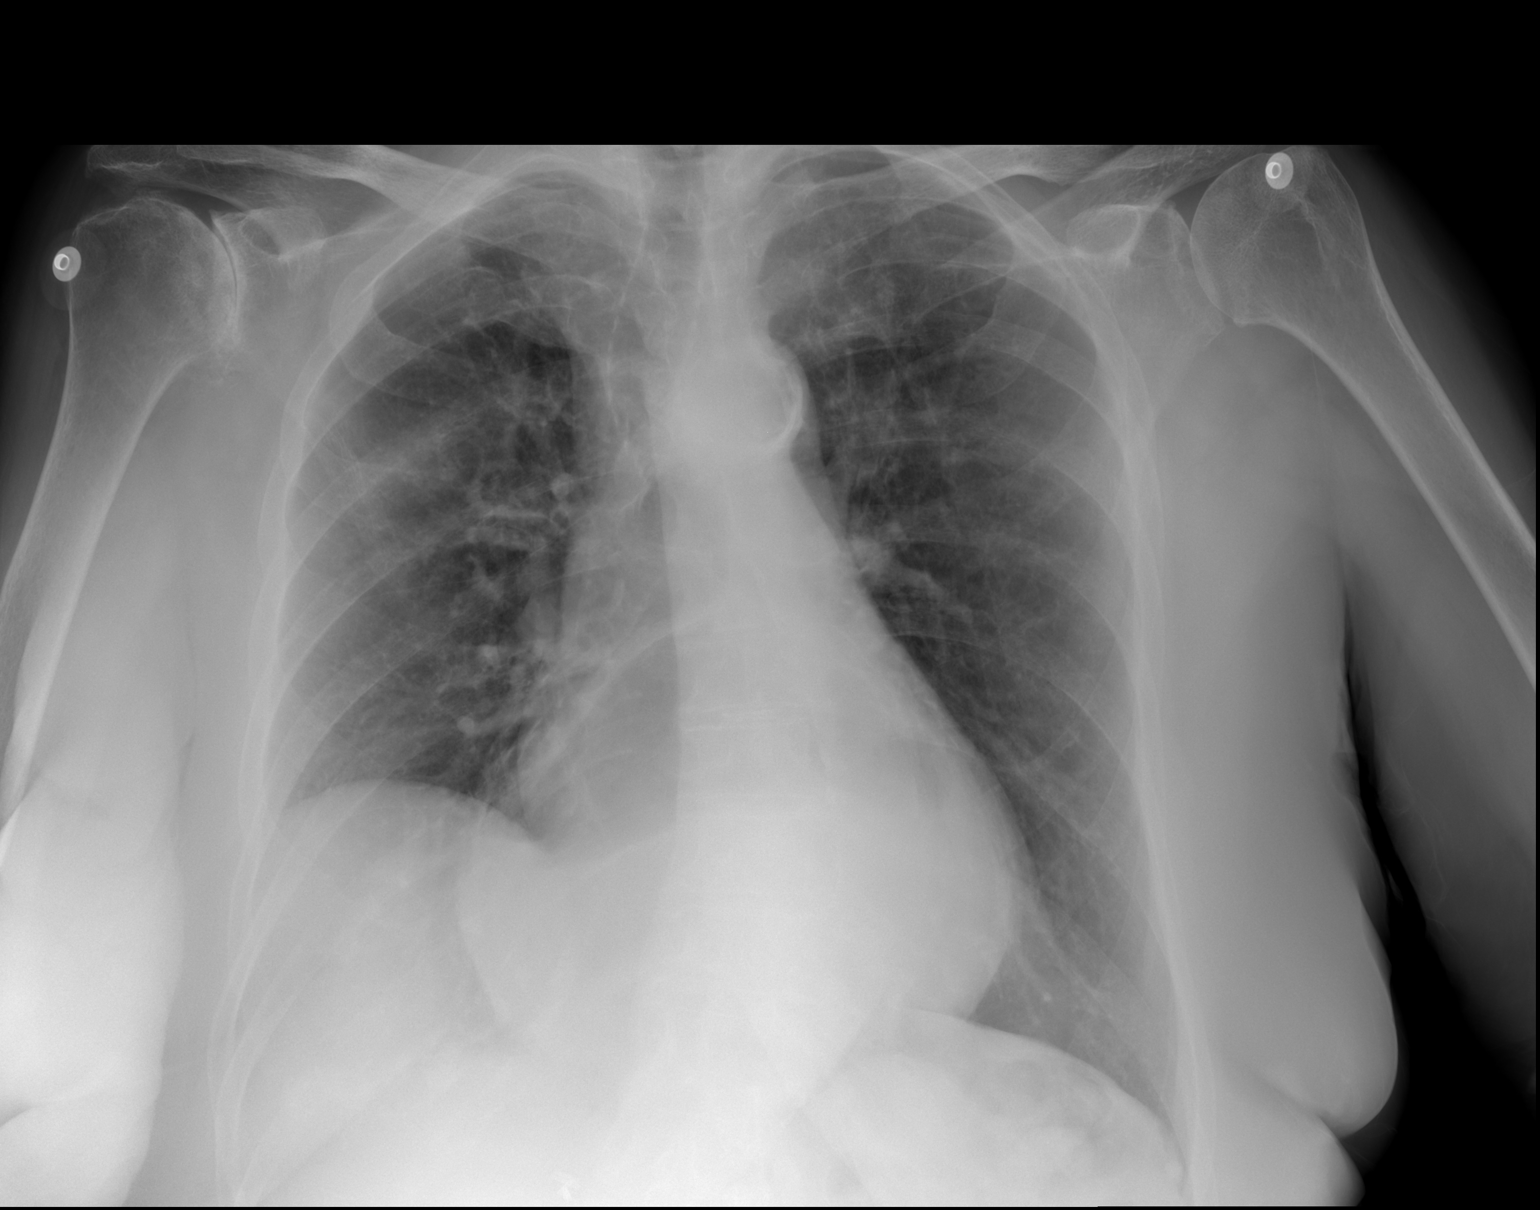

[1 of 1 positions shown; findings below may reference images not displayed]

FINDINGS: Large hiatal hernia. Heart size normal. Elevation of the right
diaphragm. Right greater than left shoulder arthropathy. Lungs
clear. No effusion.
IMPRESSION: Numerous chronic findings with no acute abnormality.

## 2018-03-22 IMAGING — CR DG PELVIS 1-2V
1 series · 1 of 1 positions shown · non-contrast
Comparison: None.

CLINICAL DATA: Fall, right hip pain

EXAM:
PELVIS - 1-2 VIEW

[x pelvis]
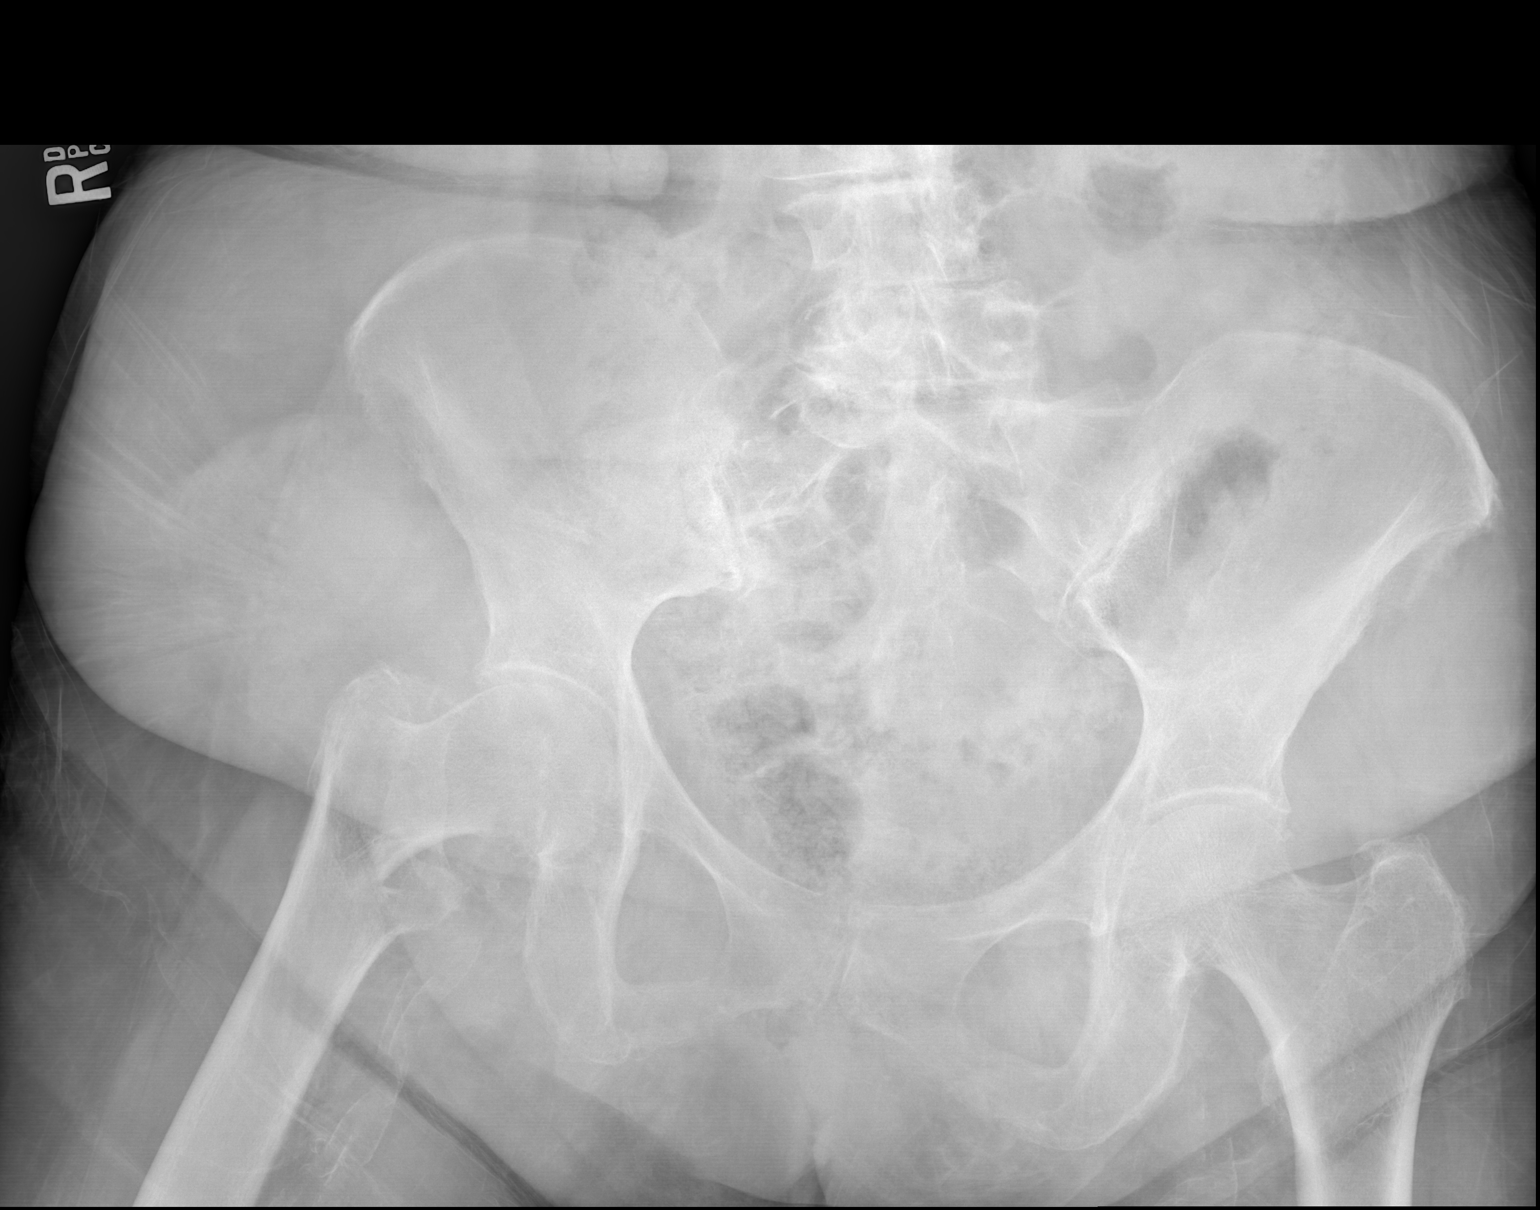

[1 of 1 positions shown; findings below may reference images not displayed]

FINDINGS: Intertrochanteric right femur fracture as described in report of
right femur. Pelvic bones intact.
IMPRESSION: Right femur fracture

## 2018-03-22 IMAGING — CR DG FEMUR 2+V*R*
1 series · 1 of 1 positions shown · non-contrast
Comparison: None.

CLINICAL DATA: Fall, femur pain

EXAM:
RIGHT FEMUR 2 VIEWS

[x pelvis]
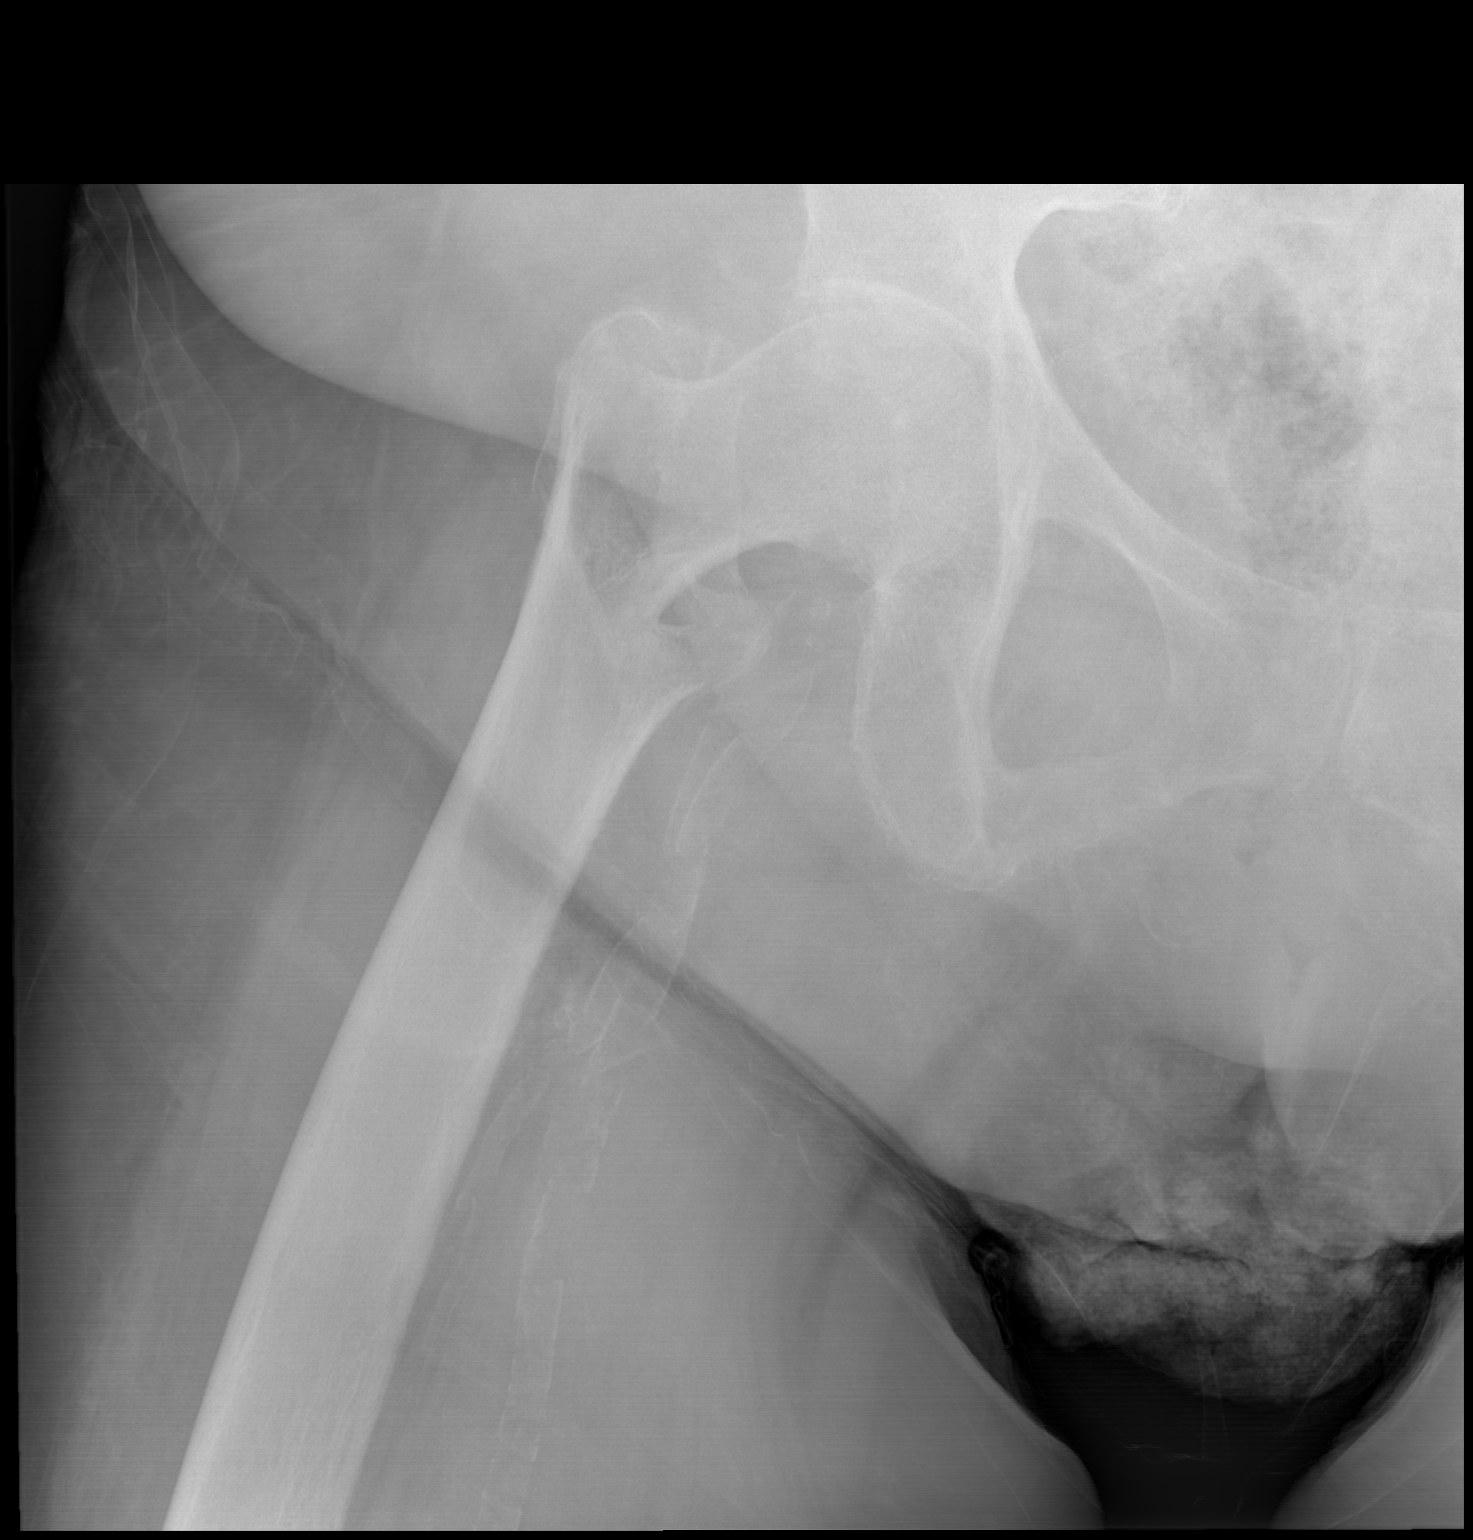

[1 of 1 positions shown; findings below may reference images not displayed]

FINDINGS: Mildly impacted intertrochanteric right femur fracture with mild
varus angulation. Extensive femoral artery calcification.
IMPRESSION: Acute femur fracture

## 2018-03-23 IMAGING — RF DG HIP (WITH PELVIS) OPERATIVE*R*
1 series · 6 of 6 positions shown · non-contrast
Comparison: 12/12/2015

CLINICAL DATA: [AGE] female with a history of hip fracture

EXAM:
OPERATIVE RIGHT HIP WITH PELVIS; DG C-ARM 1-60 MIN-NO REPORT

[Series 1: run · 6 of 6 slices shown]
[im 1/6]
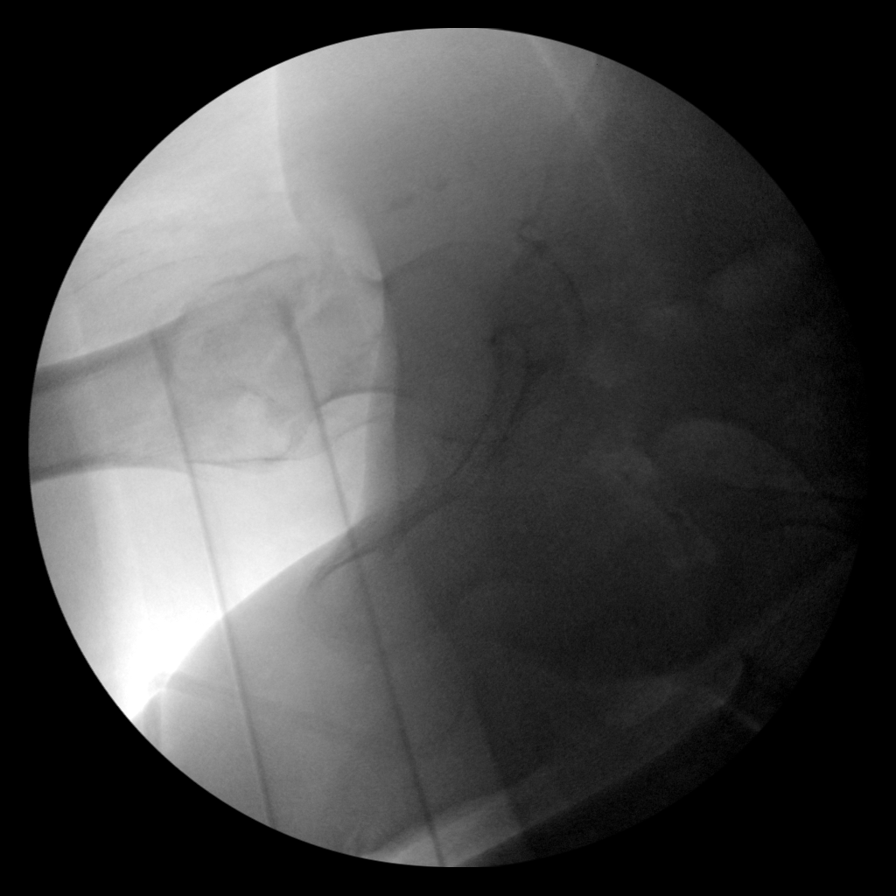
[im 2/6]
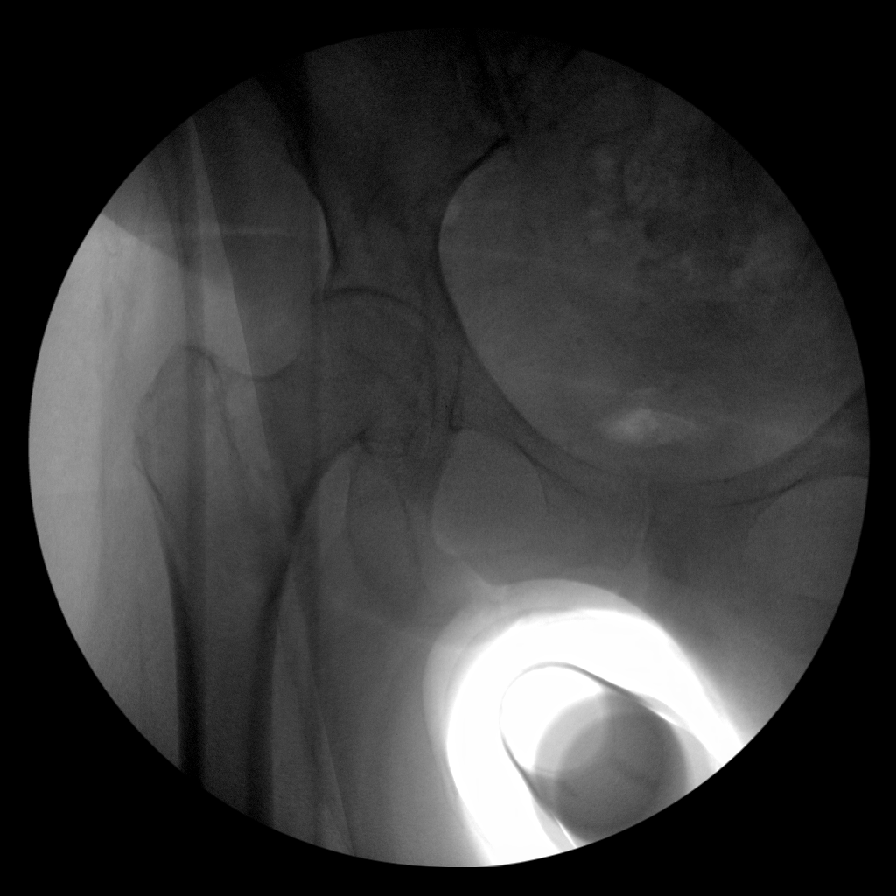
[im 3/6]
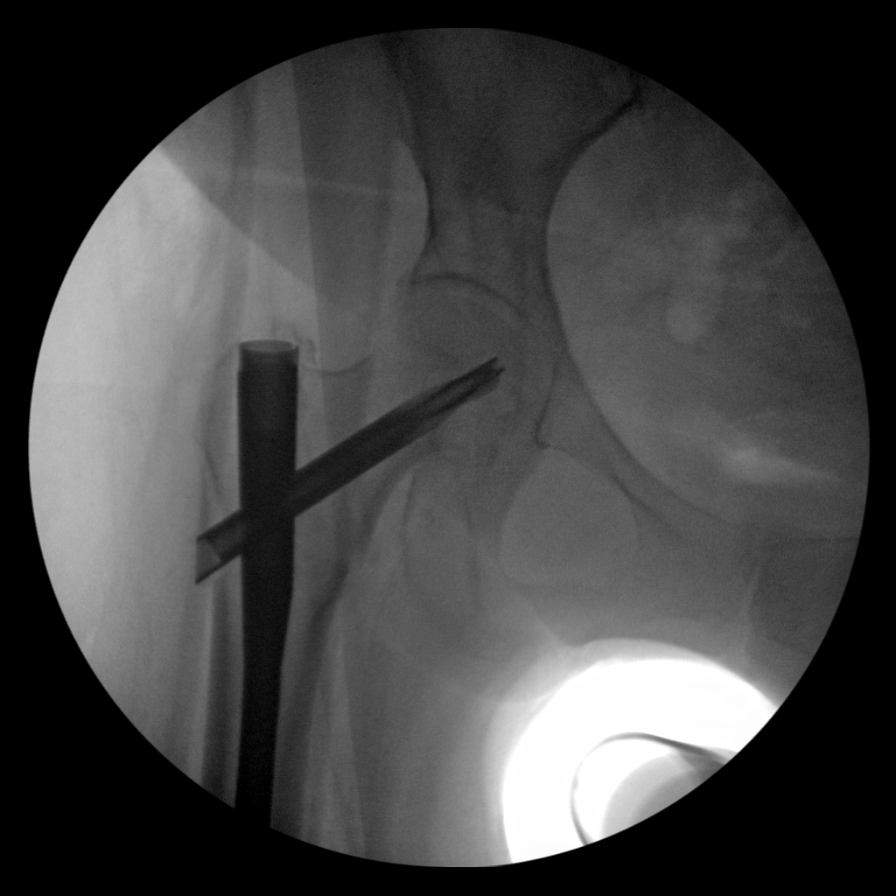
[im 4/6]
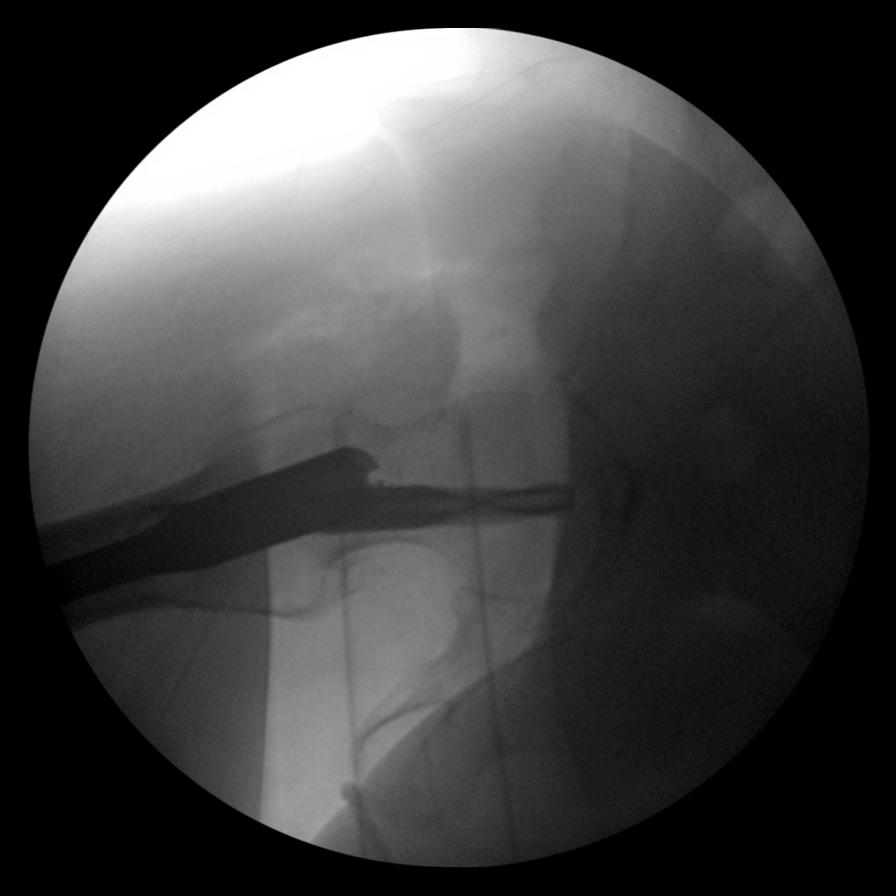
[im 5/6]
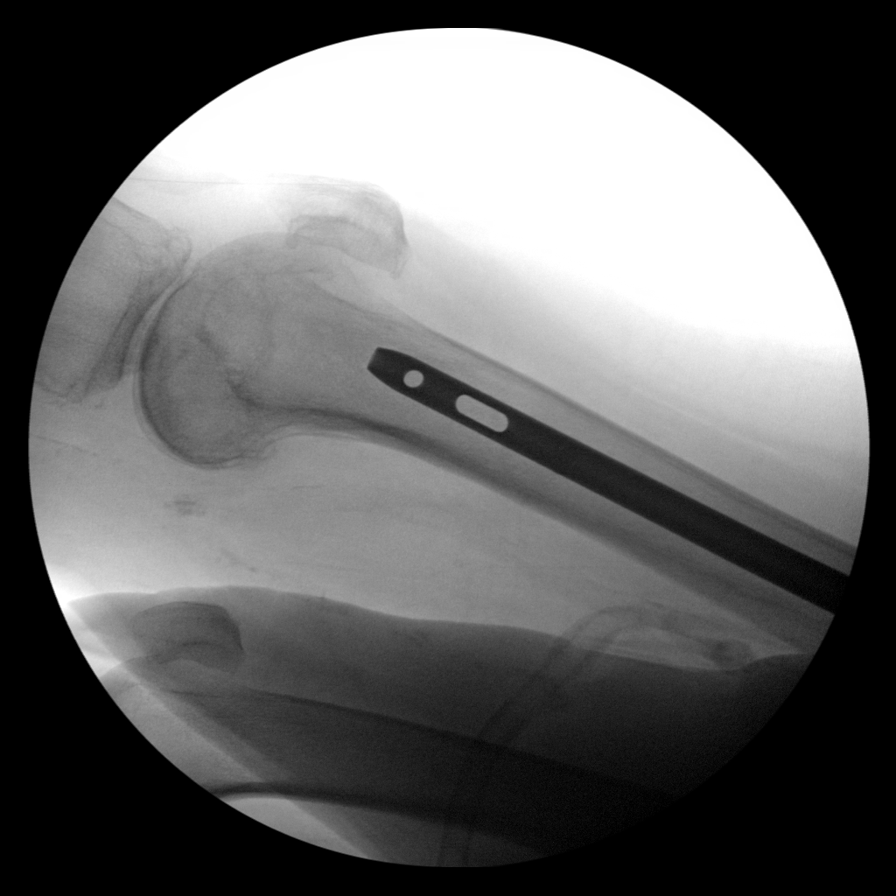
[im 6/6]
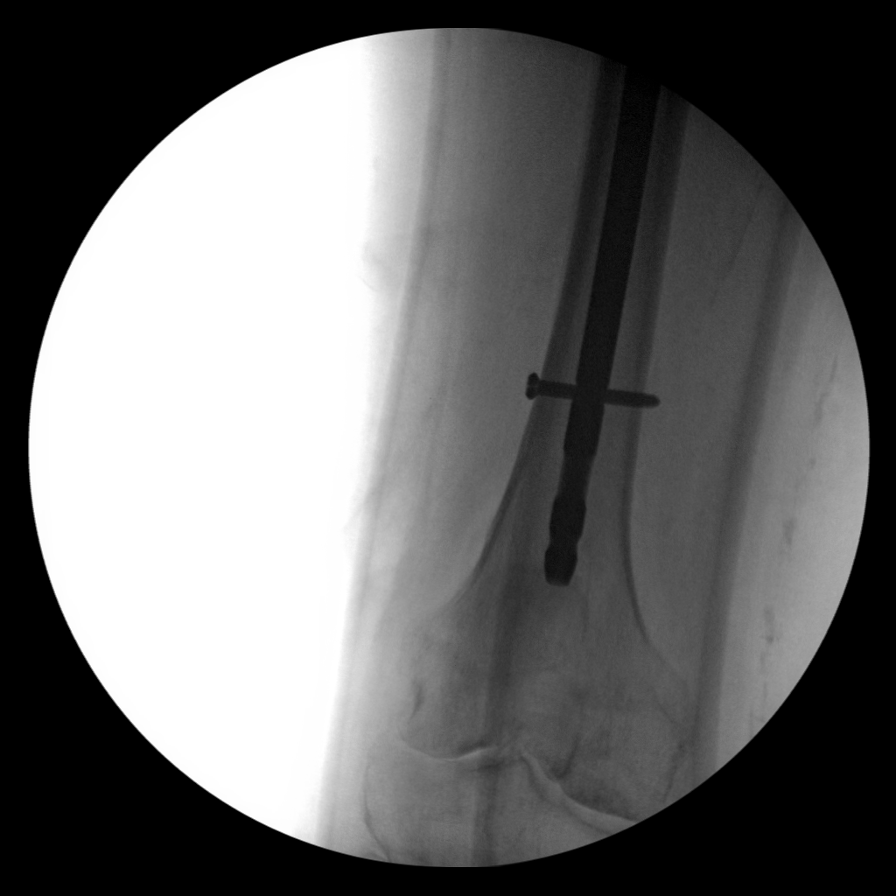

[6 of 6 positions shown; findings below may reference images not displayed]

FINDINGS: Multiple intraoperative fluoroscopic spot images during open
reduction internal fixation of right hip fracture demonstrates
placement of antegrade right femoral intra medullary rod with gamma
nail fixation of the femoral neck. No immediate
IMPRESSION: Intraoperative fluoroscopic spot images during open reduction
internal fixation of right femoral neck fracture, with antegrade
intra medullary rod placement and gamma nail fixation. Please refer
to the dictated operative report for full details of intraoperative
findings and procedure.
